# Patient Record
Sex: Male | Born: 1960
Health system: Southern US, Community
[De-identification: ages and names within clinical notes are randomized; demographics above are authoritative.]

## PROBLEM LIST (undated history)

## (undated) DIAGNOSIS — S4991XA Unspecified injury of right shoulder and upper arm, initial encounter: Secondary | ICD-10-CM

## (undated) DIAGNOSIS — J449 Chronic obstructive pulmonary disease, unspecified: Secondary | ICD-10-CM

## (undated) DIAGNOSIS — M1990 Unspecified osteoarthritis, unspecified site: Secondary | ICD-10-CM

## (undated) DIAGNOSIS — S82892A Other fracture of left lower leg, initial encounter for closed fracture: Secondary | ICD-10-CM

## (undated) DIAGNOSIS — J45909 Unspecified asthma, uncomplicated: Secondary | ICD-10-CM

## (undated) DIAGNOSIS — I499 Cardiac arrhythmia, unspecified: Secondary | ICD-10-CM

## (undated) DIAGNOSIS — C259 Malignant neoplasm of pancreas, unspecified: Secondary | ICD-10-CM

## (undated) DIAGNOSIS — M5430 Sciatica, unspecified side: Secondary | ICD-10-CM

## (undated) DIAGNOSIS — J189 Pneumonia, unspecified organism: Secondary | ICD-10-CM

## (undated) DIAGNOSIS — I4891 Unspecified atrial fibrillation: Secondary | ICD-10-CM

## (undated) HISTORY — DX: Pneumonia, unspecified organism: J18.9

## (undated) HISTORY — DX: Chronic obstructive pulmonary disease, unspecified: J44.9

## (undated) HISTORY — DX: Unspecified atrial fibrillation: I48.91

## (undated) HISTORY — DX: Unspecified asthma, uncomplicated: J45.909

## (undated) SURGERY — ERCP, WITH INTERVENTION IF INDICATED
Anesthesia: General | Laterality: Left

---

## 2000-03-29 HISTORY — PX: SHOULDER SURGERY: SHX246

## 2000-03-29 HISTORY — PX: KNEE SURGERY: SHX244

## 2005-07-04 ENCOUNTER — Inpatient Hospital Stay: Payer: Self-pay | Admitting: Otolaryngology

## 2009-03-29 DIAGNOSIS — J189 Pneumonia, unspecified organism: Secondary | ICD-10-CM

## 2009-03-29 HISTORY — DX: Pneumonia, unspecified organism: J18.9

## 2011-02-16 ENCOUNTER — Inpatient Hospital Stay: Payer: Self-pay | Admitting: Internal Medicine

## 2011-02-17 DIAGNOSIS — I059 Rheumatic mitral valve disease, unspecified: Secondary | ICD-10-CM

## 2011-02-17 DIAGNOSIS — I4891 Unspecified atrial fibrillation: Secondary | ICD-10-CM

## 2011-02-18 DIAGNOSIS — J984 Other disorders of lung: Secondary | ICD-10-CM

## 2011-03-17 ENCOUNTER — Ambulatory Visit: Payer: Self-pay | Admitting: "Endocrinology

## 2012-04-18 ENCOUNTER — Ambulatory Visit: Payer: Self-pay | Admitting: Adult Health

## 2012-11-23 DIAGNOSIS — J45909 Unspecified asthma, uncomplicated: Secondary | ICD-10-CM | POA: Insufficient documentation

## 2012-12-08 ENCOUNTER — Ambulatory Visit: Payer: Self-pay | Admitting: Adult Health

## 2013-11-28 ENCOUNTER — Ambulatory Visit: Payer: Self-pay | Admitting: Internal Medicine

## 2014-01-23 LAB — BASIC METABOLIC PANEL
BUN: 5 mg/dL (ref 4–21)
Creatinine: 0.6 mg/dL (ref 0.6–1.3)
Glucose: 93 mg/dL
Potassium: 5 mmol/L (ref 3.4–5.3)
Sodium: 139 mmol/L (ref 137–147)

## 2014-01-23 LAB — CBC AND DIFFERENTIAL
HEMATOCRIT: 52 % (ref 41–53)
HEMOGLOBIN: 18.4 g/dL — AB (ref 13.5–17.5)
NEUTROS ABS: 4 /uL
PLATELETS: 241 10*3/uL (ref 150–399)
WBC: 7.3 10^3/mL

## 2014-01-23 LAB — HEPATIC FUNCTION PANEL
ALK PHOS: 84 U/L (ref 25–125)
ALT: 19 U/L (ref 10–40)
AST: 25 U/L (ref 14–40)
Bilirubin, Total: 0.6 mg/dL

## 2014-01-23 LAB — LIPID PANEL
CHOLESTEROL: 206 mg/dL — AB (ref 0–200)
HDL: 98 mg/dL — AB (ref 35–70)
LDL CALC: 94 mg/dL
TRIGLYCERIDES: 70 mg/dL (ref 40–160)

## 2014-01-23 LAB — TSH: TSH: 0.47 u[IU]/mL (ref 0.41–5.90)

## 2014-07-24 LAB — PSA: PSA: 1.2

## 2014-07-31 ENCOUNTER — Ambulatory Visit: Payer: Self-pay | Admitting: Internal Medicine

## 2014-09-04 ENCOUNTER — Other Ambulatory Visit: Payer: Self-pay

## 2014-09-11 ENCOUNTER — Ambulatory Visit: Payer: Self-pay | Admitting: Internal Medicine

## 2015-01-28 ENCOUNTER — Ambulatory Visit: Payer: Self-pay

## 2015-01-28 DIAGNOSIS — J449 Chronic obstructive pulmonary disease, unspecified: Secondary | ICD-10-CM | POA: Insufficient documentation

## 2015-01-31 ENCOUNTER — Other Ambulatory Visit: Payer: Self-pay | Admitting: Internal Medicine

## 2015-01-31 DIAGNOSIS — R0602 Shortness of breath: Secondary | ICD-10-CM

## 2015-02-14 ENCOUNTER — Ambulatory Visit
Admission: RE | Admit: 2015-02-14 | Discharge: 2015-02-14 | Disposition: A | Discharge status: Home / Self Care | Payer: Self-pay | Source: Ambulatory Visit | Attending: Internal Medicine | Admitting: Internal Medicine

## 2015-02-14 ENCOUNTER — Ambulatory Visit
Admission: RE | Admit: 2015-02-14 | Discharge: 2015-02-14 | Disposition: A | Discharge status: Home / Self Care | Payer: PRIVATE HEALTH INSURANCE | Source: Ambulatory Visit | Attending: Internal Medicine | Admitting: Internal Medicine

## 2015-02-14 DIAGNOSIS — R0602 Shortness of breath: Secondary | ICD-10-CM

## 2015-02-14 DIAGNOSIS — F17200 Nicotine dependence, unspecified, uncomplicated: Secondary | ICD-10-CM | POA: Insufficient documentation

## 2015-02-14 DIAGNOSIS — J449 Chronic obstructive pulmonary disease, unspecified: Secondary | ICD-10-CM | POA: Insufficient documentation

## 2015-03-05 ENCOUNTER — Encounter (INDEPENDENT_AMBULATORY_CARE_PROVIDER_SITE_OTHER): Payer: Self-pay

## 2015-03-05 ENCOUNTER — Ambulatory Visit: Payer: PRIVATE HEALTH INSURANCE

## 2015-09-02 DIAGNOSIS — J45909 Unspecified asthma, uncomplicated: Secondary | ICD-10-CM

## 2015-09-02 DIAGNOSIS — J441 Chronic obstructive pulmonary disease with (acute) exacerbation: Secondary | ICD-10-CM

## 2016-03-30 ENCOUNTER — Encounter: Payer: Self-pay | Admitting: Emergency Medicine

## 2016-03-30 ENCOUNTER — Emergency Department: Payer: BLUE CROSS/BLUE SHIELD

## 2016-03-30 ENCOUNTER — Emergency Department
Admission: EM | Admit: 2016-03-30 | Discharge: 2016-03-30 | Disposition: A | Discharge status: Home / Self Care | Payer: BLUE CROSS/BLUE SHIELD | Attending: Emergency Medicine | Admitting: Emergency Medicine

## 2016-03-30 DIAGNOSIS — F17210 Nicotine dependence, cigarettes, uncomplicated: Secondary | ICD-10-CM | POA: Diagnosis not present

## 2016-03-30 DIAGNOSIS — W1839XA Other fall on same level, initial encounter: Secondary | ICD-10-CM | POA: Diagnosis not present

## 2016-03-30 DIAGNOSIS — J449 Chronic obstructive pulmonary disease, unspecified: Secondary | ICD-10-CM | POA: Diagnosis not present

## 2016-03-30 DIAGNOSIS — J45909 Unspecified asthma, uncomplicated: Secondary | ICD-10-CM | POA: Insufficient documentation

## 2016-03-30 DIAGNOSIS — S86011A Strain of right Achilles tendon, initial encounter: Secondary | ICD-10-CM | POA: Diagnosis not present

## 2016-03-30 DIAGNOSIS — Y939 Activity, unspecified: Secondary | ICD-10-CM | POA: Insufficient documentation

## 2016-03-30 DIAGNOSIS — S4991XA Unspecified injury of right shoulder and upper arm, initial encounter: Secondary | ICD-10-CM | POA: Diagnosis present

## 2016-03-30 DIAGNOSIS — Y999 Unspecified external cause status: Secondary | ICD-10-CM | POA: Insufficient documentation

## 2016-03-30 DIAGNOSIS — Y929 Unspecified place or not applicable: Secondary | ICD-10-CM | POA: Insufficient documentation

## 2016-03-30 DIAGNOSIS — M25512 Pain in left shoulder: Secondary | ICD-10-CM

## 2016-03-30 MED ORDER — NAPROXEN 500 MG PO TABS: 500.0000 mg | ORAL_TABLET | Freq: Two times a day (BID) | ORAL | Status: DC

## 2016-03-30 NOTE — ED Triage Notes (Signed)
Pt to ed with c/o back pain that radiates down right leg after falling on Saturday night.  Pt states he was reaching up and lost his balance and fell backwards.  Pt ambulates with limp at triage.

## 2016-03-30 NOTE — ED Provider Notes (Signed)
[facility] Emergency Department Provider Note   ____________________________________________   First MD Initiated Contact with Patient 03/30/16 1203     (approximate)  I have reviewed the triage vital signs and the nursing notes.   HISTORY  Chief Complaint Fall    HPI Benjamin Diaz. is a 56 y.o. male patient complain of left shoulder pain, back pain, and right leg pain secondary to a fall 3 days ago. Patient state he was reaching upward lost his balance and fell backwards. Patient states since the incident he has had pain with adduction and overhead reaching with her left upper extremity. Patient state pain in this leg did not start until yesterday. He describes the pain as a tenderness in his right calf. Patient stated pain increases with dorsal and plantar flexion of the foot. Patient rates overall pain as a 6/10. Patient had a pain as "achy". Patient denies bladder or bowel dysfunction. Patient denies radicular component to his pain.   Past Medical History:  Diagnosis Date  . Asthma   . Atrial fibrillation ([place])   . COPD (chronic obstructive pulmonary disease) ([place])   . Pneumonia     Patient Active Problem List   Diagnosis Date Noted  . COPD (chronic obstructive pulmonary disease) ([place]) 01/28/2015  . Asthma 11/23/2012    Past Surgical History:  Procedure Laterality Date  . KNEE SURGERY  2004  . SHOULDER SURGERY  2002    Prior to Admission medications   Medication Sig Start Date End Date Taking? Authorizing Provider  albuterol (PROVENTIL HFA) 108 (90 Base) MCG/ACT inhaler Inhale 2 puffs into the lungs every 4 (four) hours as needed for wheezing or shortness of breath.    Historical Provider, MD  budesonide-formoterol (SYMBICORT) 160-4.5 MCG/ACT inhaler Inhale 2 puffs into the lungs 2 (two) times daily.    Historical Provider, MD  naproxen (NAPROSYN) 500 MG tablet Take 1 tablet (500 mg total) by mouth 2 (two) times daily with a meal.  03/30/16   Sable Feil, PA-C    Allergies Patient has no known allergies.  Family History  Problem Relation Age of Onset  . Heart disease Father     Social History Social History  Substance Use Topics  . Smoking status: Current Every Day Smoker    Packs/day: 1.00    Years: 40.00    Types: Cigarettes  . Smokeless tobacco: Never Used  . Alcohol use 4.2 oz/week    7 Cans of beer per week    Review of Systems Constitutional: No fever/chills Eyes: No visual changes. ENT: No sore throat. Cardiovascular: Denies chest pain. Respiratory: Denies shortness of breath. Gastrointestinal: No abdominal pain.  No nausea, no vomiting.  No diarrhea.  No constipation. Genitourinary: Negative for dysuria. Musculoskeletal: Negative for back pain. Skin: Negative for rash. Neurological: Negative for headaches, focal weakness or numbness.    ____________________________________________   PHYSICAL EXAM:  VITAL SIGNS: ED Triage Vitals  Enc Vitals Group     BP 03/30/16 1043 (!) 158/95     Pulse Rate 03/30/16 1043 (!) 107     Resp 03/30/16 1043 20     Temp 03/30/16 1043 97.9 F (36.6 C)     Temp Source 03/30/16 1043 Oral     SpO2 03/30/16 1043 93 %     Weight 03/30/16 1044 125 lb (56.7 kg)     Height 03/30/16 1044 6\' 1"  (1.854 m)     Head Circumference --      Peak Flow --  Pain Score 03/30/16 1049 8     Pain Loc --      Pain Edu? --      Excl. in [place]? --     Constitutional: Alert and oriented. Well appearing and in no acute distress. Eyes: Conjunctivae are normal. PERRL. EOMI. Head: Atraumatic. Nose: No congestion/rhinnorhea. Mouth/Throat: Mucous membranes are moist.  Oropharynx non-erythematous. Neck: No stridor.  No cervical spine tenderness to palpation. Hematological/Lymphatic/Immunilogical: No cervical lymphadenopathy. Cardiovascular: Normal rate, regular rhythm. Grossly normal heart sounds.  Good peripheral circulation. Respiratory: Normal respiratory effort.  No  retractions. Lungs CTAB. Gastrointestinal: Soft and nontender. No distention. No abdominal bruits. No CVA tenderness. Musculoskeletal: No obvious deformity of the left shoulder. Decreased range of motion with abduction overhead reaching. Patient has some moderate guarding palpation of the mid scapular spine. No obvious deformity of the lumbar spine. Patient is nontender with palpation of the lumbar spine. Patient has full nuchal range of motion of the lumbar spine. Examination of the right leg shows no obvious deformity. Patient has some moderate guarding palpation of the calf muscle of the right leg. Patient has atypical gait secondary to complain of pain to the right leg.  Neurologic:  Normal speech and language. No gross focal neurologic deficits are appreciated. No gait instability. Skin:  Skin is warm, dry and intact. No rash noted. Psychiatric: Mood and affect are normal. Speech and behavior are normal.  ____________________________________________   LABS (all labs ordered are listed, but only abnormal results are displayed)  Labs Reviewed - No data to display ____________________________________________  EKG   ____________________________________________  RADIOLOGY  No acute findings x-ray of the left shoulder. ____________________________________________   PROCEDURES  Procedure(s) performed: None  Procedures  Critical Care performed: No  ____________________________________________   INITIAL IMPRESSION / ASSESSMENT AND PLAN / ED COURSE  Pertinent labs & imaging results that were available during my care of the patient were reviewed by me and considered in my medical decision making (see chart for details).  Left shoulder pain secondary to fall. Right Achilles strain secondary to stretching incident. Patient given discharge care instructions. Patient given a prescription for naproxen. Patient given a work note for 2 days. Patient advised follow-up family doctor  condition persists.  Clinical Course   Discussed x-ray finding with patient.   ____________________________________________   FINAL CLINICAL IMPRESSION(S) / ED DIAGNOSES  Final diagnoses:  Acute pain of left shoulder  Strain of right Achilles tendon, initial encounter      NEW MEDICATIONS STARTED DURING THIS VISIT:  New Prescriptions   NAPROXEN (NAPROSYN) 500 MG TABLET    Take 1 tablet (500 mg total) by mouth 2 (two) times daily with a meal.     Note:  This document was prepared using Dragon voice recognition software and may include unintentional dictation errors.    Sable Feil, PA-C 03/30/16 1247    Earleen Newport, MD 03/30/16 [phone]

## 2016-03-30 NOTE — ED Notes (Signed)
Pt reports that he fell Saturday on his back and now is having pain in right side of lower back that radiates into right leg - pt was standing straight and reached for something on a shelf and fell backwards

## 2016-03-30 NOTE — ED Notes (Signed)
Ace wrap applied to right lower leg 

## 2016-05-04 ENCOUNTER — Ambulatory Visit: Payer: Self-pay

## 2016-05-11 ENCOUNTER — Ambulatory Visit: Payer: Self-pay | Admitting: Adult Health Nurse Practitioner

## 2016-05-11 VITALS — BP 165/101 | HR 80 | Wt 117.6 lb

## 2016-05-11 DIAGNOSIS — Z0000 Encounter for general adult medical examination without abnormal findings: Secondary | ICD-10-CM

## 2016-05-11 DIAGNOSIS — M545 Low back pain, unspecified: Secondary | ICD-10-CM | POA: Insufficient documentation

## 2016-05-11 DIAGNOSIS — M5441 Lumbago with sciatica, right side: Secondary | ICD-10-CM

## 2016-05-11 DIAGNOSIS — J441 Chronic obstructive pulmonary disease with (acute) exacerbation: Secondary | ICD-10-CM

## 2016-05-11 DIAGNOSIS — M5442 Lumbago with sciatica, left side: Secondary | ICD-10-CM

## 2016-05-11 DIAGNOSIS — G8929 Other chronic pain: Secondary | ICD-10-CM

## 2016-05-11 MED ORDER — GABAPENTIN 300 MG PO CAPS: 300.0000 mg | ORAL_CAPSULE | Freq: Every day | ORAL | 3 refills | Status: DC

## 2016-05-11 MED ORDER — ALBUTEROL SULFATE (2.5 MG/3ML) 0.083% IN NEBU: 2.5000 mg | INHALATION_SOLUTION | Freq: Four times a day (QID) | RESPIRATORY_TRACT | 12 refills | Status: DC | PRN

## 2016-05-11 MED ORDER — ALBUTEROL SULFATE HFA 108 (90 BASE) MCG/ACT IN AERS: 2.0000 | INHALATION_SPRAY | RESPIRATORY_TRACT | 6 refills | Status: DC | PRN

## 2016-05-11 MED ORDER — AMLODIPINE BESYLATE 5 MG PO TABS: 5.0000 mg | ORAL_TABLET | Freq: Every day | ORAL | 3 refills | Status: DC

## 2016-05-11 NOTE — Progress Notes (Signed)
  Patient: Benjamin Diaz. Male    DOB: 01-Oct-1960   56 y.o.   MRN: [id number] Visit Date: 05/11/2016  Today's Provider: Staci Acosta, NP   Chief Complaint  Patient presents with  . Respiratory Distress   Subjective:    HPI  Pt states that he has pain in low back goes down both legs.   States he has some numbness in his L  leg.  Has not been on gabapentin or anything for leg pain.    Pt states that his respiratory symptoms are worse.  Using Symbicort BID, has run out of albuterol.  States he has a non-productive cough.   No Known Allergies Previous Medications   ALBUTEROL (PROVENTIL HFA) 108 (90 BASE) MCG/ACT INHALER    Inhale 2 puffs into the lungs every 4 (four) hours as needed for wheezing or shortness of breath.   BUDESONIDE-FORMOTEROL (SYMBICORT) 160-4.5 MCG/ACT INHALER    Inhale 2 puffs into the lungs 2 (two) times daily.   NAPROXEN (NAPROSYN) 500 MG TABLET    Take 1 tablet (500 mg total) by mouth 2 (two) times daily with a meal.    Review of Systems  All other systems reviewed and are negative.   Social History  Substance Use Topics  . Smoking status: Current Every Day Smoker    Packs/day: 1.00    Years: 40.00    Types: Cigarettes  . Smokeless tobacco: Never Used  . Alcohol use 4.2 oz/week    7 Cans of beer per week   Objective:   BP (!) 165/101   Pulse 80   Wt 117 lb 9.6 oz (53.3 kg)   BMI 15.52 kg/m   Physical Exam  Constitutional: He is oriented to person, place, and time. He appears well-developed and well-nourished.  HENT:  Head: Normocephalic and atraumatic.  Neck: Neck supple.  Cardiovascular: Normal rate, regular rhythm and normal heart sounds.   Pulmonary/Chest: Effort normal and breath sounds normal.  Abdominal: Soft. Bowel sounds are normal.  Neurological: He is alert and oriented to person, place, and time.  Skin: Skin is warm and dry.  Psychiatric: He has a normal mood and affect.        Assessment & Plan:        LBP  with sciatica:  Gabapentin 300mg  QHS.  Chiropractic clinic.    COPD:  Refill Albuterol.  Nebulizer treatments given.  Continue Symbicort.  Will try to obtain nebulizer machine.   HTN:  Newly diagnosed.  Start Norvasc 5mg .  Low sodium diet.    FU in 2 weeks for BP check with routine labs.           Staci Acosta, NP   [facility]

## 2016-05-25 ENCOUNTER — Other Ambulatory Visit: Payer: Self-pay

## 2016-06-01 ENCOUNTER — Other Ambulatory Visit: Payer: Self-pay

## 2016-06-01 NOTE — Progress Notes (Unsigned)
No complaints

## 2016-06-22 ENCOUNTER — Ambulatory Visit: Payer: Self-pay | Admitting: Adult Health Nurse Practitioner

## 2016-06-22 ENCOUNTER — Other Ambulatory Visit: Payer: Self-pay

## 2016-06-22 VITALS — BP 120/75 | HR 88 | Temp 98.2°F | Wt 117.0 lb

## 2016-06-22 DIAGNOSIS — I10 Essential (primary) hypertension: Secondary | ICD-10-CM

## 2016-06-22 DIAGNOSIS — J449 Chronic obstructive pulmonary disease, unspecified: Secondary | ICD-10-CM

## 2016-06-22 DIAGNOSIS — Z0000 Encounter for general adult medical examination without abnormal findings: Secondary | ICD-10-CM

## 2016-06-22 MED ORDER — GABAPENTIN 300 MG PO CAPS: 300.0000 mg | ORAL_CAPSULE | Freq: Every day | ORAL | 3 refills | Status: DC

## 2016-06-22 MED ORDER — NAPROXEN 500 MG PO TABS: 500.0000 mg | ORAL_TABLET | Freq: Two times a day (BID) | ORAL | 0 refills | Status: DC | PRN

## 2016-06-22 MED ORDER — ALBUTEROL SULFATE HFA 108 (90 BASE) MCG/ACT IN AERS: 2.0000 | INHALATION_SPRAY | RESPIRATORY_TRACT | 6 refills | Status: DC | PRN

## 2016-06-22 NOTE — Progress Notes (Signed)
  Patient: Benjamin Diaz. Male    DOB: 1960-12-31   56 y.o.   MRN: [id number] Visit Date: 06/22/2016  Today's Provider: Staci Acosta, NP   Chief Complaint  Patient presents with  . Medication Refill   Subjective:    HPI     Last visit diagnosed with HTN.  BP was 165/101. At home SBP between 120-125.  Pt states he did not start medications.   Cannot receive rx at [facility] due to having outside rx coverage.   No Known Allergies Previous Medications   ALBUTEROL (PROVENTIL HFA) 108 (90 BASE) MCG/ACT INHALER    Inhale 2 puffs into the lungs every 4 (four) hours as needed for wheezing or shortness of breath.   ALBUTEROL (PROVENTIL) (2.5 MG/3ML) 0.083% NEBULIZER SOLUTION    Take 3 mLs (2.5 mg total) by nebulization every 6 (six) hours as needed for wheezing or shortness of breath.   AMLODIPINE (NORVASC) 5 MG TABLET    Take 1 tablet (5 mg total) by mouth daily.   BUDESONIDE-FORMOTEROL (SYMBICORT) 160-4.5 MCG/ACT INHALER    Inhale 2 puffs into the lungs 2 (two) times daily.   GABAPENTIN (NEURONTIN) 300 MG CAPSULE    Take 1 capsule (300 mg total) by mouth at bedtime.   NAPROXEN (NAPROSYN) 500 MG TABLET    Take 1 tablet (500 mg total) by mouth 2 (two) times daily with a meal.    Review of Systems  All other systems reviewed and are negative.   Social History  Substance Use Topics  . Smoking status: Current Every Day Smoker    Packs/day: 1.00    Years: 40.00    Types: Cigarettes  . Smokeless tobacco: Never Used  . Alcohol use 4.2 oz/week    7 Cans of beer per week   Objective:   BP 120/75 (BP Location: Left Arm, Patient Position: Sitting, Cuff Size: Normal)   Pulse 88   Temp 98.2 F (36.8 C)   Wt 117 lb (53.1 kg)   BMI 15.44 kg/m   Physical Exam  Constitutional: He is oriented to person, place, and time. He appears well-developed and well-nourished.  HENT:  Head: Normocephalic and atraumatic.  Cardiovascular: Normal rate, regular rhythm and normal heart  sounds.   Pulmonary/Chest: Effort normal. He has wheezes.  Neurological: He is alert and oriented to person, place, and time.  Skin: Skin is warm and dry.  Psychiatric: He has a normal mood and affect.        Assessment & Plan:        Routine labs tonight.   HTN:  Will discontinue Norvasc.  Controlled.  Goal BP <140/80  Continue with no medications at this time.  Encourage low salt diet and exercise.    Will send rx for  gabapentin and naproxen to [facility].  Given Combivent inhaler samples x2. Instructed not to pick up albuterol until out.  Combivent 1 puff QID.   3 month FU for routine care.    Staci Acosta, NP   [facility]

## 2016-06-22 NOTE — Addendum Note (Signed)
Addended by: Lurena Nida D on: 06/22/2016 08:14 PM   Modules accepted: Orders

## 2016-06-27 DIAGNOSIS — C259 Malignant neoplasm of pancreas, unspecified: Secondary | ICD-10-CM

## 2016-06-27 HISTORY — DX: Malignant neoplasm of pancreas, unspecified: C25.9

## 2016-07-07 ENCOUNTER — Emergency Department: Payer: BLUE CROSS/BLUE SHIELD

## 2016-07-07 ENCOUNTER — Emergency Department
Admission: EM | Admit: 2016-07-07 | Discharge: 2016-07-07 | Disposition: A | Discharge status: Other Acute Inpatient Hospital | Payer: BLUE CROSS/BLUE SHIELD | Attending: Emergency Medicine | Admitting: Emergency Medicine

## 2016-07-07 ENCOUNTER — Inpatient Hospital Stay (HOSPITAL_COMMUNITY)
Admission: AD | Admit: 2016-07-07 | Discharge: 2016-07-11 | DRG: 444 | Disposition: A | Discharge status: Home / Self Care | Payer: BLUE CROSS/BLUE SHIELD | Source: Other Acute Inpatient Hospital | Attending: Internal Medicine | Admitting: Internal Medicine

## 2016-07-07 ENCOUNTER — Encounter (HOSPITAL_COMMUNITY): Payer: Self-pay | Admitting: Family Medicine

## 2016-07-07 DIAGNOSIS — I4891 Unspecified atrial fibrillation: Secondary | ICD-10-CM | POA: Diagnosis not present

## 2016-07-07 DIAGNOSIS — E861 Hypovolemia: Secondary | ICD-10-CM | POA: Diagnosis present

## 2016-07-07 DIAGNOSIS — R7989 Other specified abnormal findings of blood chemistry: Secondary | ICD-10-CM | POA: Diagnosis not present

## 2016-07-07 DIAGNOSIS — J45909 Unspecified asthma, uncomplicated: Secondary | ICD-10-CM | POA: Diagnosis present

## 2016-07-07 DIAGNOSIS — E43 Unspecified severe protein-calorie malnutrition: Secondary | ICD-10-CM | POA: Diagnosis present

## 2016-07-07 DIAGNOSIS — J438 Other emphysema: Secondary | ICD-10-CM | POA: Diagnosis not present

## 2016-07-07 DIAGNOSIS — F17210 Nicotine dependence, cigarettes, uncomplicated: Secondary | ICD-10-CM | POA: Diagnosis not present

## 2016-07-07 DIAGNOSIS — K838 Other specified diseases of biliary tract: Secondary | ICD-10-CM | POA: Insufficient documentation

## 2016-07-07 DIAGNOSIS — J449 Chronic obstructive pulmonary disease, unspecified: Secondary | ICD-10-CM | POA: Insufficient documentation

## 2016-07-07 DIAGNOSIS — E871 Hypo-osmolality and hyponatremia: Secondary | ICD-10-CM | POA: Insufficient documentation

## 2016-07-07 DIAGNOSIS — Z23 Encounter for immunization: Secondary | ICD-10-CM

## 2016-07-07 DIAGNOSIS — K869 Disease of pancreas, unspecified: Secondary | ICD-10-CM | POA: Diagnosis not present

## 2016-07-07 DIAGNOSIS — E222 Syndrome of inappropriate secretion of antidiuretic hormone: Secondary | ICD-10-CM | POA: Diagnosis not present

## 2016-07-07 DIAGNOSIS — E876 Hypokalemia: Secondary | ICD-10-CM | POA: Diagnosis present

## 2016-07-07 DIAGNOSIS — K8689 Other specified diseases of pancreas: Secondary | ICD-10-CM

## 2016-07-07 DIAGNOSIS — K8050 Calculus of bile duct without cholangitis or cholecystitis without obstruction: Secondary | ICD-10-CM

## 2016-07-07 DIAGNOSIS — R945 Abnormal results of liver function studies: Secondary | ICD-10-CM | POA: Diagnosis present

## 2016-07-07 DIAGNOSIS — R17 Unspecified jaundice: Secondary | ICD-10-CM | POA: Diagnosis present

## 2016-07-07 DIAGNOSIS — D72829 Elevated white blood cell count, unspecified: Secondary | ICD-10-CM | POA: Diagnosis present

## 2016-07-07 DIAGNOSIS — C259 Malignant neoplasm of pancreas, unspecified: Secondary | ICD-10-CM | POA: Diagnosis present

## 2016-07-07 DIAGNOSIS — I10 Essential (primary) hypertension: Secondary | ICD-10-CM | POA: Insufficient documentation

## 2016-07-07 DIAGNOSIS — Z8249 Family history of ischemic heart disease and other diseases of the circulatory system: Secondary | ICD-10-CM

## 2016-07-07 DIAGNOSIS — K831 Obstruction of bile duct: Principal | ICD-10-CM

## 2016-07-07 LAB — CBC
HCT: 40.1 % (ref 40.0–52.0)
HEMOGLOBIN: 13.8 g/dL (ref 13.0–18.0)
MCH: 32.8 pg (ref 26.0–34.0)
MCHC: 34.5 g/dL (ref 32.0–36.0)
MCV: 95 fL (ref 80.0–100.0)
Platelets: 408 10*3/uL (ref 150–440)
RBC: 4.21 MIL/uL — AB (ref 4.40–5.90)
RDW: 16.5 % — ABNORMAL HIGH (ref 11.5–14.5)
WBC: 13.4 10*3/uL — AB (ref 3.8–10.6)

## 2016-07-07 LAB — PROTIME-INR
INR: 0.95
INR: 0.95
Prothrombin Time: 12.7 seconds (ref 11.4–15.2)
Prothrombin Time: 12.7 seconds (ref 11.4–15.2)

## 2016-07-07 LAB — URINALYSIS, COMPLETE (UACMP) WITH MICROSCOPIC
Bacteria, UA: NONE SEEN
GLUCOSE, UA: NEGATIVE mg/dL
HGB URINE DIPSTICK: NEGATIVE
Ketones, ur: NEGATIVE mg/dL
Leukocytes, UA: NEGATIVE
NITRITE: NEGATIVE
PH: 6 (ref 5.0–8.0)
Protein, ur: NEGATIVE mg/dL
RBC / HPF: NONE SEEN RBC/hpf (ref 0–5)
SPECIFIC GRAVITY, URINE: 1.006 (ref 1.005–1.030)

## 2016-07-07 LAB — COMPREHENSIVE METABOLIC PANEL
ALBUMIN: 2.8 g/dL — AB (ref 3.5–5.0)
ALK PHOS: 779 U/L — AB (ref 38–126)
ALT: 274 U/L — AB (ref 17–63)
AST: 198 U/L — ABNORMAL HIGH (ref 15–41)
Anion gap: 10 (ref 5–15)
BUN: 8 mg/dL (ref 6–20)
CALCIUM: 9.1 mg/dL (ref 8.9–10.3)
CHLORIDE: 86 mmol/L — AB (ref 101–111)
CO2: 29 mmol/L (ref 22–32)
Glucose, Bld: 147 mg/dL — ABNORMAL HIGH (ref 65–99)
Potassium: 3 mmol/L — ABNORMAL LOW (ref 3.5–5.1)
SODIUM: 125 mmol/L — AB (ref 135–145)
TOTAL PROTEIN: 6.3 g/dL — AB (ref 6.5–8.1)
Total Bilirubin: 35.4 mg/dL (ref 0.3–1.2)

## 2016-07-07 LAB — BASIC METABOLIC PANEL
Anion gap: 8 (ref 5–15)
BUN: 8 mg/dL (ref 6–20)
CHLORIDE: 89 mmol/L — AB (ref 101–111)
CO2: 31 mmol/L (ref 22–32)
Calcium: 9 mg/dL (ref 8.9–10.3)
Creatinine, Ser: <0.3 mg/dL — ABNORMAL LOW (ref 0.61–1.24)
Glucose, Bld: 92 mg/dL (ref 65–99)
POTASSIUM: 3.4 mmol/L — AB (ref 3.5–5.1)
SODIUM: 128 mmol/L — AB (ref 135–145)

## 2016-07-07 LAB — BILIRUBIN, DIRECT: BILIRUBIN DIRECT: 22.4 mg/dL — AB (ref 0.1–0.5)

## 2016-07-07 LAB — ACETAMINOPHEN LEVEL

## 2016-07-07 MED ORDER — ALBUTEROL SULFATE HFA 108 (90 BASE) MCG/ACT IN AERS: 2.0000 | INHALATION_SPRAY | RESPIRATORY_TRACT | Status: DC | PRN

## 2016-07-07 MED ORDER — POTASSIUM CHLORIDE IN NACL 40-0.9 MEQ/L-% IV SOLN
INTRAVENOUS | Status: AC
  Administered 2016-07-07: 100 mL/h via INTRAVENOUS
  Filled 2016-07-07 (×2): qty 1000

## 2016-07-07 MED ORDER — NICOTINE 21 MG/24HR TD PT24
21.0000 mg | MEDICATED_PATCH | Freq: Every day | TRANSDERMAL | Status: DC
  Administered 2016-07-07 – 2016-07-11 (×5): 21 mg via TRANSDERMAL
  Filled 2016-07-07 (×5): qty 1

## 2016-07-07 MED ORDER — SODIUM CHLORIDE 0.9 % IV SOLN
Freq: Once | INTRAVENOUS | Status: AC
  Administered 2016-07-07: 13:00:00 via INTRAVENOUS

## 2016-07-07 MED ORDER — PNEUMOCOCCAL VAC POLYVALENT 25 MCG/0.5ML IJ INJ
0.5000 mL | INJECTION | INTRAMUSCULAR | Status: AC
  Administered 2016-07-10: 0.5 mL via INTRAMUSCULAR
  Filled 2016-07-07 (×2): qty 0.5

## 2016-07-07 MED ORDER — ONDANSETRON HCL 4 MG PO TABS: 4.0000 mg | ORAL_TABLET | Freq: Four times a day (QID) | ORAL | Status: DC | PRN

## 2016-07-07 MED ORDER — ALBUTEROL SULFATE (2.5 MG/3ML) 0.083% IN NEBU: 2.5000 mg | INHALATION_SOLUTION | RESPIRATORY_TRACT | Status: DC | PRN

## 2016-07-07 MED ORDER — PIPERACILLIN-TAZOBACTAM 3.375 G IVPB
3.3750 g | Freq: Once | INTRAVENOUS | Status: AC
  Administered 2016-07-07: 3.375 g via INTRAVENOUS
  Filled 2016-07-07: qty 50

## 2016-07-07 MED ORDER — MAGNESIUM SULFATE IN D5W 1-5 GM/100ML-% IV SOLN
1.0000 g | Freq: Once | INTRAVENOUS | Status: AC
  Administered 2016-07-07: 1 g via INTRAVENOUS
  Filled 2016-07-07: qty 100

## 2016-07-07 MED ORDER — PIPERACILLIN-TAZOBACTAM 3.375 G IVPB
3.3750 g | Freq: Three times a day (TID) | INTRAVENOUS | Status: DC
  Administered 2016-07-07 – 2016-07-10 (×8): 3.375 g via INTRAVENOUS
  Filled 2016-07-07 (×12): qty 50

## 2016-07-07 MED ORDER — MOMETASONE FURO-FORMOTEROL FUM 200-5 MCG/ACT IN AERO
2.0000 | INHALATION_SPRAY | Freq: Two times a day (BID) | RESPIRATORY_TRACT | Status: DC
  Administered 2016-07-07 – 2016-07-11 (×7): 2 via RESPIRATORY_TRACT
  Filled 2016-07-07: qty 8.8

## 2016-07-07 MED ORDER — HEPARIN SODIUM (PORCINE) 5000 UNIT/ML IJ SOLN
5000.0000 [IU] | Freq: Three times a day (TID) | INTRAMUSCULAR | Status: DC
  Administered 2016-07-07 – 2016-07-11 (×9): 5000 [IU] via SUBCUTANEOUS
  Filled 2016-07-07 (×9): qty 1

## 2016-07-07 MED ORDER — OXYCODONE HCL 5 MG PO TABS
5.0000 mg | ORAL_TABLET | ORAL | Status: DC | PRN
  Administered 2016-07-08: 5 mg via ORAL
  Administered 2016-07-09: 10 mg via ORAL
  Filled 2016-07-07: qty 2
  Filled 2016-07-07: qty 1

## 2016-07-07 MED ORDER — ONDANSETRON HCL 4 MG/2ML IJ SOLN
4.0000 mg | Freq: Four times a day (QID) | INTRAMUSCULAR | Status: DC | PRN
  Administered 2016-07-09: 4 mg via INTRAVENOUS

## 2016-07-07 NOTE — ED Triage Notes (Signed)
Pt presents to ED from home c/o " my skin getting yellow" since taking norvasc and gabapentin from April 1st. Also reports back pain near scapula.

## 2016-07-07 NOTE — ED Provider Notes (Signed)
[facility] Emergency Department Provider Note       Time seen: ----------------------------------------- 12:40 PM on 07/07/2016 -----------------------------------------     I have reviewed the triage vital signs and the nursing notes.   HISTORY   Chief Complaint Jaundice and Back Pain    HPI Benjamin Diaz. is a 56 y.o. male who presents to the ED for yellow discoloration to his skin. Patient states his skin has been turning yellow since the beginning of April. Patient states he had started taking Norvasc and gabapentin in late March. Patient states he began to get jaundice so he stopped taking them several days later. It has now been over a week since he stopped taking the medication and his skin discoloration is gotten worse. He does complain of some upper back pain but otherwise denies complaints. He has not had fever, chills, vomiting or abdominal pain. Patient reports to drinking beer daily.   Past Medical History:  Diagnosis Date  . Asthma   . Atrial fibrillation ([place])   . COPD (chronic obstructive pulmonary disease) ([place])   . Pneumonia     Patient Active Problem List   Diagnosis Date Noted  . Hypertension 06/22/2016  . Low back pain 05/11/2016  . COPD (chronic obstructive pulmonary disease) ([place]) 01/28/2015  . Asthma 11/23/2012    Past Surgical History:  Procedure Laterality Date  . KNEE SURGERY  2004  . SHOULDER SURGERY  2002    Allergies Patient has no known allergies.  Social History Social History  Substance Use Topics  . Smoking status: Current Every Day Smoker    Packs/day: 1.00    Years: 40.00    Types: Cigarettes  . Smokeless tobacco: Never Used  . Alcohol use 4.2 oz/week    7 Cans of beer per week    Review of Systems Constitutional: Negative for fever. Cardiovascular: Negative for chest pain. Respiratory: Negative for shortness of breath. Gastrointestinal: Negative for abdominal pain, vomiting and  diarrhea. Musculoskeletal: Positive for midline upper back pain Skin: Positive for yellow discoloration to the skin Neurological: Negative for headaches, focal weakness or numbness.  10-point ROS otherwise negative.  ____________________________________________   PHYSICAL EXAM:  VITAL SIGNS: ED Triage Vitals [07/07/16 1209]  Enc Vitals Group     BP 139/86     Pulse Rate 94     Resp 19     Temp 98.8 F (37.1 C)     Temp Source Oral     SpO2 96 %     Weight 117 lb (53.1 kg)     Height 6' (1.829 m)     Head Circumference      Peak Flow      Pain Score      Pain Loc      Pain Edu?      Excl. in [place]?     Constitutional: Alert and oriented. Thin, no distress Eyes: Conjunctivae Are yellow. PERRL. Normal extraocular movements. ENT   Head: Normocephalic and atraumatic.   Nose: No congestion/rhinnorhea.   Mouth/Throat: Mucous membranes are moist.   Neck: No stridor. Cardiovascular: Normal rate, regular rhythm. No murmurs, rubs, or gallops. Respiratory: Normal respiratory effort without tachypnea nor retractions. Breath sounds are clear and equal bilaterally. No wheezes/rales/rhonchi. Gastrointestinal: Soft and nontender. Normal bowel sounds Musculoskeletal: Nontender with normal range of motion in extremities. No lower extremity tenderness nor edema. Neurologic:  Normal speech and language. No gross focal neurologic deficits are appreciated.  Skin:  Severe jaundice is noted diffusely  Psychiatric: Mood and affect are normal. Speech and behavior are normal.  ____________________________________________  ED COURSE:  Pertinent labs & imaging results that were available during my care of the patient were reviewed by me and considered in my medical decision making (see chart for details). Patient presents for jaundice and back pain, we will assess with labs and imaging as indicated.   Procedures ____________________________________________   LABS (pertinent  positives/negatives)  Labs Reviewed  CBC - Abnormal; Notable for the following:       Result Value   WBC 13.4 (*)    RBC 4.21 (*)    RDW 16.5 (*)    All other components within normal limits  COMPREHENSIVE METABOLIC PANEL - Abnormal; Notable for the following:    Sodium 125 (*)    Potassium 3.0 (*)    Chloride 86 (*)    Glucose, Bld 147 (*)    Total Protein 6.3 (*)    Albumin 2.8 (*)    AST 198 (*)    ALT 274 (*)    Alkaline Phosphatase 779 (*)    Total Bilirubin 35.4 (*)    All other components within normal limits  URINALYSIS, COMPLETE (UACMP) WITH MICROSCOPIC  BILIRUBIN, DIRECT  PROTIME-INR    RADIOLOGY Images were viewed by me  Right upper quadrant ultrasound IMPRESSION: Distended gallbladder with slight gallbladder wall thickening. There may be early acalculus cholecystitis. There is generalized intrahepatic and extrahepatic biliary duct dilatation without mass or calculus evident. Etiology uncertain. This finding may warrant MRCP to further assess. No parenchymal liver lesions evident. ____________________________________________  FINAL ASSESSMENT AND PLAN  Hyperbilirubinemia, hyponatremia, back pain  Plan: Patient's labs and imaging were dictated above. Patient had presented for Jaundice and was found to have a markedly elevated bilirubin level with LFT elevation and abnormal ultrasound.  Unfortunately we have no gastric neurology coverage today, patient will require transfer to [place], MD   Note: This note was generated in part or whole with voice recognition software. Voice recognition is usually quite accurate but there are transcription errors that can and very often do occur. I apologize for any typographical errors that were not detected and corrected.     Earleen Newport, MD 07/07/16 [phone]

## 2016-07-07 NOTE — Progress Notes (Addendum)
Patient  presents with jaundice, found to have bili at 35, transaminases, [place]; distended gallbladder, with slight gallbladder wall thickening. There maybe early acalculous cholecystitis. There is generalized intrahepatic and extrahepatic biliary duct dilatation without mass or calculus evident. Etiology uncertain. MRCP recommended.  -Patient will received IV antibiotics.[facility] consulted, who accept to see patient in consultation. Please inform [facility] when patient arrives to [place] [facility].. -Per report patient stable for Med-surgery  Mardell Cragg.

## 2016-07-07 NOTE — ED Notes (Addendum)
Pt in route with [facility]. Report called to [place] room 1338 to [facility], RN.

## 2016-07-07 NOTE — H&P (Signed)
History and Physical    Benjamin Diaz. NFA:[id number] DOB: 1960/06/11 DOA: 07/07/2016  PCP: Boyce Medici, FNP   Patient coming from: Home  Chief Complaint: Jaundice, painless   HPI: Benjamin Diaz. is a 56 y.o. male with medical history significant for tobacco abuse, COPD, and chronic back pain who presents in transfer from [place] with painless jaundice. Patient reports that he had been in his usual state of health until approximately 10 days ago when he noted the development of jaundice. Since that time, he reports becoming increasingly yellow. He denies any fevers or chills and denies any abdominal pain, nausea, vomiting, or diarrhea. He had some mild malaise and mild headache a few days ago for which she took Tylenol, but never exceeding the recommended dose. He reports drinking 2-3 beers nightly for years, but never more than this. He currently smokes 1 pack per day, reportedly having cut back from more than 2 packs per day previously. He had been started on gabapentin in late March and thought that this may be the reason for his jaundice, so he stopped that several days ago. He had also been on Norvasc previously and stopped this as well. He denies any history of IV drug abuse, but is noted to have some tattoos. He denies melena or hematochezia. Denies indigestion or early satiety. No steatorrhea.  [facility] ED Course: Upon arrival to the ED, patient is found to be afebrile, saturating well on room air, and with vital signs otherwise stable. Chemistry panels notable for a sodium of 125, potassium 3.0, alkaline phosphatase 780, albumin 2.8, AST 198, ALT 274, and total bilirubin 35.4. CBC is notable for a leukocytosis to 13,400 and INR is within the normal limits. Urinalysis is unremarkable. Ultrasound of the right upper quadrant was obtained and notable for distended gallbladder with some wall thickening, possibly reflecting an early acalculous cholecystitis. Also noted on ultrasound is  generalized intra-and extrahepatic biliary ductal dilation without mass or calculus. With no gastroenterology services available at the outside hospital, [facility] was consulted and accepted the patient in consultation. Transferred to [facility] for medical admission and GI consultation was arranged.  Patient is examined and interviewed on the medical surgical unit where he remains hemodynamically stable and not in any acute distress. He is admitted for ongoing evaluation and management of painless jaundice.  Review of Systems:  All other systems reviewed and apart from HPI, are negative.  Past Medical History:  Diagnosis Date  . Asthma   . Atrial fibrillation ([place])   . COPD (chronic obstructive pulmonary disease) ([place])   . Pneumonia     Past Surgical History:  Procedure Laterality Date  . KNEE SURGERY  2004  . SHOULDER SURGERY  2002     reports that he has been smoking Cigarettes.  He has a 40.00 pack-year smoking history. He has never used smokeless tobacco. He reports that he drinks about 4.2 oz of alcohol per week . His drug history is not on file.  No Known Allergies  Family History  Problem Relation Age of Onset  . Heart disease Father      Prior to Admission medications   Medication Sig Start Date End Date Taking? Authorizing Provider  albuterol (PROVENTIL HFA) 108 (90 Base) MCG/ACT inhaler Inhale 2 puffs into the lungs every 4 (four) hours as needed for wheezing or shortness of breath. 06/22/16   Teah Doles-Winborne, NP  albuterol (PROVENTIL) (2.5 MG/3ML) 0.083% nebulizer solution Take 3 mLs (2.5 mg  total) by nebulization every 6 (six) hours as needed for wheezing or shortness of breath. Patient not taking: Reported on 07/07/2016 05/11/16   Teah Doles-Grimmer, NP  budesonide-formoterol (SYMBICORT) 160-4.5 MCG/ACT inhaler Inhale 2 puffs into the lungs 2 (two) times daily.    Historical Provider, MD  gabapentin (NEURONTIN) 300 MG capsule Take 1 capsule (300 mg total)  by mouth at bedtime. 06/22/16   Teah Doles-[place]a, NP  naproxen (NAPROSYN) 500 MG tablet Take 1 tablet (500 mg total) by mouth 2 (two) times daily as needed. 06/22/16   Teah Doles-Ogborn, NP    Physical Exam: Vitals:   07/07/16 1842  BP: 107/87  Pulse: 78  Resp: 16  Temp: 98.2 F (36.8 C)  TempSrc: Oral  SpO2: 98%  Weight: 53.1 kg (117 lb)  Height: 6' (1.829 m)      Constitutional: NAD, calm. Very thin, jaundiced.  Eyes: PERTLA, lids normal. Scleral icterus ENMT: Mucous membranes are moist. Posterior pharynx clear of any exudate or lesions.   Neck: normal, supple, no masses, no thyromegaly Respiratory: clear to auscultation bilaterally, no wheezing, no crackles. Normal respiratory effort.   Cardiovascular: S1 & S2 heard, regular rate and rhythm. No extremity edema. No significant JVD. Abdomen: No distension, very mild RUQ tenderness without rebound pain or guarding. Bowel sounds normal.  Musculoskeletal: no clubbing / cyanosis. No joint deformity upper and lower extremities.   Skin: no significant rashes, lesions, ulcers. Warm, dry, well-perfused. Jaundice throughout.  Neurologic: CN 2-12 grossly intact. Sensation intact, DTR normal. Strength 5/5 in all 4 limbs.  Psychiatric: Alert and oriented x 3. Normal mood and affect.     Labs on Admission: I have personally reviewed following labs and imaging studies  CBC:  Recent Labs Lab 07/07/16 1211  WBC 13.4*  HGB 13.8  HCT 40.1  MCV 95.0  PLT 630   Basic Metabolic Panel:  Recent Labs Lab 07/07/16 1211  NA 125*  K 3.0*  CL 86*  CO2 29  GLUCOSE 147*  BUN 8  CREATININE NOT CALCULATED  CALCIUM 9.1   GFR: CrCl cannot be calculated (This lab value cannot be used to calculate CrCl because it is not a number: NOT CALCULATED). Liver Function Tests:  Recent Labs Lab 07/07/16 1211  AST 198*  ALT 274*  ALKPHOS 779*  BILITOT 35.4*  PROT 6.3*  ALBUMIN 2.8*   No results for input(s): LIPASE, AMYLASE in the last  168 hours. No results for input(s): AMMONIA in the last 168 hours. Coagulation Profile:  Recent Labs Lab 07/07/16 1351  INR 0.95   Cardiac Enzymes: No results for input(s): CKTOTAL, CKMB, CKMBINDEX, TROPONINI in the last 168 hours. BNP (last 3 results) No results for input(s): PROBNP in the last 8760 hours. HbA1C: No results for input(s): HGBA1C in the last 72 hours. CBG: No results for input(s): GLUCAP in the last 168 hours. Lipid Profile: No results for input(s): CHOL, HDL, LDLCALC, TRIG, CHOLHDL, LDLDIRECT in the last 72 hours. Thyroid Function Tests: No results for input(s): TSH, T4TOTAL, FREET4, T3FREE, THYROIDAB in the last 72 hours. Anemia Panel: No results for input(s): VITAMINB12, FOLATE, FERRITIN, TIBC, IRON, RETICCTPCT in the last 72 hours. Urine analysis:    Component Value Date/Time   COLORURINE AMBER (A) 07/07/2016 1351   APPEARANCEUR CLEAR (A) 07/07/2016 1351   LABSPEC 1.006 07/07/2016 1351   PHURINE 6.0 07/07/2016 1351   GLUCOSEU NEGATIVE 07/07/2016 1351   HGBUR NEGATIVE 07/07/2016 1351   BILIRUBINUR MODERATE (A) 07/07/2016 1351   KETONESUR NEGATIVE 07/07/2016  [place] 07/07/2016 1351   NITRITE NEGATIVE 07/07/2016 1351   LEUKOCYTESUR NEGATIVE 07/07/2016 1351   Sepsis Labs: @LABRCNTIP (procalcitonin:4,lacticidven:4) )No results found for this or any previous visit (from the past 240 hour(s)).   Radiological Exams on Admission: US Abdomen Limited Ruq  Result Date: 07/07/2016 CLINICAL DATA:  Jaundice and pain EXAM: US ABDOMEN LIMITED - RIGHT UPPER QUADRANT COMPARISON:  None. FINDINGS: Gallbladder: The gallbladder is distended with borderline wall thickening. No gallstones or pericholecystic fluid evident. No sonographic Murphy sign noted by sonographer. Common bile duct: Diameter: 15 mm common distended. There is generalized intrahepatic biliary duct dilatation. There is common hepatic and common bile duct dilatation without evident mass or  calculus. No obvious pancreatic head lesion seen by ultrasound. Liver: No focal lesion identified. Within normal limits in parenchymal echogenicity. There is intrahepatic biliary duct dilatation. IMPRESSION: Distended gallbladder with slight gallbladder wall thickening. There may be early acalculus cholecystitis. There is generalized intrahepatic and extrahepatic biliary duct dilatation without mass or calculus evident. Etiology uncertain. This finding may warrant MRCP to further assess. No parenchymal liver lesions evident. Electronically Signed   By: Lowella Grip III M.D.   On: 07/07/2016 13:54    EKG: Not performed, will obtain as needed.  Assessment/Plan  1. Painless jaundice  - Pt presents with ~10 days progressive jaundice; denies fevers/chills, abd pain, N/V, or diarrhea - Labs with bili 35, alk phos 780, AST 200, ALT 275; INR wnl; renal fxn wnl; APAP level wnl; viral hep panel pending  - RUQ [place] with GB distension and wall-thickening, possibly reflecting early acalculous cholecystitis; generalized intra- and extra-hepatic ductal dilation also noted without apparent mass or calculus  - MRCP ordered for further evaluation; Zosyn started empirically with concern for possible early cholecystitis - GI is consulting and much appreciated, will follow-up recommendations   2. Hyponatremia  - Serum sodium is 125 on admission, likely secondary to liver disease and hypovolemia   - Continue NS infusion, repeat chem panel in am    3. Hypokalemia  - Serum potassium 3.0 on admission  - KCl added to IVF and 1 g IV magnesium given empirically - Repeat chem panel in am   4. Current smoker  - Counseled toward cessation  - RN asked to provide smoking-cessation information prior to discharge  - Nicotine patch provided    5. COPD  - Stable  - Continue Dulera BID in place of Symbicort  - Continue prn albuterol   DVT prophylaxis: sq heparin  Code Status: Full  Family Communication: Discussed  with patient Disposition Plan: Admit to med-surg Consults called: Gastroenterology  Admission status: Inpatient    Vianne Bulls, MD [facility] Pager [phone]  If 7PM-7AM, please contact night-coverage [email or web address] Password [facility]  07/07/2016, 7:37 PM

## 2016-07-08 ENCOUNTER — Inpatient Hospital Stay (HOSPITAL_COMMUNITY): Payer: BLUE CROSS/BLUE SHIELD

## 2016-07-08 DIAGNOSIS — E876 Hypokalemia: Secondary | ICD-10-CM

## 2016-07-08 DIAGNOSIS — R7989 Other specified abnormal findings of blood chemistry: Secondary | ICD-10-CM

## 2016-07-08 DIAGNOSIS — E871 Hypo-osmolality and hyponatremia: Secondary | ICD-10-CM

## 2016-07-08 DIAGNOSIS — J438 Other emphysema: Secondary | ICD-10-CM

## 2016-07-08 DIAGNOSIS — R17 Unspecified jaundice: Secondary | ICD-10-CM

## 2016-07-08 LAB — CBC
HCT: 32.8 % — ABNORMAL LOW (ref 39.0–52.0)
Hemoglobin: 11.8 g/dL — ABNORMAL LOW (ref 13.0–17.0)
MCH: 32.2 pg (ref 26.0–34.0)
MCHC: 36 g/dL (ref 30.0–36.0)
MCV: 89.6 fL (ref 78.0–100.0)
PLATELETS: 447 10*3/uL — AB (ref 150–400)
RBC: 3.66 MIL/uL — AB (ref 4.22–5.81)
RDW: 17.9 % — ABNORMAL HIGH (ref 11.5–15.5)
WBC: 10.2 10*3/uL (ref 4.0–10.5)

## 2016-07-08 LAB — COMPREHENSIVE METABOLIC PANEL
ALBUMIN: 2.6 g/dL — AB (ref 3.5–5.0)
ALT: 274 U/L — AB (ref 17–63)
AST: 231 U/L — AB (ref 15–41)
Alkaline Phosphatase: 711 U/L — ABNORMAL HIGH (ref 38–126)
Anion gap: 9 (ref 5–15)
BUN: 8 mg/dL (ref 6–20)
CO2: 30 mmol/L (ref 22–32)
Calcium: 8.8 mg/dL — ABNORMAL LOW (ref 8.9–10.3)
Chloride: 91 mmol/L — ABNORMAL LOW (ref 101–111)
Glucose, Bld: 100 mg/dL — ABNORMAL HIGH (ref 65–99)
POTASSIUM: 3 mmol/L — AB (ref 3.5–5.1)
SODIUM: 130 mmol/L — AB (ref 135–145)
Total Bilirubin: 31.2 mg/dL (ref 0.3–1.2)
Total Protein: 5.7 g/dL — ABNORMAL LOW (ref 6.5–8.1)

## 2016-07-08 LAB — GLUCOSE, CAPILLARY: GLUCOSE-CAPILLARY: 95 mg/dL (ref 65–99)

## 2016-07-08 LAB — HIV ANTIBODY (ROUTINE TESTING W REFLEX): HIV SCREEN 4TH GENERATION: NONREACTIVE

## 2016-07-08 LAB — SODIUM, URINE, RANDOM: SODIUM UR: 131 mmol/L

## 2016-07-08 LAB — MAGNESIUM: Magnesium: 1.9 mg/dL (ref 1.7–2.4)

## 2016-07-08 LAB — LIPASE, BLOOD: LIPASE: 43 U/L (ref 11–51)

## 2016-07-08 LAB — CREATININE, URINE, RANDOM: Creatinine, Urine: 38.46 mg/dL

## 2016-07-08 LAB — OSMOLALITY, URINE: Osmolality, Ur: 454 mOsm/kg (ref 300–900)

## 2016-07-08 MED ORDER — POTASSIUM CHLORIDE IN NACL 40-0.9 MEQ/L-% IV SOLN
INTRAVENOUS | Status: DC
  Administered 2016-07-08 – 2016-07-10 (×4): 75 mL/h via INTRAVENOUS
  Filled 2016-07-08 (×5): qty 1000

## 2016-07-08 MED ORDER — GADOBENATE DIMEGLUMINE 529 MG/ML IV SOLN
10.0000 mL | Freq: Once | INTRAVENOUS | Status: AC | PRN
  Administered 2016-07-08: 10 mL via INTRAVENOUS

## 2016-07-08 MED ORDER — SODIUM CHLORIDE 0.9 % IV SOLN: INTRAVENOUS | Status: DC

## 2016-07-08 MED ORDER — CIPROFLOXACIN IN D5W 400 MG/200ML IV SOLN
400.0000 mg | Freq: Once | INTRAVENOUS | Status: AC
  Administered 2016-07-09: 400 mg via INTRAVENOUS
  Filled 2016-07-08: qty 200

## 2016-07-08 NOTE — Progress Notes (Signed)
PROGRESS NOTE  Benjamin Diaz. SJG:[id number] DOB: [birth year] DOA: 07/07/2016 PCP: Boyce Medici, FNP  Brief History:  56 year old male with a history of COPD, tobacco abuse, and alcohol dependence who presented in transfer from [place] with painless jaundice. Patient reports that he had been in his usual state of health until approximately 10 days ago when he noted the development of jaundice. Since that time, he reports becoming increasingly yellow. He denies any fevers or chills and denies any abdominal pain, nausea, vomiting, or diarrhea.  The patient initially thought this may have been partly due to gabapentin and amlodipine that he recently started. He discontinued these medications without improvement. He denies any other illegal or over-the-counter drug use. In the emergency department, the patient was noted to have elevated transaminases and a bilirubin of 35.4. Ultrasound of the right upper quadrant showed a distended gallbladder with some wall thickening with dilated intra-and extrahepatic biliary ducts. The patient was transferred to [facility] for further GI work up  Assessment/Plan: Hyperbilirubinemia/jaundice -07/07/2016 RUQ US--distended gallbladder with slightly wall thickening possibly reflecting a calculus cholecystitis. Generalized intrahepatic and extrahepatic ductal dilatation. -appreciate GI consult -suspect pt has underlying chronic liver disease superimposed upon his acute medical situation -MRCP -consult general surgery -check lipase -continue empiric zosyn  Transaminasemia -hep B surface antigen -hep C antibody -HIV  Hyponatremia -due to volume depletion and likely underlying chronic hepatic disease -Continue IV fluids -serum osm -urine osm -urine Na -urine creatinine -some improvement with IVF  Hypokalemia -Replete -Check mag  COPD/tobacco abuse -tobacco cessation discussed -continue LABA  Disposition Plan:   Not stable for d/c Family  Communication:   No Family at bedside  Consultants:  [facility], general surgery  Code Status:  FULL   DVT Prophylaxis:  [place] Heparin    Procedures: As Listed in Progress Note Above  Antibiotics: Zosyn 07/07/16>>>    Subjective: Patient denies fevers, chills, headache, chest pain, dyspnea, nausea, vomiting, diarrhea, abdominal pain, dysuria, hematuria, hematochezia, and melena.   Objective: Vitals:   07/07/16 2045 07/08/16 0546 07/08/16 0553 07/08/16 0856  BP: 128/68 111/74    Pulse: 73 82    Resp: 16 14    Temp: 97.9 F (36.6 C) 98.4 F (36.9 C)    TempSrc: Oral Oral    SpO2: 99% 96%  97%  Weight:   53.1 kg (117 lb)   Height:        Intake/Output Summary (Last 24 hours) at 07/08/16 1120 Last data filed at 07/07/16 2015  Gross per 24 hour  Intake              240 ml  Output                0 ml  Net              240 ml   Weight change:  Exam:   General:  Pt is alert, follows commands appropriately, not in acute distress  HEENT: No icterus, No thrush, No neck mass, [place]/AT  Cardiovascular: RRR, S1/S2, no rubs, no gallops  Respiratory: CTA bilaterally, no wheezing, no crackles, no rhonchi  Abdomen: Soft/+BS, epigastric tender, non distended, no guarding  Extremities: No edema, No lymphangitis, No petechiae, No rashes, no synovitis   Data Reviewed: I have personally reviewed following labs and imaging studies Basic Metabolic Panel:  Recent Labs Lab 07/07/16 1211 07/07/16 1933 07/08/16 0425  NA 125* 128* 130*  K 3.0* 3.4*  3.0*  CL 86* 89* 91*  CO2 29 31 30   GLUCOSE 147* 92 100*  BUN 8 8 8   CREATININE NOT CALCULATED <0.30* <0.30*  CALCIUM 9.1 9.0 8.8*   Liver Function Tests:  Recent Labs Lab 07/07/16 1211 07/08/16 0425  AST 198* 231*  ALT 274* 274*  ALKPHOS 779* 711*  BILITOT 35.4* 31.2*  PROT 6.3* 5.7*  ALBUMIN 2.8* 2.6*   No results for input(s): LIPASE, AMYLASE in the last 168 hours. No results for input(s): AMMONIA in the last 168  hours. Coagulation Profile:  Recent Labs Lab 07/07/16 1351 07/07/16 1949  INR 0.95 0.95   CBC:  Recent Labs Lab 07/07/16 1211 07/08/16 0425  WBC 13.4* 10.2  HGB 13.8 11.8*  HCT 40.1 32.8*  MCV 95.0 89.6  PLT 408 447*   Cardiac Enzymes: No results for input(s): CKTOTAL, CKMB, CKMBINDEX, TROPONINI in the last 168 hours. BNP: Invalid input(s): POCBNP CBG:  Recent Labs Lab 07/08/16 0752  GLUCAP 95   HbA1C: No results for input(s): HGBA1C in the last 72 hours. Urine analysis:    Component Value Date/Time   COLORURINE AMBER (A) 07/07/2016 1351   APPEARANCEUR CLEAR (A) 07/07/2016 1351   LABSPEC 1.006 07/07/2016 1351   PHURINE 6.0 07/07/2016 1351   GLUCOSEU NEGATIVE 07/07/2016 1351   HGBUR NEGATIVE 07/07/2016 1351   BILIRUBINUR MODERATE (A) 07/07/2016 1351   KETONESUR NEGATIVE 07/07/2016 1351   PROTEINUR NEGATIVE 07/07/2016 1351   NITRITE NEGATIVE 07/07/2016 1351   LEUKOCYTESUR NEGATIVE 07/07/2016 1351   Sepsis Labs: @LABRCNTIP (procalcitonin:4,lacticidven:4) )No results found for this or any previous visit (from the past 240 hour(s)).   Scheduled Meds: . heparin  5,000 Units Subcutaneous Q8H  . mometasone-formoterol  2 puff Inhalation BID  . nicotine  21 mg Transdermal Daily  . piperacillin-tazobactam (ZOSYN)  IV  3.375 g Intravenous Q8H  . pneumococcal 23 valent vaccine  0.5 mL Intramuscular Tomorrow-1000   Continuous Infusions: . sodium chloride 0.9 % 1,000 mL with potassium chloride 40 mEq infusion      Procedures/Studies: US Abdomen Limited Ruq  Result Date: 07/07/2016 CLINICAL DATA:  Jaundice and pain EXAM: US ABDOMEN LIMITED - RIGHT UPPER QUADRANT COMPARISON:  None. FINDINGS: Gallbladder: The gallbladder is distended with borderline wall thickening. No gallstones or pericholecystic fluid evident. No sonographic Murphy sign noted by sonographer. Common bile duct: Diameter: 15 mm common distended. There is generalized intrahepatic biliary duct  dilatation. There is common hepatic and common bile duct dilatation without evident mass or calculus. No obvious pancreatic head lesion seen by ultrasound. Liver: No focal lesion identified. Within normal limits in parenchymal echogenicity. There is intrahepatic biliary duct dilatation. IMPRESSION: Distended gallbladder with slight gallbladder wall thickening. There may be early acalculus cholecystitis. There is generalized intrahepatic and extrahepatic biliary duct dilatation without mass or calculus evident. Etiology uncertain. This finding may warrant MRCP to further assess. No parenchymal liver lesions evident. Electronically Signed   By: Lowella Grip III M.D.   On: 07/07/2016 13:54    Michaelann Gunnoe, DO  [facility] Pager [phone]  If 7PM-7AM, please contact night-coverage [email or web address] Password [email or web address] 07/08/2016, 11:20 AM   LOS: 1 day

## 2016-07-08 NOTE — Consult Note (Signed)
[facility] Surgery Consult Note  Benjamin Diaz. 03/25/1961  [id number].    Requesting MD: Tat, MD Chief Complaint/Reason for Consult: gallbladder distention   HPI:  [name] is a 56 y.o. Male with multiple medical problems,  who presented to [facility] hospital as a transfer from [place] with a CC of painless jaundice. He reports noticing the jaundice roughly 2 weeks ago. He is a current smoker and reports drinking 2-3 beers per night.   ED workup: hyponatremia, hypokalemia, Alk phos 780, AST 198, ALT 274, total bilirubin 35.4, and leukocytosis (13,400). RUQ U/S significant for distended gallbladder with borderline wall thickening and obvious hepatic and common bile duct dilatation. General surgery has been asked to evaluate for possible cholecystectomy.   ROS: Review of Systems  Constitutional: Negative for chills and fever.  HENT: Negative.   Eyes: Negative.   Respiratory: Negative for cough and shortness of breath.   Cardiovascular: Negative for chest pain and palpitations.  Gastrointestinal: Negative for abdominal pain, blood in stool, diarrhea, melena, nausea and vomiting.  Genitourinary: Negative for dysuria and hematuria.  Musculoskeletal: Positive for back pain.  Skin: Positive for itching. Negative for rash.       Jaundice   Neurological: Negative.     Family History  Problem Relation Age of Onset  . Heart disease Father     Past Medical History:  Diagnosis Date  . Asthma   . Atrial fibrillation ([place])   . COPD (chronic obstructive pulmonary disease) ([place])   . Pneumonia     Past Surgical History:  Procedure Laterality Date  . KNEE SURGERY  2004  . SHOULDER SURGERY  2002    Social History:  reports that he has been smoking Cigarettes.  He has a 40.00 pack-year smoking history. He has never used smokeless tobacco. He reports that he drinks about 4.2 oz of alcohol per week . His drug history is not on file.  Allergies: No Known Allergies  Medications  Prior to Admission  Medication Sig Dispense Refill  . albuterol (PROVENTIL HFA) 108 (90 Base) MCG/ACT inhaler Inhale 2 puffs into the lungs every 4 (four) hours as needed for wheezing or shortness of breath. 18 g 6  . albuterol (PROVENTIL) (2.5 MG/3ML) 0.083% nebulizer solution Take 3 mLs (2.5 mg total) by nebulization every 6 (six) hours as needed for wheezing or shortness of breath. (Patient not taking: Reported on 07/07/2016) 75 mL 12  . budesonide-formoterol (SYMBICORT) 160-4.5 MCG/ACT inhaler Inhale 2 puffs into the lungs 2 (two) times daily.    Marland Kitchen gabapentin (NEURONTIN) 300 MG capsule Take 1 capsule (300 mg total) by mouth at bedtime. 30 capsule 3  . naproxen (NAPROSYN) 500 MG tablet Take 1 tablet (500 mg total) by mouth 2 (two) times daily as needed. 60 tablet 0    Blood pressure 108/90, pulse 77, temperature 98.2 F (36.8 C), temperature source Oral, resp. rate 16, height 6' (1.829 m), weight 53.1 kg (117 lb), SpO2 99 %. Physical Exam: Physical Exam  Results for orders placed or performed during the hospital encounter of 07/07/16 (from the past 48 hour(s))  Basic metabolic panel     Status: Abnormal   Collection Time: 07/07/16  7:33 PM  Result Value Ref Range   Sodium 128 (L) 135 - 145 mmol/L   Potassium 3.4 (L) 3.5 - 5.1 mmol/L   Chloride 89 (L) 101 - 111 mmol/L   CO2 31 22 - 32 mmol/L   Glucose, Bld 92 65 - 99 mg/dL  BUN 8 6 - 20 mg/dL   Creatinine, Ser <0.30 (L) 0.61 - 1.24 mg/dL   Calcium 9.0 8.9 - 10.3 mg/dL   GFR calc non Af Amer NOT CALCULATED >60 mL/min   GFR calc Af Amer NOT CALCULATED >60 mL/min    Comment: (NOTE) The eGFR has been calculated using the CKD EPI equation. This calculation has not been validated in all clinical situations. eGFR's persistently <60 mL/min signify possible Chronic Kidney Disease.    Anion gap 8 5 - 15  Acetaminophen level     Status: Abnormal   Collection Time: 07/07/16  7:33 PM  Result Value Ref Range   Acetaminophen (Tylenol), Serum  <10 (L) 10 - 30 ug/mL    Comment:        THERAPEUTIC CONCENTRATIONS VARY SIGNIFICANTLY. A RANGE OF 10-30 ug/mL MAY BE AN EFFECTIVE CONCENTRATION FOR MANY PATIENTS. HOWEVER, SOME ARE BEST TREATED AT CONCENTRATIONS OUTSIDE THIS RANGE. ACETAMINOPHEN CONCENTRATIONS >150 ug/mL AT 4 HOURS AFTER INGESTION AND >50 ug/mL AT 12 HOURS AFTER INGESTION ARE OFTEN ASSOCIATED WITH TOXIC REACTIONS.   Protime-INR     Status: None   Collection Time: 07/07/16  7:49 PM  Result Value Ref Range   Prothrombin Time 12.7 11.4 - 15.2 seconds   INR 0.95   Comprehensive metabolic panel     Status: Abnormal   Collection Time: 07/08/16  4:25 AM  Result Value Ref Range   Sodium 130 (L) 135 - 145 mmol/L   Potassium 3.0 (L) 3.5 - 5.1 mmol/L   Chloride 91 (L) 101 - 111 mmol/L   CO2 30 22 - 32 mmol/L   Glucose, Bld 100 (H) 65 - 99 mg/dL   BUN 8 6 - 20 mg/dL   Creatinine, Ser <0.30 (L) 0.61 - 1.24 mg/dL   Calcium 8.8 (L) 8.9 - 10.3 mg/dL   Total Protein 5.7 (L) 6.5 - 8.1 g/dL   Albumin 2.6 (L) 3.5 - 5.0 g/dL   AST 231 (H) 15 - 41 U/L   ALT 274 (H) 17 - 63 U/L   Alkaline Phosphatase 711 (H) 38 - 126 U/L   Total Bilirubin 31.2 (HH) 0.3 - 1.2 mg/dL    Comment: RESULTS CONFIRMED BY MANUAL DILUTION CRITICAL RESULT CALLED TO, READ BACK BY AND VERIFIED WITHBennie Dallas RN @ [phone] ON 07/08/16 BY C DAVIS    GFR calc non Af Amer NOT CALCULATED >60 mL/min   GFR calc Af Amer NOT CALCULATED >60 mL/min    Comment: (NOTE) The eGFR has been calculated using the CKD EPI equation. This calculation has not been validated in all clinical situations. eGFR's persistently <60 mL/min signify possible Chronic Kidney Disease.    Anion gap 9 5 - 15  CBC     Status: Abnormal   Collection Time: 07/08/16  4:25 AM  Result Value Ref Range   WBC 10.2 4.0 - 10.5 K/uL   RBC 3.66 (L) 4.22 - 5.81 MIL/uL   Hemoglobin 11.8 (L) 13.0 - 17.0 g/dL   HCT 32.8 (L) 39.0 - 52.0 %   MCV 89.6 78.0 - 100.0 fL   MCH 32.2 26.0 - 34.0 pg   MCHC  36.0 30.0 - 36.0 g/dL   RDW 17.9 (H) 11.5 - 15.5 %   Platelets 447 (H) 150 - 400 K/uL  Glucose, capillary     Status: None   Collection Time: 07/08/16  7:52 AM  Result Value Ref Range   Glucose-Capillary 95 65 - 99 mg/dL  Sodium, urine, random  Status: None   Collection Time: 07/08/16 12:02 PM  Result Value Ref Range   Sodium, Ur 131 mmol/L    Comment: Performed at [facility] [address] [place], [place] [address]  Creatinine, urine, random     Status: None   Collection Time: 07/08/16 12:02 PM  Result Value Ref Range   Creatinine, Urine 38.46 mg/dL    Comment: Performed at [facility] [address] [place],  [address]  Lipase, blood     Status: None   Collection Time: 07/08/16 12:20 PM  Result Value Ref Range   Lipase 43 11 - 51 U/L  Magnesium     Status: None   Collection Time: 07/08/16 12:20 PM  Result Value Ref Range   Magnesium 1.9 1.7 - 2.4 mg/dL   US Abdomen Limited Ruq  Result Date: 07/07/2016 CLINICAL DATA:  Jaundice and pain EXAM: US ABDOMEN LIMITED - RIGHT UPPER QUADRANT COMPARISON:  None. FINDINGS: Gallbladder: The gallbladder is distended with borderline wall thickening. No gallstones or pericholecystic fluid evident. No sonographic Murphy sign noted by sonographer. Common bile duct: Diameter: 15 mm common distended. There is generalized intrahepatic biliary duct dilatation. There is common hepatic and common bile duct dilatation without evident mass or calculus. No obvious pancreatic head lesion seen by ultrasound. Liver: No focal lesion identified. Within normal limits in parenchymal echogenicity. There is intrahepatic biliary duct dilatation. IMPRESSION: Distended gallbladder with slight gallbladder wall thickening. There may be early acalculus cholecystitis. There is generalized intrahepatic and extrahepatic biliary duct dilatation without mass or calculus evident. Etiology uncertain. This finding may warrant MRCP to further assess. No  parenchymal liver lesions evident. Electronically Signed   By: Lowella Grip III M.D.   On: 07/07/2016 13:54   Assessment/Plan Painless jaundice Hyperbilirubinemia  - RUQ U/S w/ GB distention and bilary dilatation, no cholelithiasis.  - Agree with MRCP to r/o malignancy followed by ERCP.  - There does not appear to be any signs of cholecystitis clinically.  GB distention most likely due to CBD obstruction.  general surgery will follow  transaminitis - hepatitis panel pending; possible underlying liver disease related to [facility] use and contributing to elevated LFT's Hyponatremia Hypokalemia COPD/tobacco abuse   Plan: MRCP/ERCP per GI   Rosario Adie, MD  [facility]

## 2016-07-08 NOTE — Progress Notes (Signed)
Patient refused pneumonia vaccine today. Will reschedule.

## 2016-07-08 NOTE — Progress Notes (Signed)
Care notes re: pneumonia vaccine rendered to patient, verbalized understanding.

## 2016-07-08 NOTE — Consult Note (Signed)
[facility] Consult Note  Referring Provider: No ref. provider found Primary Care Physician:  Boyce Medici, FNP Primary Gastroenterologist:  Dr.  Laurel Dimmer Complaint:  HPI: Benjamin Diaz. is an 56 y.o.  male   white male who presents with a 2 week history of worsening jaundice with minimal epigastric pain the last 2 days. He was found to have elevated liver function tests with a bilirubin greater than 30. Ultrasound showed dilated common bile duct intrahepatic ducts and gallbladder. She denies any weight loss or other symptoms.  Past Medical History:  Diagnosis Date  . Asthma   . Atrial fibrillation ([place])   . COPD (chronic obstructive pulmonary disease) ([place])   . Pneumonia     Past Surgical History:  Procedure Laterality Date  . KNEE SURGERY  2004  . SHOULDER SURGERY  2002    Medications Prior to Admission  Medication Sig Dispense Refill  . albuterol (PROVENTIL HFA) 108 (90 Base) MCG/ACT inhaler Inhale 2 puffs into the lungs every 4 (four) hours as needed for wheezing or shortness of breath. 18 g 6  . albuterol (PROVENTIL) (2.5 MG/3ML) 0.083% nebulizer solution Take 3 mLs (2.5 mg total) by nebulization every 6 (six) hours as needed for wheezing or shortness of breath. (Patient not taking: Reported on 07/07/2016) 75 mL 12  . budesonide-formoterol (SYMBICORT) 160-4.5 MCG/ACT inhaler Inhale 2 puffs into the lungs 2 (two) times daily.    Marland Kitchen gabapentin (NEURONTIN) 300 MG capsule Take 1 capsule (300 mg total) by mouth at bedtime. 30 capsule 3  . naproxen (NAPROSYN) 500 MG tablet Take 1 tablet (500 mg total) by mouth 2 (two) times daily as needed. 60 tablet 0    Allergies: No Known Allergies  Family History  Problem Relation Age of Onset  . Heart disease Father     Social History:  reports that he has been smoking Cigarettes.  He has a 40.00 pack-year smoking history. He has never used smokeless tobacco. He reports that he drinks about 4.2 oz of alcohol per week . His  drug history is not on file.  Review of Systems: negative except As above   Blood pressure 111/74, pulse 82, temperature 98.4 F (36.9 C), temperature source Oral, resp. rate 14, height 6' (1.829 m), weight 53.1 kg (117 lb), SpO2 97 %. Head: Normocephalic, without obvious abnormality, atraumatic Neck: no adenopathy, no carotid bruit, no JVD, supple, symmetrical, trachea midline and thyroid not enlarged, symmetric, no tenderness/mass/nodules Resp: clear to auscultation bilaterally Cardio: regular rate and rhythm, S1, S2 normal, no murmur, click, rub or gallop abdomen soft nondistended with normoactive bowel sounds. No hepatosplenomegaly mass or guarding Extremities: extremities normal, atraumatic, no cyanosis or edema  Results for orders placed or performed during the hospital encounter of 07/07/16 (from the past 48 hour(s))  Basic metabolic panel     Status: Abnormal   Collection Time: 07/07/16  7:33 PM  Result Value Ref Range   Sodium 128 (L) 135 - 145 mmol/L   Potassium 3.4 (L) 3.5 - 5.1 mmol/L   Chloride 89 (L) 101 - 111 mmol/L   CO2 31 22 - 32 mmol/L   Glucose, Bld 92 65 - 99 mg/dL   BUN 8 6 - 20 mg/dL   Creatinine, Ser <0.30 (L) 0.61 - 1.24 mg/dL   Calcium 9.0 8.9 - 10.3 mg/dL   GFR calc non Af Amer NOT CALCULATED >60 mL/min   GFR calc Af Amer NOT CALCULATED >60 mL/min    Comment: (NOTE) The eGFR  has been calculated using the CKD EPI equation. This calculation has not been validated in all clinical situations. eGFR's persistently <60 mL/min signify possible Chronic Kidney Disease.    Anion gap 8 5 - 15  Acetaminophen level     Status: Abnormal   Collection Time: 07/07/16  7:33 PM  Result Value Ref Range   Acetaminophen (Tylenol), Serum <10 (L) 10 - 30 ug/mL    Comment:        THERAPEUTIC CONCENTRATIONS VARY SIGNIFICANTLY. A RANGE OF 10-30 ug/mL MAY BE AN EFFECTIVE CONCENTRATION FOR MANY PATIENTS. HOWEVER, SOME ARE BEST TREATED AT CONCENTRATIONS OUTSIDE  THIS RANGE. ACETAMINOPHEN CONCENTRATIONS >150 ug/mL AT 4 HOURS AFTER INGESTION AND >50 ug/mL AT 12 HOURS AFTER INGESTION ARE OFTEN ASSOCIATED WITH TOXIC REACTIONS.   Protime-INR     Status: None   Collection Time: 07/07/16  7:49 PM  Result Value Ref Range   Prothrombin Time 12.7 11.4 - 15.2 seconds   INR 0.95   Comprehensive metabolic panel     Status: Abnormal   Collection Time: 07/08/16  4:25 AM  Result Value Ref Range   Sodium 130 (L) 135 - 145 mmol/L   Potassium 3.0 (L) 3.5 - 5.1 mmol/L   Chloride 91 (L) 101 - 111 mmol/L   CO2 30 22 - 32 mmol/L   Glucose, Bld 100 (H) 65 - 99 mg/dL   BUN 8 6 - 20 mg/dL   Creatinine, Ser <0.30 (L) 0.61 - 1.24 mg/dL   Calcium 8.8 (L) 8.9 - 10.3 mg/dL   Total Protein 5.7 (L) 6.5 - 8.1 g/dL   Albumin 2.6 (L) 3.5 - 5.0 g/dL   AST 231 (H) 15 - 41 U/L   ALT 274 (H) 17 - 63 U/L   Alkaline Phosphatase 711 (H) 38 - 126 U/L   Total Bilirubin 31.2 (HH) 0.3 - 1.2 mg/dL    Comment: RESULTS CONFIRMED BY MANUAL DILUTION CRITICAL RESULT CALLED TO, READ BACK BY AND VERIFIED WITHBennie Dallas RN @ [phone] ON 07/08/16 BY C DAVIS    GFR calc non Af Amer NOT CALCULATED >60 mL/min   GFR calc Af Amer NOT CALCULATED >60 mL/min    Comment: (NOTE) The eGFR has been calculated using the CKD EPI equation. This calculation has not been validated in all clinical situations. eGFR's persistently <60 mL/min signify possible Chronic Kidney Disease.    Anion gap 9 5 - 15  CBC     Status: Abnormal   Collection Time: 07/08/16  4:25 AM  Result Value Ref Range   WBC 10.2 4.0 - 10.5 K/uL   RBC 3.66 (L) 4.22 - 5.81 MIL/uL   Hemoglobin 11.8 (L) 13.0 - 17.0 g/dL   HCT 32.8 (L) 39.0 - 52.0 %   MCV 89.6 78.0 - 100.0 fL   MCH 32.2 26.0 - 34.0 pg   MCHC 36.0 30.0 - 36.0 g/dL   RDW 17.9 (H) 11.5 - 15.5 %   Platelets 447 (H) 150 - 400 K/uL  Glucose, capillary     Status: None   Collection Time: 07/08/16  7:52 AM  Result Value Ref Range   Glucose-Capillary 95 65 - 99 mg/dL    US Abdomen Limited Ruq  Result Date: 07/07/2016 CLINICAL DATA:  Jaundice and pain EXAM: US ABDOMEN LIMITED - RIGHT UPPER QUADRANT COMPARISON:  None. FINDINGS: Gallbladder: The gallbladder is distended with borderline wall thickening. No gallstones or pericholecystic fluid evident. No sonographic Murphy sign noted by sonographer. Common bile duct: Diameter: 15 mm common distended.  There is generalized intrahepatic biliary duct dilatation. There is common hepatic and common bile duct dilatation without evident mass or calculus. No obvious pancreatic head lesion seen by ultrasound. Liver: No focal lesion identified. Within normal limits in parenchymal echogenicity. There is intrahepatic biliary duct dilatation. IMPRESSION: Distended gallbladder with slight gallbladder wall thickening. There may be early acalculus cholecystitis. There is generalized intrahepatic and extrahepatic biliary duct dilatation without mass or calculus evident. Etiology uncertain. This finding may warrant MRCP to further assess. No parenchymal liver lesions evident. Electronically Signed   By: Lowella Grip III M.D.   On: 07/07/2016 13:54    Assessment: Extrahepatic obstructive jaundice, nature unclear by ultrasound  Plan: MRCP today, ERCP tomorrow. Risks rationale and alternatives were explained to the patient he wished to proceed.  Alizah Sills C 07/08/2016, 10:03 AM  Pager [phone] If no answer or after 5 PM call [phone]

## 2016-07-09 ENCOUNTER — Inpatient Hospital Stay (HOSPITAL_COMMUNITY): Payer: BLUE CROSS/BLUE SHIELD | Admitting: Anesthesiology

## 2016-07-09 ENCOUNTER — Encounter (HOSPITAL_COMMUNITY): Payer: Self-pay

## 2016-07-09 ENCOUNTER — Encounter (HOSPITAL_COMMUNITY)
Admission: AD | Disposition: A | Discharge status: Home / Self Care | Payer: Self-pay | Source: Other Acute Inpatient Hospital | Attending: Internal Medicine

## 2016-07-09 ENCOUNTER — Inpatient Hospital Stay (HOSPITAL_COMMUNITY): Payer: BLUE CROSS/BLUE SHIELD

## 2016-07-09 DIAGNOSIS — E43 Unspecified severe protein-calorie malnutrition: Secondary | ICD-10-CM | POA: Insufficient documentation

## 2016-07-09 DIAGNOSIS — K869 Disease of pancreas, unspecified: Secondary | ICD-10-CM

## 2016-07-09 DIAGNOSIS — K8689 Other specified diseases of pancreas: Secondary | ICD-10-CM

## 2016-07-09 DIAGNOSIS — I10 Essential (primary) hypertension: Secondary | ICD-10-CM

## 2016-07-09 HISTORY — PX: ERCP: SHX5425

## 2016-07-09 LAB — COMPREHENSIVE METABOLIC PANEL
ALK PHOS: 670 U/L — AB (ref 38–126)
ALT: 258 U/L — AB (ref 17–63)
AST: 197 U/L — AB (ref 15–41)
Albumin: 2.2 g/dL — ABNORMAL LOW (ref 3.5–5.0)
Anion gap: 7 (ref 5–15)
BUN: 6 mg/dL (ref 6–20)
CALCIUM: 8.8 mg/dL — AB (ref 8.9–10.3)
CHLORIDE: 94 mmol/L — AB (ref 101–111)
CO2: 28 mmol/L (ref 22–32)
Glucose, Bld: 97 mg/dL (ref 65–99)
Potassium: 3.6 mmol/L (ref 3.5–5.1)
Sodium: 129 mmol/L — ABNORMAL LOW (ref 135–145)
Total Bilirubin: 28.5 mg/dL (ref 0.3–1.2)
Total Protein: 5.4 g/dL — ABNORMAL LOW (ref 6.5–8.1)

## 2016-07-09 LAB — GLUCOSE, CAPILLARY: GLUCOSE-CAPILLARY: 108 mg/dL — AB (ref 65–99)

## 2016-07-09 LAB — CBC
HCT: 31.5 % — ABNORMAL LOW (ref 39.0–52.0)
Hemoglobin: 11.2 g/dL — ABNORMAL LOW (ref 13.0–17.0)
MCH: 31.5 pg (ref 26.0–34.0)
MCHC: 35.6 g/dL (ref 30.0–36.0)
MCV: 88.5 fL (ref 78.0–100.0)
PLATELETS: 422 10*3/uL — AB (ref 150–400)
RBC: 3.56 MIL/uL — AB (ref 4.22–5.81)
RDW: 17.6 % — AB (ref 11.5–15.5)
WBC: 12 10*3/uL — AB (ref 4.0–10.5)

## 2016-07-09 LAB — HEPATITIS B SURFACE ANTIGEN: HEP B S AG: NEGATIVE

## 2016-07-09 LAB — HEPATITIS C ANTIBODY: HCV Ab: <0.1 s/co ratio (ref 0.0–0.9)

## 2016-07-09 LAB — HEPATITIS PANEL, ACUTE
HCV AB: 0.1 {s_co_ratio} (ref 0.0–0.9)
HEP A IGM: NEGATIVE
HEP B C IGM: NEGATIVE
Hepatitis B Surface Ag: NEGATIVE

## 2016-07-09 LAB — HIV ANTIBODY (ROUTINE TESTING W REFLEX): HIV Screen 4th Generation wRfx: NONREACTIVE

## 2016-07-09 SURGERY — ERCP, WITH INTERVENTION IF INDICATED
Anesthesia: General

## 2016-07-09 MED ORDER — ENSURE ENLIVE PO LIQD
237.0000 mL | Freq: Three times a day (TID) | ORAL | Status: DC
  Administered 2016-07-09 – 2016-07-11 (×6): 237 mL via ORAL

## 2016-07-09 MED ORDER — INDOMETHACIN 50 MG RE SUPP
RECTAL | Status: AC
  Filled 2016-07-09: qty 2

## 2016-07-09 MED ORDER — SODIUM CHLORIDE 0.9 % IV SOLN
INTRAVENOUS | Status: DC
Start: 2016-07-09 — End: 2016-07-09

## 2016-07-09 MED ORDER — SUCCINYLCHOLINE CHLORIDE 200 MG/10ML IV SOSY
PREFILLED_SYRINGE | INTRAVENOUS | Status: AC
  Filled 2016-07-09: qty 10

## 2016-07-09 MED ORDER — LACTATED RINGERS IV SOLN
INTRAVENOUS | Status: DC | PRN
  Administered 2016-07-09: 11:00:00 via INTRAVENOUS

## 2016-07-09 MED ORDER — LIDOCAINE 2% (20 MG/ML) 5 ML SYRINGE
INTRAMUSCULAR | Status: AC
  Filled 2016-07-09: qty 5

## 2016-07-09 MED ORDER — FENTANYL CITRATE (PF) 100 MCG/2ML IJ SOLN
INTRAMUSCULAR | Status: AC
  Filled 2016-07-09: qty 2

## 2016-07-09 MED ORDER — MIDAZOLAM HCL 5 MG/5ML IJ SOLN
INTRAMUSCULAR | Status: DC | PRN
  Administered 2016-07-09: 2 mg via INTRAVENOUS

## 2016-07-09 MED ORDER — LIDOCAINE 2% (20 MG/ML) 5 ML SYRINGE
INTRAMUSCULAR | Status: DC | PRN
  Administered 2016-07-09: 100 mg via INTRAVENOUS

## 2016-07-09 MED ORDER — FENTANYL CITRATE (PF) 100 MCG/2ML IJ SOLN
INTRAMUSCULAR | Status: DC | PRN
  Administered 2016-07-09: 50 ug via INTRAVENOUS

## 2016-07-09 MED ORDER — IOPAMIDOL (ISOVUE-300) INJECTION 61%
75.0000 mL | Freq: Once | INTRAVENOUS | Status: AC | PRN
  Administered 2016-07-09: 75 mL via INTRAVENOUS

## 2016-07-09 MED ORDER — PROPOFOL 10 MG/ML IV BOLUS
INTRAVENOUS | Status: AC
Start: 2016-07-09 — End: 2016-07-09
  Filled 2016-07-09: qty 20

## 2016-07-09 MED ORDER — SODIUM CHLORIDE 0.9 % IV SOLN
INTRAVENOUS | Status: DC | PRN
  Administered 2016-07-09: 15 mL

## 2016-07-09 MED ORDER — PROPOFOL 10 MG/ML IV BOLUS
INTRAVENOUS | Status: DC | PRN
  Administered 2016-07-09: 170 mg via INTRAVENOUS

## 2016-07-09 MED ORDER — GLUCAGON HCL RDNA (DIAGNOSTIC) 1 MG IJ SOLR
INTRAMUSCULAR | Status: AC
  Filled 2016-07-09: qty 1

## 2016-07-09 MED ORDER — ADULT MULTIVITAMIN W/MINERALS CH
1.0000 | ORAL_TABLET | Freq: Every day | ORAL | Status: DC
  Administered 2016-07-09 – 2016-07-11 (×3): 1 via ORAL
  Filled 2016-07-09 (×3): qty 1

## 2016-07-09 MED ORDER — DEXAMETHASONE SODIUM PHOSPHATE 10 MG/ML IJ SOLN
INTRAMUSCULAR | Status: DC | PRN
  Administered 2016-07-09: 10 mg via INTRAVENOUS

## 2016-07-09 MED ORDER — SUCCINYLCHOLINE CHLORIDE 200 MG/10ML IV SOSY
PREFILLED_SYRINGE | INTRAVENOUS | Status: DC | PRN
  Administered 2016-07-09: 120 mg via INTRAVENOUS

## 2016-07-09 NOTE — Progress Notes (Signed)
[facility] Progress Note  Subjective: Basically asymptomatic  Objective: Vital signs in last 24 hours: Temp:  [97.2 F (36.2 C)-98.2 F (36.8 C)] 98.2 F (36.8 C) (04/13 0959) Pulse Rate:  [71-82] 71 (04/13 0959) Resp:  [16-19] 19 (04/13 0959) BP: (108-134)/(71-90) 134/82 (04/13 0959) SpO2:  [94 %-99 %] 95 % (04/13 0959) Weight:  [53.6 kg (118 lb 2.7 oz)] 53.6 kg (118 lb 2.7 oz) (04/13 0142) Weight change: 0.529 kg (1 lb 2.7 oz)   PE: Unchanged  Lab Results: Results for orders placed or performed during the hospital encounter of 07/07/16 (from the past 24 hour(s))  Lipase, blood     Status: None   Collection Time: 07/08/16 12:20 PM  Result Value Ref Range   Lipase 43 11 - 51 U/L  Hepatitis B surface antigen     Status: None   Collection Time: 07/08/16 12:20 PM  Result Value Ref Range   Hepatitis B Surface Ag Negative Negative  Hepatitis C antibody     Status: None   Collection Time: 07/08/16 12:20 PM  Result Value Ref Range   HCV Ab <0.1 0.0 - 0.9 s/co ratio  HIV antibody     Status: None   Collection Time: 07/08/16 12:20 PM  Result Value Ref Range   HIV Screen 4th Generation wRfx Non Reactive Non Reactive  Magnesium     Status: None   Collection Time: 07/08/16 12:20 PM  Result Value Ref Range   Magnesium 1.9 1.7 - 2.4 mg/dL  Comprehensive metabolic panel     Status: Abnormal   Collection Time: 07/09/16  4:14 AM  Result Value Ref Range   Sodium 129 (L) 135 - 145 mmol/L   Potassium 3.6 3.5 - 5.1 mmol/L   Chloride 94 (L) 101 - 111 mmol/L   CO2 28 22 - 32 mmol/L   Glucose, Bld 97 65 - 99 mg/dL   BUN 6 6 - 20 mg/dL   Creatinine, Ser <0.30 (L) 0.61 - 1.24 mg/dL   Calcium 8.8 (L) 8.9 - 10.3 mg/dL   Total Protein 5.4 (L) 6.5 - 8.1 g/dL   Albumin 2.2 (L) 3.5 - 5.0 g/dL   AST 197 (H) 15 - 41 U/L   ALT 258 (H) 17 - 63 U/L   Alkaline Phosphatase 670 (H) 38 - 126 U/L   Total Bilirubin 28.5 (HH) 0.3 - 1.2 mg/dL   GFR calc non Af Amer NOT CALCULATED >60  mL/min   GFR calc Af Amer NOT CALCULATED >60 mL/min   Anion gap 7 5 - 15  CBC     Status: Abnormal   Collection Time: 07/09/16  4:14 AM  Result Value Ref Range   WBC 12.0 (H) 4.0 - 10.5 K/uL   RBC 3.56 (L) 4.22 - 5.81 MIL/uL   Hemoglobin 11.2 (L) 13.0 - 17.0 g/dL   HCT 31.5 (L) 39.0 - 52.0 %   MCV 88.5 78.0 - 100.0 fL   MCH 31.5 26.0 - 34.0 pg   MCHC 35.6 30.0 - 36.0 g/dL   RDW 17.6 (H) 11.5 - 15.5 %   Platelets 422 (H) 150 - 400 K/uL  Glucose, capillary     Status: Abnormal   Collection Time: 07/09/16  8:07 AM  Result Value Ref Range   Glucose-Capillary 108 (H) 65 - 99 mg/dL    Studies/Results: Mr 3d Recon At Scanner  Result Date: 07/08/2016 CLINICAL DATA:  Inpatient admitted with jaundice. Biliary ductal dilatation on recent sonogram. Recent abdominal pain. History of tobacco  abuse and COPD. EXAM: MRI ABDOMEN WITHOUT CONTRAST  (INCLUDING MRCP) TECHNIQUE: Multiplanar multisequence MR imaging of the abdomen was performed. Heavily T2-weighted images of the biliary and pancreatic ducts were obtained, and three-dimensional MRCP images were rendered by post processing. COMPARISON:  07/07/2016 right upper quadrant abdominal sonogram. FINDINGS: Lower chest: Grossly clear lung bases. Hepatobiliary: Normal liver size and configuration. No hepatic steatosis. No liver mass. Marked diffuse intrahepatic biliary ductal dilatation. Dilated common bile duct measuring 15 mm diameter proximally. Abrupt common bile duct stricture at the level of the pancreatic head with shelf-like margins. No choledocholithiasis. Distended gallbladder. No definite gallbladder wall thickening. No pericholecystic fluid. No cholelithiasis. Pancreas: There is a hypoenhancing 1.9 x 1.6 cm posterior pancreatic head mass (series 1103/ image 44), which abuts posteriorly the site of the common bile duct stricture. Normal caliber main pancreatic duct, noting the presence of pancreas divisum with drainage of the main pancreatic duct via  an accessory duct of [place] with no definite communication of the main pancreatic duct with the common bile duct. No additional pancreatic masses. Spleen: Normal size. No mass. Adrenals/Urinary Tract: Normal adrenals. No hydronephrosis. Normal kidneys with no renal mass. Stomach/Bowel: Grossly normal stomach. Visualized small and large bowel is normal caliber, with no bowel wall thickening. Vascular/Lymphatic: Normal caliber abdominal aorta. Patent renal, splenic and hepatic veins. The pancreatic head mass may minimally abut the posterior wall of the proximal main portal vein, involving less than 30% of the main portal vein circumference (series 1103/ image 41), with no evidence of portal venous narrowing. Main, right and left portal veins are patent and normal caliber. Superior mesenteric vein, superior mesenteric artery and celiac axis are patent and uninvolved by the mass. No pathologically enlarged lymph nodes in the abdomen. Other: No abdominal ascites or focal fluid collection. Musculoskeletal: No aggressive appearing focal osseous lesions. IMPRESSION: 1. Hypoenhancing 1.9 cm posterior pancreatic head mass, suggestive of primary pancreatic adenocarcinoma. Minimal potential main portal vein involvement by the mass as detailed. GI consultation advised for consideration of ERCP and endoscopic ultrasound for tissue sampling. 2. Marked diffuse intrahepatic biliary ductal dilatation. Gallbladder distention. Common bile duct dilation (15 mm diameter proximally). Malignant appearing common bile duct stricture at the level of the pancreatic head. 3. No pancreatic duct dilation, noting the presence of pancreas divisum. 4. No evidence of metastatic disease in the abdomen. Electronically Signed   By: Ilona Sorrel M.D.   On: 07/08/2016 17:30   Mr Abdomen With Mrcp W Contrast  Result Date: 07/08/2016 CLINICAL DATA:  Inpatient admitted with jaundice. Biliary ductal dilatation on recent sonogram. Recent abdominal pain.  History of tobacco abuse and COPD. EXAM: MRI ABDOMEN WITHOUT CONTRAST  (INCLUDING MRCP) TECHNIQUE: Multiplanar multisequence MR imaging of the abdomen was performed. Heavily T2-weighted images of the biliary and pancreatic ducts were obtained, and three-dimensional MRCP images were rendered by post processing. COMPARISON:  07/07/2016 right upper quadrant abdominal sonogram. FINDINGS: Lower chest: Grossly clear lung bases. Hepatobiliary: Normal liver size and configuration. No hepatic steatosis. No liver mass. Marked diffuse intrahepatic biliary ductal dilatation. Dilated common bile duct measuring 15 mm diameter proximally. Abrupt common bile duct stricture at the level of the pancreatic head with shelf-like margins. No choledocholithiasis. Distended gallbladder. No definite gallbladder wall thickening. No pericholecystic fluid. No cholelithiasis. Pancreas: There is a hypoenhancing 1.9 x 1.6 cm posterior pancreatic head mass (series 1103/ image 44), which abuts posteriorly the site of the common bile duct stricture. Normal caliber main pancreatic duct, noting the presence of pancreas divisum  with drainage of the main pancreatic duct via an accessory duct of [place] with no definite communication of the main pancreatic duct with the common bile duct. No additional pancreatic masses. Spleen: Normal size. No mass. Adrenals/Urinary Tract: Normal adrenals. No hydronephrosis. Normal kidneys with no renal mass. Stomach/Bowel: Grossly normal stomach. Visualized small and large bowel is normal caliber, with no bowel wall thickening. Vascular/Lymphatic: Normal caliber abdominal aorta. Patent renal, splenic and hepatic veins. The pancreatic head mass may minimally abut the posterior wall of the proximal main portal vein, involving less than 30% of the main portal vein circumference (series 1103/ image 41), with no evidence of portal venous narrowing. Main, right and left portal veins are patent and normal caliber. Superior  mesenteric vein, superior mesenteric artery and celiac axis are patent and uninvolved by the mass. No pathologically enlarged lymph nodes in the abdomen. Other: No abdominal ascites or focal fluid collection. Musculoskeletal: No aggressive appearing focal osseous lesions. IMPRESSION: 1. Hypoenhancing 1.9 cm posterior pancreatic head mass, suggestive of primary pancreatic adenocarcinoma. Minimal potential main portal vein involvement by the mass as detailed. GI consultation advised for consideration of ERCP and endoscopic ultrasound for tissue sampling. 2. Marked diffuse intrahepatic biliary ductal dilatation. Gallbladder distention. Common bile duct dilation (15 mm diameter proximally). Malignant appearing common bile duct stricture at the level of the pancreatic head. 3. No pancreatic duct dilation, noting the presence of pancreas divisum. 4. No evidence of metastatic disease in the abdomen. Electronically Signed   By: Ilona Sorrel M.D.   On: 07/08/2016 17:30   US Abdomen Limited Ruq  Result Date: 07/07/2016 CLINICAL DATA:  Jaundice and pain EXAM: US ABDOMEN LIMITED - RIGHT UPPER QUADRANT COMPARISON:  None. FINDINGS: Gallbladder: The gallbladder is distended with borderline wall thickening. No gallstones or pericholecystic fluid evident. No sonographic Murphy sign noted by sonographer. Common bile duct: Diameter: 15 mm common distended. There is generalized intrahepatic biliary duct dilatation. There is common hepatic and common bile duct dilatation without evident mass or calculus. No obvious pancreatic head lesion seen by ultrasound. Liver: No focal lesion identified. Within normal limits in parenchymal echogenicity. There is intrahepatic biliary duct dilatation. IMPRESSION: Distended gallbladder with slight gallbladder wall thickening. There may be early acalculus cholecystitis. There is generalized intrahepatic and extrahepatic biliary duct dilatation without mass or calculus evident. Etiology uncertain.  This finding may warrant MRCP to further assess. No parenchymal liver lesions evident. Electronically Signed   By: Lowella Grip III M.D.   On: 07/07/2016 13:54      Assessment: 2 cm pancreatic head mass, status post ERCP with brushing and stenting with good drainage  Plan: 1. Await brushings 2. Follow LFTs Unless positive cytology will probably need EUS guided biopsy and imaging for candidacy for possible surgery.   Burkley Dech C 07/09/2016, 12:08 PM  Pager [phone] If no answer or after 5 PM call [phone]

## 2016-07-09 NOTE — Anesthesia Procedure Notes (Signed)
Procedure Name: Intubation Date/Time: 07/09/2016 11:34 AM Performed by: Lind Covert Pre-anesthesia Checklist: Patient identified, Emergency Drugs available, Suction available, Patient being monitored and Timeout performed Patient Re-evaluated:Patient Re-evaluated prior to inductionOxygen Delivery Method: Circle system utilized Preoxygenation: Pre-oxygenation with 100% oxygen Intubation Type: IV induction Laryngoscope Size: Mac and 4 Grade View: Grade I Tube type: Oral Tube size: 7.5 mm Number of attempts: 1 Airway Equipment and Method: Stylet Placement Confirmation: ETT inserted through vocal cords under direct vision,  positive ETCO2 and breath sounds checked- equal and bilateral Secured at: 22 cm Tube secured with: Tape Dental Injury: Teeth and Oropharynx as per pre-operative assessment

## 2016-07-09 NOTE — Progress Notes (Signed)
PROGRESS NOTE  Benjamin Diaz. LTJ:[id number] DOB: 12/02/1960 DOA: 07/07/2016 PCP: Boyce Medici, FNP Brief History:  56 year old male with a history of COPD, tobacco abuse, and alcohol dependence who presented in transfer from [place] with painless jaundice. Patient reports that he had been in his usual state of health until approximately 10 days ago when he noted the development of jaundice. Since that time, he reports becoming increasingly yellow. He denies any fevers or chills and denies any abdominal pain, nausea, vomiting, or diarrhea.  The patient initially thought this may have been partly due to gabapentin and amlodipine that he recently started. He discontinued these medications without improvement. He denies any other illegal or over-the-counter drug use. In the emergency department, the patient was noted to have elevated transaminases and a bilirubin of 35.4. Ultrasound of the right upper quadrant showed a distended gallbladder with some wall thickening with dilated intra-and extrahepatic biliary ducts. The patient was transferred to [facility] for further GI work up.  MRCP revealed a 1.9 cm pancreatic head mass. ERCP was performed and a plastic stent was placed in the common bile duct. Neurosurgery and medical oncology were consulted to assist.  Assessment/Plan: Hyperbilirubinemia/jaundice/Pancreatic Head mass  -07/07/2016 RUQ US--distended gallbladder with slightly wall thickening possibly reflecting a calculus cholecystitis. Generalized intrahepatic and extrahepatic ductal dilatation. -appreciate GI consult -suspect pt has underlying chronic liver disease superimposed upon his acute medical situation -MRCP--1.9 cm posterior pancreatic head mass suggestive of pancreatic adenocarcinoma; marked diffuse intrahepatic ductal dilatation with gallbladder distention -07/09/2016 ERCP--biliary tract obstruction secondary to mass in the lower third of the main duct--one plastic stent inserted  into CBD -consulted general surgery -check lipase--43 -check CA 19-9 -consult MedOnc--Dr. 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   Objective: Vitals:   07/09/16 1245 07/09/16 1250 07/09/16 1309 07/09/16 1413  BP:  140/86 123/81 115/75  Pulse: 72  68 75  Resp: 18  16 16   Temp:    97.4 F (36.3 C)  TempSrc:    Oral  SpO2: 94%  97% 97%  Weight:      Height:        Intake/Output Summary (Last 24 hours) at 07/09/16 1743 Last data filed at 07/09/16 1403  Gross per 24 hour  Intake             2500 ml  Output                0 ml  Net             2500 ml   Weight change: 0.529 kg (1 lb 2.7 oz) Exam:   General:  Pt is alert, follows commands appropriately, not in acute distress  HEENT: No icterus, No thrush, No neck mass, [place]/AT  Cardiovascular: RRR, S1/S2, no rubs, no gallops  Respiratory: CTA  bilaterally, no wheezing, no crackles, no rhonchi  Abdomen: Soft/+BS, non tender, non distended, no guarding  Extremities: No edema, No lymphangitis, No petechiae, No rashes, no synovitis   Data Reviewed: I have personally reviewed following labs and imaging studies Basic Metabolic Panel:  Recent Labs Lab 07/07/16 1211 07/07/16 1933 07/08/16 0425  07/08/16 1220 07/09/16 0414  NA 125* 128* 130*  --  129*  K 3.0* 3.4* 3.0*  --  3.6  CL 86* 89* 91*  --  94*  CO2 29 31 30   --  28  GLUCOSE 147* 92 100*  --  97  BUN 8 8 8   --  6  CREATININE NOT CALCULATED <0.30* <0.30*  --  <0.30*  CALCIUM 9.1 9.0 8.8*  --  8.8*  MG  --   --   --  1.9  --    Liver Function Tests:  Recent Labs Lab 07/07/16 1211 07/08/16 0425 07/09/16 0414  AST 198* 231* 197*  ALT 274* 274* 258*  ALKPHOS 779* 711* 670*  BILITOT 35.4* 31.2* 28.5*  PROT 6.3* 5.7* 5.4*  ALBUMIN 2.8* 2.6* 2.2*    Recent Labs Lab 07/08/16 1220  LIPASE 43   No results for input(s): AMMONIA in the last 168 hours. Coagulation Profile:  Recent Labs Lab 07/07/16 1351 07/07/16 1949  INR 0.95 0.95   CBC:  Recent Labs Lab 07/07/16 1211 07/08/16 0425 07/09/16 0414  WBC 13.4* 10.2 12.0*  HGB 13.8 11.8* 11.2*  HCT 40.1 32.8* 31.5*  MCV 95.0 89.6 88.5  PLT 408 447* 422*   Cardiac Enzymes: No results for input(s): CKTOTAL, CKMB, CKMBINDEX, TROPONINI in the last 168 hours. BNP: Invalid input(s): POCBNP CBG:  Recent Labs Lab 07/08/16 0752 07/09/16 0807  GLUCAP 95 108*   HbA1C: No results for input(s): HGBA1C in the last 72 hours. Urine analysis:    Component Value Date/Time   COLORURINE AMBER (A) 07/07/2016 1351   APPEARANCEUR CLEAR (A) 07/07/2016 1351   LABSPEC 1.006 07/07/2016 1351   PHURINE 6.0 07/07/2016 1351   GLUCOSEU NEGATIVE 07/07/2016 1351   HGBUR NEGATIVE 07/07/2016 1351   BILIRUBINUR MODERATE (A) 07/07/2016 1351   KETONESUR NEGATIVE 07/07/2016 1351   PROTEINUR NEGATIVE 07/07/2016 1351   NITRITE NEGATIVE 07/07/2016 1351   LEUKOCYTESUR NEGATIVE 07/07/2016 1351   Sepsis Labs: @LABRCNTIP (procalcitonin:4,lacticidven:4) )No results found for this or any previous visit (from the past 240 hour(s)).   Scheduled Meds: . feeding supplement (ENSURE ENLIVE)  237 mL Oral TID BM  . heparin  5,000 Units Subcutaneous Q8H  . mometasone-formoterol  2 puff  Inhalation BID  . multivitamin with minerals  1 tablet Oral Daily  . nicotine  21 mg Transdermal Daily  . piperacillin-tazobactam (ZOSYN)  IV  3.375 g Intravenous Q8H  . pneumococcal 23 valent vaccine  0.5 mL Intramuscular Tomorrow-1000   Continuous Infusions: . 0.9 % NaCl with KCl 40 mEq / L 75 mL/hr (07/09/16 0430)    Procedures/Studies: Ct Chest W Contrast  Result Date: 07/09/2016 CLINICAL DATA:  Jaundice.  Pancreatic mass. EXAM: CT CHEST WITH CONTRAST TECHNIQUE: Multidetector CT imaging of the chest was performed during intravenous contrast administration. CONTRAST:  93mL ISOVUE-300 IOPAMIDOL (ISOVUE-300) INJECTION 61% COMPARISON:  MRI abdomen 07/08/2016 FINDINGS: Cardiovascular: No significant vascular findings. Normal heart size. No pericardial effusion. Mediastinum/Nodes: No axillary or supraclavicular adenopathy. No mediastinal hilar adenopathy. There is fluid filled esophagus. No high-grade obstruction evident. Some thickening through the GE junction (image 135 series 2) Lungs/Pleura: Centrilobular emphysema the upper lobes. No suspicious pulmonary  nodules. Mild atelectasis in the medial LEFT lung base. Upper Abdomen: Relief of the biliary obstruction. Pancreas not imaged. Musculoskeletal: No aggressive osseous lesion. IMPRESSION: 1. No evidence of pulmonary metastasis. 2. No mediastinal lymphadenopathy. 3. Thickening through the GE junction with fluid-filled esophagus could represent esophagitis at GE junction. Recommend clinical correlation recent endoscopy. Electronically Signed   By: Suzy Bouchard M.D.   On: 07/09/2016 16:47   Mr 3d Recon At Scanner  Result Date: 07/08/2016 CLINICAL DATA:  Inpatient admitted with jaundice. Biliary ductal dilatation on recent sonogram. Recent abdominal pain. History of tobacco abuse and COPD. EXAM: MRI ABDOMEN WITHOUT CONTRAST  (INCLUDING MRCP) TECHNIQUE: Multiplanar multisequence MR imaging of the abdomen was performed. Heavily T2-weighted images of  the biliary and pancreatic ducts were obtained, and three-dimensional MRCP images were rendered by post processing. COMPARISON:  07/07/2016 right upper quadrant abdominal sonogram. FINDINGS: Lower chest: Grossly clear lung bases. Hepatobiliary: Normal liver size and configuration. No hepatic steatosis. No liver mass. Marked diffuse intrahepatic biliary ductal dilatation. Dilated common bile duct measuring 15 mm diameter proximally. Abrupt common bile duct stricture at the level of the pancreatic head with shelf-like margins. No choledocholithiasis. Distended gallbladder. No definite gallbladder wall thickening. No pericholecystic fluid. No cholelithiasis. Pancreas: There is a hypoenhancing 1.9 x 1.6 cm posterior pancreatic head mass (series 1103/ image 44), which abuts posteriorly the site of the common bile duct stricture. Normal caliber main pancreatic duct, noting the presence of pancreas divisum with drainage of the main pancreatic duct via an accessory duct of [place] with no definite communication of the main pancreatic duct with the common bile duct. No additional pancreatic masses. Spleen: Normal size. No mass. Adrenals/Urinary Tract: Normal adrenals. No hydronephrosis. Normal kidneys with no renal mass. Stomach/Bowel: Grossly normal stomach. Visualized small and large bowel is normal caliber, with no bowel wall thickening. Vascular/Lymphatic: Normal caliber abdominal aorta. Patent renal, splenic and hepatic veins. The pancreatic head mass may minimally abut the posterior wall of the proximal main portal vein, involving less than 30% of the main portal vein circumference (series 1103/ image 41), with no evidence of portal venous narrowing. Main, right and left portal veins are patent and normal caliber. Superior mesenteric vein, superior mesenteric artery and celiac axis are patent and uninvolved by the mass. No pathologically enlarged lymph nodes in the abdomen. Other: No abdominal ascites or focal fluid  collection. Musculoskeletal: No aggressive appearing focal osseous lesions. IMPRESSION: 1. Hypoenhancing 1.9 cm posterior pancreatic head mass, suggestive of primary pancreatic adenocarcinoma. Minimal potential main portal vein involvement by the mass as detailed. GI consultation advised for consideration of ERCP and endoscopic ultrasound for tissue sampling. 2. Marked diffuse intrahepatic biliary ductal dilatation. Gallbladder distention. Common bile duct dilation (15 mm diameter proximally). Malignant appearing common bile duct stricture at the level of the pancreatic head. 3. No pancreatic duct dilation, noting the presence of pancreas divisum. 4. No evidence of metastatic disease in the abdomen. Electronically Signed   By: Ilona Sorrel M.D.   On: 07/08/2016 17:30   Dg Ercp Biliary & Pancreatic Ducts  Result Date: 07/09/2016 CLINICAL DATA:  56 year old male with a history of ERCP and stent placement EXAM: INTRAOPERATIVE CHOLANGIOGRAM TECHNIQUE: Cholangiographic images from the C-arm fluoroscopic device were submitted for interpretation post-operatively. Please see the procedural report for the amount of contrast and the fluoroscopy time utilized. 1 minutes 47 seconds COMPARISON:  MR 07/08/2016 FINDINGS: Limited intraoperative fluoroscopic spot images during ERCP. Initial image demonstrates endoscope projecting over the upper abdomen with cannulation  of the ampulla. Images demonstrate partial opacification of the biliary ducts, with safety wire in place. IMPRESSION: Limited images during ERCP/stenting demonstrating partial opacification of the intrahepatic and extrahepatic biliary system Please refer to the dictated operative report for full details of intraoperative findings and procedure. Electronically Signed   By: Corrie Mckusick D.O.   On: 07/09/2016 12:38   Mr Abdomen With Mrcp W Contrast  Result Date: 07/08/2016 CLINICAL DATA:  Inpatient admitted with jaundice. Biliary ductal dilatation on recent  sonogram. Recent abdominal pain. History of tobacco abuse and COPD. EXAM: MRI ABDOMEN WITHOUT CONTRAST  (INCLUDING MRCP) TECHNIQUE: Multiplanar multisequence MR imaging of the abdomen was performed. Heavily T2-weighted images of the biliary and pancreatic ducts were obtained, and three-dimensional MRCP images were rendered by post processing. COMPARISON:  07/07/2016 right upper quadrant abdominal sonogram. FINDINGS: Lower chest: Grossly clear lung bases. Hepatobiliary: Normal liver size and configuration. No hepatic steatosis. No liver mass. Marked diffuse intrahepatic biliary ductal dilatation. Dilated common bile duct measuring 15 mm diameter proximally. Abrupt common bile duct stricture at the level of the pancreatic head with shelf-like margins. No choledocholithiasis. Distended gallbladder. No definite gallbladder wall thickening. No pericholecystic fluid. No cholelithiasis. Pancreas: There is a hypoenhancing 1.9 x 1.6 cm posterior pancreatic head mass (series 1103/ image 44), which abuts posteriorly the site of the common bile duct stricture. Normal caliber main pancreatic duct, noting the presence of pancreas divisum with drainage of the main pancreatic duct via an accessory duct of [place] with no definite communication of the main pancreatic duct with the common bile duct. No additional pancreatic masses. Spleen: Normal size. No mass. Adrenals/Urinary Tract: Normal adrenals. No hydronephrosis. Normal kidneys with no renal mass. Stomach/Bowel: Grossly normal stomach. Visualized small and large bowel is normal caliber, with no bowel wall thickening. Vascular/Lymphatic: Normal caliber abdominal aorta. Patent renal, splenic and hepatic veins. The pancreatic head mass may minimally abut the posterior wall of the proximal main portal vein, involving less than 30% of the main portal vein circumference (series 1103/ image 41), with no evidence of portal venous narrowing. Main, right and left portal veins are  patent and normal caliber. Superior mesenteric vein, superior mesenteric artery and celiac axis are patent and uninvolved by the mass. No pathologically enlarged lymph nodes in the abdomen. Other: No abdominal ascites or focal fluid collection. Musculoskeletal: No aggressive appearing focal osseous lesions. IMPRESSION: 1. Hypoenhancing 1.9 cm posterior pancreatic head mass, suggestive of primary pancreatic adenocarcinoma. Minimal potential main portal vein involvement by the mass as detailed. GI consultation advised for consideration of ERCP and endoscopic ultrasound for tissue sampling. 2. Marked diffuse intrahepatic biliary ductal dilatation. Gallbladder distention. Common bile duct dilation (15 mm diameter proximally). Malignant appearing common bile duct stricture at the level of the pancreatic head. 3. No pancreatic duct dilation, noting the presence of pancreas divisum. 4. No evidence of metastatic disease in the abdomen. Electronically Signed   By: Ilona Sorrel M.D.   On: 07/08/2016 17:30   US Abdomen Limited Ruq  Result Date: 07/07/2016 CLINICAL DATA:  Jaundice and pain EXAM: US ABDOMEN LIMITED - RIGHT UPPER QUADRANT COMPARISON:  None. FINDINGS: Gallbladder: The gallbladder is distended with borderline wall thickening. No gallstones or pericholecystic fluid evident. No sonographic Murphy sign noted by sonographer. Common bile duct: Diameter: 15 mm common distended. There is generalized intrahepatic biliary duct dilatation. There is common hepatic and common bile duct dilatation without evident mass or calculus. No obvious pancreatic head lesion seen by ultrasound. Liver: No focal lesion  identified. Within normal limits in parenchymal echogenicity. There is intrahepatic biliary duct dilatation. IMPRESSION: Distended gallbladder with slight gallbladder wall thickening. There may be early acalculus cholecystitis. There is generalized intrahepatic and extrahepatic biliary duct dilatation without mass or  calculus evident. Etiology uncertain. This finding may warrant MRCP to further assess. No parenchymal liver lesions evident. Electronically Signed   By: Lowella Grip III M.D.   On: 07/07/2016 13:54    Iban Utz, DO  [facility] Pager [phone]  If 7PM-7AM, please contact night-coverage [email or web address] Password [email or web address] 07/09/2016, 5:43 PM   LOS: 2 days

## 2016-07-09 NOTE — Transfer of Care (Signed)
Immediate Anesthesia Transfer of Care Note  Patient: Benjamin Diaz.  Procedure(s) Performed: Procedure(s): ENDOSCOPIC RETROGRADE CHOLANGIOPANCREATOGRAPHY (ERCP) (N/A)  Patient Location: PACU  Anesthesia Type:General  Level of Consciousness: sedated  Airway & Oxygen Therapy: Patient Spontanous Breathing and Patient connected to face mask oxygen  Post-op Assessment: Report given to RN and Post -op Vital signs reviewed and stable  Post vital signs: Reviewed and stable  Last Vitals:  Vitals:   07/09/16 0403 07/09/16 0959  BP: 108/71 134/82  Pulse: 76 71  Resp: 16 19  Temp: 36.7 C 36.8 C    Last Pain:  Vitals:   07/09/16 0959  TempSrc: Oral  PainSc:       Patients Stated Pain Goal: 0 (66/29/47 6546)  Complications: No apparent anesthesia complications

## 2016-07-09 NOTE — Interval H&P Note (Signed)
History and Physical Interval Note:  07/09/2016 11:10 AM  Benjamin Diaz.  has presented today for surgery, with the diagnosis of obstructive jaundice  The various methods of treatment have been discussed with the patient and family. After consideration of risks, benefits and other options for treatment, the patient has consented to  Procedure(s): ENDOSCOPIC RETROGRADE CHOLANGIOPANCREATOGRAPHY (ERCP) (N/A) as a surgical intervention .  The patient's history has been reviewed, patient examined, no change in status, stable for surgery.  I have reviewed the patient's chart and labs.  Questions were answered to the patient's satisfaction.     [name] C

## 2016-07-09 NOTE — Op Note (Signed)
[facility] Patient Name: Benjamin Diaz Procedure Date: 07/09/2016 MRN: [id number] Attending MD: Missy Sabins , MD Date of Birth: Dec 29, 1960 CSN: [id number] Age: 56 Admit Type: Inpatient Procedure:                ERCP Indications:              Jaundice Providers:                Elyse Jarvis. Amedeo Plenty, MD, Elmer Ramp. Tilden Dome, RN, Alfonso Patten,                            Technician, Friendship Alday CRNA, CRNA Referring MD:              Medicines:                General Anesthesia Complications:            No immediate complications. Estimated Blood Loss:     Estimated blood loss: none. Procedure:                Pre-Anesthesia Assessment:                           - Prior to the procedure, a History and Physical                            was performed, and patient medications and                            allergies were reviewed. The patient's tolerance of                            previous anesthesia was also reviewed. The risks                            and benefits of the procedure and the sedation                            options and risks were discussed with the patient.                            All questions were answered, and informed consent                            was obtained. Prior Anticoagulants: The patient has                            taken no previous anticoagulant or antiplatelet                            agents. ASA Grade Assessment: II - A patient with                            mild systemic disease. After reviewing the risks  and benefits, the patient was deemed in                            satisfactory condition to undergo the procedure.                           After obtaining informed consent, the scope was                            passed under direct vision. Throughout the                            procedure, the patient's blood pressure, pulse, and                            oxygen saturations were monitored  continuously. The                            [id number]) scope was introduced through                            the mouth, and used to inject contrast into and                            used to inject contrast into the bile duct. The                            ERCP was accomplished without difficulty. The                            patient tolerated the procedure well. Scope In: Scope Out: Findings:      The bile duct was cannulated and opacified. I personally interpreted the       bile duct images. Ductal flow of contrast was adequate. Image quality       was excellent. Contrast extended to the entire biliary tree. The lower       third of the main bile duct was partially obstructed by what appeared to       be a mass. Opacification of the main bile duct was successful. The       maximum diameter of the ducts was 15 mm. A 3 mm biliary sphincterotomy       was made with a traction (standard) sphincterotome. There was no       post-sphincterotomy bleeding. Cells for cytology were obtained by       brushing. One 10 Fr by 5 cm plastic stent with two internal flaps was       placed into the common bile duct. Bile flowed through the stent. The       stent was in good position. Impression:               - A biliary tract obstruction secondary to what                            appeared to be a mass was found in the lower third  of the main duct.                           - A biliary sphincterotomy was performed.                           - One plastic stent was placed into the common bile                            duct. Moderate Sedation:      no moderate sedation Recommendation:           - Await cytology results.                           - Check liver enzymes (AST, ALT, alkaline                            phosphatase, bilirubin) tomorrow. Procedure Code(s):        --- Professional ---                           [phone], Endoscopic retrograde                             cholangiopancreatography (ERCP); with placement of                            endoscopic stent into biliary or pancreatic duct,                            including pre- and post-dilation and guide wire                            passage, when performed, including sphincterotomy,                            when performed, each stent                           [id number], Endoscopic catheterization of the biliary                            ductal system, radiological supervision and                            interpretation Diagnosis Code(s):        --- Professional ---                           K83.1, Obstruction of bile duct                           R17, Unspecified jaundice CPT copyright 2016 [employer]. All rights reserved. The codes documented in this report are preliminary and upon coder review may  be revised to meet current compliance requirements. Missy Sabins, MD 07/09/2016 12:15:34 PM This report has been signed electronically.  Number of Addenda: 0 

## 2016-07-09 NOTE — Consult Note (Signed)
Referral MD  Reason for Referral: Painless jaundice-pancreatic mass   No chief complaint on file. : I turned yellow a couple days ago.  HPI: [name] is a very nice 56 year old white male. He is originally from [place]. His father was in the [employer].  He lives in [place].  He's noticed back in April 1 that he was starting to turn yellow. He was not having any nausea or vomiting. He may have had some upper abdominal pain. He has some pain in the back.  He had no cough. He had no shortness of breath. He had no bleeding.  He ultimately became more yellow. He went to the emergency room. His final be extremely jaundiced. This is at [facility]. Surprisingly enough, they had nobody to help him out. I suppose that they did not have any gastroenterologist there who could do a upper endoscopy in ERCP.  He is transferred over to [facility]. His labs showed a bilirubin of 28.5. His AST and ALT were elevated. His lipase was normal. He had a minimally elevated white cell count. He had good potassium. His glucose was 97.  He had a MRI done. This showed a 1.9 x 1.6 cm mass in the posterior ankle reticulocyte head. This abuts the bile duct. He had marked intrahepatic biliary dilatation. There were no hepatic lesions. There is no adenopathy. The exact head mass minimally abutted the proximal main portal vein. There was total patency of the peri-pancreatic arteries.  He was seen by gastroenterology. A stent was placed. This was done successfully by Dr. Amedeo Plenty. As always, Dr. Amedeo Plenty did a phenomenal job.  Brushings were done. The pathology is not back yet.  He was seen by surgery. Since there is no pathology back it, did not make any formal recommendations as far as surgical interventions.  He did have a CT scan of the chest. This was negative for any obvious metastatic disease.  He does smoke. He does drink alcohol. He drinks maybe 2 beers a night. He says that it helps him  rest.  He's had no weight loss. He's had no obvious change in bowel or bladder habits. He's had no leg swelling. He's had no rashes. He has had some pruritus.  Overall, he's been pretty active. He actually looks quite good. I would say that his performance status is ECOG 1.    Past Medical History:  Diagnosis Date  . Asthma   . Atrial fibrillation ([place])   . COPD (chronic obstructive pulmonary disease) ([place])   . Pneumonia   :  Past Surgical History:  Procedure Laterality Date  . ERCP N/A 07/09/2016   Procedure: ENDOSCOPIC RETROGRADE CHOLANGIOPANCREATOGRAPHY (ERCP);  Surgeon: [name], MD;  Location: [facility];  Service: Endoscopy;  Laterality: N/A;  . KNEE SURGERY  2004  . SHOULDER SURGERY  2002  :   Current Facility-Administered Medications:  .  0.9 % NaCl with KCl 40 mEq / L  infusion, , Intravenous, Continuous, Orson Eva, MD, Last Rate: 75 mL/hr at 07/09/16 0430, 75 mL/hr at 07/09/16 0430 .  albuterol (PROVENTIL) (2.5 MG/3ML) 0.083% nebulizer solution 2.5 mg, 2.5 mg, Nebulization, Q4H PRN, Ilene Qua Opyd, MD .  feeding supplement (ENSURE ENLIVE) (ENSURE ENLIVE) liquid 237 mL, 237 mL, Oral, TID BM, Orson Eva, MD, 237 mL at 07/09/16 1442 .  heparin injection 5,000 Units, 5,000 Units, Subcutaneous, Q8H, Vianne Bulls, MD, 5,000 Units at 07/09/16 1348 .  mometasone-formoterol (DULERA) 200-5 MCG/ACT inhaler 2 puff, 2 puff, Inhalation,  BID, Vianne Bulls, MD, 2 puff at 07/08/16 2038 .  multivitamin with minerals tablet 1 tablet, 1 tablet, Oral, Daily, Orson Eva, MD, 1 tablet at 07/09/16 1442 .  nicotine (NICODERM CQ - dosed in mg/24 hours) patch 21 mg, 21 mg, Transdermal, Daily, Vianne Bulls, MD, 21 mg at 07/09/16 0928 .  ondansetron (ZOFRAN) tablet 4 mg, 4 mg, Oral, Q6H PRN **OR** ondansetron (ZOFRAN) injection 4 mg, 4 mg, Intravenous, Q6H PRN, Ilene Qua Opyd, MD, 4 mg at 07/09/16 1140 .  oxyCODONE (Oxy IR/ROXICODONE) immediate release tablet 5-10 mg, 5-10 mg, Oral, Q4H PRN,  Vianne Bulls, MD, 5 mg at 07/08/16 0758 .  piperacillin-tazobactam (ZOSYN) IVPB 3.375 g, 3.375 g, Intravenous, Q8H, Ilene Qua Opyd, MD, 3.375 g at 07/09/16 1650 .  pneumococcal 23 valent vaccine (PNU-IMMUNE) injection 0.5 mL, 0.5 mL, Intramuscular, Tomorrow-1000, Timothy S Opyd, MD:  . feeding supplement (ENSURE ENLIVE)  237 mL Oral TID BM  . heparin  5,000 Units Subcutaneous Q8H  . mometasone-formoterol  2 puff Inhalation BID  . multivitamin with minerals  1 tablet Oral Daily  . nicotine  21 mg Transdermal Daily  . piperacillin-tazobactam (ZOSYN)  IV  3.375 g Intravenous Q8H  . pneumococcal 23 valent vaccine  0.5 mL Intramuscular Tomorrow-1000  :  No Known Allergies:  Family History  Problem Relation Age of Onset  . Heart disease Father   :  Social History   Social History  . Marital status: Single    Spouse name: N/A  . Number of children: N/A  . Years of education: N/A   Occupational History  . Not on file.   Social History Main Topics  . Smoking status: Current Every Day Smoker    Packs/day: 1.00    Years: 40.00    Types: Cigarettes  . Smokeless tobacco: Never Used  . Alcohol use 4.2 oz/week    7 Cans of beer per week  . Drug use: Unknown  . Sexual activity: Not on file   Other Topics Concern  . Not on file   Social History Narrative  . No narrative on file  :  Pertinent items are noted in HPI.  Exam: Patient Vitals for the past 24 hrs:  BP Temp Temp src Pulse Resp SpO2 Weight  07/09/16 1413 115/75 97.4 F (36.3 C) Oral 75 16 97 % -  07/09/16 1309 123/81 - - 68 16 97 % -  07/09/16 1250 140/86 - - - - - -  07/09/16 1245 - - - 72 18 94 % -  07/09/16 1240 (!) 140/93 - - 72 (!) 30 95 % -  07/09/16 1235 - - - 74 15 100 % -  07/09/16 1230 128/84 - - 76 (!) 28 100 % -  07/09/16 1225 (!) 154/85 97.6 F (36.4 C) Oral 80 (!) 31 98 % -  07/09/16 1220 (!) 154/85 - - 82 (!) 26 99 % -  07/09/16 1218 124/74 - - 78 (!) 25 100 % -  07/09/16 0959 134/82 98.2 F  (36.8 C) Oral 71 19 95 % -  07/09/16 0403 108/71 98 F (36.7 C) Oral 76 16 98 % -  07/09/16 0142 - - - - - - 118 lb 2.7 oz (53.6 kg)  07/08/16 2039 116/79 97.2 F (36.2 C) Oral 82 18 99 % -  07/08/16 2038 - - - - - 94 % -   As above    Recent Labs  07/08/16 0425 07/09/16 0414  WBC 10.2  12.0*  HGB 11.8* 11.2*  HCT 32.8* 31.5*  PLT 447* 422*    Recent Labs  07/08/16 0425 07/09/16 0414  NA 130* 129*  K 3.0* 3.6  CL 91* 94*  CO2 30 28  GLUCOSE 100* 97  BUN 8 6  CREATININE <0.30* <0.30*  CALCIUM 8.8* 8.8*    Blood smear review:  None  Pathology: Pending     Assessment and Plan:  Mr. Shin is a 56 year old white male. He has obstructive jaundice. At believe that this is going to be pancreatic cancer.  From what I have seen, this looks like a stage I tumor area and there is no obvious adenopathy. I don't see any vascular involvement that would preclude surgery.  I suppose that if he does have surgery, the surgeons will want to wait for his obstructive jaundice to resolve.  I do not see any role for chemotherapy. If, for some reason, the surgeons feel that this tumor is not surgically resectable or borderline resectable, then we can always use chemotherapy with or without radiation therapy.  Mr. Donaldson definitely is in good enough shape to handle aggressive surgery.  I don't see that any other tests need to be done. We will have to see how his jaundice resolves. It looks like the biliary stent is in good position.  Obviously, nutrition will be critical.  He is a very nice guy.  I answered all his questions.  We will follow along. However, I just don't see that we will have to do anything with respect to therapy.   Lattie Haw, MD  [place] [phone]

## 2016-07-09 NOTE — Progress Notes (Addendum)
Pt getting ERCP today.  MR shows pancreatic head mass with no signs of metastatic disease.  Will order Ca 19-9 and Chest CT for metastatic w/u.  Will need biopsy of mass either by EUS or CT guided in IR if ERCP brushings are neg.  Will see PRN over the weekend.  Rosario Adie, MD  Colorectal and [facility]

## 2016-07-09 NOTE — Progress Notes (Signed)
Patient back to unit from Endo, s/p ERCP. Alert and oriented x 3. No c/o pain. Vital signs obtained,unremarkable.Will continue to monitor.

## 2016-07-09 NOTE — Progress Notes (Addendum)
Initial Nutrition Assessment  DOCUMENTATION CODES:   Severe malnutrition in context of chronic illness  INTERVENTION:   Ensure Enlive po TID, each supplement provides 350 kcal and 20 grams of protein  Magic cup TID with meals, each supplement provides 290 kcal and 9 grams of protein  MVI  NUTRITION DIAGNOSIS:   Malnutrition (severe) related to pancreatic mass as evidenced by severe depletion of muscle mass, severe depletion of body fat.  GOAL:   Patient will meet greater than or equal to 90% of their needs  MONITOR:   PO intake, Supplement acceptance, Labs, Weight trends  REASON FOR ASSESSMENT:   Other (Comment) (low BMI)    ASSESSMENT:   56 year old male with a history of COPD, tobacco abuse, and alcohol dependence who presented in transfer from [facility] with painless jaundice. 07/07/2016 RUQ US--distended gallbladder with slightly wall thickening possibly reflecting a calculus cholecystitis. Generalized intrahepatic and extrahepatic ductal dilatation. MR shows pancreatic head mass with no signs of metastatic disease. Pt now s/p ERCP with brushing and stenting.    Met with pt in room today. Pt reports good appetite and oral intake pta and currently eating 100% meals. Per chart, pt has lost 7lbs(6%) in 3 months; this is not significant. Pt likes Ensure; RD will order. Plan for pt to possibly have biopsy of mass pending cytology results. Pt with hyponatremia; monitor and supplement as needed per MD discretion.   Medications reviewed and include: heparin, nicotine, zosyn, NaCl w/ KCl, zofran, oxycodone  Labs reviewed: Na 129(L), Cl 94(L), creat <0.3(L), Ca 8.8(L) adj. 10.24 wnl, alb 2.2(L), AlkPhos 670(H), AST 197(H), ALT 258(H), tbili 28.5(H) 12(H)  Nutrition-Focused physical exam completed. Findings are severe fat depletion in chest and upper arms, severe muscle depletion over entire body, and no edema.   Diet Order:  Diet regular Room service appropriate? Yes; Fluid  consistency: Thin  Skin:  Reviewed, no issues  Last BM:  4/12  Height:   Ht Readings from Last 1 Encounters:  07/07/16 6' (1.829 m)    Weight:   Wt Readings from Last 1 Encounters:  07/09/16 118 lb 2.7 oz (53.6 kg)    Ideal Body Weight:  80.9 kg  BMI:  Body mass index is 16.03 kg/m.  Estimated Nutritional Needs:   Kcal:  1800-2000kcal/day   Protein:  86-86g/day   Fluid:  >1.8L/day   EDUCATION NEEDS:   No education needs identified at this time  Koleen Distance, RD, LDN Pager #[phone]

## 2016-07-09 NOTE — Anesthesia Preprocedure Evaluation (Signed)
Anesthesia Evaluation  Patient identified by MRN, date of birth, ID band Patient awake    Reviewed: Allergy & Precautions, NPO status , Patient's Chart, lab work & pertinent test results  Airway Mallampati: I  TM Distance: >3 FB Neck ROM: Full    Dental no notable dental hx.    Pulmonary asthma , COPD, Current Smoker,    breath sounds clear to auscultation       Cardiovascular hypertension,  Rhythm:Regular Rate:Normal     Neuro/Psych    GI/Hepatic (+)     substance abuse  alcohol use, Obst jaundice   Endo/Other    Renal/GU      Musculoskeletal   Abdominal   Peds  Hematology   Anesthesia Other Findings   Reproductive/Obstetrics                             Anesthesia Physical Anesthesia Plan  ASA: III  Anesthesia Plan: General   Post-op Pain Management:    Induction: Intravenous  Airway Management Planned: Oral ETT  Additional Equipment:   Intra-op Plan:   Post-operative Plan: Extubation in OR  Informed Consent: I have reviewed the patients History and Physical, chart, labs and discussed the procedure including the risks, benefits and alternatives for the proposed anesthesia with the patient or authorized representative who has indicated his/her understanding and acceptance.   Dental advisory given  Plan Discussed with: CRNA  Anesthesia Plan Comments:         Anesthesia Quick Evaluation

## 2016-07-09 NOTE — H&P (View-Only) (Signed)
[facility] Surgery Consult Note  Amerigo Mcglory. 03/25/1961  [id number].    Requesting MD: Tat, MD Chief Complaint/Reason for Consult: gallbladder distention   HPI:  [name] is a 56 y.o. Male with multiple medical problems,  who presented to [facility] hospital as a transfer from [place] with a CC of painless jaundice. He reports noticing the jaundice roughly 2 weeks ago. He is a current smoker and reports drinking 2-3 beers per night.   ED workup: hyponatremia, hypokalemia, Alk phos 780, AST 198, ALT 274, total bilirubin 35.4, and leukocytosis (13,400). RUQ U/S significant for distended gallbladder with borderline wall thickening and obvious hepatic and common bile duct dilatation. General surgery has been asked to evaluate for possible cholecystectomy.   ROS: Review of Systems  Constitutional: Negative for chills and fever.  HENT: Negative.   Eyes: Negative.   Respiratory: Negative for cough and shortness of breath.   Cardiovascular: Negative for chest pain and palpitations.  Gastrointestinal: Negative for abdominal pain, blood in stool, diarrhea, melena, nausea and vomiting.  Genitourinary: Negative for dysuria and hematuria.  Musculoskeletal: Positive for back pain.  Skin: Positive for itching. Negative for rash.       Jaundice   Neurological: Negative.     Family History  Problem Relation Age of Onset  . Heart disease Father     Past Medical History:  Diagnosis Date  . Asthma   . Atrial fibrillation ([place])   . COPD (chronic obstructive pulmonary disease) ([place])   . Pneumonia     Past Surgical History:  Procedure Laterality Date  . KNEE SURGERY  2004  . SHOULDER SURGERY  2002    Social History:  reports that he has been smoking Cigarettes.  He has a 40.00 pack-year smoking history. He has never used smokeless tobacco. He reports that he drinks about 4.2 oz of alcohol per week . His drug history is not on file.  Allergies: No Known Allergies  Medications  Prior to Admission  Medication Sig Dispense Refill  . albuterol (PROVENTIL HFA) 108 (90 Base) MCG/ACT inhaler Inhale 2 puffs into the lungs every 4 (four) hours as needed for wheezing or shortness of breath. 18 g 6  . albuterol (PROVENTIL) (2.5 MG/3ML) 0.083% nebulizer solution Take 3 mLs (2.5 mg total) by nebulization every 6 (six) hours as needed for wheezing or shortness of breath. (Patient not taking: Reported on 07/07/2016) 75 mL 12  . budesonide-formoterol (SYMBICORT) 160-4.5 MCG/ACT inhaler Inhale 2 puffs into the lungs 2 (two) times daily.    Marland Kitchen gabapentin (NEURONTIN) 300 MG capsule Take 1 capsule (300 mg total) by mouth at bedtime. 30 capsule 3  . naproxen (NAPROSYN) 500 MG tablet Take 1 tablet (500 mg total) by mouth 2 (two) times daily as needed. 60 tablet 0    Blood pressure 108/90, pulse 77, temperature 98.2 F (36.8 C), temperature source Oral, resp. rate 16, height 6' (1.829 m), weight 53.1 kg (117 lb), SpO2 99 %. Physical Exam: Physical Exam  Results for orders placed or performed during the hospital encounter of 07/07/16 (from the past 48 hour(s))  Basic metabolic panel     Status: Abnormal   Collection Time: 07/07/16  7:33 PM  Result Value Ref Range   Sodium 128 (L) 135 - 145 mmol/L   Potassium 3.4 (L) 3.5 - 5.1 mmol/L   Chloride 89 (L) 101 - 111 mmol/L   CO2 31 22 - 32 mmol/L   Glucose, Bld 92 65 - 99 mg/dL  BUN 8 6 - 20 mg/dL   Creatinine, Ser <0.30 (L) 0.61 - 1.24 mg/dL   Calcium 9.0 8.9 - 10.3 mg/dL   GFR calc non Af Amer NOT CALCULATED >60 mL/min   GFR calc Af Amer NOT CALCULATED >60 mL/min    Comment: (NOTE) The eGFR has been calculated using the CKD EPI equation. This calculation has not been validated in all clinical situations. eGFR's persistently <60 mL/min signify possible Chronic Kidney Disease.    Anion gap 8 5 - 15  Acetaminophen level     Status: Abnormal   Collection Time: 07/07/16  7:33 PM  Result Value Ref Range   Acetaminophen (Tylenol), Serum  <10 (L) 10 - 30 ug/mL    Comment:        THERAPEUTIC CONCENTRATIONS VARY SIGNIFICANTLY. A RANGE OF 10-30 ug/mL MAY BE AN EFFECTIVE CONCENTRATION FOR MANY PATIENTS. HOWEVER, SOME ARE BEST TREATED AT CONCENTRATIONS OUTSIDE THIS RANGE. ACETAMINOPHEN CONCENTRATIONS >150 ug/mL AT 4 HOURS AFTER INGESTION AND >50 ug/mL AT 12 HOURS AFTER INGESTION ARE OFTEN ASSOCIATED WITH TOXIC REACTIONS.   Protime-INR     Status: None   Collection Time: 07/07/16  7:49 PM  Result Value Ref Range   Prothrombin Time 12.7 11.4 - 15.2 seconds   INR 0.95   Comprehensive metabolic panel     Status: Abnormal   Collection Time: 07/08/16  4:25 AM  Result Value Ref Range   Sodium 130 (L) 135 - 145 mmol/L   Potassium 3.0 (L) 3.5 - 5.1 mmol/L   Chloride 91 (L) 101 - 111 mmol/L   CO2 30 22 - 32 mmol/L   Glucose, Bld 100 (H) 65 - 99 mg/dL   BUN 8 6 - 20 mg/dL   Creatinine, Ser <0.30 (L) 0.61 - 1.24 mg/dL   Calcium 8.8 (L) 8.9 - 10.3 mg/dL   Total Protein 5.7 (L) 6.5 - 8.1 g/dL   Albumin 2.6 (L) 3.5 - 5.0 g/dL   AST 231 (H) 15 - 41 U/L   ALT 274 (H) 17 - 63 U/L   Alkaline Phosphatase 711 (H) 38 - 126 U/L   Total Bilirubin 31.2 (HH) 0.3 - 1.2 mg/dL    Comment: RESULTS CONFIRMED BY MANUAL DILUTION CRITICAL RESULT CALLED TO, READ BACK BY AND VERIFIED WITHBennie Dallas RN @ [phone] ON 07/08/16 BY C DAVIS    GFR calc non Af Amer NOT CALCULATED >60 mL/min   GFR calc Af Amer NOT CALCULATED >60 mL/min    Comment: (NOTE) The eGFR has been calculated using the CKD EPI equation. This calculation has not been validated in all clinical situations. eGFR's persistently <60 mL/min signify possible Chronic Kidney Disease.    Anion gap 9 5 - 15  CBC     Status: Abnormal   Collection Time: 07/08/16  4:25 AM  Result Value Ref Range   WBC 10.2 4.0 - 10.5 K/uL   RBC 3.66 (L) 4.22 - 5.81 MIL/uL   Hemoglobin 11.8 (L) 13.0 - 17.0 g/dL   HCT 32.8 (L) 39.0 - 52.0 %   MCV 89.6 78.0 - 100.0 fL   MCH 32.2 26.0 - 34.0 pg   MCHC  36.0 30.0 - 36.0 g/dL   RDW 17.9 (H) 11.5 - 15.5 %   Platelets 447 (H) 150 - 400 K/uL  Glucose, capillary     Status: None   Collection Time: 07/08/16  7:52 AM  Result Value Ref Range   Glucose-Capillary 95 65 - 99 mg/dL  Sodium, urine, random  Status: None   Collection Time: 07/08/16 12:02 PM  Result Value Ref Range   Sodium, Ur 131 mmol/L    Comment: Performed at [facility] [address] [place], [place] [address]  Creatinine, urine, random     Status: None   Collection Time: 07/08/16 12:02 PM  Result Value Ref Range   Creatinine, Urine 38.46 mg/dL    Comment: Performed at [facility] [address] [place], [place] [address]  Lipase, blood     Status: None   Collection Time: 07/08/16 12:20 PM  Result Value Ref Range   Lipase 43 11 - 51 U/L  Magnesium     Status: None   Collection Time: 07/08/16 12:20 PM  Result Value Ref Range   Magnesium 1.9 1.7 - 2.4 mg/dL   US Abdomen Limited Ruq  Result Date: 07/07/2016 CLINICAL DATA:  Jaundice and pain EXAM: US ABDOMEN LIMITED - RIGHT UPPER QUADRANT COMPARISON:  None. FINDINGS: Gallbladder: The gallbladder is distended with borderline wall thickening. No gallstones or pericholecystic fluid evident. No sonographic Murphy sign noted by sonographer. Common bile duct: Diameter: 15 mm common distended. There is generalized intrahepatic biliary duct dilatation. There is common hepatic and common bile duct dilatation without evident mass or calculus. No obvious pancreatic head lesion seen by ultrasound. Liver: No focal lesion identified. Within normal limits in parenchymal echogenicity. There is intrahepatic biliary duct dilatation. IMPRESSION: Distended gallbladder with slight gallbladder wall thickening. There may be early acalculus cholecystitis. There is generalized intrahepatic and extrahepatic biliary duct dilatation without mass or calculus evident. Etiology uncertain. This finding may warrant MRCP to further assess. No  parenchymal liver lesions evident. Electronically Signed   By: Lowella Grip III M.D.   On: 07/07/2016 13:54   Assessment/Plan Painless jaundice Hyperbilirubinemia  - RUQ U/S w/ GB distention and bilary dilatation, no cholelithiasis.  - Agree with MRCP to r/o malignancy followed by ERCP.  - There does not appear to be any signs of cholecystitis clinically.  GB distention most likely due to CBD obstruction.  general surgery will follow  transaminitis - hepatitis panel pending; possible underlying liver disease related to [facility] use and contributing to elevated LFT's Hyponatremia Hypokalemia COPD/tobacco abuse   Plan: MRCP/ERCP per GI   Rosario Adie, MD  Colorectal and [facility]

## 2016-07-10 DIAGNOSIS — K831 Obstruction of bile duct: Secondary | ICD-10-CM

## 2016-07-10 DIAGNOSIS — E43 Unspecified severe protein-calorie malnutrition: Secondary | ICD-10-CM

## 2016-07-10 LAB — COMPREHENSIVE METABOLIC PANEL
ALBUMIN: 2.4 g/dL — AB (ref 3.5–5.0)
ALK PHOS: 597 U/L — AB (ref 38–126)
ALT: 244 U/L — ABNORMAL HIGH (ref 17–63)
AST: 131 U/L — AB (ref 15–41)
Anion gap: 10 (ref 5–15)
BILIRUBIN TOTAL: 15.1 mg/dL — AB (ref 0.3–1.2)
BUN: 17 mg/dL (ref 6–20)
CALCIUM: 9.1 mg/dL (ref 8.9–10.3)
CO2: 29 mmol/L (ref 22–32)
Chloride: 91 mmol/L — ABNORMAL LOW (ref 101–111)
Creatinine, Ser: 0.54 mg/dL — ABNORMAL LOW (ref 0.61–1.24)
GFR calc Af Amer: >60 mL/min (ref >60)
GFR calc non Af Amer: >60 mL/min (ref >60)
GLUCOSE: 182 mg/dL — AB (ref 65–99)
POTASSIUM: 4.6 mmol/L (ref 3.5–5.1)
Sodium: 130 mmol/L — ABNORMAL LOW (ref 135–145)
TOTAL PROTEIN: 5.9 g/dL — AB (ref 6.5–8.1)

## 2016-07-10 LAB — GLUCOSE, CAPILLARY: Glucose-Capillary: 164 mg/dL — ABNORMAL HIGH (ref 65–99)

## 2016-07-10 MED ORDER — AMOXICILLIN-POT CLAVULANATE 875-125 MG PO TABS
1.0000 | ORAL_TABLET | Freq: Two times a day (BID) | ORAL | Status: DC
  Administered 2016-07-10 – 2016-07-11 (×3): 1 via ORAL
  Filled 2016-07-10 (×3): qty 1

## 2016-07-10 NOTE — Progress Notes (Signed)
1 Day Post-Op  Subjective: No complaints  Objective: Vital signs in last 24 hours: Temp:  [97.4 F (36.3 C)-98.2 F (36.8 C)] 98 F (36.7 C) (04/14 0433) Pulse Rate:  [68-85] 76 (04/14 0433) Resp:  [15-31] 18 (04/14 0433) BP: (110-154)/(74-93) 110/89 (04/14 0433) SpO2:  [94 %-100 %] 99 % (04/14 0433) Weight:  [56 kg (123 lb 7.3 oz)] 56 kg (123 lb 7.3 oz) (04/14 0144) Last BM Date: 07/08/16  Intake/Output from previous day: 04/13 0701 - 04/14 0700 In: 1803.8 [P.O.:600; I.V.:1203.8] Out: -  Intake/Output this shift: Total I/O In: 600 [P.O.:600] Out: -   General appearance: alert, cooperative and no distress Resp: clear to auscultation bilaterally Cardio: regular rate and rhythm GI: soft, nontender. good bs Extremities: extremities normal, atraumatic, no cyanosis or edema  Lab Results:   Recent Labs  07/08/16 0425 07/09/16 0414  WBC 10.2 12.0*  HGB 11.8* 11.2*  HCT 32.8* 31.5*  PLT 447* 422*   BMET  Recent Labs  07/08/16 0425 07/09/16 0414  NA 130* 129*  K 3.0* 3.6  CL 91* 94*  CO2 30 28  GLUCOSE 100* 97  BUN 8 6  CREATININE <0.30* <0.30*  CALCIUM 8.8* 8.8*   PT/INR  Recent Labs  07/07/16 1351 07/07/16 1949  LABPROT 12.7 12.7  INR 0.95 0.95   ABG No results for input(s): PHART, HCO3 in the last 72 hours.  Invalid input(s): PCO2, PO2  Studies/Results: Ct Chest W Contrast  Result Date: 07/09/2016 CLINICAL DATA:  Jaundice.  Pancreatic mass. EXAM: CT CHEST WITH CONTRAST TECHNIQUE: Multidetector CT imaging of the chest was performed during intravenous contrast administration. CONTRAST:  40mL ISOVUE-300 IOPAMIDOL (ISOVUE-300) INJECTION 61% COMPARISON:  MRI abdomen 07/08/2016 FINDINGS: Cardiovascular: No significant vascular findings. Normal heart size. No pericardial effusion. Mediastinum/Nodes: No axillary or supraclavicular adenopathy. No mediastinal hilar adenopathy. There is fluid filled esophagus. No high-grade obstruction evident. Some  thickening through the GE junction (image 135 series 2) Lungs/Pleura: Centrilobular emphysema the upper lobes. No suspicious pulmonary nodules. Mild atelectasis in the medial LEFT lung base. Upper Abdomen: Relief of the biliary obstruction. Pancreas not imaged. Musculoskeletal: No aggressive osseous lesion. IMPRESSION: 1. No evidence of pulmonary metastasis. 2. No mediastinal lymphadenopathy. 3. Thickening through the GE junction with fluid-filled esophagus could represent esophagitis at GE junction. Recommend clinical correlation recent endoscopy. Electronically Signed   By: Suzy Bouchard M.D.   On: 07/09/2016 16:47   Mr 3d Recon At Scanner  Result Date: 07/08/2016 CLINICAL DATA:  Inpatient admitted with jaundice. Biliary ductal dilatation on recent sonogram. Recent abdominal pain. History of tobacco abuse and COPD. EXAM: MRI ABDOMEN WITHOUT CONTRAST  (INCLUDING MRCP) TECHNIQUE: Multiplanar multisequence MR imaging of the abdomen was performed. Heavily T2-weighted images of the biliary and pancreatic ducts were obtained, and three-dimensional MRCP images were rendered by post processing. COMPARISON:  07/07/2016 right upper quadrant abdominal sonogram. FINDINGS: Lower chest: Grossly clear lung bases. Hepatobiliary: Normal liver size and configuration. No hepatic steatosis. No liver mass. Marked diffuse intrahepatic biliary ductal dilatation. Dilated common bile duct measuring 15 mm diameter proximally. Abrupt common bile duct stricture at the level of the pancreatic head with shelf-like margins. No choledocholithiasis. Distended gallbladder. No definite gallbladder wall thickening. No pericholecystic fluid. No cholelithiasis. Pancreas: There is a hypoenhancing 1.9 x 1.6 cm posterior pancreatic head mass (series 1103/ image 44), which abuts posteriorly the site of the common bile duct stricture. Normal caliber main pancreatic duct, noting the presence of pancreas divisum with drainage of the main pancreatic  duct via an accessory duct of [place] with no definite communication of the main pancreatic duct with the common bile duct. No additional pancreatic masses. Spleen: Normal size. No mass. Adrenals/Urinary Tract: Normal adrenals. No hydronephrosis. Normal kidneys with no renal mass. Stomach/Bowel: Grossly normal stomach. Visualized small and large bowel is normal caliber, with no bowel wall thickening. Vascular/Lymphatic: Normal caliber abdominal aorta. Patent renal, splenic and hepatic veins. The pancreatic head mass may minimally abut the posterior wall of the proximal main portal vein, involving less than 30% of the main portal vein circumference (series 1103/ image 41), with no evidence of portal venous narrowing. Main, right and left portal veins are patent and normal caliber. Superior mesenteric vein, superior mesenteric artery and celiac axis are patent and uninvolved by the mass. No pathologically enlarged lymph nodes in the abdomen. Other: No abdominal ascites or focal fluid collection. Musculoskeletal: No aggressive appearing focal osseous lesions. IMPRESSION: 1. Hypoenhancing 1.9 cm posterior pancreatic head mass, suggestive of primary pancreatic adenocarcinoma. Minimal potential main portal vein involvement by the mass as detailed. GI consultation advised for consideration of ERCP and endoscopic ultrasound for tissue sampling. 2. Marked diffuse intrahepatic biliary ductal dilatation. Gallbladder distention. Common bile duct dilation (15 mm diameter proximally). Malignant appearing common bile duct stricture at the level of the pancreatic head. 3. No pancreatic duct dilation, noting the presence of pancreas divisum. 4. No evidence of metastatic disease in the abdomen. Electronically Signed   By: Ilona Sorrel M.D.   On: 07/08/2016 17:30   Dg Ercp Biliary & Pancreatic Ducts  Result Date: 07/09/2016 CLINICAL DATA:  56 year old male with a history of ERCP and stent placement EXAM: INTRAOPERATIVE  CHOLANGIOGRAM TECHNIQUE: Cholangiographic images from the C-arm fluoroscopic device were submitted for interpretation post-operatively. Please see the procedural report for the amount of contrast and the fluoroscopy time utilized. 1 minutes 47 seconds COMPARISON:  MR 07/08/2016 FINDINGS: Limited intraoperative fluoroscopic spot images during ERCP. Initial image demonstrates endoscope projecting over the upper abdomen with cannulation of the ampulla. Images demonstrate partial opacification of the biliary ducts, with safety wire in place. IMPRESSION: Limited images during ERCP/stenting demonstrating partial opacification of the intrahepatic and extrahepatic biliary system Please refer to the dictated operative report for full details of intraoperative findings and procedure. Electronically Signed   By: Corrie Mckusick D.O.   On: 07/09/2016 12:38   Mr Abdomen With Mrcp W Contrast  Result Date: 07/08/2016 CLINICAL DATA:  Inpatient admitted with jaundice. Biliary ductal dilatation on recent sonogram. Recent abdominal pain. History of tobacco abuse and COPD. EXAM: MRI ABDOMEN WITHOUT CONTRAST  (INCLUDING MRCP) TECHNIQUE: Multiplanar multisequence MR imaging of the abdomen was performed. Heavily T2-weighted images of the biliary and pancreatic ducts were obtained, and three-dimensional MRCP images were rendered by post processing. COMPARISON:  07/07/2016 right upper quadrant abdominal sonogram. FINDINGS: Lower chest: Grossly clear lung bases. Hepatobiliary: Normal liver size and configuration. No hepatic steatosis. No liver mass. Marked diffuse intrahepatic biliary ductal dilatation. Dilated common bile duct measuring 15 mm diameter proximally. Abrupt common bile duct stricture at the level of the pancreatic head with shelf-like margins. No choledocholithiasis. Distended gallbladder. No definite gallbladder wall thickening. No pericholecystic fluid. No cholelithiasis. Pancreas: There is a hypoenhancing 1.9 x 1.6 cm  posterior pancreatic head mass (series 1103/ image 44), which abuts posteriorly the site of the common bile duct stricture. Normal caliber main pancreatic duct, noting the presence of pancreas divisum with drainage of the main pancreatic duct via an accessory duct of [place] with  no definite communication of the main pancreatic duct with the common bile duct. No additional pancreatic masses. Spleen: Normal size. No mass. Adrenals/Urinary Tract: Normal adrenals. No hydronephrosis. Normal kidneys with no renal mass. Stomach/Bowel: Grossly normal stomach. Visualized small and large bowel is normal caliber, with no bowel wall thickening. Vascular/Lymphatic: Normal caliber abdominal aorta. Patent renal, splenic and hepatic veins. The pancreatic head mass may minimally abut the posterior wall of the proximal main portal vein, involving less than 30% of the main portal vein circumference (series 1103/ image 41), with no evidence of portal venous narrowing. Main, right and left portal veins are patent and normal caliber. Superior mesenteric vein, superior mesenteric artery and celiac axis are patent and uninvolved by the mass. No pathologically enlarged lymph nodes in the abdomen. Other: No abdominal ascites or focal fluid collection. Musculoskeletal: No aggressive appearing focal osseous lesions. IMPRESSION: 1. Hypoenhancing 1.9 cm posterior pancreatic head mass, suggestive of primary pancreatic adenocarcinoma. Minimal potential main portal vein involvement by the mass as detailed. GI consultation advised for consideration of ERCP and endoscopic ultrasound for tissue sampling. 2. Marked diffuse intrahepatic biliary ductal dilatation. Gallbladder distention. Common bile duct dilation (15 mm diameter proximally). Malignant appearing common bile duct stricture at the level of the pancreatic head. 3. No pancreatic duct dilation, noting the presence of pancreas divisum. 4. No evidence of metastatic disease in the abdomen.  Electronically Signed   By: Ilona Sorrel M.D.   On: 07/08/2016 17:30    Anti-infectives: Anti-infectives    Start     Dose/Rate Route Frequency Ordered Stop   07/09/16 0900  ciprofloxacin (CIPRO) IVPB 400 mg     400 mg 200 mL/hr over 60 Minutes Intravenous  Once 07/08/16 1149 07/09/16 1135   07/07/16 2200  piperacillin-tazobactam (ZOSYN) IVPB 3.375 g     3.375 g 12.5 mL/hr over 240 Minutes Intravenous Every 8 hours 07/07/16 1944        Assessment/Plan: s/p Procedure(s): ENDOSCOPIC RETROGRADE CHOLANGIOPANCREATOGRAPHY (ERCP) (N/A) pancreatic head mass. s/p ercp with brushings. Await path. Will discuss with Dr. Barry Dienes next week  LOS: 3 days    TOTH III,PAUL S 07/10/2016

## 2016-07-10 NOTE — Progress Notes (Signed)
Subjective: Doing well after ERCP. No complaints.  Objective: Vital signs in last 24 hours: Temp:  [97.4 F (36.3 C)-98 F (36.7 C)] 98 F (36.7 C) (04/14 0433) Pulse Rate:  [68-85] 76 (04/14 0433) Resp:  [16-18] 18 (04/14 0433) BP: (110-140)/(75-89) 110/89 (04/14 0433) SpO2:  [93 %-99 %] 93 % (04/14 0825) Weight:  [56 kg (123 lb 7.3 oz)] 56 kg (123 lb 7.3 oz) (04/14 0144) Weight change: 2.4 kg (5 lb 4.7 oz) Last BM Date: 07/10/16  PE: GEN:  Thin and cachectic-appearing SKIN:  Jaundiced, several tattoos ABD:  Soft, non-tender HEENT:  Scleral icterus  Lab Results: CBC    Component Value Date/Time   WBC 12.0 (H) 07/09/2016 0414   RBC 3.56 (L) 07/09/2016 0414   HGB 11.2 (L) 07/09/2016 0414   HCT 31.5 (L) 07/09/2016 0414   PLT 422 (H) 07/09/2016 0414   MCV 88.5 07/09/2016 0414   MCH 31.5 07/09/2016 0414   MCHC 35.6 07/09/2016 0414   RDW 17.6 (H) 07/09/2016 0414   CMP     Component Value Date/Time   NA 130 (L) 07/10/2016 0434   NA 139 01/23/2014   K 4.6 07/10/2016 0434   CL 91 (L) 07/10/2016 0434   CO2 29 07/10/2016 0434   GLUCOSE 182 (H) 07/10/2016 0434   BUN 17 07/10/2016 0434   BUN 5 01/23/2014   CREATININE 0.54 (L) 07/10/2016 0434   CALCIUM 9.1 07/10/2016 0434   PROT 5.9 (L) 07/10/2016 0434   ALBUMIN 2.4 (L) 07/10/2016 0434   AST 131 (H) 07/10/2016 0434   ALT 244 (H) 07/10/2016 0434   ALKPHOS 597 (H) 07/10/2016 0434   BILITOT 15.1 (H) 07/10/2016 0434   GFRNONAA >60 07/10/2016 0434   GFRAA >60 07/10/2016 0434   Assessment:  1.  Obstructive jaundice, improving after ERCP with biliary stent placement. 2.  Pancreatic mass, worrisome for adenocarcinoma. 3.  Weight loss. 4.  Distal bile duct stricture, brushed for cytology.  Plan:  1.  LFTs much improved today; will continue to follow. 2.  Change antibiotics to oral formulation. 3.  If LFTs continue to improve and no major issues arise, I think discharge home tomorrow (to complete rest of work-up as  outpatient) is reasonable, but patient seems a bit nervous about this. 4.  Regular diet. 5.  [facility] will revisit tomorrow.   WAH, SABIC 07/10/2016, 12:45 PM   Pager [phone] If no answer or after 5 PM call [phone]

## 2016-07-10 NOTE — Progress Notes (Signed)
PROGRESS NOTE  Benjamin Diaz. YOV:[id number] DOB: [birth year] DOA: 07/07/2016 PCP: Boyce Medici, FNP  Brief History: 56 year old male with a history of COPD, tobacco abuse, and alcohol dependence who presented in transfer from [place] with painless jaundice. Patient reports that he had been in his usual state of health until approximately 10 days ago when he noted the development of jaundice. Since that time, he reports becoming increasingly yellow. He denies any fevers or chills and denies any abdominal pain, nausea, vomiting, or diarrhea. The patient initially thought this may have been partly due to gabapentin and amlodipine that he recently started. He discontinued these medications without improvement. He denies any other illegal or over-the-counter drug use. In the emergency department, the patient was noted to have elevated transaminases and a bilirubin of 35.4. Ultrasound of the right upper quadrant showed a distended gallbladder with some wall thickening with dilated intra-and extrahepatic biliary ducts. The patient was transferred to [facility] for further GI work up.  MRCP revealed a 1.9 cm pancreatic head mass. ERCP was performed and a plastic stent was placed in the common bile duct. General surgery and medical oncology were consulted to assist.  Assessment/Plan: Hyperbilirubinemia/jaundice/Pancreatic Head mass/Biliary stricture  -07/07/2016 RUQ US--distended gallbladder with slightly wall thickening possibly reflecting a calculus cholecystitis. Generalized intrahepatic and extrahepatic ductal dilatation. -appreciate GI consult -MRCP--1.9 cm posterior pancreatic head mass suggestive of pancreatic adenocarcinoma; marked diffuse intrahepatic ductal dilatation with gallbladder distention -07/09/2016 ERCP--biliary tract obstruction secondary to mass in the lower third of the main duct--one plastic stent inserted into CBD -consulted general surgery-will need resection if malignancy  confirmed -4/13 CT chest--no pulm mets; GE junction thickening -check lipase--43 -check CA 19-9 -MedOnc consult appreciated--no role for chemo at this point; no mets -await cytology from ERCP brushing -Discontinue empiric zosyn--->amox/clav  Transaminasemia -hep B surface antigen--neg -hep C antibody--neg -HIV--neg -trending down -am CMP  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   Objective: Vitals:   07/10/16 0433 07/10/16 0823 07/10/16 0825 07/10/16 1411  BP: 110/89   108/65  Pulse: 76   97  Resp: 18   18  Temp: 98 F (36.7 C)   98.8 F (37.1 C)  TempSrc: Oral   Oral  SpO2: 99% 93% 93% 99%  Weight:      Height:        Intake/Output Summary (Last 24 hours) at 07/10/16 1641 Last data filed at 07/10/16 1300  Gross per 24 hour  Intake          2741.25 ml  Output                0 ml  Net          2741.25 ml   Weight change: 2.4 kg (5 lb 4.7 oz) Exam:   General:  Pt is alert, follows commands appropriately, not in acute distress  HEENT: No icterus, No thrush, No neck mass, [place]/AT  Cardiovascular: RRR, S1/S2, no rubs,  no gallops  Respiratory: CTA bilaterally, no wheezing, no crackles, no rhonchi  Abdomen: Soft/+BS, non tender, non distended, no guarding  Extremities: No edema, No lymphangitis, No petechiae, No rashes, no synovitis   Data  Reviewed: I have personally reviewed following labs and imaging studies Basic Metabolic Panel:  Recent Labs Lab 07/07/16 1211 07/07/16 1933 07/08/16 0425 07/08/16 1220 07/09/16 0414 07/10/16 0434  NA 125* 128* 130*  --  129* 130*  K 3.0* 3.4* 3.0*  --  3.6 4.6  CL 86* 89* 91*  --  94* 91*  CO2 29 31 30   --  28 29  GLUCOSE 147* 92 100*  --  97 182*  BUN 8 8 8   --  6 17  CREATININE NOT CALCULATED <0.30* <0.30*  --  <0.30* 0.54*  CALCIUM 9.1 9.0 8.8*  --  8.8* 9.1  MG  --   --   --  1.9  --   --    Liver Function Tests:  Recent Labs Lab 07/07/16 1211 07/08/16 0425 07/09/16 0414 07/10/16 0434  AST 198* 231* 197* 131*  ALT 274* 274* 258* 244*  ALKPHOS 779* 711* 670* 597*  BILITOT 35.4* 31.2* 28.5* 15.1*  PROT 6.3* 5.7* 5.4* 5.9*  ALBUMIN 2.8* 2.6* 2.2* 2.4*    Recent Labs Lab 07/08/16 1220  LIPASE 43   No results for input(s): AMMONIA in the last 168 hours. Coagulation Profile:  Recent Labs Lab 07/07/16 1351 07/07/16 1949  INR 0.95 0.95   CBC:  Recent Labs Lab 07/07/16 1211 07/08/16 0425 07/09/16 0414  WBC 13.4* 10.2 12.0*  HGB 13.8 11.8* 11.2*  HCT 40.1 32.8* 31.5*  MCV 95.0 89.6 88.5  PLT 408 447* 422*   Cardiac Enzymes: No results for input(s): CKTOTAL, CKMB, CKMBINDEX, TROPONINI in the last 168 hours. BNP: Invalid input(s): POCBNP CBG:  Recent Labs Lab 07/08/16 0752 07/09/16 0807 07/10/16 0725  GLUCAP 95 108* 164*   HbA1C: No results for input(s): HGBA1C in the last 72 hours. Urine analysis:    Component Value Date/Time   COLORURINE AMBER (A) 07/07/2016 1351   APPEARANCEUR CLEAR (A) 07/07/2016 1351   LABSPEC 1.006 07/07/2016 1351   PHURINE 6.0 07/07/2016 1351   GLUCOSEU NEGATIVE 07/07/2016 1351   HGBUR NEGATIVE 07/07/2016 1351   BILIRUBINUR MODERATE (A) 07/07/2016 1351   KETONESUR NEGATIVE 07/07/2016 1351   PROTEINUR NEGATIVE 07/07/2016 1351   NITRITE NEGATIVE 07/07/2016 1351   LEUKOCYTESUR NEGATIVE 07/07/2016 1351   Sepsis  Labs: @LABRCNTIP (procalcitonin:4,lacticidven:4) )No results found for this or any previous visit (from the past 240 hour(s)).   Scheduled Meds: . amoxicillin-clavulanate  1 tablet Oral Q12H  . feeding supplement (ENSURE ENLIVE)  237 mL Oral TID BM  . heparin  5,000 Units Subcutaneous Q8H  . mometasone-formoterol  2 puff Inhalation BID  . multivitamin with minerals  1 tablet Oral Daily  . nicotine  21 mg Transdermal Daily   Continuous Infusions: . 0.9 % NaCl with KCl 40 mEq / L 75 mL/hr (07/10/16 1302)    Procedures/Studies: Ct Chest W Contrast  Result Date: 07/09/2016 CLINICAL DATA:  Jaundice.  Pancreatic mass. EXAM: CT CHEST WITH CONTRAST TECHNIQUE: Multidetector CT imaging of the chest was performed during intravenous contrast administration. CONTRAST:  52mL ISOVUE-300 IOPAMIDOL (ISOVUE-300) INJECTION 61% COMPARISON:  MRI abdomen 07/08/2016 FINDINGS: Cardiovascular: No significant vascular findings. Normal heart size. No pericardial effusion. Mediastinum/Nodes: No axillary or supraclavicular adenopathy. No mediastinal hilar adenopathy. There is fluid filled esophagus. No high-grade obstruction evident. Some thickening through the  GE junction (image 135 series 2) Lungs/Pleura: Centrilobular emphysema the upper lobes. No suspicious pulmonary nodules. Mild atelectasis in the medial LEFT lung base. Upper Abdomen: Relief of the biliary obstruction. Pancreas not imaged. Musculoskeletal: No aggressive osseous lesion. IMPRESSION: 1. No evidence of pulmonary metastasis. 2. No mediastinal lymphadenopathy. 3. Thickening through the GE junction with fluid-filled esophagus could represent esophagitis at GE junction. Recommend clinical correlation recent endoscopy. Electronically Signed   By: Suzy Bouchard M.D.   On: 07/09/2016 16:47   Mr 3d Recon At Scanner  Result Date: 07/08/2016 CLINICAL DATA:  Inpatient admitted with jaundice. Biliary ductal dilatation on recent sonogram. Recent abdominal pain.  History of tobacco abuse and COPD. EXAM: MRI ABDOMEN WITHOUT CONTRAST  (INCLUDING MRCP) TECHNIQUE: Multiplanar multisequence MR imaging of the abdomen was performed. Heavily T2-weighted images of the biliary and pancreatic ducts were obtained, and three-dimensional MRCP images were rendered by post processing. COMPARISON:  07/07/2016 right upper quadrant abdominal sonogram. FINDINGS: Lower chest: Grossly clear lung bases. Hepatobiliary: Normal liver size and configuration. No hepatic steatosis. No liver mass. Marked diffuse intrahepatic biliary ductal dilatation. Dilated common bile duct measuring 15 mm diameter proximally. Abrupt common bile duct stricture at the level of the pancreatic head with shelf-like margins. No choledocholithiasis. Distended gallbladder. No definite gallbladder wall thickening. No pericholecystic fluid. No cholelithiasis. Pancreas: There is a hypoenhancing 1.9 x 1.6 cm posterior pancreatic head mass (series 1103/ image 44), which abuts posteriorly the site of the common bile duct stricture. Normal caliber main pancreatic duct, noting the presence of pancreas divisum with drainage of the main pancreatic duct via an accessory duct of [place] with no definite communication of the main pancreatic duct with the common bile duct. No additional pancreatic masses. Spleen: Normal size. No mass. Adrenals/Urinary Tract: Normal adrenals. No hydronephrosis. Normal kidneys with no renal mass. Stomach/Bowel: Grossly normal stomach. Visualized small and large bowel is normal caliber, with no bowel wall thickening. Vascular/Lymphatic: Normal caliber abdominal aorta. Patent renal, splenic and hepatic veins. The pancreatic head mass may minimally abut the posterior wall of the proximal main portal vein, involving less than 30% of the main portal vein circumference (series 1103/ image 41), with no evidence of portal venous narrowing. Main, right and left portal veins are patent and normal caliber. Superior  mesenteric vein, superior mesenteric artery and celiac axis are patent and uninvolved by the mass. No pathologically enlarged lymph nodes in the abdomen. Other: No abdominal ascites or focal fluid collection. Musculoskeletal: No aggressive appearing focal osseous lesions. IMPRESSION: 1. Hypoenhancing 1.9 cm posterior pancreatic head mass, suggestive of primary pancreatic adenocarcinoma. Minimal potential main portal vein involvement by the mass as detailed. GI consultation advised for consideration of ERCP and endoscopic ultrasound for tissue sampling. 2. Marked diffuse intrahepatic biliary ductal dilatation. Gallbladder distention. Common bile duct dilation (15 mm diameter proximally). Malignant appearing common bile duct stricture at the level of the pancreatic head. 3. No pancreatic duct dilation, noting the presence of pancreas divisum. 4. No evidence of metastatic disease in the abdomen. Electronically Signed   By: Ilona Sorrel M.D.   On: 07/08/2016 17:30   Dg Ercp Biliary & Pancreatic Ducts  Result Date: 07/09/2016 CLINICAL DATA:  56 year old male with a history of ERCP and stent placement EXAM: INTRAOPERATIVE CHOLANGIOGRAM TECHNIQUE: Cholangiographic images from the C-arm fluoroscopic device were submitted for interpretation post-operatively. Please see the procedural report for the amount of contrast and the fluoroscopy time utilized. 1 minutes 47 seconds COMPARISON:  MR 07/08/2016 FINDINGS: Limited intraoperative fluoroscopic  spot images during ERCP. Initial image demonstrates endoscope projecting over the upper abdomen with cannulation of the ampulla. Images demonstrate partial opacification of the biliary ducts, with safety wire in place. IMPRESSION: Limited images during ERCP/stenting demonstrating partial opacification of the intrahepatic and extrahepatic biliary system Please refer to the dictated operative report for full details of intraoperative findings and procedure. Electronically Signed    By: Corrie Mckusick D.O.   On: 07/09/2016 12:38   Mr Abdomen With Mrcp W Contrast  Result Date: 07/08/2016 CLINICAL DATA:  Inpatient admitted with jaundice. Biliary ductal dilatation on recent sonogram. Recent abdominal pain. History of tobacco abuse and COPD. EXAM: MRI ABDOMEN WITHOUT CONTRAST  (INCLUDING MRCP) TECHNIQUE: Multiplanar multisequence MR imaging of the abdomen was performed. Heavily T2-weighted images of the biliary and pancreatic ducts were obtained, and three-dimensional MRCP images were rendered by post processing. COMPARISON:  07/07/2016 right upper quadrant abdominal sonogram. FINDINGS: Lower chest: Grossly clear lung bases. Hepatobiliary: Normal liver size and configuration. No hepatic steatosis. No liver mass. Marked diffuse intrahepatic biliary ductal dilatation. Dilated common bile duct measuring 15 mm diameter proximally. Abrupt common bile duct stricture at the level of the pancreatic head with shelf-like margins. No choledocholithiasis. Distended gallbladder. No definite gallbladder wall thickening. No pericholecystic fluid. No cholelithiasis. Pancreas: There is a hypoenhancing 1.9 x 1.6 cm posterior pancreatic head mass (series 1103/ image 44), which abuts posteriorly the site of the common bile duct stricture. Normal caliber main pancreatic duct, noting the presence of pancreas divisum with drainage of the main pancreatic duct via an accessory duct of [place] with no definite communication of the main pancreatic duct with the common bile duct. No additional pancreatic masses. Spleen: Normal size. No mass. Adrenals/Urinary Tract: Normal adrenals. No hydronephrosis. Normal kidneys with no renal mass. Stomach/Bowel: Grossly normal stomach. Visualized small and large bowel is normal caliber, with no bowel wall thickening. Vascular/Lymphatic: Normal caliber abdominal aorta. Patent renal, splenic and hepatic veins. The pancreatic head mass may minimally abut the posterior wall of the  proximal main portal vein, involving less than 30% of the main portal vein circumference (series 1103/ image 41), with no evidence of portal venous narrowing. Main, right and left portal veins are patent and normal caliber. Superior mesenteric vein, superior mesenteric artery and celiac axis are patent and uninvolved by the mass. No pathologically enlarged lymph nodes in the abdomen. Other: No abdominal ascites or focal fluid collection. Musculoskeletal: No aggressive appearing focal osseous lesions. IMPRESSION: 1. Hypoenhancing 1.9 cm posterior pancreatic head mass, suggestive of primary pancreatic adenocarcinoma. Minimal potential main portal vein involvement by the mass as detailed. GI consultation advised for consideration of ERCP and endoscopic ultrasound for tissue sampling. 2. Marked diffuse intrahepatic biliary ductal dilatation. Gallbladder distention. Common bile duct dilation (15 mm diameter proximally). Malignant appearing common bile duct stricture at the level of the pancreatic head. 3. No pancreatic duct dilation, noting the presence of pancreas divisum. 4. No evidence of metastatic disease in the abdomen. Electronically Signed   By: Ilona Sorrel M.D.   On: 07/08/2016 17:30   US Abdomen Limited Ruq  Result Date: 07/07/2016 CLINICAL DATA:  Jaundice and pain EXAM: US ABDOMEN LIMITED - RIGHT UPPER QUADRANT COMPARISON:  None. FINDINGS: Gallbladder: The gallbladder is distended with borderline wall thickening. No gallstones or pericholecystic fluid evident. No sonographic Murphy sign noted by sonographer. Common bile duct: Diameter: 15 mm common distended. There is generalized intrahepatic biliary duct dilatation. There is common hepatic and common bile duct dilatation without evident  mass or calculus. No obvious pancreatic head lesion seen by ultrasound. Liver: No focal lesion identified. Within normal limits in parenchymal echogenicity. There is intrahepatic biliary duct dilatation. IMPRESSION:  Distended gallbladder with slight gallbladder wall thickening. There may be early acalculus cholecystitis. There is generalized intrahepatic and extrahepatic biliary duct dilatation without mass or calculus evident. Etiology uncertain. This finding may warrant MRCP to further assess. No parenchymal liver lesions evident. Electronically Signed   By: Lowella Grip III M.D.   On: 07/07/2016 13:54    Citlaly Camplin, DO  [facility] Pager [phone]  If 7PM-7AM, please contact night-coverage [email or web address] Password [email or web address] 07/10/2016, 4:41 PM   LOS: 3 days

## 2016-07-11 LAB — COMPREHENSIVE METABOLIC PANEL
ALBUMIN: 2.5 g/dL — AB (ref 3.5–5.0)
ALK PHOS: 450 U/L — AB (ref 38–126)
ALT: 173 U/L — AB (ref 17–63)
AST: 73 U/L — ABNORMAL HIGH (ref 15–41)
Anion gap: 7 (ref 5–15)
BUN: 17 mg/dL (ref 6–20)
CALCIUM: 9 mg/dL (ref 8.9–10.3)
CO2: 31 mmol/L (ref 22–32)
CREATININE: 0.5 mg/dL — AB (ref 0.61–1.24)
Chloride: 92 mmol/L — ABNORMAL LOW (ref 101–111)
GFR calc non Af Amer: >60 mL/min (ref >60)
Glucose, Bld: 120 mg/dL — ABNORMAL HIGH (ref 65–99)
Potassium: 4.2 mmol/L (ref 3.5–5.1)
SODIUM: 130 mmol/L — AB (ref 135–145)
Total Bilirubin: 9.9 mg/dL — ABNORMAL HIGH (ref 0.3–1.2)
Total Protein: 5.9 g/dL — ABNORMAL LOW (ref 6.5–8.1)

## 2016-07-11 LAB — GLUCOSE, CAPILLARY: Glucose-Capillary: 99 mg/dL (ref 65–99)

## 2016-07-11 LAB — CANCER ANTIGEN 19-9: CA 19 9: 258 U/mL — AB (ref 0–35)

## 2016-07-11 MED ORDER — ENSURE ENLIVE PO LIQD: 237.0000 mL | Freq: Three times a day (TID) | ORAL | 0 refills | Status: DC

## 2016-07-11 MED ORDER — AMOXICILLIN-POT CLAVULANATE 875-125 MG PO TABS: 1.0000 | ORAL_TABLET | Freq: Two times a day (BID) | ORAL | 0 refills | Status: DC

## 2016-07-11 NOTE — Discharge Summary (Addendum)
Physician Discharge Summary  Benjamin Diaz. EXN:[id number] DOB: 05-Apr-1960 DOA: 07/07/2016  PCP: Boyce Medici, FNP  Admit date: 07/07/2016 Discharge date: 07/11/2016  Admitted From: Home Disposition:  Home   Recommendations for Outpatient Follow-up:  1. Follow up with PCP in 1-2 weeks 2. Please obtain BMP/CBC/LFTs in one week  Discharge Condition: Stable CODE STATUS: FULL Diet recommendation:  Regular   Brief/Interim Summary: 56 year old male with a history of COPD, tobacco abuse, and alcohol dependence who presented in transfer from [place] with painless jaundice. Patient reports that he had been in his usual state of health until approximately 10 days ago when he noted the development of jaundice. Since that time, he reports becoming increasingly yellow. He denies any fevers or chills and denies any abdominal pain, nausea, vomiting, or diarrhea. The patient initially thought this may have been partly due to gabapentin and amlodipine that he recently started. He discontinued these medications without improvement. He denies any other illegal or over-the-counter drug use. In the emergency department, the patient was noted to have elevated transaminases and a bilirubin of 35.4. Ultrasound of the right upper quadrant showed a distended gallbladder with some wall thickening with dilated intra-and extrahepatic biliary ducts. The patient was transferred to [facility] for further GI work up. MRCP revealed a 1.9 cm pancreatic head mass. ERCP was performed and a plastic stent was placed in the common bile duct. General surgery and medical oncology were consulted to assist.  Discharge Diagnoses:  Hyperbilirubinemia/Pancreatic Head mass/Biliary Duct stricture/obstruction -07/07/2016 RUQ US--distended gallbladder with slightly wall thickening possibly reflecting a calculus cholecystitis. Generalized intrahepatic and extrahepatic ductal dilatation. -appreciate GI consult -MRCP--1.9 cm posterior  pancreatic head mass suggestive of pancreatic adenocarcinoma; marked diffuse intrahepatic ductal dilatation with gallbladder distention -07/09/2016 ERCP--biliary tract obstruction secondary to mass in the lower third of the main duct--one plastic stent inserted into CBD -consultedgeneral surgery-will need resection if malignancy confirmed -4/13 CT chest--no pulm mets; GE junction thickening -check lipase--43 -check CA 19-9 -MedOnc consult appreciated--no role for chemo at this point; no mets -await cytology from ERCP brushing--if neg will need EUS with bx -Discontinueempiric zosyn--->amox/clav x 3 more days after d/c -follow up [facility] 1-2 weeks after discharge  Transaminasemia -hep B surface antigen--neg -hep C antibody--neg -HIV--neg -trending down after stent -am CMP  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 -Continue IV fluids -some improvement with IVF -Na 130 on day of d/c  Hypokalemia -Repleted -Check mag--1.9  COPD/tobacco abuse -tobacco cessation discussed -continue LABA  Severe Malnutrition -continue supplements   Discharge Instructions  Discharge Instructions    Diet - low sodium heart healthy    Complete by:  As directed    Increase activity slowly    Complete by:  As directed      Allergies as of 07/11/2016   No Known Allergies     Medication List    TAKE these medications   albuterol 108 (90 Base) MCG/ACT inhaler Commonly known as:  PROVENTIL HFA Inhale 2 puffs into the lungs every 4 (four) hours as needed for wheezing or shortness of breath. What changed:  Another medication with the same name was removed. Continue taking this medication, and follow the directions you see here.   amoxicillin-clavulanate 875-125 MG tablet Commonly known as:  AUGMENTIN Take 1 tablet by mouth every 12 (twelve) hours.   budesonide-formoterol 160-4.5 MCG/ACT inhaler Commonly known as:  SYMBICORT Inhale 2 puffs into the lungs 2 (two) times daily.     feeding supplement (ENSURE ENLIVE)  Liqd Take 237 mLs by mouth 3 (three) times daily between meals.   gabapentin 300 MG capsule Commonly known as:  NEURONTIN Take 1 capsule (300 mg total) by mouth at bedtime.   naproxen 500 MG tablet Commonly known as:  NAPROSYN Take 1 tablet (500 mg total) by mouth 2 (two) times daily as needed.      Follow-up Information    HAYES,JOHN C, MD Follow up in 1 week(s).   Specialty:  Gastroenterology Contact information: [address] [place] [address] [phone]        [name], MD Follow up in 2 week(s).   Specialty:  General Surgery Contact information: [place] [address] [phone]          No Known Allergies  Consultations:  [facility]  General Surgery  Med Onc--[facility]never   Procedures/Studies: Ct Chest W Contrast  Result Date: 07/09/2016 CLINICAL DATA:  Jaundice.  Pancreatic mass. EXAM: CT CHEST WITH CONTRAST TECHNIQUE: Multidetector CT imaging of the chest was performed during intravenous contrast administration. CONTRAST:  2mL ISOVUE-300 IOPAMIDOL (ISOVUE-300) INJECTION 61% COMPARISON:  MRI abdomen 07/08/2016 FINDINGS: Cardiovascular: No significant vascular findings. Normal heart size. No pericardial effusion. Mediastinum/Nodes: No axillary or supraclavicular adenopathy. No mediastinal hilar adenopathy. There is fluid filled esophagus. No high-grade obstruction evident. Some thickening through the GE junction (image 135 series 2) Lungs/Pleura: Centrilobular emphysema the upper lobes. No suspicious pulmonary nodules. Mild atelectasis in the medial LEFT lung base. Upper Abdomen: Relief of the biliary obstruction. Pancreas not imaged. Musculoskeletal: No aggressive osseous lesion. IMPRESSION: 1. No evidence of pulmonary metastasis. 2. No mediastinal lymphadenopathy. 3. Thickening through the GE junction with fluid-filled esophagus could represent esophagitis at GE junction.  Recommend clinical correlation recent endoscopy. Electronically Signed   By: Suzy Bouchard M.D.   On: 07/09/2016 16:47   Mr 3d Recon At Scanner  Result Date: 07/08/2016 CLINICAL DATA:  Inpatient admitted with jaundice. Biliary ductal dilatation on recent sonogram. Recent abdominal pain. History of tobacco abuse and COPD. EXAM: MRI ABDOMEN WITHOUT CONTRAST  (INCLUDING MRCP) TECHNIQUE: Multiplanar multisequence MR imaging of the abdomen was performed. Heavily T2-weighted images of the biliary and pancreatic ducts were obtained, and three-dimensional MRCP images were rendered by post processing. COMPARISON:  07/07/2016 right upper quadrant abdominal sonogram. FINDINGS: Lower chest: Grossly clear lung bases. Hepatobiliary: Normal liver size and configuration. No hepatic steatosis. No liver mass. Marked diffuse intrahepatic biliary ductal dilatation. Dilated common bile duct measuring 15 mm diameter proximally. Abrupt common bile duct stricture at the level of the pancreatic head with shelf-like margins. No choledocholithiasis. Distended gallbladder. No definite gallbladder wall thickening. No pericholecystic fluid. No cholelithiasis. Pancreas: There is a hypoenhancing 1.9 x 1.6 cm posterior pancreatic head mass (series 1103/ image 44), which abuts posteriorly the site of the common bile duct stricture. Normal caliber main pancreatic duct, noting the presence of pancreas divisum with drainage of the main pancreatic duct via an accessory duct of [place] with no definite communication of the main pancreatic duct with the common bile duct. No additional pancreatic masses. Spleen: Normal size. No mass. Adrenals/Urinary Tract: Normal adrenals. No hydronephrosis. Normal kidneys with no renal mass. Stomach/Bowel: Grossly normal stomach. Visualized small and large bowel is normal caliber, with no bowel wall thickening. Vascular/Lymphatic: Normal caliber abdominal aorta. Patent renal, splenic and hepatic veins. The  pancreatic head mass may minimally abut the posterior wall of the proximal main portal vein, involving less than 30% of the main portal vein  circumference (series 1103/ image 41), with no evidence of portal venous narrowing. Main, right and left portal veins are patent and normal caliber. Superior mesenteric vein, superior mesenteric artery and celiac axis are patent and uninvolved by the mass. No pathologically enlarged lymph nodes in the abdomen. Other: No abdominal ascites or focal fluid collection. Musculoskeletal: No aggressive appearing focal osseous lesions. IMPRESSION: 1. Hypoenhancing 1.9 cm posterior pancreatic head mass, suggestive of primary pancreatic adenocarcinoma. Minimal potential main portal vein involvement by the mass as detailed. GI consultation advised for consideration of ERCP and endoscopic ultrasound for tissue sampling. 2. Marked diffuse intrahepatic biliary ductal dilatation. Gallbladder distention. Common bile duct dilation (15 mm diameter proximally). Malignant appearing common bile duct stricture at the level of the pancreatic head. 3. No pancreatic duct dilation, noting the presence of pancreas divisum. 4. No evidence of metastatic disease in the abdomen. Electronically Signed   By: Ilona Sorrel M.D.   On: 07/08/2016 17:30   Dg Ercp Biliary & Pancreatic Ducts  Result Date: 07/09/2016 CLINICAL DATA:  56 year old male with a history of ERCP and stent placement EXAM: INTRAOPERATIVE CHOLANGIOGRAM TECHNIQUE: Cholangiographic images from the C-arm fluoroscopic device were submitted for interpretation post-operatively. Please see the procedural report for the amount of contrast and the fluoroscopy time utilized. 1 minutes 47 seconds COMPARISON:  MR 07/08/2016 FINDINGS: Limited intraoperative fluoroscopic spot images during ERCP. Initial image demonstrates endoscope projecting over the upper abdomen with cannulation of the ampulla. Images demonstrate partial opacification of the biliary  ducts, with safety wire in place. IMPRESSION: Limited images during ERCP/stenting demonstrating partial opacification of the intrahepatic and extrahepatic biliary system Please refer to the dictated operative report for full details of intraoperative findings and procedure. Electronically Signed   By: Corrie Mckusick D.O.   On: 07/09/2016 12:38   Mr Abdomen With Mrcp W Contrast  Result Date: 07/08/2016 CLINICAL DATA:  Inpatient admitted with jaundice. Biliary ductal dilatation on recent sonogram. Recent abdominal pain. History of tobacco abuse and COPD. EXAM: MRI ABDOMEN WITHOUT CONTRAST  (INCLUDING MRCP) TECHNIQUE: Multiplanar multisequence MR imaging of the abdomen was performed. Heavily T2-weighted images of the biliary and pancreatic ducts were obtained, and three-dimensional MRCP images were rendered by post processing. COMPARISON:  07/07/2016 right upper quadrant abdominal sonogram. FINDINGS: Lower chest: Grossly clear lung bases. Hepatobiliary: Normal liver size and configuration. No hepatic steatosis. No liver mass. Marked diffuse intrahepatic biliary ductal dilatation. Dilated common bile duct measuring 15 mm diameter proximally. Abrupt common bile duct stricture at the level of the pancreatic head with shelf-like margins. No choledocholithiasis. Distended gallbladder. No definite gallbladder wall thickening. No pericholecystic fluid. No cholelithiasis. Pancreas: There is a hypoenhancing 1.9 x 1.6 cm posterior pancreatic head mass (series 1103/ image 44), which abuts posteriorly the site of the common bile duct stricture. Normal caliber main pancreatic duct, noting the presence of pancreas divisum with drainage of the main pancreatic duct via an accessory duct of [place] with no definite communication of the main pancreatic duct with the common bile duct. No additional pancreatic masses. Spleen: Normal size. No mass. Adrenals/Urinary Tract: Normal adrenals. No hydronephrosis. Normal kidneys with no  renal mass. Stomach/Bowel: Grossly normal stomach. Visualized small and large bowel is normal caliber, with no bowel wall thickening. Vascular/Lymphatic: Normal caliber abdominal aorta. Patent renal, splenic and hepatic veins. The pancreatic head mass may minimally abut the posterior wall of the proximal main portal vein, involving less than 30% of the main portal vein circumference (series 1103/ image 41), with no evidence  of portal venous narrowing. Main, right and left portal veins are patent and normal caliber. Superior mesenteric vein, superior mesenteric artery and celiac axis are patent and uninvolved by the mass. No pathologically enlarged lymph nodes in the abdomen. Other: No abdominal ascites or focal fluid collection. Musculoskeletal: No aggressive appearing focal osseous lesions. IMPRESSION: 1. Hypoenhancing 1.9 cm posterior pancreatic head mass, suggestive of primary pancreatic adenocarcinoma. Minimal potential main portal vein involvement by the mass as detailed. GI consultation advised for consideration of ERCP and endoscopic ultrasound for tissue sampling. 2. Marked diffuse intrahepatic biliary ductal dilatation. Gallbladder distention. Common bile duct dilation (15 mm diameter proximally). Malignant appearing common bile duct stricture at the level of the pancreatic head. 3. No pancreatic duct dilation, noting the presence of pancreas divisum. 4. No evidence of metastatic disease in the abdomen. Electronically Signed   By: Ilona Sorrel M.D.   On: 07/08/2016 17:30   US Abdomen Limited Ruq  Result Date: 07/07/2016 CLINICAL DATA:  Jaundice and pain EXAM: US ABDOMEN LIMITED - RIGHT UPPER QUADRANT COMPARISON:  None. FINDINGS: Gallbladder: The gallbladder is distended with borderline wall thickening. No gallstones or pericholecystic fluid evident. No sonographic Murphy sign noted by sonographer. Common bile duct: Diameter: 15 mm common distended. There is generalized intrahepatic biliary duct  dilatation. There is common hepatic and common bile duct dilatation without evident mass or calculus. No obvious pancreatic head lesion seen by ultrasound. Liver: No focal lesion identified. Within normal limits in parenchymal echogenicity. There is intrahepatic biliary duct dilatation. IMPRESSION: Distended gallbladder with slight gallbladder wall thickening. There may be early acalculus cholecystitis. There is generalized intrahepatic and extrahepatic biliary duct dilatation without mass or calculus evident. Etiology uncertain. This finding may warrant MRCP to further assess. No parenchymal liver lesions evident. Electronically Signed   By: Lowella Grip III M.D.   On: 07/07/2016 13:54        Discharge Exam: Vitals:   07/10/16 1949 07/11/16 0556  BP: 114/75 113/89  Pulse: 88 91  Resp: 16 18  Temp: 98.1 F (36.7 C) 98 F (36.7 C)   Vitals:   07/10/16 1949 07/11/16 0556 07/11/16 0807 07/11/16 0810  BP: 114/75 113/89    Pulse: 88 91    Resp: 16 18    Temp: 98.1 F (36.7 C) 98 F (36.7 C)    TempSrc: Oral Oral    SpO2: 98% 97% 93% 93%  Weight:  57.2 kg (126 lb 1.6 oz)    Height:        General: Pt is alert, awake, not in acute distress Cardiovascular: RRR, S1/S2 +, no rubs, no gallops Respiratory: CTA bilaterally, no wheezing, no rhonchi Abdominal: Soft, NT, ND, bowel sounds + Extremities: no edema, no cyanosis   The results of significant diagnostics from this hospitalization (including imaging, microbiology, ancillary and laboratory) are listed below for reference.    Significant Diagnostic Studies: Ct Chest W Contrast  Result Date: 07/09/2016 CLINICAL DATA:  Jaundice.  Pancreatic mass. EXAM: CT CHEST WITH CONTRAST TECHNIQUE: Multidetector CT imaging of the chest was performed during intravenous contrast administration. CONTRAST:  30mL ISOVUE-300 IOPAMIDOL (ISOVUE-300) INJECTION 61% COMPARISON:  MRI abdomen 07/08/2016 FINDINGS: Cardiovascular: No significant vascular  findings. Normal heart size. No pericardial effusion. Mediastinum/Nodes: No axillary or supraclavicular adenopathy. No mediastinal hilar adenopathy. There is fluid filled esophagus. No high-grade obstruction evident. Some thickening through the GE junction (image 135 series 2) Lungs/Pleura: Centrilobular emphysema the upper lobes. No suspicious pulmonary nodules. Mild atelectasis in the medial LEFT lung  base. Upper Abdomen: Relief of the biliary obstruction. Pancreas not imaged. Musculoskeletal: No aggressive osseous lesion. IMPRESSION: 1. No evidence of pulmonary metastasis. 2. No mediastinal lymphadenopathy. 3. Thickening through the GE junction with fluid-filled esophagus could represent esophagitis at GE junction. Recommend clinical correlation recent endoscopy. Electronically Signed   By: Suzy Bouchard M.D.   On: 07/09/2016 16:47   Mr 3d Recon At Scanner  Result Date: 07/08/2016 CLINICAL DATA:  Inpatient admitted with jaundice. Biliary ductal dilatation on recent sonogram. Recent abdominal pain. History of tobacco abuse and COPD. EXAM: MRI ABDOMEN WITHOUT CONTRAST  (INCLUDING MRCP) TECHNIQUE: Multiplanar multisequence MR imaging of the abdomen was performed. Heavily T2-weighted images of the biliary and pancreatic ducts were obtained, and three-dimensional MRCP images were rendered by post processing. COMPARISON:  07/07/2016 right upper quadrant abdominal sonogram. FINDINGS: Lower chest: Grossly clear lung bases. Hepatobiliary: Normal liver size and configuration. No hepatic steatosis. No liver mass. Marked diffuse intrahepatic biliary ductal dilatation. Dilated common bile duct measuring 15 mm diameter proximally. Abrupt common bile duct stricture at the level of the pancreatic head with shelf-like margins. No choledocholithiasis. Distended gallbladder. No definite gallbladder wall thickening. No pericholecystic fluid. No cholelithiasis. Pancreas: There is a hypoenhancing 1.9 x 1.6 cm posterior  pancreatic head mass (series 1103/ image 44), which abuts posteriorly the site of the common bile duct stricture. Normal caliber main pancreatic duct, noting the presence of pancreas divisum with drainage of the main pancreatic duct via an accessory duct of [place] with no definite communication of the main pancreatic duct with the common bile duct. No additional pancreatic masses. Spleen: Normal size. No mass. Adrenals/Urinary Tract: Normal adrenals. No hydronephrosis. Normal kidneys with no renal mass. Stomach/Bowel: Grossly normal stomach. Visualized small and large bowel is normal caliber, with no bowel wall thickening. Vascular/Lymphatic: Normal caliber abdominal aorta. Patent renal, splenic and hepatic veins. The pancreatic head mass may minimally abut the posterior wall of the proximal main portal vein, involving less than 30% of the main portal vein circumference (series 1103/ image 41), with no evidence of portal venous narrowing. Main, right and left portal veins are patent and normal caliber. Superior mesenteric vein, superior mesenteric artery and celiac axis are patent and uninvolved by the mass. No pathologically enlarged lymph nodes in the abdomen. Other: No abdominal ascites or focal fluid collection. Musculoskeletal: No aggressive appearing focal osseous lesions. IMPRESSION: 1. Hypoenhancing 1.9 cm posterior pancreatic head mass, suggestive of primary pancreatic adenocarcinoma. Minimal potential main portal vein involvement by the mass as detailed. GI consultation advised for consideration of ERCP and endoscopic ultrasound for tissue sampling. 2. Marked diffuse intrahepatic biliary ductal dilatation. Gallbladder distention. Common bile duct dilation (15 mm diameter proximally). Malignant appearing common bile duct stricture at the level of the pancreatic head. 3. No pancreatic duct dilation, noting the presence of pancreas divisum. 4. No evidence of metastatic disease in the abdomen.  Electronically Signed   By: Ilona Sorrel M.D.   On: 07/08/2016 17:30   Dg Ercp Biliary & Pancreatic Ducts  Result Date: 07/09/2016 CLINICAL DATA:  56 year old male with a history of ERCP and stent placement EXAM: INTRAOPERATIVE CHOLANGIOGRAM TECHNIQUE: Cholangiographic images from the C-arm fluoroscopic device were submitted for interpretation post-operatively. Please see the procedural report for the amount of contrast and the fluoroscopy time utilized. 1 minutes 47 seconds COMPARISON:  MR 07/08/2016 FINDINGS: Limited intraoperative fluoroscopic spot images during ERCP. Initial image demonstrates endoscope projecting over the upper abdomen with cannulation of the ampulla. Images demonstrate partial opacification of  the biliary ducts, with safety wire in place. IMPRESSION: Limited images during ERCP/stenting demonstrating partial opacification of the intrahepatic and extrahepatic biliary system Please refer to the dictated operative report for full details of intraoperative findings and procedure. Electronically Signed   By: Corrie Mckusick D.O.   On: 07/09/2016 12:38   Mr Abdomen With Mrcp W Contrast  Result Date: 07/08/2016 CLINICAL DATA:  Inpatient admitted with jaundice. Biliary ductal dilatation on recent sonogram. Recent abdominal pain. History of tobacco abuse and COPD. EXAM: MRI ABDOMEN WITHOUT CONTRAST  (INCLUDING MRCP) TECHNIQUE: Multiplanar multisequence MR imaging of the abdomen was performed. Heavily T2-weighted images of the biliary and pancreatic ducts were obtained, and three-dimensional MRCP images were rendered by post processing. COMPARISON:  07/07/2016 right upper quadrant abdominal sonogram. FINDINGS: Lower chest: Grossly clear lung bases. Hepatobiliary: Normal liver size and configuration. No hepatic steatosis. No liver mass. Marked diffuse intrahepatic biliary ductal dilatation. Dilated common bile duct measuring 15 mm diameter proximally. Abrupt common bile duct stricture at the level  of the pancreatic head with shelf-like margins. No choledocholithiasis. Distended gallbladder. No definite gallbladder wall thickening. No pericholecystic fluid. No cholelithiasis. Pancreas: There is a hypoenhancing 1.9 x 1.6 cm posterior pancreatic head mass (series 1103/ image 44), which abuts posteriorly the site of the common bile duct stricture. Normal caliber main pancreatic duct, noting the presence of pancreas divisum with drainage of the main pancreatic duct via an accessory duct of [place] with no definite communication of the main pancreatic duct with the common bile duct. No additional pancreatic masses. Spleen: Normal size. No mass. Adrenals/Urinary Tract: Normal adrenals. No hydronephrosis. Normal kidneys with no renal mass. Stomach/Bowel: Grossly normal stomach. Visualized small and large bowel is normal caliber, with no bowel wall thickening. Vascular/Lymphatic: Normal caliber abdominal aorta. Patent renal, splenic and hepatic veins. The pancreatic head mass may minimally abut the posterior wall of the proximal main portal vein, involving less than 30% of the main portal vein circumference (series 1103/ image 41), with no evidence of portal venous narrowing. Main, right and left portal veins are patent and normal caliber. Superior mesenteric vein, superior mesenteric artery and celiac axis are patent and uninvolved by the mass. No pathologically enlarged lymph nodes in the abdomen. Other: No abdominal ascites or focal fluid collection. Musculoskeletal: No aggressive appearing focal osseous lesions. IMPRESSION: 1. Hypoenhancing 1.9 cm posterior pancreatic head mass, suggestive of primary pancreatic adenocarcinoma. Minimal potential main portal vein involvement by the mass as detailed. GI consultation advised for consideration of ERCP and endoscopic ultrasound for tissue sampling. 2. Marked diffuse intrahepatic biliary ductal dilatation. Gallbladder distention. Common bile duct dilation (15 mm  diameter proximally). Malignant appearing common bile duct stricture at the level of the pancreatic head. 3. No pancreatic duct dilation, noting the presence of pancreas divisum. 4. No evidence of metastatic disease in the abdomen. Electronically Signed   By: Ilona Sorrel M.D.   On: 07/08/2016 17:30   US Abdomen Limited Ruq  Result Date: 07/07/2016 CLINICAL DATA:  Jaundice and pain EXAM: US ABDOMEN LIMITED - RIGHT UPPER QUADRANT COMPARISON:  None. FINDINGS: Gallbladder: The gallbladder is distended with borderline wall thickening. No gallstones or pericholecystic fluid evident. No sonographic Murphy sign noted by sonographer. Common bile duct: Diameter: 15 mm common distended. There is generalized intrahepatic biliary duct dilatation. There is common hepatic and common bile duct dilatation without evident mass or calculus. No obvious pancreatic head lesion seen by ultrasound. Liver: No focal lesion identified. Within normal limits in parenchymal echogenicity. There  is intrahepatic biliary duct dilatation. IMPRESSION: Distended gallbladder with slight gallbladder wall thickening. There may be early acalculus cholecystitis. There is generalized intrahepatic and extrahepatic biliary duct dilatation without mass or calculus evident. Etiology uncertain. This finding may warrant MRCP to further assess. No parenchymal liver lesions evident. Electronically Signed   By: Lowella Grip III M.D.   On: 07/07/2016 13:54     Microbiology: No results found for this or any previous visit (from the past 240 hour(s)).   Labs: Basic Metabolic Panel:  Recent Labs Lab 07/07/16 1933 07/08/16 0425 07/08/16 1220 07/09/16 0414 07/10/16 0434 07/11/16 0955  NA 128* 130*  --  129* 130* 130*  K 3.4* 3.0*  --  3.6 4.6 4.2  CL 89* 91*  --  94* 91* 92*  CO2 31 30  --  28 29 31   GLUCOSE 92 100*  --  97 182* 120*  BUN 8 8  --  6 17 17   CREATININE <0.30* <0.30*  --  <0.30* 0.54* 0.50*  CALCIUM 9.0 8.8*  --  8.8* 9.1  9.0  MG  --   --  1.9  --   --   --    Liver Function Tests:  Recent Labs Lab 07/07/16 1211 07/08/16 0425 07/09/16 0414 07/10/16 0434 07/11/16 0955  AST 198* 231* 197* 131* 73*  ALT 274* 274* 258* 244* 173*  ALKPHOS 779* 711* 670* 597* 450*  BILITOT 35.4* 31.2* 28.5* 15.1* 9.9*  PROT 6.3* 5.7* 5.4* 5.9* 5.9*  ALBUMIN 2.8* 2.6* 2.2* 2.4* 2.5*    Recent Labs Lab 07/08/16 1220  LIPASE 43   No results for input(s): AMMONIA in the last 168 hours. CBC:  Recent Labs Lab 07/07/16 1211 07/08/16 0425 07/09/16 0414  WBC 13.4* 10.2 12.0*  HGB 13.8 11.8* 11.2*  HCT 40.1 32.8* 31.5*  MCV 95.0 89.6 88.5  PLT 408 447* 422*   Cardiac Enzymes: No results for input(s): CKTOTAL, CKMB, CKMBINDEX, TROPONINI in the last 168 hours. BNP: Invalid input(s): POCBNP CBG:  Recent Labs Lab 07/08/16 0752 07/09/16 0807 07/10/16 0725 07/11/16 0750  GLUCAP 95 108* 164* 99    Time coordinating discharge:  Greater than 30 minutes  Signed:  Emerick Weatherly, DO [facility] Pager: [phone] 07/11/2016, 1:53 PM

## 2016-07-11 NOTE — Progress Notes (Addendum)
CSW attempted to call pt's family friend Tressie Ellis to provide pt with a ride, but pt's contacts "cannot do that".  Alphonse Guild. Hollyanne Schloesser, Latanya Presser, LCAS Clinical Social Worker Ph: [phone]

## 2016-07-11 NOTE — Progress Notes (Signed)
Nursing Discharge Summary  Patient ID: Geovonni Meyerhoff. MRN: [id number] DOB/AGE: [birth year] 56 y.o.  Admit date: 07/07/2016 Discharge date: 07/11/2016  Discharged Condition: good  Disposition: 01-Home or Self Care  Follow-up Information    HAYES,JOHN C, MD Follow up in 1 week(s).   Specialty:  Gastroenterology Contact information: [address] [address] [phone]        [name], MD Follow up in 2 week(s).   Specialty:  General Surgery Contact information: [address] [place] [address] [phone]           Prescriptions Given: Augmentin called in and Ensure prescription given.  Patient follow up appointments and medications discussed. Patient verbalized understanding without further questions.   Means of Discharge: Patient to be discharged home via private vehicle  Signed: Buel Ream 07/11/2016, 3:11 PM

## 2016-07-11 NOTE — Progress Notes (Signed)
Subjective: No complaints.  Objective: Vital signs in last 24 hours: Temp:  [98 F (36.7 C)-98.1 F (36.7 C)] 98 F (36.7 C) (04/15 0556) Pulse Rate:  [88-91] 91 (04/15 0556) Resp:  [16-18] 18 (04/15 0556) BP: (113-114)/(75-89) 113/89 (04/15 0556) SpO2:  [93 %-98 %] 93 % (04/15 0810) Weight:  [57.2 kg (126 lb 1.6 oz)] 57.2 kg (126 lb 1.6 oz) (04/15 0556) Weight change: 1.199 kg (2 lb 10.3 oz) Last BM Date: 07/10/16  PE: GEN:  Thin, cachectic-appearing SKIN:  Jaundiced HEENT:  Scleral icterus  Lab Results: CBC    Component Value Date/Time   WBC 12.0 (H) 07/09/2016 0414   RBC 3.56 (L) 07/09/2016 0414   HGB 11.2 (L) 07/09/2016 0414   HCT 31.5 (L) 07/09/2016 0414   PLT 422 (H) 07/09/2016 0414   MCV 88.5 07/09/2016 0414   MCH 31.5 07/09/2016 0414   MCHC 35.6 07/09/2016 0414   RDW 17.6 (H) 07/09/2016 0414   CMP     Component Value Date/Time   NA 130 (L) 07/11/2016 0955   NA 139 01/23/2014   K 4.2 07/11/2016 0955   CL 92 (L) 07/11/2016 0955   CO2 31 07/11/2016 0955   GLUCOSE 120 (H) 07/11/2016 0955   BUN 17 07/11/2016 0955   BUN 5 01/23/2014   CREATININE 0.50 (L) 07/11/2016 0955   CALCIUM 9.0 07/11/2016 0955   PROT 5.9 (L) 07/11/2016 0955   ALBUMIN 2.5 (L) 07/11/2016 0955   AST 73 (H) 07/11/2016 0955   ALT 173 (H) 07/11/2016 0955   ALKPHOS 450 (H) 07/11/2016 0955   BILITOT 9.9 (H) 07/11/2016 0955   GFRNONAA >60 07/11/2016 0955   GFRAA >60 07/11/2016 0955   Assessment:  1.  Obstructive jaundice, improving after ERCP with biliary stent placement. 2.  Pancreatic mass, worrisome for adenocarcinoma. 3.  Weight loss. 4.  Distal bile duct stricture, brushed for cytology. 5.  Elevated LFTs, improving after biliary stent placement.  Plan:  1.  Awaiting stricture brushing cytology results. 2.  OK to discharge home today, from GI perspective. 3.  [facility] will follow-up with patient as outpatient, and coordinate any more studies that might be needed. 4.  Will  sign-off; please call with questions; thank you for the consultation.   CLAYDEN, WITHEM 07/11/2016, 3:01 PM   Pager [phone] If no answer or after 5 PM call [phone]

## 2016-07-12 NOTE — Anesthesia Postprocedure Evaluation (Signed)
Anesthesia Post Note  Patient: Jordie Schreur.  Procedure(s) Performed: Procedure(s) (LRB): ENDOSCOPIC RETROGRADE CHOLANGIOPANCREATOGRAPHY (ERCP) (N/A)  Patient location during evaluation: Endoscopy Anesthesia Type: General Level of consciousness: awake and alert Pain management: pain level controlled Vital Signs Assessment: post-procedure vital signs reviewed and stable Respiratory status: spontaneous breathing, nonlabored ventilation, respiratory function stable and patient connected to nasal cannula oxygen Cardiovascular status: blood pressure returned to baseline and stable Postop Assessment: no signs of nausea or vomiting Anesthetic complications: no       Last Vitals:  Vitals:   07/10/16 1949 07/11/16 0556  BP: 114/75 113/89  Pulse: 88 91  Resp: 16 18  Temp: 36.7 C 36.7 C    Last Pain:  Vitals:   07/11/16 0945  TempSrc:   PainSc: 0-No pain                 Waylan Busta,JAMES TERRILL

## 2016-07-19 ENCOUNTER — Other Ambulatory Visit: Payer: Self-pay | Admitting: General Surgery

## 2016-07-28 ENCOUNTER — Telehealth: Payer: Self-pay | Admitting: Hematology & Oncology

## 2016-07-28 NOTE — Telephone Encounter (Signed)
Patient returned call and is scheduled to see [facility] 5/8 @ 9 am prior to his pre admission testing at [facility]. Message to [facility] re date/time.

## 2016-07-28 NOTE — Telephone Encounter (Signed)
PE patient. Reached out to patient due to staff message from nutritionist re patient having nutrition appointment. Not able to reach patient and left message asking that he call me re setting up a nutrition appointment at the request of PE.

## 2016-07-30 NOTE — Progress Notes (Signed)
Called blood bank to let them know pt. Is coming for surgery on 08/11/16 and will be cross matched for 4 units prbcs

## 2016-07-30 NOTE — Patient Instructions (Signed)
Benjamin Diaz.  07/30/2016   Your procedure is scheduled on: 08/11/16  Report to [facility] Main  Entrance Take [place]  elevators to 3rd floor to  [place] at    0700 AM.    Call this number if you have problems the morning of surgery [phone]    Remember: ONLY 1 PERSON MAY GO WITH YOU TO SHORT STAY TO GET  READY MORNING OF YOUR SURGERY.  Do not eat food or drink liquids :After Midnight.     Take these medicines the morning of surgery with A SIP OF WATER: inhaler and bring                                You may not have any metal on your body including hair pins and              piercings  Do not wear jewelry lotions, powders or perfumes, deodorant                          Men may shave face and neck.   Do not bring valuables to the hospital. [place].  Contacts, dentures or bridgework may not be worn into surgery.  Leave suitcase in the car. After surgery it may be brought to your room.                 Please read over the following fact sheets you were given: _____________________________________________________________________            [facility] - Preparing for Surgery Before surgery, you can play an important role.  Because skin is not sterile, your skin needs to be as free of germs as possible.  You can reduce the number of germs on your skin by washing with CHG (chlorahexidine gluconate) soap before surgery.  CHG is an antiseptic cleaner which kills germs and bonds with the skin to continue killing germs even after washing. Please DO NOT use if you have an allergy to CHG or antibacterial soaps.  If your skin becomes reddened/irritated stop using the CHG and inform your nurse when you arrive at Short Stay. Do not shave (including legs and underarms) for at least 48 hours prior to the first CHG shower.  You may shave your face/neck. Please follow these instructions carefully:  1.   Shower with CHG Soap the night before surgery and the  morning of Surgery.  2.  If you choose to wash your hair, wash your hair first as usual with your  normal  shampoo.  3.  After you shampoo, rinse your hair and body thoroughly to remove the  shampoo.                           4.  Use CHG as you would any other liquid soap.  You can apply chg directly  to the skin and wash                       Gently with a scrungie or clean washcloth.  5.  Apply the CHG Soap to your body ONLY FROM THE NECK DOWN.  Do not use on face/ open                           Wound or open sores. Avoid contact with eyes, ears mouth and genitals (private parts).                       Wash face,  Genitals (private parts) with your normal soap.             6.  Wash thoroughly, paying special attention to the area where your surgery  will be performed.  7.  Thoroughly rinse your body with warm water from the neck down.  8.  DO NOT shower/wash with your normal soap after using and rinsing off  the CHG Soap.                9.  Pat yourself dry with a clean towel.            10.  Wear clean pajamas.            11.  Place clean sheets on your bed the night of your first shower and do not  sleep with pets. Day of Surgery : Do not apply any lotions/deodorants the morning of surgery.  Please wear clean clothes to the hospital/surgery center.  FAILURE TO FOLLOW THESE INSTRUCTIONS MAY RESULT IN THE CANCELLATION OF YOUR SURGERY PATIENT SIGNATURE_________________________________  NURSE SIGNATURE__________________________________  ________________________________________________________________________  WHAT IS A BLOOD TRANSFUSION? Blood Transfusion Information  A transfusion is the replacement of blood or some of its parts. Blood is made up of multiple cells which provide different functions.  Red blood cells carry oxygen and are used for blood loss replacement.  White blood cells fight against infection.  Platelets control  bleeding.  Plasma helps clot blood.  Other blood products are available for specialized needs, such as hemophilia or other clotting disorders. BEFORE THE TRANSFUSION  Who gives blood for transfusions?   Healthy volunteers who are fully evaluated to make sure their blood is safe. This is blood bank blood. Transfusion therapy is the safest it has ever been in the practice of medicine. Before blood is taken from a donor, a complete history is taken to make sure that person has no history of diseases nor engages in risky social behavior (examples are intravenous drug use or sexual activity with multiple partners). The donor's travel history is screened to minimize risk of transmitting infections, such as malaria. The donated blood is tested for signs of infectious diseases, such as HIV and hepatitis. The blood is then tested to be sure it is compatible with you in order to minimize the chance of a transfusion reaction. If you or a relative donates blood, this is often done in anticipation of surgery and is not appropriate for emergency situations. It takes many days to process the donated blood. RISKS AND COMPLICATIONS Although transfusion therapy is very safe and saves many lives, the main dangers of transfusion include:   Getting an infectious disease.  Developing a transfusion reaction. This is an allergic reaction to something in the blood you were given. Every precaution is taken to prevent this. The decision to have a blood transfusion has been considered carefully by your caregiver before blood is given. Blood is not given unless the benefits outweigh the risks. AFTER THE TRANSFUSION  Right after receiving a blood transfusion, you will usually feel much better and more energetic. This is especially  true if your red blood cells have gotten low (anemic). The transfusion raises the level of the red blood cells which carry oxygen, and this usually causes an energy increase.  The nurse  administering the transfusion will monitor you carefully for complications. HOME CARE INSTRUCTIONS  No special instructions are needed after a transfusion. You may find your energy is better. Speak with your caregiver about any limitations on activity for underlying diseases you may have. SEEK MEDICAL CARE IF:   Your condition is not improving after your transfusion.  You develop redness or irritation at the intravenous (IV) site. SEEK IMMEDIATE MEDICAL CARE IF:  Any of the following symptoms occur over the next 12 hours:  Shaking chills.  You have a temperature by mouth above 102 F (38.9 C), not controlled by medicine.  Chest, back, or muscle pain.  People around you feel you are not acting correctly or are confused.  Shortness of breath or difficulty breathing.  Dizziness and fainting.  You get a rash or develop hives.  You have a decrease in urine output.  Your urine turns a dark color or changes to pink, red, or brown. Any of the following symptoms occur over the next 10 days:  You have a temperature by mouth above 102 F (38.9 C), not controlled by medicine.  Shortness of breath.  Weakness after normal activity.  The white part of the eye turns yellow (jaundice).  You have a decrease in the amount of urine or are urinating less often.  Your urine turns a dark color or changes to pink, red, or brown. Document Released: 03/12/2000 Document Revised: 06/07/2011 Document Reviewed: 10/30/2007 [facility] [place] Patient Information 2014 [place], [place].  _______________________________________________________________________

## 2016-08-03 ENCOUNTER — Encounter (HOSPITAL_COMMUNITY): Payer: Self-pay

## 2016-08-03 ENCOUNTER — Encounter (HOSPITAL_COMMUNITY)
Admission: RE | Admit: 2016-08-03 | Discharge: 2016-08-03 | Disposition: A | Discharge status: Home / Self Care | Payer: BLUE CROSS/BLUE SHIELD | Source: Ambulatory Visit | Attending: General Surgery | Admitting: General Surgery

## 2016-08-03 ENCOUNTER — Ambulatory Visit: Payer: BLUE CROSS/BLUE SHIELD

## 2016-08-03 DIAGNOSIS — Z0183 Encounter for blood typing: Secondary | ICD-10-CM | POA: Insufficient documentation

## 2016-08-03 DIAGNOSIS — R9431 Abnormal electrocardiogram [ECG] [EKG]: Secondary | ICD-10-CM | POA: Diagnosis not present

## 2016-08-03 DIAGNOSIS — Z01812 Encounter for preprocedural laboratory examination: Secondary | ICD-10-CM | POA: Insufficient documentation

## 2016-08-03 DIAGNOSIS — K869 Disease of pancreas, unspecified: Secondary | ICD-10-CM | POA: Diagnosis not present

## 2016-08-03 DIAGNOSIS — Z01818 Encounter for other preprocedural examination: Secondary | ICD-10-CM | POA: Insufficient documentation

## 2016-08-03 HISTORY — DX: Cardiac arrhythmia, unspecified: I49.9

## 2016-08-03 LAB — COMPREHENSIVE METABOLIC PANEL
ALK PHOS: 1012 U/L — AB (ref 38–126)
ALT: 218 U/L — ABNORMAL HIGH (ref 17–63)
AST: 149 U/L — AB (ref 15–41)
Albumin: 2.4 g/dL — ABNORMAL LOW (ref 3.5–5.0)
Anion gap: 12 (ref 5–15)
BUN: 6 mg/dL (ref 6–20)
CALCIUM: 9.1 mg/dL (ref 8.9–10.3)
CO2: 29 mmol/L (ref 22–32)
Chloride: 85 mmol/L — ABNORMAL LOW (ref 101–111)
Glucose, Bld: 113 mg/dL — ABNORMAL HIGH (ref 65–99)
Potassium: 3.5 mmol/L (ref 3.5–5.1)
Sodium: 126 mmol/L — ABNORMAL LOW (ref 135–145)
Total Bilirubin: 18.8 mg/dL (ref 0.3–1.2)
Total Protein: 6.8 g/dL (ref 6.5–8.1)

## 2016-08-03 LAB — URINALYSIS, ROUTINE W REFLEX MICROSCOPIC
GLUCOSE, UA: NEGATIVE mg/dL
Hgb urine dipstick: NEGATIVE
KETONES UR: NEGATIVE mg/dL
LEUKOCYTES UA: NEGATIVE
NITRITE: NEGATIVE
PH: 7 (ref 5.0–8.0)
PROTEIN: NEGATIVE mg/dL
Specific Gravity, Urine: 1.009 (ref 1.005–1.030)

## 2016-08-03 LAB — BLOOD GAS, ARTERIAL
ACID-BASE EXCESS: 5.7 mmol/L — AB (ref 0.0–2.0)
Bicarbonate: 29.6 mmol/L — ABNORMAL HIGH (ref 20.0–28.0)
DRAWN BY: 103701
FIO2: 21
O2 Saturation: 91.8 %
PCO2 ART: 41.3 mmHg (ref 32.0–48.0)
PH ART: 7.468 — AB (ref 7.350–7.450)
Patient temperature: 98.6
pO2, Arterial: 59.9 mmHg — ABNORMAL LOW (ref 83.0–108.0)

## 2016-08-03 LAB — CBC WITH DIFFERENTIAL/PLATELET
BASOS PCT: 1 %
Basophils Absolute: 0.2 10*3/uL — ABNORMAL HIGH (ref 0.0–0.1)
EOS PCT: 2 %
Eosinophils Absolute: 0.3 10*3/uL (ref 0.0–0.7)
HEMATOCRIT: 28.3 % — AB (ref 39.0–52.0)
HEMOGLOBIN: 10 g/dL — AB (ref 13.0–17.0)
LYMPHS PCT: 16 %
Lymphs Abs: 2.7 10*3/uL (ref 0.7–4.0)
MCH: 33.6 pg (ref 26.0–34.0)
MCHC: 35.3 g/dL (ref 30.0–36.0)
MCV: 95 fL (ref 78.0–100.0)
MONOS PCT: 1 %
Monocytes Absolute: 0.2 10*3/uL (ref 0.1–1.0)
NEUTROS PCT: 80 %
Neutro Abs: 13.2 10*3/uL — ABNORMAL HIGH (ref 1.7–7.7)
Platelets: 525 10*3/uL — ABNORMAL HIGH (ref 150–400)
RBC: 2.98 MIL/uL — ABNORMAL LOW (ref 4.22–5.81)
RDW: 13.7 % (ref 11.5–15.5)
WBC: 16.6 10*3/uL — ABNORMAL HIGH (ref 4.0–10.5)

## 2016-08-03 LAB — ABO/RH: ABO/RH(D): B POS

## 2016-08-03 LAB — PROTIME-INR
INR: 1.14
PROTHROMBIN TIME: 14.6 s (ref 11.4–15.2)

## 2016-08-03 NOTE — Progress Notes (Signed)
Spoke with Jackson Latino, PA with [facility] with critical total bilirubin result of 18.8 will routed also to Dr. Tomasita Crumble via epic CMP and CBC and UA

## 2016-08-03 NOTE — Progress Notes (Signed)
CRITICAL VALUE ALERT  Critical value received:  Total bilirubin   Date of notification:  08/03/16  Time of notification:  12:20  Critical value read back:yes  Nurse who received alert:  Leo Grosser  MD notified (1st page):  Wilma Flavin  Time of first page:  12:24pm  MD notified (2nd page):  Time of second page:  Responding MD:  Jackson Latino, PA  Time MD responded:  12:30

## 2016-08-03 NOTE — Progress Notes (Signed)
Nutrition Assessment   Reason for Assessment:   Referral from Dr. Barry Dienes to improve nutrition prior to surgery.  ASSESSMENT:  56 year old male with pancreatic head mass.  Noted hospital admission with obstructive jaundice, pancreatic head mass, biliary stricture with stent placement.  Past medical history of COPD, tobacco use, Etoh use, afib.  Noted planning whipple procedure on 5/16.  Noted oncology met with patient in hospital and no plans for chemotherapy at this time.   Patient reports normal appetite except for the last week has not been able to tolerate much meat.  When eats meat vomits shortly after eating it. Has been eating more broth based noodle soups recently.  Reports typically eats honeybun for breakfast and TV dinner for evening meal.  Does not eat much during the day. Reports works 2 jobs although will only be working 1 job as of this Friday.  Reports patient is on his feet walking during the day and very active.  Does not do heavy lifting.    Patient reports he has been drinking 2 ensure enlive per day.  Likes boost as well.  Patient reports usually has 2-3 bowel movements per day, normal consistency, no diarrhea.  Reports this is normal for him.    Nutrition Focused Physical Exam: Nutrition-Focused physical exam completed. Findings are severe fat depletion in ribs and upper arm, under eyes, severe muscle depletion in thigh, knee,clavicle, temple other areas not observed due to patient wearing clothes, and no edema.   Medications: reviewed  Labs: Na 130 (4/15), glucose 120, creatinine 0.50, ALT 173, AST 73  Anthropometrics:   Height: 72 inches Weight: 126 lb 1.6 oz on 4/15 UBW: 126 lb years ago per patient. Reports he has never weighed above 126 lb.  Reports he has been 117 lb in the last few years.  BMI: 17  Noted weight of 125 lb on 03/2016.  6% weight loss in the last 4 months, not significant  Estimated Energy Needs  Kcals: 1700-2000 calories/d Protein: 68-86  g/d Fluid: 2  NUTRITION DIAGNOSIS: Underweight related to likely pancreatic mass, ?? etoh use as evidenced by BMI 17, severe loss of fat and muscle mass.     MALNUTRITION DIAGNOSIS: continue to assess    INTERVENTION:   Discussed importance of good nutrition leading up to surgery on 5/16.  Discussed ways to increase calories and protein in diet. Fact sheet provided. Discussed good sources of protein and provided examples Encouraged small frequent meals Recommended patient increase ensure enlive to at least 3 times per day. Boost plus can be used in addition to ensure enlive.  Coupons given. Will contact inpatient RD regarding upcoming hospital admission to follow/educate patient following surgery.      MONITORING, EVALUATION, GOAL: Patient will increase calories and protein to maximize nutrition prior to surgery   NEXT VISIT: as needed  Althea Backs B. Zenia Resides, [place], [place] Registered Dietitian [phone] (pager)

## 2016-08-03 NOTE — Progress Notes (Signed)
CBC,CMP,UA,ABG done 08/03/16 at PAT appt. At [facility] routed to Dr. Laroy Apple and Jackson Latino, [place] via epic.

## 2016-08-03 NOTE — Progress Notes (Signed)
CT chest 07/09/16 epic

## 2016-08-03 NOTE — Progress Notes (Signed)
Spoke with Benjamin Diaz at [place] [phone]  To report a critical lab at 12:24 pm  No Dr. Available to talk she will page a PA to call me at [phone]

## 2016-08-04 LAB — HEMOGLOBIN A1C

## 2016-08-05 ENCOUNTER — Telehealth: Payer: Self-pay | Admitting: General Surgery

## 2016-08-05 LAB — PREPARE RBC (CROSSMATCH)

## 2016-08-05 NOTE — Progress Notes (Signed)
Per Rip Harbour in blood bank she stated to do type and screen and crossmatch on 08/11/16 on day of surgery for patient.   After unable to enter order for crossmatch called Blood Bank again and spoke with Nunzio Cory who states there is a type and screen availble from 08/03/16 Nunzio Cory stated to either or 08/10/16 or 08/11/16 go in and add crossmatch for 4 units which was previously ordered by Dr Barry Dienes on the original crossmatch order.

## 2016-08-05 NOTE — Telephone Encounter (Signed)
Left message on machine.  Need to cancel surgery next week due to high bilirubin, low sodium and chloride, and low albumin. Office will try to contact again to see if he can come to [place] office this afternoon.

## 2016-08-11 ENCOUNTER — Other Ambulatory Visit: Payer: Self-pay | Admitting: Gastroenterology

## 2016-08-15 LAB — TYPE AND SCREEN
ABO/RH(D): B POS
Antibody Screen: NEGATIVE
UNIT DIVISION: 0
UNIT DIVISION: 0
Unit division: 0
Unit division: 0

## 2016-08-15 LAB — BPAM RBC
BLOOD PRODUCT EXPIRATION DATE: 201806132359
BLOOD PRODUCT EXPIRATION DATE: 201806182359
Blood Product Expiration Date: 201806142359
Blood Product Expiration Date: 201806182359
UNIT TYPE AND RH: 7300
UNIT TYPE AND RH: 7300
Unit Type and Rh: 7300
Unit Type and Rh: 7300

## 2016-08-27 ENCOUNTER — Ambulatory Visit (HOSPITAL_COMMUNITY)
Admission: RE | Admit: 2016-08-27 | Discharge: 2016-08-27 | Disposition: A | Discharge status: Home / Self Care | Payer: BLUE CROSS/BLUE SHIELD | Source: Ambulatory Visit | Attending: Gastroenterology | Admitting: Gastroenterology

## 2016-08-27 NOTE — Addendum Note (Signed)
Addendum  created 08/27/16 1346 by Rica Koyanagi, MD   Sign clinical note

## 2016-08-27 NOTE — Anesthesia Postprocedure Evaluation (Signed)
Anesthesia Post Note  Patient: Benjamin Diaz.  Procedure(s) Performed: Procedure(s) (LRB): ENDOSCOPIC RETROGRADE CHOLANGIOPANCREATOGRAPHY (ERCP) (N/A)     Anesthesia Post Evaluation  Last Vitals:  Vitals:   07/10/16 1949 07/11/16 0556  BP: 114/75 113/89  Pulse: 88 91  Resp: 16 18  Temp: 36.7 C 36.7 C    Last Pain:  Vitals:   07/11/16 0945  TempSrc:   PainSc: 0-No pain                 Alitzel Cookson,JAMES TERRILL

## 2016-08-31 ENCOUNTER — Encounter (HOSPITAL_COMMUNITY): Payer: Self-pay | Admitting: *Deleted

## 2016-08-31 ENCOUNTER — Other Ambulatory Visit: Payer: Self-pay | Admitting: Gastroenterology

## 2016-09-01 ENCOUNTER — Ambulatory Visit (HOSPITAL_COMMUNITY): Payer: BLUE CROSS/BLUE SHIELD | Admitting: Registered Nurse

## 2016-09-01 ENCOUNTER — Other Ambulatory Visit: Payer: Self-pay | Admitting: Gastroenterology

## 2016-09-01 ENCOUNTER — Ambulatory Visit (HOSPITAL_COMMUNITY): Admit: 2016-09-01 | Payer: BLUE CROSS/BLUE SHIELD | Admitting: Gastroenterology

## 2016-09-01 ENCOUNTER — Ambulatory Visit (HOSPITAL_COMMUNITY): Payer: BLUE CROSS/BLUE SHIELD

## 2016-09-01 ENCOUNTER — Encounter (HOSPITAL_COMMUNITY): Payer: Self-pay

## 2016-09-01 ENCOUNTER — Ambulatory Visit (HOSPITAL_COMMUNITY): Payer: BLUE CROSS/BLUE SHIELD | Admitting: Anesthesiology

## 2016-09-01 ENCOUNTER — Encounter (HOSPITAL_COMMUNITY): Payer: Self-pay | Admitting: *Deleted

## 2016-09-01 ENCOUNTER — Ambulatory Visit (HOSPITAL_COMMUNITY)
Admission: RE | Admit: 2016-09-01 | Discharge: 2016-09-01 | Disposition: A | Discharge status: Home / Self Care | Payer: BLUE CROSS/BLUE SHIELD | Source: Ambulatory Visit | Attending: Gastroenterology | Admitting: Gastroenterology

## 2016-09-01 ENCOUNTER — Encounter (HOSPITAL_COMMUNITY)
Admission: RE | Disposition: A | Discharge status: Home / Self Care | Payer: Self-pay | Source: Ambulatory Visit | Attending: Gastroenterology

## 2016-09-01 DIAGNOSIS — R748 Abnormal levels of other serum enzymes: Secondary | ICD-10-CM | POA: Diagnosis not present

## 2016-09-01 DIAGNOSIS — K838 Other specified diseases of biliary tract: Secondary | ICD-10-CM | POA: Diagnosis not present

## 2016-09-01 DIAGNOSIS — F17210 Nicotine dependence, cigarettes, uncomplicated: Secondary | ICD-10-CM | POA: Insufficient documentation

## 2016-09-01 DIAGNOSIS — I10 Essential (primary) hypertension: Secondary | ICD-10-CM | POA: Diagnosis not present

## 2016-09-01 DIAGNOSIS — K8689 Other specified diseases of pancreas: Secondary | ICD-10-CM | POA: Diagnosis not present

## 2016-09-01 DIAGNOSIS — K831 Obstruction of bile duct: Secondary | ICD-10-CM | POA: Insufficient documentation

## 2016-09-01 DIAGNOSIS — J449 Chronic obstructive pulmonary disease, unspecified: Secondary | ICD-10-CM | POA: Diagnosis not present

## 2016-09-01 DIAGNOSIS — X58XXXA Exposure to other specified factors, initial encounter: Secondary | ICD-10-CM | POA: Diagnosis not present

## 2016-09-01 DIAGNOSIS — C240 Malignant neoplasm of extrahepatic bile duct: Secondary | ICD-10-CM | POA: Insufficient documentation

## 2016-09-01 DIAGNOSIS — T189XXA Foreign body of alimentary tract, part unspecified, initial encounter: Secondary | ICD-10-CM

## 2016-09-01 DIAGNOSIS — Z4689 Encounter for fitting and adjustment of other specified devices: Secondary | ICD-10-CM

## 2016-09-01 DIAGNOSIS — T182XXA Foreign body in stomach, initial encounter: Secondary | ICD-10-CM | POA: Insufficient documentation

## 2016-09-01 DIAGNOSIS — K3189 Other diseases of stomach and duodenum: Secondary | ICD-10-CM | POA: Insufficient documentation

## 2016-09-01 HISTORY — PX: EUS: SHX5427

## 2016-09-01 HISTORY — DX: Other fracture of left lower leg, initial encounter for closed fracture: S82.892A

## 2016-09-01 HISTORY — PX: BILIARY STENT PLACEMENT: SHX5538

## 2016-09-01 HISTORY — PX: ESOPHAGOGASTRODUODENOSCOPY (EGD) WITH PROPOFOL: SHX5813

## 2016-09-01 HISTORY — PX: FOREIGN BODY REMOVAL: SHX962

## 2016-09-01 HISTORY — PX: FINE NEEDLE ASPIRATION: SHX5430

## 2016-09-01 HISTORY — PX: ERCP: SHX5425

## 2016-09-01 HISTORY — DX: Sciatica, unspecified side: M54.30

## 2016-09-01 SURGERY — ESOPHAGEAL ENDOSCOPIC ULTRASOUND (EUS) RADIAL
Anesthesia: General

## 2016-09-01 SURGERY — ESOPHAGOGASTRODUODENOSCOPY (EGD) WITH PROPOFOL
Anesthesia: Monitor Anesthesia Care

## 2016-09-01 MED ORDER — ROCURONIUM BROMIDE 100 MG/10ML IV SOLN
INTRAVENOUS | Status: DC | PRN
  Administered 2016-09-01: 50 mg via INTRAVENOUS

## 2016-09-01 MED ORDER — CIPROFLOXACIN IN D5W 400 MG/200ML IV SOLN
INTRAVENOUS | Status: AC
  Filled 2016-09-01: qty 200

## 2016-09-01 MED ORDER — LIDOCAINE 2% (20 MG/ML) 5 ML SYRINGE
INTRAMUSCULAR | Status: AC
  Filled 2016-09-01: qty 5

## 2016-09-01 MED ORDER — PROPOFOL 10 MG/ML IV BOLUS
INTRAVENOUS | Status: DC | PRN
  Administered 2016-09-01: 150 mg via INTRAVENOUS
  Administered 2016-09-01: 20 mg via INTRAVENOUS

## 2016-09-01 MED ORDER — SODIUM CHLORIDE 0.9 % IV SOLN
INTRAVENOUS | Status: DC | PRN
  Administered 2016-09-01: 40 mL

## 2016-09-01 MED ORDER — ONDANSETRON HCL 4 MG/2ML IJ SOLN
INTRAMUSCULAR | Status: AC
  Filled 2016-09-01: qty 2

## 2016-09-01 MED ORDER — DEXAMETHASONE SODIUM PHOSPHATE 10 MG/ML IJ SOLN
INTRAMUSCULAR | Status: DC | PRN
  Administered 2016-09-01: 8 mg via INTRAVENOUS

## 2016-09-01 MED ORDER — FENTANYL CITRATE (PF) 100 MCG/2ML IJ SOLN
INTRAMUSCULAR | Status: AC
  Filled 2016-09-01: qty 2

## 2016-09-01 MED ORDER — PROPOFOL 10 MG/ML IV BOLUS
INTRAVENOUS | Status: DC | PRN
  Administered 2016-09-01 (×2): 20 mg via INTRAVENOUS

## 2016-09-01 MED ORDER — SUGAMMADEX SODIUM 200 MG/2ML IV SOLN
INTRAVENOUS | Status: DC | PRN
  Administered 2016-09-01: 150 mg via INTRAVENOUS

## 2016-09-01 MED ORDER — INDOMETHACIN 50 MG RE SUPP
RECTAL | Status: AC
  Filled 2016-09-01: qty 2

## 2016-09-01 MED ORDER — LIDOCAINE HCL (CARDIAC) 20 MG/ML IV SOLN
INTRAVENOUS | Status: DC | PRN
  Administered 2016-09-01: 75 mg via INTRAVENOUS
  Administered 2016-09-01: 25 mg via INTRATRACHEAL

## 2016-09-01 MED ORDER — ROCURONIUM BROMIDE 50 MG/5ML IV SOSY
PREFILLED_SYRINGE | INTRAVENOUS | Status: AC
Start: 2016-09-01 — End: 2016-09-01
  Filled 2016-09-01: qty 5

## 2016-09-01 MED ORDER — PROPOFOL 10 MG/ML IV BOLUS
INTRAVENOUS | Status: AC
  Filled 2016-09-01: qty 40

## 2016-09-01 MED ORDER — LIDOCAINE 2% (20 MG/ML) 5 ML SYRINGE
INTRAMUSCULAR | Status: DC | PRN
  Administered 2016-09-01: 100 mg via INTRAVENOUS

## 2016-09-01 MED ORDER — CIPROFLOXACIN IN D5W 400 MG/200ML IV SOLN
400.0000 mg | Freq: Once | INTRAVENOUS | Status: AC
  Administered 2016-09-01: 400 mg via INTRAVENOUS

## 2016-09-01 MED ORDER — PROPOFOL 10 MG/ML IV BOLUS
INTRAVENOUS | Status: AC
Start: 2016-09-01 — End: 2016-09-01
  Filled 2016-09-01: qty 20

## 2016-09-01 MED ORDER — LACTATED RINGERS IV SOLN
INTRAVENOUS | Status: DC
  Administered 2016-09-01: 1000 mL via INTRAVENOUS

## 2016-09-01 MED ORDER — FENTANYL CITRATE (PF) 100 MCG/2ML IJ SOLN
INTRAMUSCULAR | Status: DC | PRN
  Administered 2016-09-01 (×2): 25 ug via INTRAVENOUS

## 2016-09-01 MED ORDER — ONDANSETRON HCL 4 MG/2ML IJ SOLN
INTRAMUSCULAR | Status: DC | PRN
  Administered 2016-09-01: 4 mg via INTRAVENOUS

## 2016-09-01 MED ORDER — INDOMETHACIN 50 MG RE SUPP
RECTAL | Status: DC | PRN
Start: 2016-09-01 — End: 2016-09-01
  Administered 2016-09-01: 100 mg via RECTAL

## 2016-09-01 MED ORDER — PROPOFOL 500 MG/50ML IV EMUL
INTRAVENOUS | Status: DC | PRN
  Administered 2016-09-01: 140 ug/kg/min via INTRAVENOUS

## 2016-09-01 MED ORDER — GLYCOPYRROLATE 0.2 MG/ML IV SOSY
PREFILLED_SYRINGE | INTRAVENOUS | Status: AC
  Filled 2016-09-01: qty 5

## 2016-09-01 MED ORDER — GLUCAGON HCL RDNA (DIAGNOSTIC) 1 MG IJ SOLR
INTRAMUSCULAR | Status: AC
  Filled 2016-09-01: qty 1

## 2016-09-01 MED ORDER — SODIUM CHLORIDE 0.9 % IV SOLN: INTRAVENOUS | Status: DC

## 2016-09-01 NOTE — Progress Notes (Signed)
During recovery period, patient stated he had permanent bridge that was no longer in his mouth since procedure. Pt. Described piece as being silver colored metal the length of 3 teeth, located in his back left mouth.   Pt. Was asked about any loose items in mouth prior to procedure and said nothing about a bridge.  While in recovery, patient stated that he has swallowed the piece once before and that it came out in his stool 3 days later.  Pt. Was not forthcoming with any of this information.  CRNA Marcie Bal notified, Ree Shay notified, Charge nurse [facility] made aware, Dr. Marcie Bal notified, Dr. Paulita Fujita made aware.  X-Ray ordered and done.  Results made available to Dr's Outlaw and Hodierne.    Pt. Monitored closely while in recovery, VSS, no complaints of N/V, Chest pain, trouble breathing.  Laverta Baltimore, RN

## 2016-09-01 NOTE — Anesthesia Preprocedure Evaluation (Signed)
Anesthesia Evaluation  Patient identified by MRN, date of birth, ID band Patient awake    Reviewed: Allergy & Precautions, H&P , NPO status , Patient's Chart, lab work & pertinent test results  Airway Mallampati: II   Neck ROM: full    Dental   Pulmonary COPD, Current Smoker,    breath sounds clear to auscultation       Cardiovascular hypertension, + dysrhythmias Atrial Fibrillation  Rhythm:regular Rate:Normal     Neuro/Psych  Neuromuscular disease    GI/Hepatic Pancreatic mass   Endo/Other    Renal/GU      Musculoskeletal   Abdominal   Peds  Hematology   Anesthesia Other Findings   Reproductive/Obstetrics                             Anesthesia Physical Anesthesia Plan  ASA: III  Anesthesia Plan: General   Post-op Pain Management:    Induction: Intravenous  PONV Risk Score and Plan: 1 and Ondansetron, Treatment may vary due to age and Propofol  Airway Management Planned: Oral ETT  Additional Equipment:   Intra-op Plan:   Post-operative Plan: Extubation in OR  Informed Consent: I have reviewed the patients History and Physical, chart, labs and discussed the procedure including the risks, benefits and alternatives for the proposed anesthesia with the patient or authorized representative who has indicated his/her understanding and acceptance.     Plan Discussed with: CRNA, Anesthesiologist and Surgeon  Anesthesia Plan Comments:         Anesthesia Quick Evaluation

## 2016-09-01 NOTE — H&P (Signed)
Post procedure(ERCP and EUS),in the recovery room, patient noted that his metal bridge was missing.  Abdominal X ray performed subsequently showed the metallic foreign body in stomach.  Patient is awake, not in distress and has no abdominal pain. As the metal bridge was noted in the stomach, will perform an EGD to remove it.  Patient cannot sign consent as he received sedation for the prior procedure. Discussed with Dr.Hodierne (anesthesiologist), will perform procedure under monitored anesthesia care.  Two physicians, I and Dr.Hodierne will document in the consent form, the indication for the procedure. Patient is awake and oriented X 3, understands the risks and benefits of the procedure.  [name], MD

## 2016-09-01 NOTE — Transfer of Care (Signed)
Immediate Anesthesia Transfer of Care Note  Patient: Benjamin Diaz.  Procedure(s) Performed: Procedure(s): ESOPHAGEAL ENDOSCOPIC ULTRASOUND (EUS) RADIAL (N/A) BILIARY STENT PLACEMENT (N/A) ENDOSCOPIC RETROGRADE CHOLANGIOPANCREATOGRAPHY (ERCP) (N/A) FINE NEEDLE ASPIRATION (FNA) RADIAL (N/A)  Patient Location: PACU  Anesthesia Type:General  Level of Consciousness: awake, alert , oriented and patient cooperative  Airway & Oxygen Therapy: Patient Spontanous Breathing and Patient connected to face mask oxygen  Post-op Assessment: Report given to RN, Post -op Vital signs reviewed and stable and Patient moving all extremities X 4  Post vital signs: stable  Last Vitals:  Vitals:   09/01/16 0859 09/01/16 1340  BP: 117/77   Pulse: 75 80  Resp: 13 20  Temp: 36.4 C 36.4 C    Last Pain:  Vitals:   09/01/16 1340  TempSrc: Oral         Complications: No apparent anesthesia complications

## 2016-09-01 NOTE — Op Note (Addendum)
[facility] Patient Name: Benjamin Diaz Procedure Date: 09/01/2016 MRN: [id number] Attending MD: [name] , MD Date of Birth: 1960/10/12 CSN: [id number] Age: 56 Admit Type: Outpatient Procedure:                Upper GI endoscopy Indications:              Foreign body in the stomach Providers:                [name], MD, Cleda Daub, RN, Laverta Baltimore                            RN, RN, Cherylynn Ridges, Technician, Marcene Duos,                            Technician, Enrigue Catena, CRNA, Danley Danker, CRNA Referring MD:              Medicines:                Monitored Anesthesia Care Complications:            No immediate complications. Estimated Blood Loss:     Estimated blood loss: none. Procedure:                Pre-Anesthesia Assessment:                           - Prior to the procedure, a History and Physical                            was performed, and patient medications and                            allergies were reviewed. The patient's tolerance of                            previous anesthesia was also reviewed. The risks                            and benefits of the procedure and the sedation                            options and risks were discussed with the patient.                            All questions were answered, and informed consent                            was obtained. Prior Anticoagulants: The patient has                            taken no previous anticoagulant or antiplatelet                            agents. ASA Grade Assessment: III - A patient with  severe systemic disease. After reviewing the risks                            and benefits, the patient was deemed in                            satisfactory condition to undergo the procedure.                           - Prior to the procedure, a History and Physical                            was performed, and patient medications and              allergies were reviewed. The patient's tolerance of                            previous anesthesia was also reviewed. The risks                            and benefits of the procedure and the sedation                            options and risks were discussed with the patient.                            All questions were answered, and informed consent                            was obtained. Prior Anticoagulants: The patient has                            taken no previous anticoagulant or antiplatelet                            agents. ASA Grade Assessment: III - A patient with                            severe systemic disease. After reviewing the risks                            and benefits, the patient was deemed in                            satisfactory condition to undergo the procedure.                           - Prior to the procedure, a History and Physical                            was performed, and patient medications and                            allergies  were reviewed. The patient's tolerance of                            previous anesthesia was also reviewed. The risks                            and benefits of the procedure and the sedation                            options and risks were discussed with the patient.                            All questions were answered, and informed consent                            was obtained. Prior Anticoagulants: The patient has                            taken no previous anticoagulant or antiplatelet                            agents. ASA Grade Assessment: II - A patient with                            mild systemic disease. After reviewing the risks                            and benefits, the patient was deemed in                            satisfactory condition to undergo the procedure.                           After obtaining informed consent, the endoscope was                            passed under direct  vision. Throughout the                            procedure, the patient's blood pressure, pulse, and                            oxygen saturations were monitored continuously. The                            was introduced through the mouth, and advanced to                            the duodenal bulb. The upper GI endoscopy was                            accomplished without difficulty. The patient  tolerated the procedure well. Scope In: Scope Out: Findings:      The examined esophagus was normal.      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 Removal was       accomplished with a basket.      A few dispersed, small non-bleeding erosions were found in the gastric       body. There were no stigmata of recent bleeding.      The cardia and gastric fundus were normal on retroflexion.      The duodenal bulb was normal. Impression:               - Normal esophagus.                           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                            Removal was successful.                           - Non-bleeding erosive gastropathy.                           - Normal duodenal bulb. Moderate Sedation:      Patient did not receive moderate sedation for this procedure, but       instead received monitored anesthesia care. Recommendation:           - Discharge patient to home.                           - Resume regular diet.                           - Continue present medications.                           - Patient has a contact number available for                            emergencies. The signs and symptoms of potential                            delayed complications were discussed with the                            patient. Return to normal activities tomorrow.                            Written discharge instructions were provided to the                            patient. Procedure Code(s):        --- Professional ---                            [id number], Esophagogastroduodenoscopy, flexible,  transoral; with removal of foreign body(s) Diagnosis Code(s):        --- Professional ---                           N46.2VOJ, Foreign body in stomach, initial encounter                           K31.89, Other diseases of stomach and duodenum CPT copyright 2016 [employer]. All rights reserved. The codes documented in this report are preliminary and upon coder review may  be revised to meet current compliance requirements. [name], MD 09/01/2016 3:49:51 PM This report has been signed electronically. Number of Addenda: 0

## 2016-09-01 NOTE — H&P (Signed)
Patient interval history reviewed.  Patient examined again.  There has been no change from documented H/P dated 08/11/16 (scanned into chart from our office) except as documented above.  Assessment:  1.  Obstructive jaundice with pancreatic mass.  Plastic bile duct stent in place, but jaundice persists.  No tissue diagnosis.  Plan:  1.  Endoscopic ultrasound with anticipated fine needle aspiration. 2.  Risks (bleeding, infection, bowel perforation that could require surgery, sedation-related changes in cardiopulmonary systems), benefits (identification and possible treatment of source of symptoms, exclusion of certain causes of symptoms), and alternatives (watchful waiting, radiographic imaging studies, empiric medical treatment) of upper endoscopy with ultrasound and possible fine needle aspiration (EUS +/- FNA) were explained to patient/family in detail and patient wishes to proceed. 3.  Endoscopic retrograde cholangiopancreatography with bile duct stent removal (to send for pathology) and replacement. 4.  Risks (up to and including bleeding, infection, perforation, pancreatitis that can be complicated by infected necrosis and death), benefits (removal of stones, alleviating blockage, decreasing risk of cholangitis or choledocholithiasis-related pancreatitis), and alternatives (watchful waiting, percutaneous transhepatic cholangiography) of ERCP were explained to patient/family in detail and patient elects to proceed.

## 2016-09-01 NOTE — Anesthesia Procedure Notes (Signed)
Procedure Name: Intubation Date/Time: 09/01/2016 11:24 AM Performed by: Lissa Morales Pre-anesthesia Checklist: Patient identified, Emergency Drugs available, Suction available and Patient being monitored Patient Re-evaluated:Patient Re-evaluated prior to inductionOxygen Delivery Method: Circle system utilized Preoxygenation: Pre-oxygenation with 100% oxygen Intubation Type: IV induction Ventilation: Mask ventilation without difficulty Laryngoscope Size: Mac and 4 Grade View: Grade II Tube type: Oral Tube size: 7.5 mm Number of attempts: 1 Airway Equipment and Method: Stylet and Oral airway Placement Confirmation: ETT inserted through vocal cords under direct vision,  positive ETCO2 and breath sounds checked- equal and bilateral Secured at: 22 cm Tube secured with: Tape Dental Injury: Teeth and Oropharynx as per pre-operative assessment

## 2016-09-01 NOTE — Brief Op Note (Signed)
09/01/2016  3:35 PM  PATIENT:  Benjamin Diaz.  56 y.o. male  PRE-OPERATIVE DIAGNOSIS:  foreign body removal  POST-OPERATIVE DIAGNOSIS:  Gastric erosions, foriegn body removal.  PROCEDURE:  Procedure(s): ESOPHAGOGASTRODUODENOSCOPY (EGD) WITH PROPOFOL (N/A) FOREIGN BODY REMOVAL (N/A)  SURGEON:  Surgeon(s) and Role:    [name], MD - Primary  PHYSICIAN ASSISTANT:   ASSISTANTS: none   ANESTHESIA:   MAC  EBL:  Total I/O In: 950 [I.V.:950] Out: 0   BLOOD ADMINISTERED:none  DRAINS: none   LOCAL MEDICATIONS USED:  NONE  SPECIMEN:  No Specimen  DISPOSITION OF SPECIMEN:  N/A  COUNTS:  YES  TOURNIQUET:  * No tourniquets in log *  DICTATION: .Dragon Dictation  PLAN OF CARE: Discharge to home after PACU  PATIENT DISPOSITION:  PACU - hemodynamically stable.   Delay start of Pharmacological VTE agent (>24hrs) due to surgical blood loss or risk of bleeding: no

## 2016-09-01 NOTE — Discharge Instructions (Signed)
Endoscopic Retrograde Cholangiopancreatography (ERCP) °ERCP stands for endoscopic retrograde cholangiopancreatography. In this procedure, a thin, lighted tube (endoscope) is used. It is passed through the mouth and down the back of the throat into the upper part of the intestine, called the duodenum. A small, plastic tube (cannula) is then passed through the endoscope. It is directed into the bile duct or pancreatic duct. Dye is then injected through the tube. X-rays are taken to study the biliary and pancreatic passageways. This procedure is used to diagnosis many diseases of the pancreas, bile ducts, liver, and gallbladder. °LET YOUR CAREGIVER KNOW ABOUT:  °· Allergies to food or medicine.  °· Medicines taken, including vitamins, herbs, eyedrops, over-the-counter medicines, and creams.  °· Use of steroids (by mouth or creams).  °· Previous problems with anesthetics or numbing medicines.  °· History of bleeding problems or blood clots.  °· Previous surgery.  °· Other health problems, including diabetes and kidney problems.  °· Possibility of pregnancy, if this applies.  °· Any barium X-rays during the past week.  °BEFORE THE PROCEDURE  °· Do not eat or drink anything, including water, for at least 6 hours before the procedure.  °· Ask your caregiver whether you should stop taking certain medicines prior to your procedure.  °· Arrange for someone to drive you home. You will not be allowed to drive for several hours after the procedure.  °· Arrive at least 60 minutes before the procedure or as directed. This will give you time to check in and fill out any necessary paperwork.  °PROCEDURE  °· You will be given medicine through a vein (intravenously) to make you relaxed and sleepy.  °· You might have a breathing tube placed to give you medicine that makes you sleep (general anesthetic).  °· Your throat may be sprayed with medicine that numbs the area (local anesthetic).  °· You will lie on your left side. The endoscope  will be inserted through your mouth and into the duodenum. The tube will not interfere with your breathing. Gagging is prevented by the anesthesia.  °· While X-rays are being taken, you may be positioned on your stomach.  °During the ERCP, the person performing the procedure may identify a blockage or narrowed opening to the bile duct. A muscular portion of the main bile duct may be partially cut (sphincterotomy). A thin, plastic tube (stent) will be positioned inside your bile duct. This is to allow fluid secreted by the liver (bile) to flow more easily through the narrowed opening. °AFTER THE PROCEDURE  °· You will rest in bed until you are fully conscious.  °· When you first wake up, your throat may feel slightly sore.  °· You will not be allowed to eat or drink until numbness subsides.  °· Once you are able to drink, urinate, and sit on the edge of the bed without feeling sick to your stomach (nauseous) or dizzy, you may be allowed to go home.  °· Have a friend or family member with you for the first 24 hours after your procedure.  °SEEK MEDICAL CARE IF:  °· You have an oral temperature above 102° F (38.9° C).  °· You develop other signs of infection, including chills or feeling unwell.  °· You have abdominal pain.  °· You have questions or concerns.  °MAKE SURE YOU:  °· Understand these instructions.  °· Will watch your condition.  °· Will get help right away if you are not doing well or get worse.  °  Document Released: 12/08/2000 Document Revised: 11/25/2010 Document Reviewed: 07/01/2009 °[employer]® Patient Information ©2012 [employer]. ° °

## 2016-09-01 NOTE — Op Note (Signed)
EGD was performed. The metallic bridge was noted in the gastric body. It was retrieved with a retrieval net 30 mm in size. Gastric erosions were noted. There was no evidence of active ongoing bleeding.  [name], M.D.

## 2016-09-01 NOTE — Op Note (Signed)
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 Estimated Blood Loss:     Estimated blood loss was minimal. Procedure:                Pre-Anesthesia Assessment:                           - Prior to the procedure, a History and Physical                            was performed, and patient medications and                            allergies were reviewed. The patient's tolerance of                            previous anesthesia was also reviewed. The risks                            and benefits of the procedure and the sedation                            options and risks were discussed with the patient.                            All questions were answered, and informed consent                            was obtained. Prior Anticoagulants: The patient has                            taken no previous anticoagulant or antiplatelet  agents. ASA Grade Assessment: III - A patient with                            severe systemic disease. After reviewing the risks                            and benefits, the patient was deemed in                            satisfactory condition to undergo the procedure.                           After obtaining informed consent, the endoscope was                             passed under direct vision. Throughout the                            procedure, the patient's blood pressure, pulse, and                            oxygen saturations were monitored continuously. The                            [id number] ([id number]) scope was introduced through                            the mouth, and advanced to the second part of                            duodenum. The upper EUS was accomplished without                            difficulty. The patient tolerated the procedure                            well. Scope In: Scope Out: Findings:      Endosonographic Finding :      Prior to start of EUS, bile duct stent was removed with snare (this will       be detailed in the ERCP portion of the report).      Moderate hyperechoic material consistent with sludge was visualized       endosonographically in the gallbladder.      There was dilation in the common bile duct which measured up to 15 mm.       There was sludge in the bile duct.      There was no sign of significant endosonographic abnormality in the left       lobe of the liver.      No lymphadenopathy seen.      An irregular and very vague region was identified in the pancreatic       head. The mass was hypoechoic. The mass measured 15 mm by 20 mm in       maximal cross-sectional diameter. Lots of overlying vasculature. The  endosonographic borders were poorly-defined. Abuts but does not seem to       invade main portal vein. Fine needle aspiration for cytology was       performed. Color Doppler imaging was utilized prior to needle puncture       to confirm a lack of significant vascular structures within the needle       path. Fine needle aspiration was performed with the 25 gauge needle       using a transduodenal approach. Final cytology results are pending. Impression:               - Hyperechoic material consistent with sludge was                            visualized endosonographically in the  gallbladder.                           - There was dilation in the common bile duct which                            measured up to 15 mm.                           - There was no evidence of significant pathology in                            the left lobe of the liver.                           - An irregular and very vague region was identified                            in the apical portion of pancreatic head abutting                            the main portal vein. Fine needle aspiration                            performed. Moderate Sedation:      None Recommendation:           - Await cytology results. (from FNA and removed                            bile duct stent).                           - Perform an ERCP today. Procedure Code(s):        --- Professional ---                           [phone], Esophagogastroduodenoscopy, flexible,                            transoral; with transendoscopic ultrasound-guided  intramural or transmural fine needle                            aspiration/biopsy(s) (includes endoscopic                            ultrasound examination of the esophagus, stomach,                            and either the duodenum or a surgically altered                            stomach where the jejunum is examined distal to the                            anastomosis) Diagnosis Code(s):        --- Professional ---                           K83.8, Other specified diseases of biliary tract                           K86.89, Other specified diseases of pancreas                           R74.8, Abnormal levels of other serum enzymes CPT copyright 2016 [employer]. All rights reserved. The codes documented in this report are preliminary and upon coder review may  be revised to meet current compliance requirements. [name], MD 09/01/2016 12:42:38 PM This report has been signed electronically. Number of Addenda: 0

## 2016-09-01 NOTE — Op Note (Signed)
[facility] Patient Name: Benjamin Diaz Procedure Date: 09/01/2016 MRN: [id number] Attending MD: [name] , MD Date of Birth: Nov 03, 1960 CSN: [id number] Age: 56 Admit Type: Outpatient Procedure:                ERCP Indications:              Biliary dilation on Ultrasound, Jaundice, Elevated                            liver enzymes, Malignant tumor of the head of                            pancreas Providers:                [name], MD, Otis Brace, MD, Cleda Daub, RN, Cherylynn Ridges, Technician, Enrigue Catena,                            CRNA Referring MD:              Medicines:                General Anesthesia, Indomethacin 100 mg PR, Cipro                            448 mg IV Complications:            No immediate complications. Estimated Blood Loss:     Estimated blood loss was minimal. Procedure:                Pre-Anesthesia Assessment:                           - Prior to the procedure, a History and Physical                            was performed, and patient medications and                            allergies were reviewed. The patient's tolerance of                            previous anesthesia was also reviewed. The risks                            and benefits of the procedure and the sedation                            options and risks were discussed with the patient.                            All questions were answered, and informed consent                            was obtained. Prior Anticoagulants: The  patient has                            taken no previous anticoagulant or antiplatelet                            agents. ASA Grade Assessment: III - A patient with                            severe systemic disease. After reviewing the risks                            and benefits, the patient was deemed in                            satisfactory condition to undergo the procedure.          - Prior to the procedure, a History and Physical                            was performed, and patient medications and                            allergies were reviewed. The patient's tolerance of                            previous anesthesia was also reviewed. The risks                            and benefits of the procedure and the sedation                            options and risks were discussed with the patient.                            All questions were answered, and informed consent                            was obtained. Prior Anticoagulants: The patient has                            taken no previous anticoagulant or antiplatelet                            agents. ASA Grade Assessment: III - A patient with                            severe systemic disease. After reviewing the risks                            and benefits, the patient was deemed in                            satisfactory condition to undergo the procedure.  After obtaining informed consent, the scope was                            passed under direct vision. Throughout the                            procedure, the patient's blood pressure, pulse, and                            oxygen saturations were monitored continuously. The                            was introduced through the mouth, and used to                            inject contrast into and used to inject contrast                            into the bile duct. The ERCP was accomplished                            without difficulty. The patient tolerated the                            procedure well. Scope In: Scope Out: Findings:      During EUS procedure, one plastic stent was removed from the biliary       tree using a snare and sent for cytology. ERCP procedure was performed       jointly by myself and Dr. Alessandra Bevels; I was present and actively       participating during all portions of the procedure. The  scout film was       then normal. The major papilla was normal except for evidence of       previous mild biliary sphincterotomy. The bile duct was deeply       cannulated. Contrast was injected. I personally interpreted the bile       duct images. Ductal flow of contrast was adequate. Image quality was       adequate. Contrast extended to the main bile duct. Contrast extended to       the hepatic ducts. The upper third of the main bile duct, common hepatic       duct and hepatic duct bifurcation were moderately dilated, secondary to       a 3cm long mid-cbd stricture. The stricture was sampled with cytology       brush. The largest bile duct diameter was 15 mm. One 10 mm by 6 cm       covered metal stent was placed into the common bile duct across the       stricture. Sludge flowed through the stent. The stent was in good       position endoscopically and fluoroscopically. Pancreatogram was not       obtained, intentionally. Impression:               - The major papilla appeared normal except for  evidence of prior small biliary sphincterotomy.                           - The upper third of the main bile duct and common                            hepatic duct and intrahepatic ducts were moderately                            dilated due to 3cm long mid-CBD stricture.                           - One stent was removed from the biliary tree prior                            to beginning of EUS/ERCP procedures.                           - One covered metal stent was placed into the                            common bile duct with good bile flow post-procedure. Moderate Sedation:      None Recommendation:           - Watch for pancreatitis, bleeding, perforation,                            and cholangitis.                           - Await cytology results (FNA, stent, stricture                            brushings).                           - Avoid aspirin and  nonsteroidal anti-inflammatory                            medicines for 3 days.                           - Return to GI clinic PRN.                           - Potential Whipple surgery planned within the next                            2-3 weeks. Procedure Code(s):        --- Professional ---                           [phone], Endoscopic retrograde                            cholangiopancreatography (ERCP); with removal and  exchange of stent(s), biliary or pancreatic duct,                            including pre- and post-dilation and guide wire                            passage, when performed, including sphincterotomy,                            when performed, each stent exchanged Diagnosis Code(s):        --- Professional ---                           K83.1, Obstruction of bile duct                           Z46.59, Encounter for fitting and adjustment of                            other gastrointestinal appliance and device                           R17, Unspecified jaundice                           R74.8, Abnormal levels of other serum enzymes                           C25.0, Malignant neoplasm of head of pancreas                           K83.8, Other specified diseases of biliary tract CPT copyright 2016 [employer]. All rights reserved. The codes documented in this report are preliminary and upon coder review may  be revised to meet current compliance requirements. [name], MD 09/01/2016 1:32:30 PM This report has been signed electronically. Number of Addenda: 0

## 2016-09-01 NOTE — Anesthesia Preprocedure Evaluation (Signed)
Anesthesia Evaluation  Patient identified by MRN, date of birth, ID band Patient awake    Reviewed: Allergy & Precautions, H&P , NPO status , Patient's Chart, lab work & pertinent test results  Airway Mallampati: II   Neck ROM: full    Dental   Pulmonary COPD, Current Smoker,    breath sounds clear to auscultation       Cardiovascular hypertension, + dysrhythmias Atrial Fibrillation  Rhythm:regular Rate:Normal     Neuro/Psych  Neuromuscular disease    GI/Hepatic Pancreatic mass.     Endo/Other    Renal/GU      Musculoskeletal   Abdominal   Peds  Hematology   Anesthesia Other Findings   Reproductive/Obstetrics                             Anesthesia Physical  Anesthesia Plan  ASA: III  Anesthesia Plan: MAC   Post-op Pain Management:    Induction: Intravenous  PONV Risk Score and Plan: 0 and Propofol  Airway Management Planned: Nasal Cannula  Additional Equipment:   Intra-op Plan:   Post-operative Plan:   Informed Consent: I have reviewed the patients History and Physical, chart, labs and discussed the procedure including the risks, benefits and alternatives for the proposed anesthesia with the patient or authorized representative who has indicated his/her understanding and acceptance.     Plan Discussed with: CRNA, Anesthesiologist and Surgeon  Anesthesia Plan Comments:         Anesthesia Quick Evaluation

## 2016-09-01 NOTE — Transfer of Care (Signed)
Immediate Anesthesia Transfer of Care Note  Patient: Benjamin Diaz.  Procedure(s) Performed: Procedure(s): ESOPHAGOGASTRODUODENOSCOPY (EGD) WITH PROPOFOL (N/A) FOREIGN BODY REMOVAL (N/A)  Patient Location: Endoscopy Unit  Anesthesia Type:MAC  Level of Consciousness: awake  Airway & Oxygen Therapy: Patient Spontanous Breathing and Patient connected to nasal cannula oxygen  Post-op Assessment: Report given to RN and Post -op Vital signs reviewed and stable  Post vital signs: Reviewed and stable  Last Vitals:  Vitals:   09/01/16 1340 09/01/16 1430  BP:  119/72  Pulse: 80   Resp: 20   Temp: 36.4 C     Last Pain:  Vitals:   09/01/16 1340  TempSrc: Oral         Complications: No apparent anesthesia complications

## 2016-09-03 ENCOUNTER — Encounter (HOSPITAL_COMMUNITY): Payer: Self-pay | Admitting: Gastroenterology

## 2016-09-06 NOTE — Anesthesia Postprocedure Evaluation (Signed)
Anesthesia Post Note  Patient: Benjamin Diaz.  Procedure(s) Performed: Procedure(s) (LRB): ESOPHAGEAL ENDOSCOPIC ULTRASOUND (EUS) RADIAL (N/A) BILIARY STENT PLACEMENT (N/A) ENDOSCOPIC RETROGRADE CHOLANGIOPANCREATOGRAPHY (ERCP) (N/A) FINE NEEDLE ASPIRATION (FNA) RADIAL (N/A)     Patient location during evaluation: PACU Anesthesia Type: General Level of consciousness: awake and alert and patient cooperative Pain management: pain level controlled Vital Signs Assessment: post-procedure vital signs reviewed and stable Respiratory status: spontaneous breathing and respiratory function stable Cardiovascular status: stable Anesthetic complications: no    Last Vitals:  Vitals:   09/01/16 1550 09/01/16 1600  BP: 116/63 123/79  Pulse: 80   Resp: (!) 22   Temp:      Last Pain:  Vitals:   09/01/16 1542  TempSrc: Oral                 Sorin Frimpong S

## 2016-09-06 NOTE — Anesthesia Postprocedure Evaluation (Signed)
Anesthesia Post Note  Patient: Benjamin Diaz.  Procedure(s) Performed: Procedure(s) (LRB): ESOPHAGOGASTRODUODENOSCOPY (EGD) WITH PROPOFOL (N/A) FOREIGN BODY REMOVAL (N/A)     Patient location during evaluation: PACU Anesthesia Type: MAC Level of consciousness: awake and alert Pain management: pain level controlled Vital Signs Assessment: post-procedure vital signs reviewed and stable Respiratory status: spontaneous breathing, nonlabored ventilation, respiratory function stable and patient connected to nasal cannula oxygen Cardiovascular status: stable and blood pressure returned to baseline Anesthetic complications: no    Last Vitals:  Vitals:   09/01/16 1550 09/01/16 1600  BP: 116/63 123/79  Pulse: 80   Resp: (!) 22   Temp:      Last Pain:  Vitals:   09/01/16 1542  TempSrc: Oral                 Ahilyn Nell S

## 2016-09-14 ENCOUNTER — Inpatient Hospital Stay (HOSPITAL_COMMUNITY)
Admission: RE | Admit: 2016-09-14 | Discharge: 2016-09-14 | Disposition: A | Discharge status: Home / Self Care | Payer: BLUE CROSS/BLUE SHIELD | Source: Ambulatory Visit

## 2016-09-14 NOTE — Pre-Procedure Instructions (Signed)
Benjamin Silvers Jr.  09/14/2016      [facility] [place], [address] [place] [address] Phone: [phone] Fax: [phone]  [facility] [address]), [place] [address] [place] [address] Phone: [phone] Fax: [phone]    Your procedure is scheduled on June 21  Report to [facility] [place].  Call this number if you have problems the morning of surgery:  [phone]   Remember:  Do not eat food or drink liquids after midnight.   Take these medicines the morning of surgery with A SIP OF WATER albuterol (PROVENTIL HFA) 108 (90 Base), Ipratropium-Albuterol (COMBIVENT RESPIMAT)   bring inhalers with you the morning of surgery,   7 days prior to surgery STOP taking any Aspirin, Aleve, Naproxen, Ibuprofen, Motrin, Advil, Goody's, BC's, all herbal medications, fish oil, and all vitamins    Do not wear jewelry  Do not wear lotions, powders, or cologne, or deoderant.  Men may shave face and neck.  Do not bring valuables to the hospital.  [facility] is not responsible for any belongings or valuables.  Contacts, dentures or bridgework may not be worn into surgery.  Leave your suitcase in the car.  After surgery it may be brought to your room.  For patients admitted to the hospital, discharge time will be determined by your treatment team.  Patients discharged the day of surgery will not be allowed to drive home.    Special instructions:   [facility]- Preparing For Surgery  Before surgery, you can play an important role. Because skin is not sterile, your skin needs to be as free of germs as possible. You can reduce the number of germs on your skin by washing with CHG (chlorahexidine gluconate) Soap before surgery.  CHG is an antiseptic cleaner which kills germs and bonds with the skin to continue killing germs even  after washing.  Please do not use if you have an allergy to CHG or antibacterial soaps. If your skin becomes reddened/irritated stop using the CHG.  Do not shave (including legs and underarms) for at least 48 hours prior to first CHG shower. It is OK to shave your face.  Please follow these instructions carefully.   1. Shower the NIGHT BEFORE SURGERY and the MORNING OF SURGERY with CHG.   2. If you chose to wash your hair, wash your hair first as usual with your normal shampoo.  3. After you shampoo, rinse your hair and body thoroughly to remove the shampoo.  4. Use CHG as you would any other liquid soap. You can apply CHG directly to the skin and wash gently with a scrungie or a clean washcloth.   5. Apply the CHG Soap to your body ONLY FROM THE NECK DOWN.  Do not use on open wounds or open sores. Avoid contact with your eyes, ears, mouth and genitals (private parts). Wash genitals (private parts) with your normal soap.  6. Wash thoroughly, paying special attention to the area where your surgery will be performed.  7. Thoroughly rinse your body with warm water from the neck down.  8. DO NOT shower/wash with your normal soap after using and rinsing off the CHG Soap.  9. Pat yourself dry with a CLEAN TOWEL.   10. Wear CLEAN PAJAMAS   11. Place CLEAN SHEETS on your bed the night of your  first shower and DO NOT SLEEP WITH PETS.    Day of Surgery: Do not apply any deodorants/lotions. Please wear clean clothes to the hospital/surgery center.      Please read over the following fact sheets that you were given.

## 2016-09-14 NOTE — Progress Notes (Signed)
Dr. Marlowe Aschoff nurse called to explain why patient was not at pre-op appointment.  Nurse stated that patient was extremely confused about where to be and that he had went to several locations before going to her office.  They drew lab work in the office.  Nurse is trying to get patient over to the hospital to be seen for his preop.  Will check for the lab work that the office drew as I comes available

## 2016-09-14 NOTE — Progress Notes (Signed)
I called back to the surgeons office they instructed the patient to come to the hospital for his appointment but the patient has not shown up yet

## 2016-09-15 ENCOUNTER — Encounter (HOSPITAL_COMMUNITY): Payer: Self-pay | Admitting: *Deleted

## 2016-09-15 NOTE — Progress Notes (Signed)
  Comprehensive Metabolic Panel   Collected: 09/14/2016 2:32 PM Sodium 134 mmol/L 135-146 mmol/L Collected: 09/14/2016 2:32 PM Potassium 4.5 mmol/L 3.5-5.3 mmol/L Collected: 09/14/2016 2:32 PM Chloride 93 mmol/L 98-110 mmol/L Collected: 09/14/2016 2:32 PM CO2 25 mmol/L 20-31 mmol/L Collected: 09/14/2016 2:32 PM Glucose 72 mg/dL 65-99 mg/dL Collected: 09/14/2016 2:32 PM BUN 12 mg/dL 7-25 mg/dL Collected: 09/14/2016 2:32 PM Creatinine 0.36 mg/dL 0.70-1.33 mg/dL Collected: 09/14/2016 2:32 PM Bilirubin, Total 3.1 mg/dL 0.2-1.2 mg/dL Collected: 09/14/2016 2:32 PM Alkaline Phosphatase 1139 U/L 40-115 U/L Collected: 09/14/2016 2:32 PM AST/SGOT 87 U/L 10-35 U/L Collected: 09/14/2016 2:32 PM ALT/SGPT 129 U/L 9-46 U/L Collected: 09/14/2016 2:32 PM Total Protein 6.8 g/dL 6.1-8.1 g/dL Collected: 09/14/2016 2:32 PM Albumin 3.5 g/dL 3.6-5.1 g/dL Collected: 09/14/2016 2:32 PM Calcium 9.2 mg/dL 8.6-10.3 mg/dL Collected: 09/14/2016 2:32 PM CBC with Diff   Collected: 09/14/2016 2:32 PM WBC 15.9 K/uL 3.8-10.8 K/uL Collected: 09/14/2016 2:32 PM RBC 3.92 MIL/uL 4.20-5.80 MIL/uL Collected: 09/14/2016 2:32 PM Hemoglobin 11.9 g/dL 13.2-17.1 g/dL Collected: 09/14/2016 2:32 PM Hematocrit 35.6 % 38.5-50.0 % Collected: 09/14/2016 2:32 PM MCV 90.8 fL 80.0-100.0 fL Collected: 09/14/2016 2:32 PM MCH 30.4 pg 27.0-33.0 pg Collected: 09/14/2016 2:32 PM MCHC 33.4 g/dL 32.0-36.0 g/dL Collected: 09/14/2016 2:32 PM RDW 16.4 % 11.0-15.0 % Collected: 09/14/2016 2:32 PM Platelet Count 643 K/uL 140-400 K/uL Collected: 09/14/2016 2:32 PM MPV 9.2 fL 7.5-12.5 fL Collected: 09/14/2016 2:32 PM Absolute Neut 11130 cells/uL 1500-7800 cells/uL Collected: 09/14/2016 2:32 PM Absolute Lymph 2544 cells/uL 845 340 9494 cells/uL Collected: 09/14/2016 2:32  PM Absolute Mono 1113 cells/uL 200-950 cells/uL Collected: 09/14/2016 2:32 PM Absolute Eos 954 cells/uL 15-500 cells/uL Collected: 09/14/2016 2:32 PM Absolute Baso 159 cells/uL 0-200 cells/uL Collected: 09/14/2016 2:32 PM Neutrophils % 70 %  Collected: 09/14/2016 2:32 PM Lymph % 16 %  Collected: 09/14/2016 2:32 PM Mono % 7 %  Collected: 09/14/2016 2:32 PM Eos % 6 %  Collected: 09/14/2016 2:32 PM Baso % 1 %  Collected: 09/14/2016 2:32 PM Smear Review Criteria for review not met  Collected: 09/14/2016 2:32 PM

## 2016-09-15 NOTE — Progress Notes (Signed)
Spoke with pt for pre-op call. Pt denies cardiac history, chest pain or sob. Pt states he is not diabetic. 

## 2016-09-16 ENCOUNTER — Telehealth: Payer: Self-pay

## 2016-09-16 ENCOUNTER — Encounter (HOSPITAL_COMMUNITY): Payer: Self-pay | Admitting: *Deleted

## 2016-09-16 ENCOUNTER — Inpatient Hospital Stay (HOSPITAL_COMMUNITY): Payer: BLUE CROSS/BLUE SHIELD | Admitting: Anesthesiology

## 2016-09-16 ENCOUNTER — Ambulatory Visit (HOSPITAL_COMMUNITY)
Admission: RE | Admit: 2016-09-16 | Discharge: 2016-09-16 | Disposition: A | Discharge status: Home / Self Care | Payer: BLUE CROSS/BLUE SHIELD | Source: Ambulatory Visit | Attending: General Surgery | Admitting: General Surgery

## 2016-09-16 ENCOUNTER — Encounter (HOSPITAL_COMMUNITY)
Admission: RE | Disposition: A | Discharge status: Home / Self Care | Payer: Self-pay | Source: Ambulatory Visit | Attending: General Surgery

## 2016-09-16 DIAGNOSIS — E46 Unspecified protein-calorie malnutrition: Secondary | ICD-10-CM | POA: Insufficient documentation

## 2016-09-16 DIAGNOSIS — C250 Malignant neoplasm of head of pancreas: Secondary | ICD-10-CM | POA: Diagnosis present

## 2016-09-16 DIAGNOSIS — F17210 Nicotine dependence, cigarettes, uncomplicated: Secondary | ICD-10-CM | POA: Insufficient documentation

## 2016-09-16 DIAGNOSIS — J449 Chronic obstructive pulmonary disease, unspecified: Secondary | ICD-10-CM | POA: Diagnosis not present

## 2016-09-16 DIAGNOSIS — C227 Other specified carcinomas of liver: Secondary | ICD-10-CM | POA: Diagnosis not present

## 2016-09-16 HISTORY — PX: DIAGNOSTIC LAPAROSCOPIC LIVER BIOPSY: SHX5797

## 2016-09-16 HISTORY — DX: Unspecified osteoarthritis, unspecified site: M19.90

## 2016-09-16 LAB — BASIC METABOLIC PANEL
Anion gap: 7 (ref 5–15)
CALCIUM: 9.2 mg/dL (ref 8.9–10.3)
CHLORIDE: 93 mmol/L — AB (ref 101–111)
CO2: 30 mmol/L (ref 22–32)
CREATININE: 0.51 mg/dL — AB (ref 0.61–1.24)
GFR calc Af Amer: >60 mL/min (ref >60)
GFR calc non Af Amer: >60 mL/min (ref >60)
Glucose, Bld: 106 mg/dL — ABNORMAL HIGH (ref 65–99)
Potassium: 3.5 mmol/L (ref 3.5–5.1)
Sodium: 130 mmol/L — ABNORMAL LOW (ref 135–145)

## 2016-09-16 LAB — CBC
HCT: 37.8 % — ABNORMAL LOW (ref 39.0–52.0)
Hemoglobin: 12.6 g/dL — ABNORMAL LOW (ref 13.0–17.0)
MCH: 30.2 pg (ref 26.0–34.0)
MCHC: 33.3 g/dL (ref 30.0–36.0)
MCV: 90.6 fL (ref 78.0–100.0)
PLATELETS: 594 10*3/uL — AB (ref 150–400)
RBC: 4.17 MIL/uL — ABNORMAL LOW (ref 4.22–5.81)
RDW: 16.1 % — AB (ref 11.5–15.5)
WBC: 15.2 10*3/uL — AB (ref 4.0–10.5)

## 2016-09-16 LAB — TYPE AND SCREEN
ABO/RH(D): B POS
ANTIBODY SCREEN: NEGATIVE

## 2016-09-16 LAB — ABO/RH: ABO/RH(D): B POS

## 2016-09-16 SURGERY — BIOPSY, LIVER, LAPAROSCOPIC
Anesthesia: Epidural | Site: Abdomen

## 2016-09-16 MED ORDER — LACTATED RINGERS IV SOLN
INTRAVENOUS | Status: DC | PRN
  Administered 2016-09-16: 07:00:00 via INTRAVENOUS

## 2016-09-16 MED ORDER — SODIUM CHLORIDE 0.9% FLUSH: 3.0000 mL | INTRAVENOUS | Status: DC | PRN

## 2016-09-16 MED ORDER — SODIUM CHLORIDE 0.9% FLUSH: 3.0000 mL | Freq: Two times a day (BID) | INTRAVENOUS | Status: DC

## 2016-09-16 MED ORDER — ACETAMINOPHEN 325 MG PO TABS: 650.0000 mg | ORAL_TABLET | ORAL | Status: DC | PRN

## 2016-09-16 MED ORDER — MIDAZOLAM HCL 2 MG/2ML IJ SOLN
INTRAMUSCULAR | Status: AC
Start: 2016-09-16 — End: 2016-09-16
  Filled 2016-09-16: qty 2

## 2016-09-16 MED ORDER — DEXAMETHASONE SODIUM PHOSPHATE 10 MG/ML IJ SOLN
INTRAMUSCULAR | Status: DC | PRN
  Administered 2016-09-16: 10 mg via INTRAVENOUS

## 2016-09-16 MED ORDER — ROPIVACAINE HCL 2 MG/ML IJ SOLN
INTRAMUSCULAR | Status: DC | PRN
  Administered 2016-09-16: 8 mL/h via EPIDURAL

## 2016-09-16 MED ORDER — PHENYLEPHRINE HCL 10 MG/ML IJ SOLN
INTRAVENOUS | Status: DC | PRN
  Administered 2016-09-16: 25 ug/min via INTRAVENOUS

## 2016-09-16 MED ORDER — LACTATED RINGERS IV SOLN
INTRAVENOUS | Status: DC | PRN
  Administered 2016-09-16 (×2): via INTRAVENOUS

## 2016-09-16 MED ORDER — CEFAZOLIN SODIUM-DEXTROSE 2-4 GM/100ML-% IV SOLN
2.0000 g | INTRAVENOUS | Status: AC
  Administered 2016-09-16: 2 g via INTRAVENOUS
  Filled 2016-09-16: qty 100

## 2016-09-16 MED ORDER — SUCCINYLCHOLINE CHLORIDE 200 MG/10ML IV SOSY
PREFILLED_SYRINGE | INTRAVENOUS | Status: AC
Start: 2016-09-16 — End: 2016-09-16
  Filled 2016-09-16: qty 10

## 2016-09-16 MED ORDER — HYDROMORPHONE HCL 1 MG/ML IJ SOLN: 0.5000 mg | INTRAMUSCULAR | Status: DC | PRN

## 2016-09-16 MED ORDER — ACETAMINOPHEN 650 MG RE SUPP: 650.0000 mg | RECTAL | Status: DC | PRN

## 2016-09-16 MED ORDER — ONDANSETRON HCL 4 MG/2ML IJ SOLN
INTRAMUSCULAR | Status: AC
  Filled 2016-09-16: qty 2

## 2016-09-16 MED ORDER — BUPIVACAINE-EPINEPHRINE (PF) 0.25% -1:200000 IJ SOLN
INTRAMUSCULAR | Status: DC | PRN
  Administered 2016-09-16: 10 mL

## 2016-09-16 MED ORDER — LIDOCAINE 2% (20 MG/ML) 5 ML SYRINGE
INTRAMUSCULAR | Status: AC
Start: 2016-09-16 — End: 2016-09-16
  Filled 2016-09-16: qty 5

## 2016-09-16 MED ORDER — ROPIVACAINE HCL 2 MG/ML IJ SOLN
8.0000 mL/h | INTRAMUSCULAR | Status: AC
  Administered 2016-09-16: 8 mL/h via EPIDURAL

## 2016-09-16 MED ORDER — PROPOFOL 10 MG/ML IV BOLUS
INTRAVENOUS | Status: AC
Start: 2016-09-16 — End: 2016-09-16
  Filled 2016-09-16: qty 20

## 2016-09-16 MED ORDER — FENTANYL CITRATE (PF) 250 MCG/5ML IJ SOLN
INTRAMUSCULAR | Status: AC
  Filled 2016-09-16: qty 5

## 2016-09-16 MED ORDER — GLYCOPYRROLATE 0.2 MG/ML IJ SOLN
INTRAMUSCULAR | Status: DC | PRN
  Administered 2016-09-16: .1 mg via INTRAVENOUS

## 2016-09-16 MED ORDER — BUPIVACAINE-EPINEPHRINE (PF) 0.25% -1:200000 IJ SOLN
INTRAMUSCULAR | Status: AC
  Filled 2016-09-16: qty 30

## 2016-09-16 MED ORDER — OXYCODONE HCL 5 MG PO TABS: 5.0000 mg | ORAL_TABLET | Freq: Four times a day (QID) | ORAL | 0 refills | Status: DC | PRN

## 2016-09-16 MED ORDER — CHLORHEXIDINE GLUCONATE CLOTH 2 % EX PADS: 6.0000 | MEDICATED_PAD | Freq: Once | CUTANEOUS | Status: DC

## 2016-09-16 MED ORDER — OXYCODONE HCL 5 MG PO TABS: 5.0000 mg | ORAL_TABLET | ORAL | Status: DC | PRN

## 2016-09-16 MED ORDER — ROCURONIUM BROMIDE 10 MG/ML (PF) SYRINGE
PREFILLED_SYRINGE | INTRAVENOUS | Status: AC
  Filled 2016-09-16: qty 5

## 2016-09-16 MED ORDER — 0.9 % SODIUM CHLORIDE (POUR BTL) OPTIME
TOPICAL | Status: DC | PRN
  Administered 2016-09-16: 1000 mL

## 2016-09-16 MED ORDER — LIDOCAINE-EPINEPHRINE (PF) 1.5 %-1:200000 IJ SOLN
INTRAMUSCULAR | Status: DC | PRN
  Administered 2016-09-16: 2 mL via EPIDURAL
  Administered 2016-09-16: 3 mL via EPIDURAL
  Administered 2016-09-16: 1 mL via EPIDURAL

## 2016-09-16 MED ORDER — SODIUM CHLORIDE 0.9 % IV SOLN: 250.0000 mL | INTRAVENOUS | Status: DC | PRN

## 2016-09-16 MED ORDER — ROCURONIUM BROMIDE 100 MG/10ML IV SOLN
INTRAVENOUS | Status: DC | PRN
  Administered 2016-09-16: 50 mg via INTRAVENOUS

## 2016-09-16 MED ORDER — ONDANSETRON HCL 4 MG/2ML IJ SOLN
INTRAMUSCULAR | Status: DC | PRN
  Administered 2016-09-16: 4 mg via INTRAVENOUS

## 2016-09-16 MED ORDER — LIDOCAINE HCL (PF) 1 % IJ SOLN
INTRAMUSCULAR | Status: AC
  Filled 2016-09-16: qty 30

## 2016-09-16 MED ORDER — SUGAMMADEX SODIUM 200 MG/2ML IV SOLN
INTRAVENOUS | Status: DC | PRN
  Administered 2016-09-16: 150 mg via INTRAVENOUS

## 2016-09-16 MED ORDER — PROPOFOL 10 MG/ML IV BOLUS
INTRAVENOUS | Status: DC | PRN
  Administered 2016-09-16: 70 mg via INTRAVENOUS

## 2016-09-16 MED ORDER — FENTANYL CITRATE (PF) 100 MCG/2ML IJ SOLN
INTRAMUSCULAR | Status: DC | PRN
  Administered 2016-09-16: 50 ug via INTRAVENOUS
  Administered 2016-09-16: 100 ug via INTRAVENOUS
  Administered 2016-09-16 (×2): 50 ug via INTRAVENOUS

## 2016-09-16 MED ORDER — PHENYLEPHRINE HCL 10 MG/ML IJ SOLN
INTRAMUSCULAR | Status: DC | PRN
  Administered 2016-09-16 (×2): 80 ug via INTRAVENOUS

## 2016-09-16 MED ORDER — MIDAZOLAM HCL 5 MG/5ML IJ SOLN
INTRAMUSCULAR | Status: DC | PRN
  Administered 2016-09-16 (×2): 1 mg via INTRAVENOUS

## 2016-09-16 MED ORDER — HYDROMORPHONE HCL 1 MG/ML IJ SOLN
INTRAMUSCULAR | Status: AC
  Administered 2016-09-16: 0.5 mg
  Filled 2016-09-16: qty 0.5

## 2016-09-16 SURGICAL SUPPLY — 113 items
BAG BILE T-TUBES STRL (MISCELLANEOUS) IMPLANT
BLADE CLIPPER SURG (BLADE) IMPLANT
BOOT SUTURE AID YELLOW STND (SUTURE) IMPLANT
CANISTER SUCT 3000ML PPV (MISCELLANEOUS) ×4 IMPLANT
CATH KIT ON Q 7.5IN SLV (PAIN MANAGEMENT) IMPLANT
CATH ROBINSON RED A/P 16FR (CATHETERS) IMPLANT
CHLORAPREP W/TINT 26ML (MISCELLANEOUS) ×4 IMPLANT
CLIP LIGATING HEM O LOK PURPLE (MISCELLANEOUS) ×4 IMPLANT
CLIP LIGATING HEMO O LOK GREEN (MISCELLANEOUS) ×4 IMPLANT
CLIP LIGATING HEMOLOK MED (MISCELLANEOUS) ×4 IMPLANT
CLIP TI LARGE 6 (CLIP) ×4 IMPLANT
CLIP TI MEDIUM 24 (CLIP) ×4 IMPLANT
CONT SPEC 4OZ CLIKSEAL STRL BL (MISCELLANEOUS) ×4 IMPLANT
COVER MAYO STAND STRL (DRAPES) ×4 IMPLANT
COVER SURGICAL LIGHT HANDLE (MISCELLANEOUS) ×4 IMPLANT
DECANTER SPIKE VIAL GLASS SM (MISCELLANEOUS) ×8 IMPLANT
DERMABOND ADVANCED (GAUZE/BANDAGES/DRESSINGS) ×2
DERMABOND ADVANCED .7 DNX12 (GAUZE/BANDAGES/DRESSINGS) ×2 IMPLANT
DRAIN CHANNEL 19F RND (DRAIN) IMPLANT
DRAIN PENROSE 1/2X36 STERILE (WOUND CARE) IMPLANT
DRAPE LAPAROSCOPIC ABDOMINAL (DRAPES) ×4 IMPLANT
DRAPE WARM FLUID 44X44 (DRAPE) ×4 IMPLANT
DRESSING TELFA 8X10 (GAUZE/BANDAGES/DRESSINGS) ×4 IMPLANT
DRSG COVADERM 4X10 (GAUZE/BANDAGES/DRESSINGS) IMPLANT
DRSG COVADERM 4X14 (GAUZE/BANDAGES/DRESSINGS) IMPLANT
DRSG COVADERM 4X6 (GAUZE/BANDAGES/DRESSINGS) IMPLANT
DRSG COVADERM 4X8 (GAUZE/BANDAGES/DRESSINGS) IMPLANT
DRSG TELFA 3X8 NADH (GAUZE/BANDAGES/DRESSINGS) IMPLANT
ELECT BLADE 4.0 EZ CLEAN MEGAD (MISCELLANEOUS) ×4
ELECT BLADE 6.5 EXT (BLADE) ×4 IMPLANT
ELECT CAUTERY BLADE 6.4 (BLADE) ×4 IMPLANT
ELECT REM PT RETURN 9FT ADLT (ELECTROSURGICAL) ×4
ELECTRODE BLDE 4.0 EZ CLN MEGD (MISCELLANEOUS) ×2 IMPLANT
ELECTRODE REM PT RTRN 9FT ADLT (ELECTROSURGICAL) ×2 IMPLANT
GAUZE SPONGE 4X4 12PLY STRL (GAUZE/BANDAGES/DRESSINGS) IMPLANT
GAUZE SPONGE 4X4 16PLY XRAY LF (GAUZE/BANDAGES/DRESSINGS) IMPLANT
GEL ULTRASOUND 20GR AQUASONIC (MISCELLANEOUS) ×4 IMPLANT
GLOVE BIO SURGEON STRL SZ 6 (GLOVE) ×8 IMPLANT
GLOVE BIO SURGEON STRL SZ7.5 (GLOVE) IMPLANT
GLOVE BIOGEL PI IND STRL 6.5 (GLOVE) ×4 IMPLANT
GLOVE BIOGEL PI IND STRL 8 (GLOVE) IMPLANT
GLOVE BIOGEL PI INDICATOR 6.5 (GLOVE) ×4
GLOVE BIOGEL PI INDICATOR 8 (GLOVE)
GLOVE INDICATOR 6.5 STRL GRN (GLOVE) IMPLANT
GOWN STRL REUS W/ TWL LRG LVL3 (GOWN DISPOSABLE) ×2 IMPLANT
GOWN STRL REUS W/ TWL XL LVL3 (GOWN DISPOSABLE) IMPLANT
GOWN STRL REUS W/TWL 2XL LVL3 (GOWN DISPOSABLE) ×8 IMPLANT
GOWN STRL REUS W/TWL LRG LVL3 (GOWN DISPOSABLE) ×2
GOWN STRL REUS W/TWL XL LVL3 (GOWN DISPOSABLE)
HEMOSTAT SURGICEL 2X14 (HEMOSTASIS) IMPLANT
KIT BASIN OR (CUSTOM PROCEDURE TRAY) ×4 IMPLANT
KIT MARKER MARGIN INK (KITS) IMPLANT
KIT ROOM TURNOVER OR (KITS) ×4 IMPLANT
KIT TOURNIQUET VASCULAR (KITS) IMPLANT
L-HOOK LAP DISP 36CM (ELECTROSURGICAL) ×4
LHOOK LAP DISP 36CM (ELECTROSURGICAL) ×2 IMPLANT
LOOP VESSEL MAXI BLUE (MISCELLANEOUS) ×4 IMPLANT
NEEDLE BIOPSY 14X6 SOFT TISS (NEEDLE) IMPLANT
NS IRRIG 1000ML POUR BTL (IV SOLUTION) ×8 IMPLANT
PACK GENERAL/GYN (CUSTOM PROCEDURE TRAY) ×4 IMPLANT
PAD ARMBOARD 7.5X6 YLW CONV (MISCELLANEOUS) ×4 IMPLANT
PAD SHARPS MAGNETIC DISPOSAL (MISCELLANEOUS) IMPLANT
PENCIL BUTTON HOLSTER BLD 10FT (ELECTRODE) IMPLANT
PLUG CATH AND CAP STER (CATHETERS) IMPLANT
RELOAD PROXIMATE 75MM BLUE (ENDOMECHANICALS) IMPLANT
RELOAD PROXIMATE 75MM GREEN (ENDOMECHANICALS) IMPLANT
SCISSORS LAP 5X35 DISP (ENDOMECHANICALS) IMPLANT
SEPRAFILM PROCEDURAL PACK 3X5 (MISCELLANEOUS) IMPLANT
SET IRRIG TUBING LAPAROSCOPIC (IRRIGATION / IRRIGATOR) IMPLANT
SHEARS FOC LG CVD HARMONIC 17C (MISCELLANEOUS) IMPLANT
SLEEVE ENDOPATH XCEL 5M (ENDOMECHANICALS) ×4 IMPLANT
SPONGE INTESTINAL PEANUT (DISPOSABLE) IMPLANT
SPONGE LAP 18X18 X RAY DECT (DISPOSABLE) IMPLANT
SPONGE SURGIFOAM ABS GEL 100 (HEMOSTASIS) IMPLANT
STAPLER PROXIMATE 75MM BLUE (STAPLE) IMPLANT
STAPLER VISISTAT 35W (STAPLE) IMPLANT
SUCTION POOLE TIP (SUCTIONS) IMPLANT
SUT 5.0 PDS RB-1 (SUTURE)
SUT ETHILON 2 0 FS 18 (SUTURE) IMPLANT
SUT ETHILON 2 LR (SUTURE) IMPLANT
SUT MNCRL AB 4-0 PS2 18 (SUTURE) ×4 IMPLANT
SUT PDS AB 1 TP1 96 (SUTURE) IMPLANT
SUT PDS AB 3-0 SH 27 (SUTURE) ×4 IMPLANT
SUT PDS AB 4-0 RB1 27 (SUTURE) ×4 IMPLANT
SUT PDS PLUS AB 5-0 RB-1 (SUTURE) IMPLANT
SUT PROLENE 3 0 SH 48 (SUTURE) IMPLANT
SUT PROLENE 4 0 RB 1 (SUTURE)
SUT PROLENE 4-0 RB1 .5 CRCL 36 (SUTURE) IMPLANT
SUT PROLENE 5 0 RB 1 DA (SUTURE) IMPLANT
SUT SILK 2 0 SH CR/8 (SUTURE) ×4 IMPLANT
SUT SILK 2 0 TIES 10X30 (SUTURE) ×4 IMPLANT
SUT SILK 3 0 SH CR/8 (SUTURE) ×4 IMPLANT
SUT SILK 3 0 TIES 10X30 (SUTURE) ×4 IMPLANT
SUT VIC AB 2-0 CT1 27 (SUTURE)
SUT VIC AB 2-0 CT1 TAPERPNT 27 (SUTURE) IMPLANT
SUT VIC AB 2-0 SH 18 (SUTURE) IMPLANT
SUT VIC AB 3-0 SH 18 (SUTURE) IMPLANT
SUT VIC AB 3-0 SH 27 (SUTURE)
SUT VIC AB 3-0 SH 27X BRD (SUTURE) IMPLANT
SUT VICRYL AB 2 0 TIES (SUTURE) IMPLANT
TAPE UMBILICAL 1/8 X36 TWILL (MISCELLANEOUS) IMPLANT
TOWEL OR 17X24 6PK STRL BLUE (TOWEL DISPOSABLE) IMPLANT
TOWEL OR 17X26 10 PK STRL BLUE (TOWEL DISPOSABLE) ×4 IMPLANT
TRAY FOLEY W/METER SILVER 14FR (SET/KITS/TRAYS/PACK) ×4 IMPLANT
TRAY LAPAROSCOPIC MC (CUSTOM PROCEDURE TRAY) IMPLANT
TROCAR XCEL BLUNT TIP 100MML (ENDOMECHANICALS) IMPLANT
TROCAR XCEL NON-BLD 11X100MML (ENDOMECHANICALS) IMPLANT
TROCAR XCEL NON-BLD 5MMX100MML (ENDOMECHANICALS) ×4 IMPLANT
TUBE FEEDING 8FR 16IN STR KANG (MISCELLANEOUS) IMPLANT
TUBE FEEDING ENTERAL 5FR 16IN (TUBING) IMPLANT
TUBING INSUFFLATION (TUBING) ×4 IMPLANT
TUNNELER SHEATH ON-Q 16GX12 DP (PAIN MANAGEMENT) IMPLANT
WATER STERILE IRR 1000ML POUR (IV SOLUTION) IMPLANT

## 2016-09-16 NOTE — Transfer of Care (Signed)
Immediate Anesthesia Transfer of Care Note  Patient: Benjamin Diaz.  Procedure(s) Performed: Procedure(s): DIAGNOSTIC LAPAROSCOPIC LIVER BIOPSY  Patient Location: PACU  Anesthesia Type:General  Level of Consciousness: awake, oriented and patient cooperative  Airway & Oxygen Therapy: Patient Spontanous Breathing and Patient connected to nasal cannula oxygen  Post-op Assessment: Report given to RN and Post -op Vital signs reviewed and stable  Post vital signs: Reviewed  Last Vitals:  Vitals:   09/16/16 0637 09/16/16 1011  BP: 135/79   Resp: 18   Temp: 36.4 C (P) 36.4 C    Last Pain: There were no vitals filed for this visit.    Patients Stated Pain Goal: 2 (26/41/58 3094)  Complications: No apparent anesthesia complications

## 2016-09-16 NOTE — Telephone Encounter (Signed)
  Oncology Nurse Navigator Documentation Received call from Dr. Barry Dienes for referral regarding metastatic pancreatic cancer. Appointment has been arranged for 09/21/16 at 2:15pm at the [facility] in [place]. Voicemail left for Mr. Schnitker to return call for appointment notification. Navigator Location: [facility] (09/16/16 1500) Referral date to RadOnc/MedOnc: 09/16/16 (09/16/16 1500) )Navigator Encounter Type: Telephone (09/16/16 1500) Telephone: Lahoma Crocker Call;Appt Confirmation/Clarification (09/16/16 1500)                                                  Time Spent with Patient: 15 (09/16/16 1500)

## 2016-09-16 NOTE — Interval H&P Note (Signed)
History and Physical Interval Note:  09/16/2016 7:30 AM  Benjamin Diaz.  has presented today for surgery, with the diagnosis of pancreatic head mass  The various methods of treatment have been discussed with the patient and family. After consideration of risks, benefits and other options for treatment, the patient has consented to  Procedure(s): LAPAROSCOPY DIAGNOSTIC (N/A) WHIPPLE PROCEDURE (N/A) as a surgical intervention .  Labs improved dramatically, so plan surgery. Will also consider J tube.   The patient's history has been reviewed, patient examined, no change in status, stable for surgery.  I have reviewed the patient's chart and labs.  Questions were answered to the patient's satisfaction.     Avier Jech

## 2016-09-16 NOTE — Anesthesia Procedure Notes (Signed)
Procedure Name: Intubation Date/Time: 09/16/2016 8:22 AM Performed by: Jenne Campus Pre-anesthesia Checklist: Patient identified, Emergency Drugs available, Suction available and Patient being monitored Patient Re-evaluated:Patient Re-evaluated prior to inductionOxygen Delivery Method: Circle System Utilized Preoxygenation: Pre-oxygenation with 100% oxygen Intubation Type: IV induction Ventilation: Mask ventilation without difficulty Laryngoscope Size: Mac and 4 Grade View: Grade I Tube type: Oral Tube size: 7.5 mm Number of attempts: 1 Airway Equipment and Method: Stylet and Oral airway Placement Confirmation: ETT inserted through vocal cords under direct vision,  positive ETCO2 and breath sounds checked- equal and bilateral Secured at: 23 cm Tube secured with: Tape Dental Injury: Teeth and Oropharynx as per pre-operative assessment

## 2016-09-16 NOTE — Anesthesia Preprocedure Evaluation (Addendum)
Anesthesia Evaluation  Patient identified by MRN, date of birth, ID band Patient awake    Reviewed: Allergy & Precautions, NPO status , Patient's Chart, lab work & pertinent test results  History of Anesthesia Complications Negative for: history of anesthetic complications  Airway Mallampati: II  TM Distance: >3 FB Neck ROM: Full    Dental  (+) Poor Dentition, Dental Advisory Given   Pulmonary asthma , COPD,  COPD inhaler, Current Smoker,    + rhonchi        Cardiovascular hypertension, Pt. on medications (-) angina(-) Past MI and (-) CHF + dysrhythmias Atrial Fibrillation  Rhythm:Regular Rate:Normal     Neuro/Psych  Neuromuscular disease    GI/Hepatic Neg liver ROS, Pancreatic ca   Endo/Other  negative endocrine ROS  Renal/GU      Musculoskeletal  (+) Arthritis ,   Abdominal   Peds  Hematology  (+) anemia ,   Anesthesia Other Findings Left leg sciatica.  Reproductive/Obstetrics                           Anesthesia Physical Anesthesia Plan  ASA: III  Anesthesia Plan: General and Epidural   Post-op Pain Management:    Induction: Intravenous  PONV Risk Score and Plan: 1 and Ondansetron and Dexamethasone  Airway Management Planned: Oral ETT  Additional Equipment: Arterial line  Intra-op Plan:   Post-operative Plan: Extubation in OR and Possible Post-op intubation/ventilation  Informed Consent: I have reviewed the patients History and Physical, chart, labs and discussed the procedure including the risks, benefits and alternatives for the proposed anesthesia with the patient or authorized representative who has indicated his/her understanding and acceptance.   Dental advisory given  Plan Discussed with: CRNA and Surgeon  Anesthesia Plan Comments:         Anesthesia Quick Evaluation

## 2016-09-16 NOTE — Op Note (Signed)
PRE-OPERATIVE DIAGNOSIS: adenocarcinoma of the pancreatic head, cT1N0M0  POST-OPERATIVE DIAGNOSIS:  Adenocarcinoma of the pancreatic head, [id number]  PROCEDURE:  Procedure(s): Diagnostic laparoscopy, liver biopsy  SURGEON:  Surgeon(s): [name], MD  ASSIST:   Leighton Ruff, MD  ANESTHESIA:   local and general  DRAINS: none   LOCAL MEDICATIONS USED:  BUPIVICAINE  and LIDOCAINE   SPECIMEN:  Source of Specimen:  liver biopsy  DISPOSITION OF SPECIMEN:  PATHOLOGY  COUNTS:  YES  DICTATION: .Dragon Dictation  PLAN OF CARE: Discharge to home after PACU  PATIENT DISPOSITION:  PACU - hemodynamically stable.  FINDINGS:  Many small nodules on the surface of the right liver.  EBL: min  PROCEDURE:  Pt was identified in the holding area and taken to the OR where he was placed supine on the OR table.  General anesthesia was induced.  The abdomen was prepped and draped in sterile fashion. Timeout was performed according to the surgical safety checklist.  When all was correct, we continued.    The patient was placed into reverse trendelenburg position and rotated to the right.  The left costal margin was anesthetized and a 6 mm incision was made.  The 5 mm Optiview trocar was placed under direct visualization.  Pneumoperitoneum was achieved to a pressure of 15 mm Hg.  The abdomen was examined visually.  The right lower liver had some suspicious lesions.  A second 5 mm port was placed in the midline.  The liver biopsy was performed with biopsy forceps through the abdominal wall on the right abdomen.  This was sent for frozen.  Hemostasis was obtained with the cautery.  The liver was irrigated.    The frozen returned positive for adenocarcinoma.  The abdomen was reinspected for hemostasis.  Pneumoperitoneum was released.  The incisions were closed with 4-0 monocryl.  They were cleaned, dried, and dressed with dermabond.  The patient was allowed to emerge from anesthesia and taken to PACU in  stable condition.  Needle, sponge, and instrument counts were correct.

## 2016-09-16 NOTE — Discharge Instructions (Addendum)
[place] Office Phone Number [phone]   POST OP INSTRUCTIONS  Always review your discharge instruction sheet given to you by the facility where your surgery was performed.  IF YOU HAVE DISABILITY OR FAMILY LEAVE FORMS, YOU MUST BRING THEM TO THE OFFICE FOR PROCESSING.  DO NOT GIVE THEM TO YOUR DOCTOR.  1. A prescription for pain medication may be given to you upon discharge.  Take your pain medication as prescribed, if needed.  If narcotic pain medicine is not needed, then you may take acetaminophen (Tylenol) or ibuprofen (Advil) as needed. 2. Take your usually prescribed medications unless otherwise directed 3. If you need a refill on your pain medication, please contact your pharmacy.  They will contact our office to request authorization.  Prescriptions will not be filled after 5pm or on week-ends. 4. You should eat very light the first 24 hours after surgery, such as soup, crackers, pudding, etc.  Resume your normal diet the day after surgery 5. It is common to experience some constipation if taking pain medication after surgery.  Increasing fluid intake and taking a stool softener will usually help or prevent this problem from occurring.  A mild laxative (Milk of Magnesia or Miralax) should be taken according to package directions if there are no bowel movements after 48 hours. 6. You may shower in 48 hours.  The surgical glue will flake off in 2-3 weeks.   7. ACTIVITIES:  No strenuous activity or heavy lifting for 1 week.   a. You may drive when you no longer are taking prescription pain medication, you can comfortably wear a seatbelt, and you can safely maneuver your car and apply brakes. b. RETURN TO WORK:  __________as tolerated, but approximately 1 week._______________ You should see your doctor in the office for a follow-up appointment approximately three-four weeks after your surgery.    WHEN TO CALL YOUR DOCTOR: 1. Fever over 101.0 2. Nausea and/or  vomiting. 3. Extreme swelling or bruising. 4. Continued bleeding from incision. 5. Increased pain, redness, or drainage from the incision.  The clinic staff is available to answer your questions during regular business hours.  Please dont hesitate to call and ask to speak to one of the nurses for clinical concerns.  If you have a medical emergency, go to the nearest emergency room or call 911.  A surgeon from [facility] is always on call at the hospital.  For further questions, please visit [email or web address]

## 2016-09-16 NOTE — H&P (Signed)
Benjamin Diaz  Location: [place] Office Patient #: [id number] DOB: [birth year] Single / Language: Benjamin Diaz / Race: White Male   History of Present Illness The patient is a 56 year old male who presents for a follow-up for Pancreatic cancer. The patient is a 56 year old male referred for consultation by Dr. Carles Collet for a new diagnosis of malignant pancreatic mass in the uncinate process April 2018. Patient presented to the hospital with itching and jaundice. His bilirubin was 34. He had been aliments that was transferred to [facility] for ERCP and other workup. [name], HE DID GET IMAGING DEMONSTRATING A 1.9 CM MASS IN THE HEAD OF THE PANCREAS, SPECIFICALLY THE UNCINATE PROCESS. A PLASTIC STENT WAS PLACED IN THE COMMON BILE DUCT BY DR. Amedeo Plenty ON ERCP. CYTOLOGY SHOWED ATYPICAL CELLS. CHEST CT WAS PERFORMED WHICH WAS NEGATIVE FOR METASTATIC DISEASE. He also had hyponatremia and hypokalemia. He had discussions regarding smoking cessation. He does have asthma and COPD. He also is reported to have malnutrition.  We had him scheduled for surgery next week. However, his bilirubin was 18.8 on pre op labs, sodium was 126, Cl 85, and albumin 2.4. I contacted Dr. Paulita Fujita after his last visit because bilirubin was 14. Dr. Paulita Fujita was planning repeat ERCP, but the office was unable to contact him.   I canceled his surgery for next week due to his labs. He is here to discuss. He continues to have nausea and throws up anything solid that he tried to eat. He has been eating eggs and milk as well as 2-3 ensure/boost per day. His weight today is 117.2 and was 118 at last visit in [place].     Problem List/Past Medical ADENOCARCINOMA OF HEAD OF PANCREAS (C25.0)  MALNUTRITION (E46)   Past Surgical History Knee Surgery  Right. Shoulder Surgery  Right.  Diagnostic Studies History Colonoscopy  never  Allergies No Known Drug Allergies 07/19/2016  Medication History Ensure Enlive  (Oral) Active. Proventil HFA (108 (90 Base)MCG/ACT Aerosol Soln, Inhalation) Active. Symbicort (160-4.5MCG/ACT Aerosol, Inhalation) Active. Medications Reconciled  Social History Alcohol use  Moderate alcohol use. Caffeine use  Coffee. No drug use  Tobacco use  Current every day smoker.  Family History Breast Cancer  Father, Mother. Heart Disease  Father.  Other Problems Asthma  Back Pain  Chronic Obstructive Lung Disease  TOBACCO ABUSE COUNSELING (Z71.6)     Review of Systems All other systems negative  Vitals Weight: 117.13 lb Height: 72in Body Surface Area: 1.7 m Body Mass Index: 15.88 kg/m  Temp.: 99.58F(Oral)  Pulse: 96 (Regular)  BP: 106/68 (Sitting, Left Arm, Standard)       Physical Exam General Mental Status-Alert. General Appearance-Consistent with stated age. Hydration-Well hydrated. Voice-Normal.  Integumentary Note: icteric   Eye Note: + scleral icterus   Chest and Lung Exam Chest and lung exam reveals -quiet, even and easy respiratory effort with no use of accessory muscles. Inspection Chest Wall - Normal. Back - normal.  Abdomen Note: non distended. scaphoid.   Neurologic Neurologic evaluation reveals -alert and oriented x 3 with no impairment of recent or remote memory. Mental Status-Normal.  Musculoskeletal Global Assessment -Note: no gross deformities.  Normal Exam - Left-Upper Extremity Strength Normal and Lower Extremity Strength Normal. Normal Exam - Right-Upper Extremity Strength Normal and Lower Extremity Strength Normal.    Assessment & Plan  ADENOCARCINOMA OF HEAD OF PANCREAS (C25.0) Impression: Hopefully pt can get ERCP/EUS and get cellular dx.   I advised him to let us know when his procedure  is so we can get labs 3-7 days later. Current Plans Pt Education - [employer] Free Text Education/Instructions: discussed with patient and provided information. MALNUTRITION  (E46) Impression: I contacted nutrition and will get him set up to see them at the cancer center in [place]. 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 HYPONATREMIA (E87.1) Impression: Discussed increasing fluid intake to avoid dehydration.    Signed by [name], MD

## 2016-09-17 ENCOUNTER — Encounter (HOSPITAL_COMMUNITY): Payer: Self-pay | Admitting: General Surgery

## 2016-09-20 DIAGNOSIS — C259 Malignant neoplasm of pancreas, unspecified: Secondary | ICD-10-CM | POA: Insufficient documentation

## 2016-09-20 NOTE — Progress Notes (Signed)
Benjamin Diaz  Telephone:([phone] [phone]  ID: Benjamin Diaz. OB: 05/04/1960  MR#: [id number]  GHW#:[id number]  Patient Care Team: Patient, [name] as PCP - General (General Practice) [name], MD as Consulting Physician (Oncology) [name], MD as Consulting Physician (Gastroenterology)  CHIEF COMPLAINT: Stage IV pancreatic cancer  INTERVAL HISTORY: Patient is a 56 year old male who initially presented with painless jaundice and found to have a pancreatic mass. He underwent surgery, but was then found to have widespread intra-abdominal metastatic disease. He has a significant amount of weight loss. He has weakness and fatigue. He has no neurologic complaints. He denies any fevers. He has a poor appetite. He has no chest pain or shortness of breath. He denies any abdominal pain he has no nausea, vomiting, constipation, or diarrhea. He has no urinary complaints. Patient offers no further specific complaints today.  REVIEW OF SYSTEMS:   Review of Systems  Constitutional: Negative.  Negative for fever, malaise/fatigue and weight loss.  Respiratory: Negative.  Negative for cough and shortness of breath.   Cardiovascular: Negative.  Negative for chest pain and leg swelling.  Gastrointestinal: Negative.  Negative for abdominal pain.  Genitourinary: Negative.   Musculoskeletal: Negative.   Skin: Negative.  Negative for rash.  Neurological: Negative.  Negative for sensory change and weakness.  Psychiatric/Behavioral: Negative.  The patient is not nervous/anxious.     As per HPI. Otherwise, a complete review of systems is negative.  PAST MEDICAL HISTORY: Past Medical History:  Diagnosis Date  . Arthritis   . Asthma   . Atrial fibrillation ([place])   . Closed left ankle fracture age 11  . COPD (chronic obstructive pulmonary disease) ([place])   . Dysrhythmia   . Pneumonia 2011  . Sciatica    left leg pinched nerve numb at times about once a  year    PAST SURGICAL HISTORY: Past Surgical History:  Procedure Laterality Date  . BILIARY STENT PLACEMENT N/A 09/01/2016   Procedure: BILIARY STENT PLACEMENT;  Surgeon: [name], MD;  Location: [facility];  Service: Endoscopy;  Laterality: N/A;  . DIAGNOSTIC LAPAROSCOPIC LIVER BIOPSY  09/16/2016   Procedure: DIAGNOSTIC LAPAROSCOPIC LIVER BIOPSY;  Surgeon: [name], MD;  Location: [place];  Service: General;;  . ERCP N/A 07/09/2016   Procedure: ENDOSCOPIC RETROGRADE CHOLANGIOPANCREATOGRAPHY (ERCP);  Surgeon: [name], MD;  Location: [facility];  Service: Endoscopy;  Laterality: N/A;  . ERCP N/A 09/01/2016   Procedure: ENDOSCOPIC RETROGRADE CHOLANGIOPANCREATOGRAPHY (ERCP);  Surgeon: [name], MD;  Location: [facility];  Service: Endoscopy;  Laterality: N/A;  . ESOPHAGOGASTRODUODENOSCOPY (EGD) WITH PROPOFOL N/A 09/01/2016   Procedure: ESOPHAGOGASTRODUODENOSCOPY (EGD) WITH PROPOFOL;  Surgeon: [name], MD;  Location: [facility];  Service: Gastroenterology;  Laterality: N/A;  . EUS N/A 09/01/2016   Procedure: ESOPHAGEAL ENDOSCOPIC ULTRASOUND (EUS) RADIAL;  Surgeon: [name], MD;  Location: [facility];  Service: Endoscopy;  Laterality: N/A;  . FINE NEEDLE ASPIRATION N/A 09/01/2016   Procedure: FINE NEEDLE ASPIRATION (FNA) RADIAL;  Surgeon: [name], MD;  Location: [facility];  Service: Endoscopy;  Laterality: N/A;  . FOREIGN BODY REMOVAL N/A 09/01/2016   Procedure: FOREIGN BODY REMOVAL;  Surgeon: [name], MD;  Location: [facility];  Service: Gastroenterology;  Laterality: N/A;  . KNEE SURGERY Right 2002   arthroscopy  . SHOULDER SURGERY Right 2002    FAMILY HISTORY: Family History  Problem Relation Age of Onset  . Heart disease Father     ADVANCED DIRECTIVES (Y/N):  N  HEALTH MAINTENANCE: Social History  Substance Use Topics  . Smoking status: Current Every Day Smoker    Packs/day: 0.50    Years: 40.00    Types: Cigarettes  . Smokeless  tobacco: Never Used  . Alcohol use 4.2 oz/week    7 Cans of beer per week     Comment: last drink 5 weeeks ago  april 2018     Colonoscopy:  PAP:  Bone density:  Lipid panel:  Allergies  Allergen Reactions  . No Known Allergies     Current Outpatient Prescriptions  Medication Sig Dispense Refill  . feeding supplement, ENSURE ENLIVE, (ENSURE ENLIVE) LIQD Take 237 mLs by mouth 3 (three) times daily between meals. 90 Bottle 0  . Ipratropium-Albuterol (COMBIVENT RESPIMAT) 20-100 MCG/ACT AERS respimat Inhale 1 puff into the lungs 4 (four) times daily.    Marland Kitchen gabapentin (NEURONTIN) 300 MG capsule Take 1 capsule (300 mg total) by mouth at bedtime. (Patient not taking: Reported on 07/29/2016) 30 capsule 3  . Oxycodone HCl 10 MG TABS Take 1 tablet (10 mg total) by mouth every 6 (six) hours as needed. 60 tablet 0   No current facility-administered medications for this visit.     OBJECTIVE: Vitals:   09/21/16 1506  BP: 120/80  Pulse: 86  Resp: 20  Temp: 97.2 F (36.2 C)     Body mass index is 17.16 kg/m.    ECOG FS:2 - Symptomatic, <50% confined to bed  General: Thin, no acute distress. Eyes: Pink conjunctiva, anicteric sclera. HEENT: Normocephalic, moist mucous membranes, clear oropharnyx. Lungs: Clear to auscultation bilaterally. Heart: Regular rate and rhythm. No rubs, murmurs, or gallops. Abdomen: Soft, nontender, nondistended. No organomegaly noted, normoactive bowel sounds. Musculoskeletal: No edema, cyanosis, or clubbing. Neuro: Alert, answering all questions appropriately. Cranial nerves grossly intact. Skin: No rashes or petechiae noted. Psych: Normal affect. Lymphatics: No cervical, calvicular, axillary or inguinal LAD.   LAB RESULTS:  Lab Results  Component Value Date   NA 130 (L) 09/21/2016   K 4.3 09/21/2016   CL 92 (L) 09/21/2016   CO2 28 09/21/2016   GLUCOSE 114 (H) 09/21/2016   BUN 13 09/21/2016   CREATININE 0.41 (L) 09/21/2016   CALCIUM 9.6 09/21/2016    PROT 8.2 (H) 09/21/2016   ALBUMIN 3.5 09/21/2016   AST 122 (H) 09/21/2016   ALT 142 (H) 09/21/2016   ALKPHOS 1,136 (H) 09/21/2016   BILITOT 2.2 (H) 09/21/2016   GFRNONAA >60 09/21/2016   GFRAA >60 09/21/2016    Lab Results  Component Value Date   WBC 13.4 (H) 09/21/2016   NEUTROABS 9.3 (H) 09/21/2016   HGB 14.0 09/21/2016   HCT 40.0 09/21/2016   MCV 89.2 09/21/2016   PLT 579 (H) 09/21/2016   Lab Results  Component Value Date   CA199 201 (H) 09/21/2016     STUDIES: Dg Ercp  Result Date: 09/01/2016 CLINICAL DATA:  CBD obstruction and mass of pancreatic head. EXAM: ERCP TECHNIQUE: Multiple spot images obtained with the fluoroscopic device and submitted for interpretation post-procedure. COMPARISON:  07/09/2016 FINDINGS: Plastic endoscopic biliary stent was removed. Imaging demonstrates severe stricture of the mid to distal common bile duct. The covered metallic biliary stent was placed spanning across the stricture. IMPRESSION: Removal of plastic endoscopic stent and placement of covered metallic biliary stent across persistent severe stricture of the common bile duct. These images were submitted for radiologic interpretation only. Please see the procedural report for the amount of contrast and the fluoroscopy time utilized. Electronically Signed  By: Aletta Edouard M.D.   On: 09/01/2016 14:09   Dg Abd Portable 1v  Result Date: 09/01/2016 CLINICAL DATA:  Possible foreign body EXAM: PORTABLE ABDOMEN - 1 VIEW COMPARISON:  None. FINDINGS: Scattered large small bowel gas is noted. A biliary tree stent is noted. A the radiopaque foreign body is noted within the midportion of the stomach consistent with the given clinical history of swallowed partial dental implant. No other focal abnormality is noted. IMPRESSION: Foreign body in the stomach consistent with the given clinical history. Electronically Signed   By: Inez Catalina M.D.   On: 09/01/2016 14:52    ASSESSMENT: Stage IV pancreatic  cancer  PLAN:   1. Stage IV pancreatic cancer: Imaging, pathology, and up note reviewed independently confirming stage IV disease with multiple peritoneal implants. Patient's CA-19-9 is also mildly elevated at 201. Patient wishes to proceed with palliative chemotherapy using gemcitabine and Abraxane. Abraxane will have to be used with caution given his elevated bilirubin and liver enzymes. Prior to initiating treatment, patient will have a PET scan for initial staging as well as port placement. Patient will return to clinic on September 28, 2016 to initiate cycle 1, day 1 of gemcitabine and Abraxane. 2. Pain: Patient was given a prescription for oxycodone today. 3. Elevated liver enzymes: Secondary to malignancy, monitor.  Approximately 60 minutes was spent in discussion of which greater than 50% was consultation.  Patient expressed understanding and was in agreement with this plan. He also understands that He can call clinic at any time with any questions, concerns, or complaints.   Cancer Staging Pancreatic cancer [facility]) Staging form: Exocrine Pancreas, AJCC 8th Edition - Clinical stage from 09/25/2016: Stage IV (cTX, cNX, pM1) - Signed by Lloyd Huger, MD on 09/25/2016   Lloyd Huger, MD   09/25/2016 8:14 AM

## 2016-09-21 ENCOUNTER — Inpatient Hospital Stay: Payer: BLUE CROSS/BLUE SHIELD

## 2016-09-21 ENCOUNTER — Encounter (HOSPITAL_COMMUNITY): Payer: Self-pay | Admitting: General Surgery

## 2016-09-21 ENCOUNTER — Telehealth (INDEPENDENT_AMBULATORY_CARE_PROVIDER_SITE_OTHER): Payer: Self-pay

## 2016-09-21 ENCOUNTER — Inpatient Hospital Stay: Payer: BLUE CROSS/BLUE SHIELD | Attending: Oncology | Admitting: Oncology

## 2016-09-21 VITALS — BP 120/80 | HR 86 | Temp 97.2°F | Resp 20 | Wt 126.5 lb

## 2016-09-21 DIAGNOSIS — F17210 Nicotine dependence, cigarettes, uncomplicated: Secondary | ICD-10-CM

## 2016-09-21 DIAGNOSIS — R531 Weakness: Secondary | ICD-10-CM

## 2016-09-21 DIAGNOSIS — C250 Malignant neoplasm of head of pancreas: Secondary | ICD-10-CM | POA: Insufficient documentation

## 2016-09-21 DIAGNOSIS — I4891 Unspecified atrial fibrillation: Secondary | ICD-10-CM | POA: Insufficient documentation

## 2016-09-21 DIAGNOSIS — R5383 Other fatigue: Secondary | ICD-10-CM

## 2016-09-21 DIAGNOSIS — R634 Abnormal weight loss: Secondary | ICD-10-CM | POA: Diagnosis not present

## 2016-09-21 DIAGNOSIS — J449 Chronic obstructive pulmonary disease, unspecified: Secondary | ICD-10-CM | POA: Diagnosis not present

## 2016-09-21 DIAGNOSIS — R748 Abnormal levels of other serum enzymes: Secondary | ICD-10-CM | POA: Insufficient documentation

## 2016-09-21 DIAGNOSIS — Z7189 Other specified counseling: Secondary | ICD-10-CM

## 2016-09-21 DIAGNOSIS — C259 Malignant neoplasm of pancreas, unspecified: Secondary | ICD-10-CM

## 2016-09-21 DIAGNOSIS — Z79899 Other long term (current) drug therapy: Secondary | ICD-10-CM | POA: Diagnosis not present

## 2016-09-21 LAB — COMPREHENSIVE METABOLIC PANEL
ALBUMIN: 3.5 g/dL (ref 3.5–5.0)
ALT: 142 U/L — ABNORMAL HIGH (ref 17–63)
ANION GAP: 10 (ref 5–15)
AST: 122 U/L — AB (ref 15–41)
Alkaline Phosphatase: 1136 U/L — ABNORMAL HIGH (ref 38–126)
BUN: 13 mg/dL (ref 6–20)
CHLORIDE: 92 mmol/L — AB (ref 101–111)
CO2: 28 mmol/L (ref 22–32)
Calcium: 9.6 mg/dL (ref 8.9–10.3)
Creatinine, Ser: 0.41 mg/dL — ABNORMAL LOW (ref 0.61–1.24)
GFR calc Af Amer: >60 mL/min (ref >60)
GFR calc non Af Amer: >60 mL/min (ref >60)
GLUCOSE: 114 mg/dL — AB (ref 65–99)
POTASSIUM: 4.3 mmol/L (ref 3.5–5.1)
SODIUM: 130 mmol/L — AB (ref 135–145)
TOTAL PROTEIN: 8.2 g/dL — AB (ref 6.5–8.1)
Total Bilirubin: 2.2 mg/dL — ABNORMAL HIGH (ref 0.3–1.2)

## 2016-09-21 LAB — CBC WITH DIFFERENTIAL/PLATELET
BASOS ABS: 0.2 10*3/uL — AB (ref 0–0.1)
Basophils Relative: 1 %
EOS ABS: 0.6 10*3/uL (ref 0–0.7)
Eosinophils Relative: 5 %
HCT: 40 % (ref 40.0–52.0)
Hemoglobin: 14 g/dL (ref 13.0–18.0)
Lymphocytes Relative: 18 %
Lymphs Abs: 2.3 10*3/uL (ref 1.0–3.6)
MCH: 31.2 pg (ref 26.0–34.0)
MCHC: 34.9 g/dL (ref 32.0–36.0)
MCV: 89.2 fL (ref 80.0–100.0)
MONO ABS: 0.9 10*3/uL (ref 0.2–1.0)
MONOS PCT: 7 %
NEUTROS ABS: 9.3 10*3/uL — AB (ref 1.4–6.5)
Neutrophils Relative %: 69 %
PLATELETS: 579 10*3/uL — AB (ref 150–440)
RBC: 4.49 MIL/uL (ref 4.40–5.90)
RDW: 16.3 % — AB (ref 11.5–14.5)
WBC: 13.4 10*3/uL — ABNORMAL HIGH (ref 3.8–10.6)

## 2016-09-21 MED ORDER — OXYCODONE HCL 10 MG PO TABS: 10.0000 mg | ORAL_TABLET | Freq: Four times a day (QID) | ORAL | 0 refills | Status: DC | PRN

## 2016-09-21 NOTE — Progress Notes (Signed)
Patient here today for new evaluation regarding pancreatic cancer.

## 2016-09-21 NOTE — Anesthesia Postprocedure Evaluation (Signed)
Anesthesia Post Note  Patient: Benjamin Diaz.  Procedure(s) Performed: Procedure(s): DIAGNOSTIC LAPAROSCOPIC LIVER BIOPSY     Patient location during evaluation: PACU Anesthesia Type: Epidural and General Level of consciousness: awake and alert Pain management: pain level controlled Vital Signs Assessment: post-procedure vital signs reviewed and stable Respiratory status: spontaneous breathing, nonlabored ventilation, respiratory function stable and patient connected to nasal cannula oxygen Cardiovascular status: blood pressure returned to baseline and stable Postop Assessment: no signs of nausea or vomiting Anesthetic complications: no    Last Vitals:  Vitals:   09/16/16 1211 09/16/16 1215  BP: (!) 146/82   Pulse: 75 70  Resp: (!) 29 (!) 26  Temp: 36.4 C     Last Pain:  Vitals:   09/16/16 1211  PainSc: 0-No pain                 [place]

## 2016-09-21 NOTE — Progress Notes (Signed)
  Oncology Nurse Navigator Documentation Met with Benjamin Diaz during and after consult with Dr. Grayland Ormond. Introduced Therapist, nutritional and provided contact information for future needs. All upcoming appointments went over and written copy provided. Chemo class starting tomorrow 6/27. Navigator Location: [facility] (09/21/16 1600)   )Navigator Encounter Type: Initial MedOnc (09/21/16 1600)                                          Acuity: Level 2 (09/21/16 1600)         Time Spent with Patient: 45 (09/21/16 1600)

## 2016-09-21 NOTE — Telephone Encounter (Signed)
Attempted to contact the patient to schedule his port placement and left a message for the patient to return my call.

## 2016-09-21 NOTE — Patient Instructions (Signed)
Nanoparticle Albumin-Bound Paclitaxel injection What is this medicine? NANOPARTICLE ALBUMIN-BOUND PACLITAXEL (Na no PAHR ti kuhl al BYOO muhn-bound PAK li TAX el) is a chemotherapy drug. It targets fast dividing cells, like cancer cells, and causes these cells to die. This medicine is used to treat advanced breast cancer and advanced lung cancer. This medicine may be used for other purposes; ask your health care provider or pharmacist if you have questions. COMMON BRAND NAME(S): Abraxane What should I tell my health care provider before I take this medicine? They need to know if you have any of these conditions: -kidney disease -liver disease -low blood counts, like low platelets, red blood cells, or white blood cells -recent or ongoing radiation therapy -an unusual or allergic reaction to paclitaxel, albumin, other chemotherapy, other medicines, foods, dyes, or preservatives -pregnant or trying to get pregnant -breast-feeding How should I use this medicine? This drug is given as an infusion into a vein. It is administered in a hospital or clinic by a specially trained health care professional. Talk to your pediatrician regarding the use of this medicine in children. Special care may be needed. Overdosage: If you think you have taken too much of this medicine contact a poison control center or emergency room at once. NOTE: This medicine is only for you. Do not share this medicine with others. What if I miss a dose? It is important not to miss your dose. Call your doctor or health care professional if you are unable to keep an appointment. What may interact with this medicine? -cyclosporine -diazepam -ketoconazole -medicines to increase blood counts like filgrastim, pegfilgrastim, sargramostim -other chemotherapy drugs like cisplatin, doxorubicin, epirubicin, etoposide, teniposide, vincristine -quinidine -testosterone -vaccines -verapamil Talk to your doctor or health care professional  before taking any of these medicines: -acetaminophen -aspirin -ibuprofen -ketoprofen -naproxen This list may not describe all possible interactions. Give your health care provider a list of all the medicines, herbs, non-prescription drugs, or dietary supplements you use. Also tell them if you smoke, drink alcohol, or use illegal drugs. Some items may interact with your medicine. What should I watch for while using this medicine? Your condition will be monitored carefully while you are receiving this medicine. You will need important blood work done while you are taking this medicine. This medicine can cause serious allergic reactions. If you experience allergic reactions like skin rash, itching or hives, swelling of the face, lips, or tongue, tell your doctor or health care professional right away. In some cases, you may be given additional medicines to help with side effects. Follow all directions for their use. This drug may make you feel generally unwell. This is not uncommon, as chemotherapy can affect healthy cells as well as cancer cells. Report any side effects. Continue your course of treatment even though you feel ill unless your doctor tells you to stop. Call your doctor or health care professional for advice if you get a fever, chills or sore throat, or other symptoms of a cold or flu. Do not treat yourself. This drug decreases your body's ability to fight infections. Try to avoid being around people who are sick. This medicine may increase your risk to bruise or bleed. Call your doctor or health care professional if you notice any unusual bleeding. Be careful brushing and flossing your teeth or using a toothpick because you may get an infection or bleed more easily. If you have any dental work done, tell your dentist you are receiving this medicine. Avoid taking products that  notice any unusual bleeding.  Be careful brushing and flossing your teeth or using a toothpick because you may get an infection or bleed more easily. If you have any dental work done, tell your dentist you are receiving this medicine.  Avoid taking products that contain aspirin, acetaminophen, ibuprofen, naproxen, or ketoprofen unless instructed by your doctor. These  medicines may hide a fever.  Do not become pregnant while taking this medicine. Women should inform their doctor if they wish to become pregnant or think they might be pregnant. There is a potential for serious side effects to an unborn child. Talk to your health care professional or pharmacist for more information. Do not breast-feed an infant while taking this medicine.  Men are advised not to father a child while receiving this medicine.  What side effects may I notice from receiving this medicine?  Side effects that you should report to your doctor or health care professional as soon as possible:  -allergic reactions like skin rash, itching or hives, swelling of the face, lips, or tongue  -low blood counts - This drug may decrease the number of white blood cells, red blood cells and platelets. You may be at increased risk for infections and bleeding.  -signs of infection - fever or chills, cough, sore throat, pain or difficulty passing urine  -signs of decreased platelets or bleeding - bruising, pinpoint red spots on the skin, black, tarry stools, nosebleeds  -signs of decreased red blood cells - unusually weak or tired, fainting spells, lightheadedness  -breathing problems  -changes in vision  -chest pain  -high or low blood pressure  -mouth sores  -nausea and vomiting  -pain, swelling, redness or irritation at the injection site  -pain, tingling, numbness in the hands or feet  -slow or irregular heartbeat  -swelling of the ankle, feet, hands  Side effects that usually do not require medical attention (report to your doctor or health care professional if they continue or are bothersome):  -aches, pains  -changes in the color of fingernails  -diarrhea  -hair loss  -loss of appetite  This list may not describe all possible side effects. Call your doctor for medical advice about side effects. You may report side effects to FDA at 1-800-FDA-1088.  Where should I keep my medicine?  This drug is given in a hospital  or clinic and will not be stored at home.  NOTE: This sheet is a summary. It may not cover all possible information. If you have questions about this medicine, talk to your doctor, pharmacist, or health care provider.   2018 [employer]/Gold Standard (2015-01-15 10:05:20)  Gemcitabine injection  What is this medicine?  GEMCITABINE (jem SIT a been) is a chemotherapy drug. This medicine is used to treat many types of cancer like breast cancer, lung cancer, pancreatic cancer, and ovarian cancer.  This medicine may be used for other purposes; ask your health care provider or pharmacist if you have questions.  COMMON BRAND NAME(S): Gemzar  What should I tell my health care provider before I take this medicine?  They need to know if you have any of these conditions:  -blood disorders  -infection  -kidney disease  -liver disease  -recent or ongoing radiation therapy  -an unusual or allergic reaction to gemcitabine, other chemotherapy, other medicines, foods, dyes, or preservatives  -pregnant or trying to get pregnant  -breast-feeding  How should I use this medicine?  This drug is given as an infusion into a vein. It is administered   in a hospital or clinic by a specially trained health care professional.  Talk to your pediatrician regarding the use of this medicine in children. Special care may be needed.  Overdosage: If you think you have taken too much of this medicine contact a poison control center or emergency room at once.  NOTE: This medicine is only for you. Do not share this medicine with others.  What if I miss a dose?  It is important not to miss your dose. Call your doctor or health care professional if you are unable to keep an appointment.  What may interact with this medicine?  -medicines to increase blood counts like filgrastim, pegfilgrastim, sargramostim  -some other chemotherapy drugs like cisplatin  -vaccines  Talk to your doctor or health care professional before taking any of these  medicines:  -acetaminophen  -aspirin  -ibuprofen  -ketoprofen  -naproxen  This list may not describe all possible interactions. Give your health care provider a list of all the medicines, herbs, non-prescription drugs, or dietary supplements you use. Also tell them if you smoke, drink alcohol, or use illegal drugs. Some items may interact with your medicine.  What should I watch for while using this medicine?  Visit your doctor for checks on your progress. This drug may make you feel generally unwell. This is not uncommon, as chemotherapy can affect healthy cells as well as cancer cells. Report any side effects. Continue your course of treatment even though you feel ill unless your doctor tells you to stop.  In some cases, you may be given additional medicines to help with side effects. Follow all directions for their use.  Call your doctor or health care professional for advice if you get a fever, chills or sore throat, or other symptoms of a cold or flu. Do not treat yourself. This drug decreases your body's ability to fight infections. Try to avoid being around people who are sick.  This medicine may increase your risk to bruise or bleed. Call your doctor or health care professional if you notice any unusual bleeding.  Be careful brushing and flossing your teeth or using a toothpick because you may get an infection or bleed more easily. If you have any dental work done, tell your dentist you are receiving this medicine.  Avoid taking products that contain aspirin, acetaminophen, ibuprofen, naproxen, or ketoprofen unless instructed by your doctor. These medicines may hide a fever.  Women should inform their doctor if they wish to become pregnant or think they might be pregnant. There is a potential for serious side effects to an unborn child. Talk to your health care professional or pharmacist for more information. Do not breast-feed an infant while taking this medicine.  What side effects may I notice from  receiving this medicine?  Side effects that you should report to your doctor or health care professional as soon as possible:  -allergic reactions like skin rash, itching or hives, swelling of the face, lips, or tongue  -low blood counts - this medicine may decrease the number of white blood cells, red blood cells and platelets. You may be at increased risk for infections and bleeding.  -signs of infection - fever or chills, cough, sore throat, pain or difficulty passing urine  -signs of decreased platelets or bleeding - bruising, pinpoint red spots on the skin, black, tarry stools, blood in the urine  -signs of decreased red blood cells - unusually weak or tired, fainting spells, lightheadedness  -breathing problems  -chest pain  -  mouth sores  -nausea and vomiting  -pain, swelling, redness at site where injected  -pain, tingling, numbness in the hands or feet  -stomach pain  -swelling of ankles, feet, hands  -unusual bleeding  Side effects that usually do not require medical attention (report to your doctor or health care professional if they continue or are bothersome):  -constipation  -diarrhea  -hair loss  -loss of appetite  -stomach upset  This list may not describe all possible side effects. Call your doctor for medical advice about side effects. You may report side effects to FDA at 1-800-FDA-1088.  Where should I keep my medicine?  This drug is given in a hospital or clinic and will not be stored at home.  NOTE: This sheet is a summary. It may not cover all possible information. If you have questions about this medicine, talk to your doctor, pharmacist, or health care provider.   2018 [employer]vier/Gold Standard (2007-07-25 18:45:54)

## 2016-09-21 NOTE — Anesthesia Procedure Notes (Signed)
Epidural Patient location during procedure: holding area Start time: 09/16/2016 7:44 AM End time: 09/16/2016 7:58 AM  Staffing Anesthesiologist: Oleta Mouse Performed: anesthesiologist   Preanesthetic Checklist Completed: patient identified, surgical consent, pre-op evaluation, timeout performed, IV checked, risks and benefits discussed and monitors and equipment checked  Epidural Patient position: sitting Prep: DuraPrep Patient monitoring: heart rate, cardiac monitor, continuous pulse ox and blood pressure Approach: midline Location: thoracic (1-12) Injection technique: LOR saline  Needle:  Needle type: Tuohy  Needle gauge: 17 G Needle length: 9 cm Needle insertion depth: 6 cm Catheter type: closed end flexible Catheter size: 19 Gauge Catheter at skin depth: 13 cm Test dose: negative and 1.5% lidocaine with Epi 1:200 K  Assessment Events: blood not aspirated, injection not painful, no injection resistance, negative IV test and no paresthesia  Additional Notes Reason for block:procedure for pain

## 2016-09-22 ENCOUNTER — Inpatient Hospital Stay: Payer: BLUE CROSS/BLUE SHIELD

## 2016-09-22 ENCOUNTER — Encounter: Payer: Self-pay | Admitting: *Deleted

## 2016-09-22 LAB — CANCER ANTIGEN 19-9: CA 19 9: 201 U/mL — AB (ref 0–35)

## 2016-09-22 NOTE — Patient Instructions (Signed)
Patient will require updates be sent to employer intermittently regarding status of treatments and need for time off, contact number is [phone]) [phone].

## 2016-09-23 ENCOUNTER — Inpatient Hospital Stay: Payer: BLUE CROSS/BLUE SHIELD

## 2016-09-25 DIAGNOSIS — Z7189 Other specified counseling: Secondary | ICD-10-CM | POA: Insufficient documentation

## 2016-09-25 MED ORDER — PROCHLORPERAZINE MALEATE 10 MG PO TABS: 10.0000 mg | ORAL_TABLET | Freq: Four times a day (QID) | ORAL | 2 refills | Status: DC | PRN

## 2016-09-25 MED ORDER — ONDANSETRON HCL 8 MG PO TABS: 8.0000 mg | ORAL_TABLET | Freq: Two times a day (BID) | ORAL | 2 refills | Status: DC | PRN

## 2016-09-25 MED ORDER — LIDOCAINE-PRILOCAINE 2.5-2.5 % EX CREA: TOPICAL_CREAM | CUTANEOUS | 3 refills | Status: DC

## 2016-09-25 NOTE — Progress Notes (Signed)
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 Yes AJCC T Category: [place] AJCC N Category: NX AJCC M Category: M1 AJCC 8 Stage Grouping: IV Line of therapy: First Line Would you be surprised if this patient died  in the next year? I would NOT be surprised if this patient died in the next year Intent of Therapy: Non-Curative / Palliative Intent, Discussed with Patient

## 2016-09-27 ENCOUNTER — Ambulatory Visit
Admission: RE | Admit: 2016-09-27 | Discharge: 2016-09-27 | Disposition: A | Discharge status: Home / Self Care | Payer: BLUE CROSS/BLUE SHIELD | Source: Ambulatory Visit | Attending: Oncology | Admitting: Oncology

## 2016-09-27 DIAGNOSIS — C787 Secondary malignant neoplasm of liver and intrahepatic bile duct: Secondary | ICD-10-CM | POA: Insufficient documentation

## 2016-09-27 DIAGNOSIS — C259 Malignant neoplasm of pancreas, unspecified: Secondary | ICD-10-CM | POA: Diagnosis not present

## 2016-09-27 DIAGNOSIS — S4991XA Unspecified injury of right shoulder and upper arm, initial encounter: Secondary | ICD-10-CM

## 2016-09-27 DIAGNOSIS — C7889 Secondary malignant neoplasm of other digestive organs: Secondary | ICD-10-CM | POA: Insufficient documentation

## 2016-09-27 HISTORY — DX: Unspecified injury of right shoulder and upper arm, initial encounter: S49.91XA

## 2016-09-27 LAB — GLUCOSE, CAPILLARY: Glucose-Capillary: 102 mg/dL — ABNORMAL HIGH (ref 65–99)

## 2016-09-27 MED ORDER — FLUDEOXYGLUCOSE F - 18 (FDG) INJECTION
12.0000 | Freq: Once | INTRAVENOUS | Status: AC | PRN
  Administered 2016-09-27: 12.132 via INTRAVENOUS

## 2016-09-27 NOTE — Progress Notes (Signed)
[place]  Telephone:([phone] [phone]  ID: Benjamin Diaz. OB: 12-10-1960  MR#: [id number]  BJS#:[id number]  Patient Care Team: Patient, [name] as PCP - General (General Practice) [name], MD as Consulting Physician (Oncology) [name], MD as Consulting Physician (Gastroenterology)  CHIEF COMPLAINT: Stage IV pancreatic cancer  INTERVAL HISTORY: Patient returns to clinic today for further evaluation and initiation of cycle 1, day 1 of gemcitabine and Abraxane. She continues to have increased weakness and fatigue. He has occasional abdominal pain. He has no neurologic complaints. He denies any fevers. He has a poor appetite. He has no chest pain or shortness of breath. He denies any abdominal pain he has no nausea, vomiting, constipation, or diarrhea. He has no urinary complaints. Patient offers no further specific complaints today.  REVIEW OF SYSTEMS:   Review of Systems  Constitutional: Positive for malaise/fatigue and weight loss. Negative for fever.  Respiratory: Negative.  Negative for cough and shortness of breath.   Cardiovascular: Negative.  Negative for chest pain and leg swelling.  Gastrointestinal: Positive for abdominal pain. Negative for nausea and vomiting.  Genitourinary: Negative.   Musculoskeletal: Negative.   Skin: Negative.  Negative for rash.  Neurological: Positive for weakness. Negative for sensory change.  Psychiatric/Behavioral: Negative.  The patient is not nervous/anxious.     As per HPI. Otherwise, a complete review of systems is negative.  PAST MEDICAL HISTORY: Past Medical History:  Diagnosis Date  . Arthritis   . Asthma   . Atrial fibrillation ([place])   . Closed left ankle fracture age 89  . COPD (chronic obstructive pulmonary disease) ([place])   . Dysrhythmia   . Pneumonia 2011  . Right shoulder injury 09/27/2016  . Sciatica    left leg pinched nerve numb at times about once a year    PAST  SURGICAL HISTORY: Past Surgical History:  Procedure Laterality Date  . BILIARY STENT PLACEMENT N/A 09/01/2016   Procedure: BILIARY STENT PLACEMENT;  Surgeon: [name], MD;  Location: [facility];  Service: Endoscopy;  Laterality: N/A;  . DIAGNOSTIC LAPAROSCOPIC LIVER BIOPSY  09/16/2016   Procedure: DIAGNOSTIC LAPAROSCOPIC LIVER BIOPSY;  Surgeon: [name], MD;  Location: [facility];  Service: General;;  . ERCP N/A 07/09/2016   Procedure: ENDOSCOPIC RETROGRADE CHOLANGIOPANCREATOGRAPHY (ERCP);  Surgeon: [name], MD;  Location: [facility];  Service: Endoscopy;  Laterality: N/A;  . ERCP N/A 09/01/2016   Procedure: ENDOSCOPIC RETROGRADE CHOLANGIOPANCREATOGRAPHY (ERCP);  Surgeon: [name], MD;  Location: [facility];  Service: Endoscopy;  Laterality: N/A;  . ESOPHAGOGASTRODUODENOSCOPY (EGD) WITH PROPOFOL N/A 09/01/2016   Procedure: ESOPHAGOGASTRODUODENOSCOPY (EGD) WITH PROPOFOL;  Surgeon: [name], MD;  [facility];  Service: Gastroenterology;  Laterality: N/A;  . EUS N/A 09/01/2016   Procedure: ESOPHAGEAL ENDOSCOPIC ULTRASOUND (EUS) RADIAL;  Surgeon: [name], MD;  [facility];  Service: Endoscopy;  Laterality: N/A;  . FINE NEEDLE ASPIRATION N/A 09/01/2016   Procedure: FINE NEEDLE ASPIRATION (FNA) RADIAL;  Surgeon: [name], MD;  [facility];  Service: Endoscopy;  Laterality: N/A;  . FOREIGN BODY REMOVAL N/A 09/01/2016   Procedure: FOREIGN BODY REMOVAL;  Surgeon: [name], MD;  [facility];  Service: Gastroenterology;  Laterality: N/A;  . KNEE SURGERY Right 2002   arthroscopy  . SHOULDER SURGERY Right 2002    FAMILY HISTORY: Family History  Problem Relation Age of Onset  . Heart disease Father     ADVANCED DIRECTIVES (Y/N):  N  HEALTH MAINTENANCE: Social  History  Substance Use Topics  . Smoking status: Current Every Day Smoker    Packs/day: 0.50    Years: 40.00    Types: Cigarettes  . Smokeless tobacco: Never Used   . Alcohol use 4.2 oz/week    7 Cans of beer per week     Comment: last drink 5 weeeks ago  april 2018     Colonoscopy:  PAP:  Bone density:  Lipid panel:  Allergies  Allergen Reactions  . No Known Allergies     Current Outpatient Prescriptions  Medication Sig Dispense Refill  . feeding supplement, ENSURE ENLIVE, (ENSURE ENLIVE) LIQD Take 237 mLs by mouth 3 (three) times daily between meals. 90 Bottle 0  . Ipratropium-Albuterol (COMBIVENT RESPIMAT) 20-100 MCG/ACT AERS respimat Inhale 1 puff into the lungs 4 (four) times daily.    Marland Kitchen lidocaine-prilocaine (EMLA) cream Apply to affected area once 30 g 3  . ondansetron (ZOFRAN) 8 MG tablet Take 1 tablet (8 mg total) by mouth 2 (two) times daily as needed (Nausea or vomiting). 60 tablet 2  . Oxycodone HCl 10 MG TABS Take 1 tablet (10 mg total) by mouth every 6 (six) hours as needed. 60 tablet 0  . prochlorperazine (COMPAZINE) 10 MG tablet Take 1 tablet (10 mg total) by mouth every 6 (six) hours as needed (Nausea or vomiting). 60 tablet 2  . gabapentin (NEURONTIN) 300 MG capsule Take 1 capsule (300 mg total) by mouth at bedtime. (Patient not taking: Reported on 07/29/2016) 30 capsule 3   No current facility-administered medications for this visit.    Facility-Administered Medications Ordered in Other Visits  Medication Dose Route Frequency Provider Last Rate Last Dose  . gemcitabine (GEMZAR) 1,300 mg in sodium chloride 0.9 % 100 mL chemo infusion  1,300 mg Intravenous Once Lloyd Huger, MD      . PACLitaxel-protein bound (ABRAXANE) chemo infusion 200 mg  200 mg Intravenous Once Lloyd Huger, MD        OBJECTIVE: Vitals:   09/28/16 1116  BP: (!) 133/94  Pulse: 73  Resp: 18  Temp: (!) 87.3 F (30.7 C)     Body mass index is 14.51 kg/m.    ECOG FS:2 - Symptomatic, <50% confined to bed  General: Thin, no acute distress. Eyes: Pink conjunctiva, anicteric sclera. Lungs: Clear to auscultation bilaterally. Heart: Regular  rate and rhythm. No rubs, murmurs, or gallops. Abdomen: Soft, nontender, nondistended. No organomegaly noted, normoactive bowel sounds. Musculoskeletal: No edema, cyanosis, or clubbing. Neuro: Alert, answering all questions appropriately. Cranial nerves grossly intact. Skin: No rashes or petechiae noted. Psych: Normal affect.   LAB RESULTS:  Lab Results  Component Value Date   NA 130 (L) 09/28/2016   K 4.1 09/28/2016   CL 92 (L) 09/28/2016   CO2 29 09/28/2016   GLUCOSE 98 09/28/2016   BUN 6 09/28/2016   CREATININE 0.61 09/28/2016   CALCIUM 9.8 09/28/2016   PROT 8.2 (H) 09/28/2016   ALBUMIN 3.8 09/28/2016   AST 83 (H) 09/28/2016   ALT 117 (H) 09/28/2016   ALKPHOS 785 (H) 09/28/2016   BILITOT 1.8 (H) 09/28/2016   GFRNONAA >60 09/28/2016   GFRAA >60 09/28/2016    Lab Results  Component Value Date   WBC 10.8 (H) 09/28/2016   NEUTROABS 7.0 (H) 09/28/2016   HGB 15.3 09/28/2016   HCT 43.3 09/28/2016   MCV 87.1 09/28/2016   PLT 506 (H) 09/28/2016   Lab Results  Component Value Date   CA199 201 (H) 09/21/2016  STUDIES: Nm Pet Image Initial (pi) Skull Base To Thigh  Result Date: 09/27/2016 CLINICAL DATA:  Subsequent treatment strategy for pancreatic carcinoma. EXAM: NUCLEAR MEDICINE PET SKULL BASE TO THIGH TECHNIQUE: 12.1 mCi F-18 FDG was injected intravenously. Full-ring PET imaging was performed from the skull base to thigh after the radiotracer. CT data was obtained and used for attenuation correction and anatomic localization. Set patient had extreme abdominal back pain in the supine position. Patient could not complete the entire PET portion. PET data was acquired from mid thigh to several cm below the carina. FASTING BLOOD GLUCOSE:  Value: 102 mg/dl COMPARISON:  MRI abdomen 07/08/2016 FINDINGS: PET data not require required for the neck and mid and upper chest. NECK No enlarged lymph nodes in the neck CHEST No suspicious pulmonary nodules.  No mediastinal  lymphadenopathy. ABDOMEN/PELVIS Intense metabolic activity within the pancreatic head surrounding the common bile duct stent with SUV max equal 9.6. A discrete mass difficult to define on noncontrast exam. Several foci of intense radiotracer activity within the RIGHT hepatic lobe most consistent with hepatic metastasis. Subtle accompanying low-attenuation on CT portion exam. Example lesion in the inferior RIGHT hepatic lobe (image 179 of fused data set) with SUV max equal 5.1. Cluster of three lesionsin the more superior RIGHT hepatic lobe with SUV max equals 7.3 (image 153 fused data set). No additional evidence of metastatic disease in the abdomen pelvis. No intraperitoneal nodal metastasis. New Nodular lesion adjacent to the gallbladder with intense metabolic activity measuring 15 mm (image 191, series 4) with SUV max equal 16.6. SKELETON No focal hypermetabolic activity to suggest skeletal metastasis. IMPRESSION: 1. Intense metabolic activity associated with the pancreatic head mass. 2. New hypermetabolic hepatic metastasis in RIGHT hepatic lobe. 3. New hypermetabolic nodular metastasis along the body of the gallbladder. 4. No peritoneal metastasis. 5. Patient could not complete the entire PET portion of exam due to severe discomfort. The mid and upper chest was not imaged by PET imaging. No suspicious nodules on CT imaging. No mediastinal lymph nodes identified. Electronically Signed   By: Suzy Bouchard M.D.   On: 09/27/2016 12:03   Dg Ercp  Result Date: 09/01/2016 CLINICAL DATA:  CBD obstruction and mass of pancreatic head. EXAM: ERCP TECHNIQUE: Multiple spot images obtained with the fluoroscopic device and submitted for interpretation post-procedure. COMPARISON:  07/09/2016 FINDINGS: Plastic endoscopic biliary stent was removed. Imaging demonstrates severe stricture of the mid to distal common bile duct. The covered metallic biliary stent was placed spanning across the stricture. IMPRESSION: Removal of  plastic endoscopic stent and placement of covered metallic biliary stent across persistent severe stricture of the common bile duct. These images were submitted for radiologic interpretation only. Please see the procedural report for the amount of contrast and the fluoroscopy time utilized. Electronically Signed   By: Aletta Edouard M.D.   On: 09/01/2016 14:09   Dg Abd Portable 1v  Result Date: 09/01/2016 CLINICAL DATA:  Possible foreign body EXAM: PORTABLE ABDOMEN - 1 VIEW COMPARISON:  None. FINDINGS: Scattered large small bowel gas is noted. A biliary tree stent is noted. A the radiopaque foreign body is noted within the midportion of the stomach consistent with the given clinical history of swallowed partial dental implant. No other focal abnormality is noted. IMPRESSION: Foreign body in the stomach consistent with the given clinical history. Electronically Signed   By: Inez Catalina M.D.   On: 09/01/2016 14:52    ASSESSMENT: Stage IV pancreatic cancer  PLAN:   1. Stage IV  pancreatic cancer: Imaging, pathology, and up note reviewed independently confirming stage IV disease with multiple peritoneal implants. PET scan results reviewed independently and reported as above. Patient's CA-19-9 is also mildly elevated at 201. Patient wishes to proceed with palliative chemotherapy using gemcitabine and Abraxane. Abraxane will have to be used with caution given his elevated bilirubin and liver enzymes. Have ordered port placement to happen in the next 1-2 weeks. Proceed with cycle 1, day 1 of gemcitabine and Abraxane today. Return to clinic in 1 week for consideration of cycle 1, day 8. 2. Pain: Continue oxycodone as needed. 3. Elevated liver enzymes: Improving. Secondary to malignancy, monitor. 4. Hyponatremia: Decreased, but stable. Monitor.  Patient expressed understanding and was in agreement with this plan. He also understands that He can call clinic at any time with any questions, concerns, or  complaints.   Cancer Staging Pancreatic cancer [facility]) Staging form: Exocrine Pancreas, AJCC 8th Edition - Clinical stage from 09/25/2016: Stage IV (cTX, cNX, pM1) - Signed by Lloyd Huger, MD on 09/25/2016   Lloyd Huger, MD   09/28/2016 12:42 PM

## 2016-09-28 ENCOUNTER — Inpatient Hospital Stay: Payer: BLUE CROSS/BLUE SHIELD | Attending: Oncology

## 2016-09-28 ENCOUNTER — Ambulatory Visit: Payer: BLUE CROSS/BLUE SHIELD

## 2016-09-28 ENCOUNTER — Inpatient Hospital Stay: Payer: BLUE CROSS/BLUE SHIELD

## 2016-09-28 ENCOUNTER — Ambulatory Visit: Payer: BLUE CROSS/BLUE SHIELD | Admitting: Oncology

## 2016-09-28 ENCOUNTER — Other Ambulatory Visit: Payer: BLUE CROSS/BLUE SHIELD

## 2016-09-28 ENCOUNTER — Inpatient Hospital Stay (HOSPITAL_BASED_OUTPATIENT_CLINIC_OR_DEPARTMENT_OTHER): Payer: BLUE CROSS/BLUE SHIELD | Admitting: Oncology

## 2016-09-28 VITALS — BP 133/94 | HR 73 | Temp 87.3°F | Resp 18 | Wt 107.0 lb

## 2016-09-28 DIAGNOSIS — R531 Weakness: Secondary | ICD-10-CM | POA: Insufficient documentation

## 2016-09-28 DIAGNOSIS — R21 Rash and other nonspecific skin eruption: Secondary | ICD-10-CM | POA: Insufficient documentation

## 2016-09-28 DIAGNOSIS — J449 Chronic obstructive pulmonary disease, unspecified: Secondary | ICD-10-CM | POA: Insufficient documentation

## 2016-09-28 DIAGNOSIS — C250 Malignant neoplasm of head of pancreas: Secondary | ICD-10-CM | POA: Insufficient documentation

## 2016-09-28 DIAGNOSIS — R5381 Other malaise: Secondary | ICD-10-CM | POA: Diagnosis not present

## 2016-09-28 DIAGNOSIS — R748 Abnormal levels of other serum enzymes: Secondary | ICD-10-CM

## 2016-09-28 DIAGNOSIS — E871 Hypo-osmolality and hyponatremia: Secondary | ICD-10-CM | POA: Insufficient documentation

## 2016-09-28 DIAGNOSIS — Z5111 Encounter for antineoplastic chemotherapy: Secondary | ICD-10-CM | POA: Insufficient documentation

## 2016-09-28 DIAGNOSIS — Z79899 Other long term (current) drug therapy: Secondary | ICD-10-CM

## 2016-09-28 DIAGNOSIS — F17210 Nicotine dependence, cigarettes, uncomplicated: Secondary | ICD-10-CM | POA: Insufficient documentation

## 2016-09-28 DIAGNOSIS — R634 Abnormal weight loss: Secondary | ICD-10-CM | POA: Diagnosis not present

## 2016-09-28 DIAGNOSIS — C259 Malignant neoplasm of pancreas, unspecified: Secondary | ICD-10-CM

## 2016-09-28 DIAGNOSIS — M1990 Unspecified osteoarthritis, unspecified site: Secondary | ICD-10-CM | POA: Insufficient documentation

## 2016-09-28 DIAGNOSIS — R630 Anorexia: Secondary | ICD-10-CM | POA: Insufficient documentation

## 2016-09-28 DIAGNOSIS — I4891 Unspecified atrial fibrillation: Secondary | ICD-10-CM

## 2016-09-28 DIAGNOSIS — C787 Secondary malignant neoplasm of liver and intrahepatic bile duct: Secondary | ICD-10-CM | POA: Diagnosis not present

## 2016-09-28 DIAGNOSIS — R5383 Other fatigue: Secondary | ICD-10-CM | POA: Insufficient documentation

## 2016-09-28 DIAGNOSIS — R109 Unspecified abdominal pain: Secondary | ICD-10-CM | POA: Insufficient documentation

## 2016-09-28 LAB — CBC WITH DIFFERENTIAL/PLATELET
BASOS PCT: 1 %
Basophils Absolute: 0.1 10*3/uL (ref 0–0.1)
Eosinophils Absolute: 0.8 10*3/uL — ABNORMAL HIGH (ref 0–0.7)
Eosinophils Relative: 7 %
HEMATOCRIT: 43.3 % (ref 40.0–52.0)
HEMOGLOBIN: 15.3 g/dL (ref 13.0–18.0)
Lymphocytes Relative: 19 %
Lymphs Abs: 2.1 10*3/uL (ref 1.0–3.6)
MCH: 30.7 pg (ref 26.0–34.0)
MCHC: 35.2 g/dL (ref 32.0–36.0)
MCV: 87.1 fL (ref 80.0–100.0)
MONOS PCT: 8 %
Monocytes Absolute: 0.9 10*3/uL (ref 0.2–1.0)
NEUTROS ABS: 7 10*3/uL — AB (ref 1.4–6.5)
Neutrophils Relative %: 65 %
Platelets: 506 10*3/uL — ABNORMAL HIGH (ref 150–440)
RBC: 4.97 MIL/uL (ref 4.40–5.90)
RDW: 16.3 % — ABNORMAL HIGH (ref 11.5–14.5)
WBC: 10.8 10*3/uL — AB (ref 3.8–10.6)

## 2016-09-28 LAB — COMPREHENSIVE METABOLIC PANEL
ALBUMIN: 3.8 g/dL (ref 3.5–5.0)
ALK PHOS: 785 U/L — AB (ref 38–126)
ALT: 117 U/L — ABNORMAL HIGH (ref 17–63)
AST: 83 U/L — AB (ref 15–41)
Anion gap: 9 (ref 5–15)
BILIRUBIN TOTAL: 1.8 mg/dL — AB (ref 0.3–1.2)
BUN: 6 mg/dL (ref 6–20)
CALCIUM: 9.8 mg/dL (ref 8.9–10.3)
CO2: 29 mmol/L (ref 22–32)
CREATININE: 0.61 mg/dL (ref 0.61–1.24)
Chloride: 92 mmol/L — ABNORMAL LOW (ref 101–111)
GFR calc Af Amer: >60 mL/min (ref >60)
GFR calc non Af Amer: >60 mL/min (ref >60)
GLUCOSE: 98 mg/dL (ref 65–99)
Potassium: 4.1 mmol/L (ref 3.5–5.1)
SODIUM: 130 mmol/L — AB (ref 135–145)
TOTAL PROTEIN: 8.2 g/dL — AB (ref 6.5–8.1)

## 2016-09-28 MED ORDER — PROCHLORPERAZINE MALEATE 10 MG PO TABS
10.0000 mg | ORAL_TABLET | Freq: Once | ORAL | Status: AC
  Administered 2016-09-28: 10 mg via ORAL

## 2016-09-28 MED ORDER — PACLITAXEL PROTEIN-BOUND CHEMO INJECTION 100 MG
200.0000 mg | Freq: Once | INTRAVENOUS | Status: AC
  Administered 2016-09-28: 200 mg via INTRAVENOUS
  Filled 2016-09-28: qty 40

## 2016-09-28 MED ORDER — SODIUM CHLORIDE 0.9 % IV SOLN
Freq: Once | INTRAVENOUS | Status: AC
  Administered 2016-09-28: 12:00:00 via INTRAVENOUS
  Filled 2016-09-28: qty 1000

## 2016-09-28 MED ORDER — SODIUM CHLORIDE 0.9 % IV SOLN
1300.0000 mg | Freq: Once | INTRAVENOUS | Status: AC
  Administered 2016-09-28: 1300 mg via INTRAVENOUS
  Filled 2016-09-28: qty 23.67

## 2016-09-28 NOTE — Progress Notes (Signed)
LFT's elevated greater than 80. Per Dr Grayland Ormond proceed with treatment

## 2016-09-28 NOTE — Telephone Encounter (Signed)
I attempted to contact the patient again to no avail, I left a message for him to return my call regarding getting him scheduled for his port placement.

## 2016-09-29 LAB — CANCER ANTIGEN 19-9: CAN 19-9: 185 U/mL — AB (ref 0–35)

## 2016-09-30 ENCOUNTER — Other Ambulatory Visit: Payer: BLUE CROSS/BLUE SHIELD

## 2016-09-30 ENCOUNTER — Ambulatory Visit: Payer: Self-pay

## 2016-10-01 ENCOUNTER — Encounter: Admission: RE | Payer: Self-pay | Source: Ambulatory Visit

## 2016-10-01 ENCOUNTER — Ambulatory Visit
Admission: RE | Admit: 2016-10-01 | Payer: BLUE CROSS/BLUE SHIELD | Source: Ambulatory Visit | Admitting: Vascular Surgery

## 2016-10-01 ENCOUNTER — Other Ambulatory Visit (INDEPENDENT_AMBULATORY_CARE_PROVIDER_SITE_OTHER): Payer: Self-pay | Admitting: Vascular Surgery

## 2016-10-01 SURGERY — PORTA CATH INSERTION
Anesthesia: Moderate Sedation

## 2016-10-01 NOTE — Progress Notes (Signed)
[place]  Telephone:([phone] [phone]  ID: Benjamin Diaz. OB: 1960-06-16  MR#: [id number]  QQV#:[id number]  Patient Care Team: Patient, [name] as PCP - General (General Practice) [name], MD as Consulting Physician (Oncology) [name], MD as Consulting Physician (Gastroenterology)  CHIEF COMPLAINT: Stage IV pancreatic cancer  INTERVAL HISTORY: Patient returns to clinic today for further evaluation and initiation of cycle 1, day 8 of gemcitabine and Abraxane. He developed a pruritic rash approximately 2 days after his treatment, but otherwise tolerated it well. He continues to have increased weakness and fatigue. He has occasional abdominal pain. He has no neurologic complaints. He denies any fevers. He has a poor appetite. He has no chest pain or shortness of breath. He denies any abdominal pain he has no nausea, vomiting, constipation, or diarrhea. He has no urinary complaints. Patient offers no further specific complaints today.  REVIEW OF SYSTEMS:   Review of Systems  Constitutional: Positive for malaise/fatigue and weight loss. Negative for fever.  Respiratory: Negative.  Negative for cough and shortness of breath.   Cardiovascular: Negative.  Negative for chest pain and leg swelling.  Gastrointestinal: Positive for abdominal pain. Negative for nausea and vomiting.  Genitourinary: Negative.   Musculoskeletal: Negative.   Skin: Positive for itching and rash.  Neurological: Positive for weakness. Negative for sensory change.  Psychiatric/Behavioral: Negative.  The patient is not nervous/anxious.     As per HPI. Otherwise, a complete review of systems is negative.  PAST MEDICAL HISTORY: Past Medical History:  Diagnosis Date  . Arthritis   . Asthma   . Atrial fibrillation ([place])   . Closed left ankle fracture age 32  . COPD (chronic obstructive pulmonary disease) ([place])   . Dysrhythmia   . Pneumonia 2011  . Right shoulder  injury 09/27/2016  . Sciatica    left leg pinched nerve numb at times about once a year    PAST SURGICAL HISTORY: Past Surgical History:  Procedure Laterality Date  . BILIARY STENT PLACEMENT N/A 09/01/2016   Procedure: BILIARY STENT PLACEMENT;  Surgeon: [name], MD;  [facility];  Service: Endoscopy;  Laterality: N/A;  . DIAGNOSTIC LAPAROSCOPIC LIVER BIOPSY  09/16/2016   Procedure: DIAGNOSTIC LAPAROSCOPIC LIVER BIOPSY;  Surgeon: [name], MD;  Location: [facility];  Service: General;;  . ERCP N/A 07/09/2016   Procedure: ENDOSCOPIC RETROGRADE CHOLANGIOPANCREATOGRAPHY (ERCP);  Surgeon: [name], MD;  Location: [facility];  Service: Endoscopy;  Laterality: N/A;  . ERCP N/A 09/01/2016   Procedure: ENDOSCOPIC RETROGRADE CHOLANGIOPANCREATOGRAPHY (ERCP);  Surgeon: [name], MD;  Location: [facility];  Service: Endoscopy;  Laterality: N/A;  . ESOPHAGOGASTRODUODENOSCOPY (EGD) WITH PROPOFOL N/A 09/01/2016   Procedure: ESOPHAGOGASTRODUODENOSCOPY (EGD) WITH PROPOFOL;  Surgeon: [name], MD;  [facility];  Service: Gastroenterology;  Laterality: N/A;  . EUS N/A 09/01/2016   Procedure: ESOPHAGEAL ENDOSCOPIC ULTRASOUND (EUS) RADIAL;  Surgeon: [name], MD;  [facility];  Service: Endoscopy;  Laterality: N/A;  . FINE NEEDLE ASPIRATION N/A 09/01/2016   Procedure: FINE NEEDLE ASPIRATION (FNA) RADIAL;  Surgeon: [name], MD;  Location[facility];  Service: Endoscopy;  Laterality: N/A;  . FOREIGN BODY REMOVAL N/A 09/01/2016   Procedure: FOREIGN BODY REMOVAL;  Surgeon: [name], MD;  [facility];  Service: Gastroenterology;  Laterality: N/A;  . KNEE SURGERY Right 2002   arthroscopy  . SHOULDER SURGERY Right 2002    FAMILY HISTORY: Family History  Problem Relation Age of Onset  .  Heart disease Father     ADVANCED DIRECTIVES (Y/N):  N  HEALTH MAINTENANCE: Social History  Substance Use Topics  . Smoking status: Current Every  Day Smoker    Packs/day: 0.50    Years: 40.00    Types: Cigarettes  . Smokeless tobacco: Never Used  . Alcohol use 4.2 oz/week    7 Cans of beer per week     Comment: last drink 5 weeeks ago  april 2018     Colonoscopy:  PAP:  Bone density:  Lipid panel:  Allergies  Allergen Reactions  . No Known Allergies     Current Outpatient Prescriptions  Medication Sig Dispense Refill  . feeding supplement, ENSURE ENLIVE, (ENSURE ENLIVE) LIQD Take 237 mLs by mouth 3 (three) times daily between meals. 90 Bottle 0  . Ipratropium-Albuterol (COMBIVENT RESPIMAT) 20-100 MCG/ACT AERS respimat Inhale 1 puff into the lungs 4 (four) times daily.    Marland Kitchen lidocaine-prilocaine (EMLA) cream Apply to affected area once 30 g 3  . ondansetron (ZOFRAN) 8 MG tablet Take 1 tablet (8 mg total) by mouth 2 (two) times daily as needed (Nausea or vomiting). 60 tablet 2  . prochlorperazine (COMPAZINE) 10 MG tablet Take 1 tablet (10 mg total) by mouth every 6 (six) hours as needed (Nausea or vomiting). 60 tablet 2  . gabapentin (NEURONTIN) 300 MG capsule Take 1 capsule (300 mg total) by mouth at bedtime. (Patient not taking: Reported on 07/29/2016) 30 capsule 3  . Oxycodone HCl 10 MG TABS Take 1 tablet (10 mg total) by mouth every 6 (six) hours as needed. (Patient not taking: Reported on 10/05/2016) 60 tablet 0   No current facility-administered medications for this visit.     OBJECTIVE: Vitals:   10/05/16 0927  BP: 121/79  Pulse: 91  Resp: 20  Temp: (!) 97.1 F (36.2 C)     Body mass index is 15.04 kg/m.    ECOG FS:2 - Symptomatic, <50% confined to bed  General: Thin, no acute distress. Eyes: Pink conjunctiva, anicteric sclera. Lungs: Clear to auscultation bilaterally. Heart: Regular rate and rhythm. No rubs, murmurs, or gallops. Abdomen: Soft, nontender, nondistended. No organomegaly noted, normoactive bowel sounds. Musculoskeletal: No edema, cyanosis, or clubbing. Neuro: Alert, answering all questions  appropriately. Cranial nerves grossly intact. Skin: Maculopapular rash noted on torso. Psych: Normal affect.   LAB RESULTS:  Lab Results  Component Value Date   NA 133 (L) 10/05/2016   K 3.4 (L) 10/05/2016   CL 92 (L) 10/05/2016   CO2 31 10/05/2016   GLUCOSE 114 (H) 10/05/2016   BUN <5 (L) 10/05/2016   CREATININE 0.49 (L) 10/05/2016   CALCIUM 9.0 10/05/2016   PROT 6.6 10/05/2016   ALBUMIN 3.3 (L) 10/05/2016   AST 72 (H) 10/05/2016   ALT 96 (H) 10/05/2016   ALKPHOS 668 (H) 10/05/2016   BILITOT 1.3 (H) 10/05/2016   GFRNONAA >60 10/05/2016   GFRAA >60 10/05/2016    Lab Results  Component Value Date   WBC 5.6 10/05/2016   NEUTROABS 2.2 10/05/2016   HGB 13.7 10/05/2016   HCT 38.7 (L) 10/05/2016   MCV 87.1 10/05/2016   PLT 286 10/05/2016   Lab Results  Component Value Date   CA199 201 (H) 09/21/2016     STUDIES: Nm Pet Image Initial (pi) Skull Base To Thigh  Result Date: 09/27/2016 CLINICAL DATA:  Subsequent treatment strategy for pancreatic carcinoma. EXAM: NUCLEAR MEDICINE PET SKULL BASE TO THIGH TECHNIQUE: 12.1 mCi F-18 FDG was injected intravenously. Full-ring PET  imaging was performed from the skull base to thigh after the radiotracer. CT data was obtained and used for attenuation correction and anatomic localization. Set patient had extreme abdominal back pain in the supine position. Patient could not complete the entire PET portion. PET data was acquired from mid thigh to several cm below the carina. FASTING BLOOD GLUCOSE:  Value: 102 mg/dl COMPARISON:  MRI abdomen 07/08/2016 FINDINGS: PET data not require required for the neck and mid and upper chest. NECK No enlarged lymph nodes in the neck CHEST No suspicious pulmonary nodules.  No mediastinal lymphadenopathy. ABDOMEN/PELVIS Intense metabolic activity within the pancreatic head surrounding the common bile duct stent with SUV max equal 9.6. A discrete mass difficult to define on noncontrast exam. Several foci of intense  radiotracer activity within the RIGHT hepatic lobe most consistent with hepatic metastasis. Subtle accompanying low-attenuation on CT portion exam. Example lesion in the inferior RIGHT hepatic lobe (image 179 of fused data set) with SUV max equal 5.1. Cluster of three lesionsin the more superior RIGHT hepatic lobe with SUV max equals 7.3 (image 153 fused data set). No additional evidence of metastatic disease in the abdomen pelvis. No intraperitoneal nodal metastasis. New Nodular lesion adjacent to the gallbladder with intense metabolic activity measuring 15 mm (image 191, series 4) with SUV max equal 16.6. SKELETON No focal hypermetabolic activity to suggest skeletal metastasis. IMPRESSION: 1. Intense metabolic activity associated with the pancreatic head mass. 2. New hypermetabolic hepatic metastasis in RIGHT hepatic lobe. 3. New hypermetabolic nodular metastasis along the body of the gallbladder. 4. No peritoneal metastasis. 5. Patient could not complete the entire PET portion of exam due to severe discomfort. The mid and upper chest was not imaged by PET imaging. No suspicious nodules on CT imaging. No mediastinal lymph nodes identified. Electronically Signed   By: Suzy Bouchard M.D.   On: 09/27/2016 12:03    ASSESSMENT: Stage IV pancreatic cancer  PLAN:   1. Stage IV pancreatic cancer: Imaging, pathology, and Op note reviewed independently confirming stage IV disease with multiple peritoneal implants. PET scan results reviewed independently and reported as above. Patient's CA-19-9 is also mildly elevated at 201. Patient wishes to proceed with palliative chemotherapy using gemcitabine and Abraxane. Given patient's rash, gemcitabine is the most likely culprit and will be held for today's treatment. Abraxane okay to use now that his bilirubin and liver enzymes are nearly within normal limits.  Proceed with cycle 1, day 8 of Abraxane today. Return to clinic in 1 week for consideration of cycle 1, day  15. 2. Pain: Continue oxycodone as needed. 3. Elevated liver enzymes: Improving. Secondary to malignancy, monitor. 4. Hyponatremia: Decreased, but stable. Monitor. 5. Rash: Hold gemcitabine as above. Have recommended OTC hydrocortisone cream.  Patient expressed understanding and was in agreement with this plan. He also understands that He can call clinic at any time with any questions, concerns, or complaints.   Cancer Staging Pancreatic cancer [facility]) Staging form: Exocrine Pancreas, AJCC 8th Edition - Clinical stage from 09/25/2016: Stage IV (cTX, cNX, pM1) - Signed by Lloyd Huger, MD on 09/25/2016   Lloyd Huger, MD   10/06/2016 11:54 AM

## 2016-10-05 ENCOUNTER — Inpatient Hospital Stay (HOSPITAL_BASED_OUTPATIENT_CLINIC_OR_DEPARTMENT_OTHER): Payer: BLUE CROSS/BLUE SHIELD | Admitting: Oncology

## 2016-10-05 ENCOUNTER — Inpatient Hospital Stay: Payer: BLUE CROSS/BLUE SHIELD

## 2016-10-05 VITALS — BP 121/79 | HR 91 | Temp 97.1°F | Resp 20 | Wt 110.9 lb

## 2016-10-05 DIAGNOSIS — C250 Malignant neoplasm of head of pancreas: Secondary | ICD-10-CM

## 2016-10-05 DIAGNOSIS — R5383 Other fatigue: Secondary | ICD-10-CM | POA: Diagnosis not present

## 2016-10-05 DIAGNOSIS — R634 Abnormal weight loss: Secondary | ICD-10-CM | POA: Diagnosis not present

## 2016-10-05 DIAGNOSIS — R748 Abnormal levels of other serum enzymes: Secondary | ICD-10-CM

## 2016-10-05 DIAGNOSIS — F17210 Nicotine dependence, cigarettes, uncomplicated: Secondary | ICD-10-CM

## 2016-10-05 DIAGNOSIS — R5381 Other malaise: Secondary | ICD-10-CM

## 2016-10-05 DIAGNOSIS — R630 Anorexia: Secondary | ICD-10-CM | POA: Diagnosis not present

## 2016-10-05 DIAGNOSIS — J449 Chronic obstructive pulmonary disease, unspecified: Secondary | ICD-10-CM

## 2016-10-05 DIAGNOSIS — C259 Malignant neoplasm of pancreas, unspecified: Secondary | ICD-10-CM

## 2016-10-05 DIAGNOSIS — R21 Rash and other nonspecific skin eruption: Secondary | ICD-10-CM | POA: Diagnosis not present

## 2016-10-05 DIAGNOSIS — R109 Unspecified abdominal pain: Secondary | ICD-10-CM | POA: Diagnosis not present

## 2016-10-05 DIAGNOSIS — Z79899 Other long term (current) drug therapy: Secondary | ICD-10-CM

## 2016-10-05 DIAGNOSIS — E871 Hypo-osmolality and hyponatremia: Secondary | ICD-10-CM | POA: Diagnosis not present

## 2016-10-05 DIAGNOSIS — I4891 Unspecified atrial fibrillation: Secondary | ICD-10-CM | POA: Diagnosis not present

## 2016-10-05 DIAGNOSIS — R531 Weakness: Secondary | ICD-10-CM | POA: Diagnosis not present

## 2016-10-05 DIAGNOSIS — M1990 Unspecified osteoarthritis, unspecified site: Secondary | ICD-10-CM

## 2016-10-05 LAB — CBC WITH DIFFERENTIAL/PLATELET
BASOS PCT: 1 %
Basophils Absolute: 0 10*3/uL (ref 0–0.1)
EOS ABS: 0.8 10*3/uL — AB (ref 0–0.7)
EOS PCT: 14 %
HEMATOCRIT: 38.7 % — AB (ref 40.0–52.0)
Hemoglobin: 13.7 g/dL (ref 13.0–18.0)
Lymphocytes Relative: 36 %
Lymphs Abs: 2 10*3/uL (ref 1.0–3.6)
MCH: 30.9 pg (ref 26.0–34.0)
MCHC: 35.5 g/dL (ref 32.0–36.0)
MCV: 87.1 fL (ref 80.0–100.0)
MONO ABS: 0.5 10*3/uL (ref 0.2–1.0)
MONOS PCT: 10 %
NEUTROS ABS: 2.2 10*3/uL (ref 1.4–6.5)
Neutrophils Relative %: 39 %
PLATELETS: 286 10*3/uL (ref 150–440)
RBC: 4.45 MIL/uL (ref 4.40–5.90)
RDW: 15.7 % — AB (ref 11.5–14.5)
WBC: 5.6 10*3/uL (ref 3.8–10.6)

## 2016-10-05 LAB — COMPREHENSIVE METABOLIC PANEL
ALBUMIN: 3.3 g/dL — AB (ref 3.5–5.0)
ALT: 96 U/L — ABNORMAL HIGH (ref 17–63)
ANION GAP: 10 (ref 5–15)
AST: 72 U/L — ABNORMAL HIGH (ref 15–41)
Alkaline Phosphatase: 668 U/L — ABNORMAL HIGH (ref 38–126)
BILIRUBIN TOTAL: 1.3 mg/dL — AB (ref 0.3–1.2)
BUN: <5 mg/dL — ABNORMAL LOW (ref 6–20)
CHLORIDE: 92 mmol/L — AB (ref 101–111)
CO2: 31 mmol/L (ref 22–32)
Calcium: 9 mg/dL (ref 8.9–10.3)
Creatinine, Ser: 0.49 mg/dL — ABNORMAL LOW (ref 0.61–1.24)
GFR calc Af Amer: >60 mL/min (ref >60)
GFR calc non Af Amer: >60 mL/min (ref >60)
GLUCOSE: 114 mg/dL — AB (ref 65–99)
POTASSIUM: 3.4 mmol/L — AB (ref 3.5–5.1)
Sodium: 133 mmol/L — ABNORMAL LOW (ref 135–145)
TOTAL PROTEIN: 6.6 g/dL (ref 6.5–8.1)

## 2016-10-05 MED ORDER — SODIUM CHLORIDE 0.9 % IV SOLN
Freq: Once | INTRAVENOUS | Status: AC
  Administered 2016-10-05: 10:00:00 via INTRAVENOUS
  Filled 2016-10-05: qty 1000

## 2016-10-05 MED ORDER — PROCHLORPERAZINE MALEATE 10 MG PO TABS
10.0000 mg | ORAL_TABLET | Freq: Once | ORAL | Status: AC
  Administered 2016-10-05: 10 mg via ORAL

## 2016-10-05 MED ORDER — PACLITAXEL PROTEIN-BOUND CHEMO INJECTION 100 MG
200.0000 mg | Freq: Once | INTRAVENOUS | Status: AC
  Administered 2016-10-05: 200 mg via INTRAVENOUS
  Filled 2016-10-05: qty 40

## 2016-10-05 NOTE — Progress Notes (Signed)
ALT: 11. MD, Dr. Grayland Ormond, notified via telephone. Per MD order: proceed with scheduled treatment today.

## 2016-10-05 NOTE — Progress Notes (Signed)
Patient denies any concerns today.  

## 2016-10-06 LAB — CANCER ANTIGEN 19-9: CA 19-9: 138 U/mL — ABNORMAL HIGH (ref 0–35)

## 2016-10-07 ENCOUNTER — Ambulatory Visit: Payer: Self-pay | Admitting: Adult Health Nurse Practitioner

## 2016-10-07 VITALS — BP 118/71 | Temp 98.1°F | Ht 72.0 in | Wt 110.0 lb

## 2016-10-07 DIAGNOSIS — J438 Other emphysema: Secondary | ICD-10-CM

## 2016-10-07 MED ORDER — ALBUTEROL SULFATE HFA 108 (90 BASE) MCG/ACT IN AERS: 1.0000 | INHALATION_SPRAY | Freq: Four times a day (QID) | RESPIRATORY_TRACT | 6 refills | Status: DC | PRN

## 2016-10-07 NOTE — Progress Notes (Signed)
  Patient: Benjamin Diaz. Male    DOB: 12-26-1960   56 y.o.   MRN: [id number] Visit Date: 10/07/2016  Today's Provider: Staci Acosta, NP   Chief Complaint  Patient presents with  . Follow-up  . Medication Refill   Subjective:    HPI  Pt had a biliary stent with an incidental finding of pancreatic tumor.  Had surgery 6.21 with biopsy of area- found to be malignant.  Started chemo 2 weeks ago. Denies nausea, vomiting.   Pt states that he needs inhalers.  States that the combivent worked well and he cannot afford most inhalers because he has a rx drug coverage plan from his job.     Allergies  Allergen Reactions  . No Known Allergies    Previous Medications   FEEDING SUPPLEMENT, ENSURE ENLIVE, (ENSURE ENLIVE) LIQD    Take 237 mLs by mouth 3 (three) times daily between meals.   GABAPENTIN (NEURONTIN) 300 MG CAPSULE    Take 1 capsule (300 mg total) by mouth at bedtime.   IPRATROPIUM-ALBUTEROL (COMBIVENT RESPIMAT) 20-100 MCG/ACT AERS RESPIMAT    Inhale 1 puff into the lungs 4 (four) times daily.   LIDOCAINE-PRILOCAINE (EMLA) CREAM    Apply to affected area once   ONDANSETRON (ZOFRAN) 8 MG TABLET    Take 1 tablet (8 mg total) by mouth 2 (two) times daily as needed (Nausea or vomiting).   OXYCODONE HCL 10 MG TABS    Take 1 tablet (10 mg total) by mouth every 6 (six) hours as needed.   PROCHLORPERAZINE (COMPAZINE) 10 MG TABLET    Take 1 tablet (10 mg total) by mouth every 6 (six) hours as needed (Nausea or vomiting).    Review of Systems  All other systems reviewed and are negative.   Social History  Substance Use Topics  . Smoking status: Current Every Day Smoker    Packs/day: 0.50    Years: 40.00    Types: Cigarettes  . Smokeless tobacco: Never Used  . Alcohol use 4.2 oz/week    7 Cans of beer per week     Comment: last drink 5 weeeks ago  april 2018   Objective:   BP 118/71 (BP Location: Left Arm)   Temp 98.1 F (36.7 C)   Ht 6' (1.829 m)   Wt 110 lb (49.9  kg)   BMI 14.92 kg/m   Physical Exam  Constitutional: He appears well-developed.  thin  Cardiovascular: Normal rate, regular rhythm and normal heart sounds.   Pulmonary/Chest: Effort normal and breath sounds normal.  Vitals reviewed.       Assessment & Plan:         COPD:  Given combivent respimat samples x3.  O2 sat 97%.   Declined therapy for support at this time.   Staci Acosta, NP   [facility]

## 2016-10-12 NOTE — Progress Notes (Signed)
[place]  [phone] Fax:(336) [phone]  ID: [name]. OB: [birth year]  MR#: [id number]  IOE#:[id number]  Patient Care Team: Patient, [name] as PCP - General (General Practice) [name], MD as Consulting Physician (Oncology) [name], MD as Consulting Physician (Gastroenterology)  CHIEF COMPLAINT: Stage IV pancreatic cancer  INTERVAL HISTORY: Patient returns to clinic today for further evaluation and initiation of cycle 1, day 15 of single agent Abraxane. Patient rash resolved with discontinue gemcitabine. He continues to have increased weakness and fatigue. He has occasional abdominal pain. He has no neurologic complaints. He denies any fevers. He has a poor appetite. He has no chest pain or shortness of breath. He denies any nausea, vomiting, constipation, or diarrhea. He has no urinary complaints. Patient offers no further specific complaints today.  REVIEW OF SYSTEMS:   Review of Systems  Constitutional: Positive for malaise/fatigue and weight loss. Negative for fever.  Respiratory: Negative.  Negative for cough and shortness of breath.   Cardiovascular: Negative.  Negative for chest pain and leg swelling.  Gastrointestinal: Positive for abdominal pain. Negative for nausea and vomiting.  Genitourinary: Negative.   Musculoskeletal: Negative.   Skin: Negative for itching and rash.  Neurological: Positive for weakness. Negative for sensory change.  Psychiatric/Behavioral: Negative.  The patient is not nervous/anxious.     As per HPI. Otherwise, a complete review of systems is negative.  PAST MEDICAL HISTORY: Past Medical History:  Diagnosis Date  . Arthritis   . Asthma   . Atrial fibrillation ([employer])   . Closed left ankle fracture age 56  . COPD (chronic obstructive pulmonary disease) ([place])   . Dysrhythmia   . Pneumonia 2011  . Right shoulder injury 09/27/2016  . Sciatica    left leg pinched nerve numb at times about  once a year    PAST SURGICAL HISTORY: Past Surgical History:  Procedure Laterality Date  . BILIARY STENT PLACEMENT N/A 09/01/2016   Procedure: BILIARY STENT PLACEMENT;  Surgeon: [name], MD;  Location: [facility];  Service: Endoscopy;  Laterality: N/A;  . DIAGNOSTIC LAPAROSCOPIC LIVER BIOPSY  09/16/2016   Procedure: DIAGNOSTIC LAPAROSCOPIC LIVER BIOPSY;  Surgeon: [name], MD;  Location: [facility];  Service: General;;  . ERCP N/A 07/09/2016   Procedure: ENDOSCOPIC RETROGRADE CHOLANGIOPANCREATOGRAPHY (ERCP);  Surgeon: [name], MD;  Location: [facility];  Service: Endoscopy;  Laterality: N/A;  . ERCP N/A 09/01/2016   Procedure: ENDOSCOPIC RETROGRADE CHOLANGIOPANCREATOGRAPHY (ERCP);  Surgeon: [name], MD;  Location: [facility];  Service: Endoscopy;  Laterality: N/A;  . ESOPHAGOGASTRODUODENOSCOPY (EGD) WITH PROPOFOL N/A 09/01/2016   Procedure: ESOPHAGOGASTRODUODENOSCOPY (EGD) WITH PROPOFOL;  Surgeon: [name], MD;  [facility];  Service: Gastroenterology;  Laterality: N/A;  . EUS N/A 09/01/2016   Procedure: ESOPHAGEAL ENDOSCOPIC ULTRASOUND (EUS) RADIAL;  Surgeon: [name], MD;  [facility];  Service: Endoscopy;  Laterality: N/A;  . FINE NEEDLE ASPIRATION N/A 09/01/2016   Procedure: FINE NEEDLE ASPIRATION (FNA) RADIAL;  Surgeon: [name], MD;  [facility];  Service: Endoscopy;  Laterality: N/A;  . FOREIGN BODY REMOVAL N/A 09/01/2016   Procedure: FOREIGN BODY REMOVAL;  Surgeon: [name], MD;  [facility];  Service: Gastroenterology;  Laterality: N/A;  . KNEE SURGERY Right 2002   arthroscopy  . SHOULDER SURGERY Right 2002    FAMILY HISTORY: Family History  Problem Relation Age of Onset  . Heart disease Father     ADVANCED DIRECTIVES (Y/N):  N  HEALTH MAINTENANCE:  Social History  Substance Use Topics  . Smoking status: Current Every Day Smoker    Packs/day: 0.50    Years: 40.00    Types: Cigarettes  .  Smokeless tobacco: Never Used  . Alcohol use 4.2 oz/week    7 Cans of beer per week     Comment: last drink 5 weeeks ago  april 2018     Colonoscopy:  PAP:  Bone density:  Lipid panel:  Allergies  Allergen Reactions  . No Known Allergies     Current Outpatient Prescriptions  Medication Sig Dispense Refill  . Ipratropium-Albuterol (COMBIVENT RESPIMAT) 20-100 MCG/ACT AERS respimat Inhale 1 puff into the lungs 4 (four) times daily.    Marland Kitchen lidocaine-prilocaine (EMLA) cream Apply to affected area once 30 g 3  . albuterol (PROVENTIL HFA;VENTOLIN HFA) 108 (90 Base) MCG/ACT inhaler Inhale 1-2 puffs into the lungs every 6 (six) hours as needed for wheezing or shortness of breath. 1 Inhaler 6  . feeding supplement, ENSURE ENLIVE, (ENSURE ENLIVE) LIQD Take 237 mLs by mouth 3 (three) times daily between meals. (Patient not taking: Reported on 10/07/2016) 90 Bottle 0  . gabapentin (NEURONTIN) 300 MG capsule Take 1 capsule (300 mg total) by mouth at bedtime. (Patient not taking: Reported on 07/29/2016) 30 capsule 3  . ondansetron (ZOFRAN) 8 MG tablet Take 1 tablet (8 mg total) by mouth 2 (two) times daily as needed (Nausea or vomiting). (Patient not taking: Reported on 10/07/2016) 60 tablet 2  . Oxycodone HCl 10 MG TABS Take 1 tablet (10 mg total) by mouth every 6 (six) hours as needed. (Patient not taking: Reported on 10/05/2016) 60 tablet 0  . prochlorperazine (COMPAZINE) 10 MG tablet Take 1 tablet (10 mg total) by mouth every 6 (six) hours as needed (Nausea or vomiting). (Patient not taking: Reported on 10/07/2016) 60 tablet 2   No current facility-administered medications for this visit.     OBJECTIVE: Vitals:   10/13/16 1011  BP: 119/81  Pulse: 91  Resp: 20  Temp: (!) 96.3 F (35.7 C)     Body mass index is 15.14 kg/m.    ECOG FS:1 - Symptomatic but completely ambulatory  General: Thin, no acute distress. Eyes: Pink conjunctiva, anicteric sclera. Lungs: Clear to auscultation  bilaterally. Heart: Regular rate and rhythm. No rubs, murmurs, or gallops. Abdomen: Soft, nontender, nondistended. No organomegaly noted, normoactive bowel sounds. Musculoskeletal: No edema, cyanosis, or clubbing. Neuro: Alert, answering all questions appropriately. Cranial nerves grossly intact. Skin: Maculopapular rash noted on torso. Psych: Normal affect.   LAB RESULTS:  Lab Results  Component Value Date   NA 127 (L) 10/13/2016   K 4.3 10/13/2016   CL 93 (L) 10/13/2016   CO2 27 10/13/2016   GLUCOSE 105 (H) 10/13/2016   BUN 10 10/13/2016   CREATININE 0.53 (L) 10/13/2016   CALCIUM 9.1 10/13/2016   PROT 6.7 10/13/2016   ALBUMIN 3.3 (L) 10/13/2016   AST 69 (H) 10/13/2016   ALT 63 10/13/2016   ALKPHOS 635 (H) 10/13/2016   BILITOT 0.8 10/13/2016   GFRNONAA >60 10/13/2016   GFRAA >60 10/13/2016    Lab Results  Component Value Date   WBC 7.4 10/13/2016   NEUTROABS 3.9 10/13/2016   HGB 13.5 10/13/2016   HCT 39.0 (L) 10/13/2016   MCV 88.3 10/13/2016   PLT 609 (H) 10/13/2016   Lab Results  Component Value Date   CA199 201 (H) 09/21/2016     STUDIES: Nm Pet Image Initial (pi) Skull Base To Thigh  Result Date: 09/27/2016 CLINICAL DATA:  Subsequent treatment strategy for pancreatic carcinoma. EXAM: NUCLEAR MEDICINE PET SKULL BASE TO THIGH TECHNIQUE: 12.1 mCi F-18 FDG was injected intravenously. Full-ring PET imaging was performed from the skull base to thigh after the radiotracer. CT data was obtained and used for attenuation correction and anatomic localization. Set patient had extreme abdominal back pain in the supine position. Patient could not complete the entire PET portion. PET data was acquired from mid thigh to several cm below the carina. FASTING BLOOD GLUCOSE:  Value: 102 mg/dl COMPARISON:  MRI abdomen 07/08/2016 FINDINGS: PET data not require required for the neck and mid and upper chest. NECK No enlarged lymph nodes in the neck CHEST No suspicious pulmonary nodules.   No mediastinal lymphadenopathy. ABDOMEN/PELVIS Intense metabolic activity within the pancreatic head surrounding the common bile duct stent with SUV max equal 9.6. A discrete mass difficult to define on noncontrast exam. Several foci of intense radiotracer activity within the RIGHT hepatic lobe most consistent with hepatic metastasis. Subtle accompanying low-attenuation on CT portion exam. Example lesion in the inferior RIGHT hepatic lobe (image 179 of fused data set) with SUV max equal 5.1. Cluster of three lesionsin the more superior RIGHT hepatic lobe with SUV max equals 7.3 (image 153 fused data set). No additional evidence of metastatic disease in the abdomen pelvis. No intraperitoneal nodal metastasis. New Nodular lesion adjacent to the gallbladder with intense metabolic activity measuring 15 mm (image 191, series 4) with SUV max equal 16.6. SKELETON No focal hypermetabolic activity to suggest skeletal metastasis. IMPRESSION: 1. Intense metabolic activity associated with the pancreatic head mass. 2. New hypermetabolic hepatic metastasis in RIGHT hepatic lobe. 3. New hypermetabolic nodular metastasis along the body of the gallbladder. 4. No peritoneal metastasis. 5. Patient could not complete the entire PET portion of exam due to severe discomfort. The mid and upper chest was not imaged by PET imaging. No suspicious nodules on CT imaging. No mediastinal lymph nodes identified. Electronically Signed   By: Suzy Bouchard M.D.   On: 09/27/2016 12:03    ASSESSMENT: Stage IV pancreatic cancer  PLAN:   1. Stage IV pancreatic cancer: Imaging, pathology, and Op note reviewed independently confirming stage IV disease with multiple peritoneal implants. PET scan results reviewed independently and reported as above. Patient's CA-19-9 is also mildly elevated at 201. Patient wishes to proceed with palliative chemotherapy. Given patient's rash, gemcitabine has been discontinued. Abraxane okay to use now that his  bilirubin and liver enzymes are nearly within normal limits.  Proceed with cycle 1, day 15 of Abraxane today. Return to clinic in 2 weeks for consideration of cycle 2, day 1. 2. Pain: Continue oxycodone as needed. 3. Elevated liver enzymes: Improving. Secondary to malignancy, monitor. 4. Hyponatremia: Decreased, but stable. Monitor. 5. Rash: Discontinue gemcitabine as above.  Patient expressed understanding and was in agreement with this plan. He also understands that He can call clinic at any time with any questions, concerns, or complaints.   Cancer Staging Pancreatic cancer [facility]) Staging form: Exocrine Pancreas, AJCC 8th Edition - Clinical stage from 09/25/2016: Stage IV (cTX, cNX, pM1) - Signed by Lloyd Huger, MD on 09/25/2016   Lloyd Huger, MD   10/13/2016 1:02 PM

## 2016-10-13 ENCOUNTER — Inpatient Hospital Stay (HOSPITAL_BASED_OUTPATIENT_CLINIC_OR_DEPARTMENT_OTHER): Payer: BLUE CROSS/BLUE SHIELD | Admitting: Oncology

## 2016-10-13 ENCOUNTER — Inpatient Hospital Stay: Payer: BLUE CROSS/BLUE SHIELD

## 2016-10-13 VITALS — BP 119/81 | HR 91 | Temp 96.3°F | Resp 20 | Wt 111.6 lb

## 2016-10-13 DIAGNOSIS — R5381 Other malaise: Secondary | ICD-10-CM | POA: Diagnosis not present

## 2016-10-13 DIAGNOSIS — R748 Abnormal levels of other serum enzymes: Secondary | ICD-10-CM | POA: Diagnosis not present

## 2016-10-13 DIAGNOSIS — J449 Chronic obstructive pulmonary disease, unspecified: Secondary | ICD-10-CM | POA: Diagnosis not present

## 2016-10-13 DIAGNOSIS — C259 Malignant neoplasm of pancreas, unspecified: Secondary | ICD-10-CM

## 2016-10-13 DIAGNOSIS — C250 Malignant neoplasm of head of pancreas: Secondary | ICD-10-CM | POA: Diagnosis not present

## 2016-10-13 DIAGNOSIS — R5383 Other fatigue: Secondary | ICD-10-CM

## 2016-10-13 DIAGNOSIS — M1990 Unspecified osteoarthritis, unspecified site: Secondary | ICD-10-CM

## 2016-10-13 DIAGNOSIS — R531 Weakness: Secondary | ICD-10-CM | POA: Diagnosis not present

## 2016-10-13 DIAGNOSIS — F17210 Nicotine dependence, cigarettes, uncomplicated: Secondary | ICD-10-CM | POA: Diagnosis not present

## 2016-10-13 DIAGNOSIS — I4891 Unspecified atrial fibrillation: Secondary | ICD-10-CM | POA: Diagnosis not present

## 2016-10-13 DIAGNOSIS — C787 Secondary malignant neoplasm of liver and intrahepatic bile duct: Secondary | ICD-10-CM | POA: Diagnosis not present

## 2016-10-13 DIAGNOSIS — E871 Hypo-osmolality and hyponatremia: Secondary | ICD-10-CM | POA: Diagnosis not present

## 2016-10-13 DIAGNOSIS — R109 Unspecified abdominal pain: Secondary | ICD-10-CM | POA: Diagnosis not present

## 2016-10-13 DIAGNOSIS — Z79899 Other long term (current) drug therapy: Secondary | ICD-10-CM

## 2016-10-13 LAB — COMPREHENSIVE METABOLIC PANEL
ALK PHOS: 635 U/L — AB (ref 38–126)
ALT: 63 U/L (ref 17–63)
AST: 69 U/L — ABNORMAL HIGH (ref 15–41)
Albumin: 3.3 g/dL — ABNORMAL LOW (ref 3.5–5.0)
Anion gap: 7 (ref 5–15)
BILIRUBIN TOTAL: 0.8 mg/dL (ref 0.3–1.2)
BUN: 10 mg/dL (ref 6–20)
CALCIUM: 9.1 mg/dL (ref 8.9–10.3)
CO2: 27 mmol/L (ref 22–32)
CREATININE: 0.53 mg/dL — AB (ref 0.61–1.24)
Chloride: 93 mmol/L — ABNORMAL LOW (ref 101–111)
GFR calc non Af Amer: >60 mL/min (ref >60)
Glucose, Bld: 105 mg/dL — ABNORMAL HIGH (ref 65–99)
Potassium: 4.3 mmol/L (ref 3.5–5.1)
SODIUM: 127 mmol/L — AB (ref 135–145)
TOTAL PROTEIN: 6.7 g/dL (ref 6.5–8.1)

## 2016-10-13 LAB — CBC WITH DIFFERENTIAL/PLATELET
Basophils Absolute: 0 10*3/uL (ref 0–0.1)
Basophils Relative: 0 %
Eosinophils Absolute: 0.6 10*3/uL (ref 0–0.7)
Eosinophils Relative: 8 %
HEMATOCRIT: 39 % — AB (ref 40.0–52.0)
HEMOGLOBIN: 13.5 g/dL (ref 13.0–18.0)
LYMPHS ABS: 2.1 10*3/uL (ref 1.0–3.6)
LYMPHS PCT: 29 %
MCH: 30.6 pg (ref 26.0–34.0)
MCHC: 34.6 g/dL (ref 32.0–36.0)
MCV: 88.3 fL (ref 80.0–100.0)
MONOS PCT: 11 %
Monocytes Absolute: 0.8 10*3/uL (ref 0.2–1.0)
NEUTROS ABS: 3.9 10*3/uL (ref 1.4–6.5)
NEUTROS PCT: 52 %
Platelets: 609 10*3/uL — ABNORMAL HIGH (ref 150–440)
RBC: 4.42 MIL/uL (ref 4.40–5.90)
RDW: 15.8 % — ABNORMAL HIGH (ref 11.5–14.5)
WBC: 7.4 10*3/uL (ref 3.8–10.6)

## 2016-10-13 MED ORDER — PROCHLORPERAZINE MALEATE 10 MG PO TABS
10.0000 mg | ORAL_TABLET | Freq: Once | ORAL | Status: AC
  Administered 2016-10-13: 10 mg via ORAL
  Filled 2016-10-13: qty 1

## 2016-10-13 MED ORDER — SODIUM CHLORIDE 0.9 % IV SOLN
1300.0000 mg | Freq: Once | INTRAVENOUS | Status: DC
  Filled 2016-10-13: qty 34

## 2016-10-13 MED ORDER — PACLITAXEL PROTEIN-BOUND CHEMO INJECTION 100 MG
200.0000 mg | Freq: Once | INTRAVENOUS | Status: AC
  Administered 2016-10-13: 200 mg via INTRAVENOUS
  Filled 2016-10-13: qty 40

## 2016-10-13 MED ORDER — SODIUM CHLORIDE 0.9 % IV SOLN
Freq: Once | INTRAVENOUS | Status: AC
  Administered 2016-10-13: 11:00:00 via INTRAVENOUS
  Filled 2016-10-13: qty 1000

## 2016-10-13 NOTE — Progress Notes (Signed)
Patient reports pain in abdomen 3/10, denies any other concerns today.

## 2016-10-14 LAB — CANCER ANTIGEN 19-9: CA 19-9: 127 U/mL — ABNORMAL HIGH (ref 0–35)

## 2016-10-27 ENCOUNTER — Inpatient Hospital Stay (HOSPITAL_BASED_OUTPATIENT_CLINIC_OR_DEPARTMENT_OTHER): Payer: BLUE CROSS/BLUE SHIELD | Admitting: Oncology

## 2016-10-27 ENCOUNTER — Inpatient Hospital Stay: Payer: BLUE CROSS/BLUE SHIELD | Attending: Oncology

## 2016-10-27 ENCOUNTER — Other Ambulatory Visit: Payer: Self-pay

## 2016-10-27 ENCOUNTER — Inpatient Hospital Stay: Payer: BLUE CROSS/BLUE SHIELD

## 2016-10-27 VITALS — BP 113/78 | HR 102 | Temp 96.3°F | Resp 20 | Wt 114.3 lb

## 2016-10-27 DIAGNOSIS — C250 Malignant neoplasm of head of pancreas: Secondary | ICD-10-CM | POA: Insufficient documentation

## 2016-10-27 DIAGNOSIS — Z79899 Other long term (current) drug therapy: Secondary | ICD-10-CM | POA: Insufficient documentation

## 2016-10-27 DIAGNOSIS — C787 Secondary malignant neoplasm of liver and intrahepatic bile duct: Secondary | ICD-10-CM | POA: Diagnosis not present

## 2016-10-27 DIAGNOSIS — F17210 Nicotine dependence, cigarettes, uncomplicated: Secondary | ICD-10-CM | POA: Insufficient documentation

## 2016-10-27 DIAGNOSIS — E871 Hypo-osmolality and hyponatremia: Secondary | ICD-10-CM | POA: Diagnosis not present

## 2016-10-27 DIAGNOSIS — J449 Chronic obstructive pulmonary disease, unspecified: Secondary | ICD-10-CM | POA: Diagnosis not present

## 2016-10-27 DIAGNOSIS — R748 Abnormal levels of other serum enzymes: Secondary | ICD-10-CM | POA: Insufficient documentation

## 2016-10-27 DIAGNOSIS — I4891 Unspecified atrial fibrillation: Secondary | ICD-10-CM | POA: Diagnosis not present

## 2016-10-27 DIAGNOSIS — R21 Rash and other nonspecific skin eruption: Secondary | ICD-10-CM | POA: Diagnosis not present

## 2016-10-27 DIAGNOSIS — C259 Malignant neoplasm of pancreas, unspecified: Secondary | ICD-10-CM

## 2016-10-27 DIAGNOSIS — R531 Weakness: Secondary | ICD-10-CM

## 2016-10-27 DIAGNOSIS — Z5111 Encounter for antineoplastic chemotherapy: Secondary | ICD-10-CM | POA: Insufficient documentation

## 2016-10-27 DIAGNOSIS — R1033 Periumbilical pain: Secondary | ICD-10-CM | POA: Diagnosis not present

## 2016-10-27 DIAGNOSIS — R5381 Other malaise: Secondary | ICD-10-CM | POA: Diagnosis not present

## 2016-10-27 DIAGNOSIS — R600 Localized edema: Secondary | ICD-10-CM | POA: Insufficient documentation

## 2016-10-27 DIAGNOSIS — R5383 Other fatigue: Secondary | ICD-10-CM | POA: Diagnosis not present

## 2016-10-27 LAB — CBC WITH DIFFERENTIAL/PLATELET
Basophils Absolute: 0.2 10*3/uL — ABNORMAL HIGH (ref 0–0.1)
Basophils Relative: 2 %
EOS ABS: 0.5 10*3/uL (ref 0–0.7)
EOS PCT: 5 %
HCT: 43.4 % (ref 40.0–52.0)
Hemoglobin: 15 g/dL (ref 13.0–18.0)
LYMPHS ABS: 2 10*3/uL (ref 1.0–3.6)
LYMPHS PCT: 21 %
MCH: 31.2 pg (ref 26.0–34.0)
MCHC: 34.6 g/dL (ref 32.0–36.0)
MCV: 90.3 fL (ref 80.0–100.0)
MONO ABS: 1.5 10*3/uL — AB (ref 0.2–1.0)
MONOS PCT: 16 %
Neutro Abs: 5.5 10*3/uL (ref 1.4–6.5)
Neutrophils Relative %: 56 %
PLATELETS: 578 10*3/uL — AB (ref 150–440)
RBC: 4.81 MIL/uL (ref 4.40–5.90)
RDW: 17.3 % — AB (ref 11.5–14.5)
WBC: 9.8 10*3/uL (ref 3.8–10.6)

## 2016-10-27 LAB — COMPREHENSIVE METABOLIC PANEL
ALT: 61 U/L (ref 17–63)
ANION GAP: 8 (ref 5–15)
AST: 53 U/L — ABNORMAL HIGH (ref 15–41)
Albumin: 3.4 g/dL — ABNORMAL LOW (ref 3.5–5.0)
Alkaline Phosphatase: 582 U/L — ABNORMAL HIGH (ref 38–126)
BUN: <5 mg/dL — ABNORMAL LOW (ref 6–20)
CHLORIDE: 94 mmol/L — AB (ref 101–111)
CO2: 30 mmol/L (ref 22–32)
CREATININE: 0.5 mg/dL — AB (ref 0.61–1.24)
Calcium: 9.5 mg/dL (ref 8.9–10.3)
Glucose, Bld: 110 mg/dL — ABNORMAL HIGH (ref 65–99)
Potassium: 4.3 mmol/L (ref 3.5–5.1)
SODIUM: 132 mmol/L — AB (ref 135–145)
Total Bilirubin: 0.5 mg/dL (ref 0.3–1.2)
Total Protein: 6.9 g/dL (ref 6.5–8.1)

## 2016-10-27 MED ORDER — PACLITAXEL PROTEIN-BOUND CHEMO INJECTION 100 MG
200.0000 mg | Freq: Once | INTRAVENOUS | Status: AC
  Administered 2016-10-27: 200 mg via INTRAVENOUS
  Filled 2016-10-27: qty 40

## 2016-10-27 MED ORDER — SODIUM CHLORIDE 0.9 % IV SOLN
Freq: Once | INTRAVENOUS | Status: AC
  Administered 2016-10-27: 11:00:00 via INTRAVENOUS
  Filled 2016-10-27: qty 1000

## 2016-10-27 MED ORDER — PROCHLORPERAZINE MALEATE 10 MG PO TABS
10.0000 mg | ORAL_TABLET | Freq: Once | ORAL | Status: AC
  Administered 2016-10-27: 10 mg via ORAL
  Filled 2016-10-27: qty 1

## 2016-10-27 NOTE — Progress Notes (Signed)
Patient denies any concerns today.  

## 2016-10-27 NOTE — Progress Notes (Signed)
[place]  Telephone:([phone] Fax:(336) [phone]  ID: Benjamin Diaz. OB: 1960-08-16  MR#: [id number]  CZY#:[id number]  Patient Care Team: Patient, [name] as PCP - General (General Practice) [name], MD as Consulting Physician (Oncology) [name], MD as Consulting Physician (Gastroenterology)  CHIEF COMPLAINT: Stage IV pancreatic cancer  INTERVAL HISTORY: Patient returns to clinic today for further evaluation and initiation of cycle 2, day 1 of single agent Abraxane. Patient rash has resolved with discontinue gemcitabine. He continues to have increased weakness and fatigue and occasional right sided and perumbilical abdominal pain. He has no neurologic complaints. He denies any fevers. He has a poor appetite. He has no chest pain or shortness of breath. He denies any nausea, vomiting, constipation, or diarrhea. He has no urinary complaints. Patient offers no further specific complaints today.  REVIEW OF SYSTEMS:   Review of Systems  Constitutional: Positive for malaise/fatigue. Negative for fever and weight loss.  Respiratory: Negative.  Negative for cough and shortness of breath.   Cardiovascular: Negative.  Negative for chest pain and leg swelling.  Gastrointestinal: Positive for abdominal pain. Negative for nausea and vomiting.  Genitourinary: Negative.   Musculoskeletal: Negative.   Skin: Negative for itching and rash.  Neurological: Positive for weakness. Negative for sensory change.  Psychiatric/Behavioral: Negative.  The patient is not nervous/anxious.     As per HPI. Otherwise, a complete review of systems is negative.  PAST MEDICAL HISTORY: Past Medical History:  Diagnosis Date  . Arthritis   . Asthma   . Atrial fibrillation ([place])   . Closed left ankle fracture age 78  . COPD (chronic obstructive pulmonary disease) ([place])   . Dysrhythmia   . Pneumonia 2011  . Right shoulder injury 09/27/2016  . Sciatica    left leg  pinched nerve numb at times about once a year    PAST SURGICAL HISTORY: Past Surgical History:  Procedure Laterality Date  . BILIARY STENT PLACEMENT N/A 09/01/2016   Procedure: BILIARY STENT PLACEMENT;  Surgeon: [name], MD;  [facility];  Service: Endoscopy;  Laterality: N/A;  . DIAGNOSTIC LAPAROSCOPIC LIVER BIOPSY  09/16/2016   Procedure: DIAGNOSTIC LAPAROSCOPIC LIVER BIOPSY;  Surgeon: [name], MD;  Location: [facility];  Service: General;;  . ERCP N/A 07/09/2016   Procedure: ENDOSCOPIC RETROGRADE CHOLANGIOPANCREATOGRAPHY (ERCP);  Surgeon: [name], MD;  Location: [facility];  Service: Endoscopy;  Laterality: N/A;  . ERCP N/A 09/01/2016   Procedure: ENDOSCOPIC RETROGRADE CHOLANGIOPANCREATOGRAPHY (ERCP);  Surgeon: [name], MD;  Location: [facility];  Service: Endoscopy;  Laterality: N/A;  . ESOPHAGOGASTRODUODENOSCOPY (EGD) WITH PROPOFOL N/A 09/01/2016   Procedure: ESOPHAGOGASTRODUODENOSCOPY (EGD) WITH PROPOFOL;  Surgeon: [name], MD;  [facility];  Service: Gastroenterology;  Laterality: N/A;  . EUS N/A 09/01/2016   Procedure: ESOPHAGEAL ENDOSCOPIC ULTRASOUND (EUS) RADIAL;  Surgeon: [name], MD;  [facility];  Service: Endoscopy;  Laterality: N/A;  . FINE NEEDLE ASPIRATION N/A 09/01/2016   Procedure: FINE NEEDLE ASPIRATION (FNA) RADIAL;  Surgeon: [name], MD;  [facility];  Service: Endoscopy;  Laterality: N/A;  . FOREIGN BODY REMOVAL N/A 09/01/2016   Procedure: FOREIGN BODY REMOVAL;  Surgeon: [name], MD;  [facility];  Service: Gastroenterology;  Laterality: N/A;  . KNEE SURGERY Right 2002   arthroscopy  . SHOULDER SURGERY Right 2002    FAMILY HISTORY: Family History  Problem Relation Age of Onset  . Heart disease Father     ADVANCED DIRECTIVES (Y/N):  N  HEALTH MAINTENANCE: Social History  Substance Use Topics  . Smoking status: Current Every Day Smoker    Packs/day: 0.50    Years:  40.00    Types: Cigarettes  . Smokeless tobacco: Never Used  . Alcohol use 4.2 oz/week    7 Cans of beer per week     Comment: last drink 5 weeeks ago  april 2018     Colonoscopy:  PAP:  Bone density:  Lipid panel:  Allergies  Allergen Reactions  . No Known Allergies     Current Outpatient Prescriptions  Medication Sig Dispense Refill  . albuterol (PROVENTIL HFA;VENTOLIN HFA) 108 (90 Base) MCG/ACT inhaler Inhale 1-2 puffs into the lungs every 6 (six) hours as needed for wheezing or shortness of breath. 1 Inhaler 6  . Ipratropium-Albuterol (COMBIVENT RESPIMAT) 20-100 MCG/ACT AERS respimat Inhale 1 puff into the lungs 4 (four) times daily.    Marland Kitchen lidocaine-prilocaine (EMLA) cream Apply to affected area once 30 g 3   No current facility-administered medications for this visit.     OBJECTIVE: Vitals:   10/27/16 1006  BP: 113/78  Pulse: (!) 102  Resp: 20  Temp: (!) 96.3 F (35.7 C)     Body mass index is 15.5 kg/m.    ECOG FS:1 - Symptomatic but completely ambulatory  General: Thin, no acute distress. Eyes: Pink conjunctiva, anicteric sclera. Lungs: Clear to auscultation bilaterally. Heart: Regular rate and rhythm. No rubs, murmurs, or gallops. Abdomen: Soft, nontender, nondistended. No organomegaly noted, normoactive bowel sounds. Musculoskeletal: No edema, cyanosis, or clubbing. Neuro: Alert, answering all questions appropriately. Cranial nerves grossly intact. Skin: Maculopapular rash noted on torso. Psych: Normal affect.   LAB RESULTS:  Lab Results  Component Value Date   NA 132 (L) 10/27/2016   K 4.3 10/27/2016   CL 94 (L) 10/27/2016   CO2 30 10/27/2016   GLUCOSE 110 (H) 10/27/2016   BUN <5 (L) 10/27/2016   CREATININE 0.50 (L) 10/27/2016   CALCIUM 9.5 10/27/2016   PROT 6.9 10/27/2016   ALBUMIN 3.4 (L) 10/27/2016   AST 53 (H) 10/27/2016   ALT 61 10/27/2016   ALKPHOS 582 (H) 10/27/2016   BILITOT 0.5 10/27/2016   GFRNONAA >60 10/27/2016   GFRAA >60  10/27/2016    Lab Results  Component Value Date   WBC 9.8 10/27/2016   NEUTROABS 5.5 10/27/2016   HGB 15.0 10/27/2016   HCT 43.4 10/27/2016   MCV 90.3 10/27/2016   PLT 578 (H) 10/27/2016   Lab Results  Component Value Date   CA199 201 (H) 09/21/2016     STUDIES: Nm Pet Image Initial (pi) Skull Base To Thigh  Result Date: 09/27/2016 CLINICAL DATA:  Subsequent treatment strategy for pancreatic carcinoma. EXAM: NUCLEAR MEDICINE PET SKULL BASE TO THIGH TECHNIQUE: 12.1 mCi F-18 FDG was injected intravenously. Full-ring PET imaging was performed from the skull base to thigh after the radiotracer. CT data was obtained and used for attenuation correction and anatomic localization. Set patient had extreme abdominal back pain in the supine position. Patient could not complete the entire PET portion. PET data was acquired from mid thigh to several cm below the carina. FASTING BLOOD GLUCOSE:  Value: 102 mg/dl COMPARISON:  MRI abdomen 07/08/2016 FINDINGS: PET data not require required for the neck and mid and upper chest. NECK No enlarged lymph nodes in the neck CHEST No suspicious pulmonary nodules.  No mediastinal lymphadenopathy. ABDOMEN/PELVIS Intense metabolic activity within the pancreatic head surrounding the common bile duct stent with SUV  max equal 9.6. A discrete mass difficult to define on noncontrast exam. Several foci of intense radiotracer activity within the RIGHT hepatic lobe most consistent with hepatic metastasis. Subtle accompanying low-attenuation on CT portion exam. Example lesion in the inferior RIGHT hepatic lobe (image 179 of fused data set) with SUV max equal 5.1. Cluster of three lesionsin the more superior RIGHT hepatic lobe with SUV max equals 7.3 (image 153 fused data set). No additional evidence of metastatic disease in the abdomen pelvis. No intraperitoneal nodal metastasis. New Nodular lesion adjacent to the gallbladder with intense metabolic activity measuring 15 mm (image  191, series 4) with SUV max equal 16.6. SKELETON No focal hypermetabolic activity to suggest skeletal metastasis. IMPRESSION: 1. Intense metabolic activity associated with the pancreatic head mass. 2. New hypermetabolic hepatic metastasis in RIGHT hepatic lobe. 3. New hypermetabolic nodular metastasis along the body of the gallbladder. 4. No peritoneal metastasis. 5. Patient could not complete the entire PET portion of exam due to severe discomfort. The mid and upper chest was not imaged by PET imaging. No suspicious nodules on CT imaging. No mediastinal lymph nodes identified. Electronically Signed   By: Suzy Bouchard M.D.   On: 09/27/2016 12:03    ASSESSMENT: Stage IV pancreatic cancer  PLAN:   1. Stage IV pancreatic cancer: Imaging, pathology, and Op note reviewed independently confirming stage IV disease with multiple peritoneal implants. PET scan results reviewed independently and reported as above. Patient's CA-19-9 is also mildly elevated at 201. Patient wishes to proceed with palliative chemotherapy. Given patient's rash, gemcitabine has been discontinued. Abraxane okay to use now that his bilirubin and liver enzymes are nearly within normal limits.  Proceed with cycle 2, day 1 of Abraxane today. Return to clinic in 1 weeks for consideration of cycle 2, day 8.. 2. Pain: Continue oxycodone as needed. 3. Elevated liver enzymes: Improving. Secondary to malignancy, monitor. 4. Hyponatremia: Decreased, but stable. Monitor. 5. Rash: Resolved.   Patient expressed understanding and was in agreement with this plan. He also understands that He can call clinic at any time with any questions, concerns, or complaints.   Cancer Staging Pancreatic cancer [facility]) Staging form: Exocrine Pancreas, AJCC 8th Edition - Clinical stage from 09/25/2016: Stage IV (cTX, cNX, pM1) - Signed by Lloyd Huger, MD on 09/25/2016   Jacquelin Hawking, NP   10/27/2016 10:24 AM

## 2016-10-28 LAB — CANCER ANTIGEN 19-9: CA 19-9: 79 U/mL — ABNORMAL HIGH (ref 0–35)

## 2016-11-03 ENCOUNTER — Inpatient Hospital Stay: Payer: BLUE CROSS/BLUE SHIELD

## 2016-11-03 ENCOUNTER — Inpatient Hospital Stay (HOSPITAL_BASED_OUTPATIENT_CLINIC_OR_DEPARTMENT_OTHER): Payer: BLUE CROSS/BLUE SHIELD | Admitting: Oncology

## 2016-11-03 VITALS — BP 107/74 | HR 94 | Temp 96.4°F | Resp 18 | Wt 118.0 lb

## 2016-11-03 DIAGNOSIS — R5381 Other malaise: Secondary | ICD-10-CM

## 2016-11-03 DIAGNOSIS — I4891 Unspecified atrial fibrillation: Secondary | ICD-10-CM | POA: Diagnosis not present

## 2016-11-03 DIAGNOSIS — C259 Malignant neoplasm of pancreas, unspecified: Secondary | ICD-10-CM

## 2016-11-03 DIAGNOSIS — R5383 Other fatigue: Secondary | ICD-10-CM | POA: Diagnosis not present

## 2016-11-03 DIAGNOSIS — C250 Malignant neoplasm of head of pancreas: Secondary | ICD-10-CM | POA: Diagnosis not present

## 2016-11-03 DIAGNOSIS — R1033 Periumbilical pain: Secondary | ICD-10-CM | POA: Diagnosis not present

## 2016-11-03 DIAGNOSIS — Z79899 Other long term (current) drug therapy: Secondary | ICD-10-CM

## 2016-11-03 DIAGNOSIS — E871 Hypo-osmolality and hyponatremia: Secondary | ICD-10-CM

## 2016-11-03 DIAGNOSIS — R21 Rash and other nonspecific skin eruption: Secondary | ICD-10-CM | POA: Diagnosis not present

## 2016-11-03 DIAGNOSIS — R531 Weakness: Secondary | ICD-10-CM | POA: Diagnosis not present

## 2016-11-03 DIAGNOSIS — C787 Secondary malignant neoplasm of liver and intrahepatic bile duct: Secondary | ICD-10-CM | POA: Diagnosis not present

## 2016-11-03 DIAGNOSIS — R748 Abnormal levels of other serum enzymes: Secondary | ICD-10-CM | POA: Diagnosis not present

## 2016-11-03 DIAGNOSIS — J449 Chronic obstructive pulmonary disease, unspecified: Secondary | ICD-10-CM | POA: Diagnosis not present

## 2016-11-03 DIAGNOSIS — F17210 Nicotine dependence, cigarettes, uncomplicated: Secondary | ICD-10-CM

## 2016-11-03 LAB — COMPREHENSIVE METABOLIC PANEL
ALK PHOS: 513 U/L — AB (ref 38–126)
ALT: 56 U/L (ref 17–63)
ANION GAP: 8 (ref 5–15)
AST: 54 U/L — ABNORMAL HIGH (ref 15–41)
Albumin: 3.3 g/dL — ABNORMAL LOW (ref 3.5–5.0)
BILIRUBIN TOTAL: 0.5 mg/dL (ref 0.3–1.2)
BUN: 7 mg/dL (ref 6–20)
CALCIUM: 9 mg/dL (ref 8.9–10.3)
CO2: 29 mmol/L (ref 22–32)
Chloride: 94 mmol/L — ABNORMAL LOW (ref 101–111)
Creatinine, Ser: 0.49 mg/dL — ABNORMAL LOW (ref 0.61–1.24)
GFR calc non Af Amer: >60 mL/min (ref >60)
Glucose, Bld: 120 mg/dL — ABNORMAL HIGH (ref 65–99)
Potassium: 4.5 mmol/L (ref 3.5–5.1)
Sodium: 131 mmol/L — ABNORMAL LOW (ref 135–145)
TOTAL PROTEIN: 6.4 g/dL — AB (ref 6.5–8.1)

## 2016-11-03 LAB — CBC WITH DIFFERENTIAL/PLATELET
Basophils Absolute: 0 10*3/uL (ref 0–0.1)
Basophils Relative: 0 %
Eosinophils Absolute: 0.9 10*3/uL — ABNORMAL HIGH (ref 0–0.7)
Eosinophils Relative: 11 %
HEMATOCRIT: 41.1 % (ref 40.0–52.0)
HEMOGLOBIN: 14.3 g/dL (ref 13.0–18.0)
LYMPHS ABS: 2 10*3/uL (ref 1.0–3.6)
LYMPHS PCT: 23 %
MCH: 31.6 pg (ref 26.0–34.0)
MCHC: 34.8 g/dL (ref 32.0–36.0)
MCV: 90.7 fL (ref 80.0–100.0)
MONOS PCT: 6 %
Monocytes Absolute: 0.5 10*3/uL (ref 0.2–1.0)
Neutro Abs: 5.2 10*3/uL (ref 1.4–6.5)
Neutrophils Relative %: 60 %
Platelets: 495 10*3/uL — ABNORMAL HIGH (ref 150–440)
RBC: 4.53 MIL/uL (ref 4.40–5.90)
RDW: 16.9 % — ABNORMAL HIGH (ref 11.5–14.5)
WBC: 8.7 10*3/uL (ref 3.8–10.6)

## 2016-11-03 MED ORDER — SODIUM CHLORIDE 0.9 % IV SOLN
Freq: Once | INTRAVENOUS | Status: AC
  Administered 2016-11-03: 10:00:00 via INTRAVENOUS
  Filled 2016-11-03: qty 1000

## 2016-11-03 MED ORDER — PACLITAXEL PROTEIN-BOUND CHEMO INJECTION 100 MG
200.0000 mg | Freq: Once | INTRAVENOUS | Status: AC
  Administered 2016-11-03: 200 mg via INTRAVENOUS
  Filled 2016-11-03: qty 40

## 2016-11-03 MED ORDER — PROCHLORPERAZINE MALEATE 10 MG PO TABS
10.0000 mg | ORAL_TABLET | Freq: Once | ORAL | Status: AC
  Administered 2016-11-03: 10 mg via ORAL
  Filled 2016-11-03: qty 1

## 2016-11-03 NOTE — Progress Notes (Signed)
Benjamin Diaz  Telephone:([phone] [phone]  ID: Benjamin Diaz. OB: [birth year]  MR#: [id number]  FYB#:[id number]  Patient Care Team: Patient, [name] as PCP - General (General Practice) [name], MD as Consulting Physician (Oncology) [name], MD as Consulting Physician (Gastroenterology)  CHIEF COMPLAINT: Stage IV pancreatic cancer  INTERVAL HISTORY: Patient returns to clinic today for further evaluation and initiation of cycle 2, day 8 of single agent Abraxane. Patient's rash resolved with discontinue gemcitabine. He continues to have increased weakness and fatigue, but is otherwise tolerating his treatments. He has occasional abdominal pain. He has no neurologic complaints. He denies any fevers. His appetite has improved and he is gaining weight. He has no chest pain or shortness of breath. He denies any nausea, vomiting, constipation, or diarrhea. He has no urinary complaints. Patient offers no further specific complaints today.  REVIEW OF SYSTEMS:   Review of Systems  Constitutional: Positive for malaise/fatigue. Negative for fever and weight loss.  Respiratory: Negative.  Negative for cough and shortness of breath.   Cardiovascular: Negative.  Negative for chest pain and leg swelling.  Gastrointestinal: Positive for abdominal pain. Negative for nausea and vomiting.  Genitourinary: Negative.   Musculoskeletal: Negative.   Skin: Negative for itching and rash.  Neurological: Positive for weakness. Negative for sensory change.  Psychiatric/Behavioral: Negative.  The patient is not nervous/anxious.     As per HPI. Otherwise, a complete review of systems is negative.  PAST MEDICAL HISTORY: Past Medical History:  Diagnosis Date  . Arthritis   . Asthma   . Atrial fibrillation ([place])   . Closed left ankle fracture age 56  . COPD (chronic obstructive pulmonary disease) ([place])   . Dysrhythmia   . Pneumonia 2011  . Right shoulder  injury 09/27/2016  . Sciatica    left leg pinched nerve numb at times about once a year    PAST SURGICAL HISTORY: Past Surgical History:  Procedure Laterality Date  . BILIARY STENT PLACEMENT N/A 09/01/2016   Procedure: BILIARY STENT PLACEMENT;  Surgeon: [name], MD;  [facility];  Service: Endoscopy;  Laterality: N/A;  . DIAGNOSTIC LAPAROSCOPIC LIVER BIOPSY  09/16/2016   Procedure: DIAGNOSTIC LAPAROSCOPIC LIVER BIOPSY;  Surgeon: [name], MD;  Location: [place];  Service: General;;  . ERCP N/A 07/09/2016   Procedure: ENDOSCOPIC RETROGRADE CHOLANGIOPANCREATOGRAPHY (ERCP);  Surgeon: [name], MD;  Location: [facility];  Service: Endoscopy;  Laterality: N/A;  . ERCP N/A 09/01/2016   Procedure: ENDOSCOPIC RETROGRADE CHOLANGIOPANCREATOGRAPHY (ERCP);  Surgeon: [name], MD;  Location: [facility];  Service: Endoscopy;  Laterality: N/A;  . ESOPHAGOGASTRODUODENOSCOPY (EGD) WITH PROPOFOL N/A 09/01/2016   Procedure: ESOPHAGOGASTRODUODENOSCOPY (EGD) WITH PROPOFOL;  Surgeon: [name], MD;  [facility];  Service: Gastroenterology;  Laterality: N/A;  . EUS N/A 09/01/2016   Procedure: ESOPHAGEAL ENDOSCOPIC ULTRASOUND (EUS) RADIAL;  Surgeon: [name], MD;  [facility];  Service: Endoscopy;  Laterality: N/A;  . FINE NEEDLE ASPIRATION N/A 09/01/2016   Procedure: FINE NEEDLE ASPIRATION (FNA) RADIAL;  Surgeon: [name], MD;  [facility];  Service: Endoscopy;  Laterality: N/A;  . FOREIGN BODY REMOVAL N/A 09/01/2016   Procedure: FOREIGN BODY REMOVAL;  Surgeon: [name], MD;  [facility];  Service: Gastroenterology;  Laterality: N/A;  . KNEE SURGERY Right 2002   arthroscopy  . SHOULDER SURGERY Right 2002    FAMILY HISTORY: Family History  Problem Relation Age of Onset  . Heart disease Father  ADVANCED DIRECTIVES (Y/N):  N  HEALTH MAINTENANCE: Social History  Substance Use Topics  . Smoking status: Current Every  Day Smoker    Packs/day: 0.50    Years: 40.00    Types: Cigarettes  . Smokeless tobacco: Never Used  . Alcohol use 4.2 oz/week    7 Cans of beer per week     Comment: last drink 5 weeeks ago  april 2018     Colonoscopy:  PAP:  Bone density:  Lipid panel:  Allergies  Allergen Reactions  . No Known Allergies     Current Outpatient Prescriptions  Medication Sig Dispense Refill  . albuterol (PROVENTIL HFA;VENTOLIN HFA) 108 (90 Base) MCG/ACT inhaler Inhale 1-2 puffs into the lungs every 6 (six) hours as needed for wheezing or shortness of breath. 1 Inhaler 6  . Ipratropium-Albuterol (COMBIVENT RESPIMAT) 20-100 MCG/ACT AERS respimat Inhale 1 puff into the lungs 4 (four) times daily.    Marland Kitchen lidocaine-prilocaine (EMLA) cream Apply to affected area once (Patient not taking: Reported on 11/03/2016) 30 g 3   No current facility-administered medications for this visit.     OBJECTIVE: Vitals:   11/03/16 0946  BP: 107/74  Pulse: 94  Resp: 18  Temp: (!) 96.4 F (35.8 C)     Body mass index is 16 kg/m.    ECOG FS:1 - Symptomatic but completely ambulatory  General: Thin, no acute distress. Eyes: Pink conjunctiva, anicteric sclera. Lungs: Clear to auscultation bilaterally. Heart: Regular rate and rhythm. No rubs, murmurs, or gallops. Abdomen: Soft, nontender, nondistended. No organomegaly noted, normoactive bowel sounds. Musculoskeletal: No edema, cyanosis, or clubbing. Neuro: Alert, answering all questions appropriately. Cranial nerves grossly intact. Skin: Maculopapular rash noted on torso. Psych: Normal affect.   LAB RESULTS:  Lab Results  Component Value Date   NA 131 (L) 11/03/2016   K 4.5 11/03/2016   CL 94 (L) 11/03/2016   CO2 29 11/03/2016   GLUCOSE 120 (H) 11/03/2016   BUN 7 11/03/2016   CREATININE 0.49 (L) 11/03/2016   CALCIUM 9.0 11/03/2016   PROT 6.4 (L) 11/03/2016   ALBUMIN 3.3 (L) 11/03/2016   AST 54 (H) 11/03/2016   ALT 56 11/03/2016   ALKPHOS 513 (H)  11/03/2016   BILITOT 0.5 11/03/2016   GFRNONAA >60 11/03/2016   GFRAA >60 11/03/2016    Lab Results  Component Value Date   WBC 8.7 11/03/2016   NEUTROABS 5.2 11/03/2016   HGB 14.3 11/03/2016   HCT 41.1 11/03/2016   MCV 90.7 11/03/2016   PLT 495 (H) 11/03/2016      STUDIES: No results found.  ASSESSMENT: Stage IV pancreatic cancer  PLAN:   1. Stage IV pancreatic cancer: Imaging, pathology, and Op note reviewed independently confirming stage IV disease with multiple peritoneal implants. PET scan results reviewed independently. Patient's CA-19-9 is now trending down to 56. Patient wishes to proceed with palliative chemotherapy. Given patient's rash, gemcitabine has been discontinued. Abraxane okay to use now that his bilirubin and liver enzymes are within normal limits.  Proceed with cycle 2, day 8 of Abraxane today. Return to clinic in 1 week for consideration of cycle 2, day 8. 2. Pain: Continue oxycodone as needed. 3. Elevated liver enzymes: Improving. Secondary to malignancy, monitor. 4. Hyponatremia: Decreased, but stable. Monitor. 5. Rash: Discontinue gemcitabine as above.  Patient expressed understanding and was in agreement with this plan. He also understands that He can call clinic at any time with any questions, concerns, or complaints.   Cancer Staging Pancreatic cancer (  [place]) Staging form: Exocrine Pancreas, AJCC 8th Edition - Clinical stage from 09/25/2016: Stage IV (cTX, cNX, pM1) - Signed by Lloyd Huger, MD on 09/25/2016   Lloyd Huger, MD   11/06/2016 7:04 AM

## 2016-11-04 LAB — CANCER ANTIGEN 19-9: CAN 19-9: 56 U/mL — AB (ref 0–35)

## 2016-11-08 NOTE — Progress Notes (Signed)
[place]  Telephone:([phone] [phone]  ID: Benjamin Diaz. OB: 12/25/1960  MR#: [id number]  JQB#:[id number]  Patient Care Team: Patient, [name] as PCP - General (General Practice) [name], MD as Consulting Physician (Oncology) [name], MD as Consulting Physician (Gastroenterology)  CHIEF COMPLAINT: Stage IV pancreatic cancer  INTERVAL HISTORY: Patient returns to clinic today for further evaluation and consideration of cycle 2, day 15 single agent Abraxane. Patient's rash resolved with the discontinuation of gemcitabine. He continues to have increased weakness and fatigue, but is otherwise tolerating his treatments. He does not have any further abdominal pain and is no longer taking narcotics. He has no neurologic complaints. He denies any fevers. His appetite has improved and he is gaining weight. He has no chest pain or shortness of breath. He denies any nausea, vomiting, constipation, or diarrhea. He has no urinary complaints. Patient offers no further specific complaints today.  REVIEW OF SYSTEMS:   Review of Systems  Constitutional: Positive for malaise/fatigue. Negative for fever and weight loss.  Respiratory: Negative.  Negative for cough and shortness of breath.   Cardiovascular: Negative.  Negative for chest pain and leg swelling.  Gastrointestinal: Negative for abdominal pain, nausea and vomiting.  Genitourinary: Negative.   Musculoskeletal: Negative.   Skin: Negative for itching and rash.  Neurological: Positive for weakness. Negative for sensory change.  Psychiatric/Behavioral: Negative.  The patient is not nervous/anxious.     As per HPI. Otherwise, a complete review of systems is negative.  PAST MEDICAL HISTORY: Past Medical History:  Diagnosis Date  . Arthritis   . Asthma   . Atrial fibrillation ([place])   . Closed left ankle fracture age 54  . COPD (chronic obstructive pulmonary disease) ([place])   . Dysrhythmia    . Pneumonia 2011  . Right shoulder injury 09/27/2016  . Sciatica    left leg pinched nerve numb at times about once a year    PAST SURGICAL HISTORY: Past Surgical History:  Procedure Laterality Date  . BILIARY STENT PLACEMENT N/A 09/01/2016   Procedure: BILIARY STENT PLACEMENT;  Surgeon: [name], MD;  [facility];  Service: Endoscopy;  Laterality: N/A;  . DIAGNOSTIC LAPAROSCOPIC LIVER BIOPSY  09/16/2016   Procedure: DIAGNOSTIC LAPAROSCOPIC LIVER BIOPSY;  Surgeon: [name], MD;  Location: [place];  Service: General;;  . ERCP N/A 07/09/2016   Procedure: ENDOSCOPIC RETROGRADE CHOLANGIOPANCREATOGRAPHY (ERCP);  Surgeon: [name], MD;  Location: [facility];  Service: Endoscopy;  Laterality: N/A;  . ERCP N/A 09/01/2016   Procedure: ENDOSCOPIC RETROGRADE CHOLANGIOPANCREATOGRAPHY (ERCP);  Surgeon: [name], MD;  Location: [facility];  Service: Endoscopy;  Laterality: N/A;  . ESOPHAGOGASTRODUODENOSCOPY (EGD) WITH PROPOFOL N/A 09/01/2016   Procedure: ESOPHAGOGASTRODUODENOSCOPY (EGD) WITH PROPOFOL;  Surgeon: [name], MD;  [facility];  Service: Gastroenterology;  Laterality: N/A;  . EUS N/A 09/01/2016   Procedure: ESOPHAGEAL ENDOSCOPIC ULTRASOUND (EUS) RADIAL;  Surgeon: [name], MD;  [facility];  Service: Endoscopy;  Laterality: N/A;  . FINE NEEDLE ASPIRATION N/A 09/01/2016   Procedure: FINE NEEDLE ASPIRATION (FNA) RADIAL;  Surgeon: [name], MD;  [facility];  Service: Endoscopy;  Laterality: N/A;  . FOREIGN BODY REMOVAL N/A 09/01/2016   Procedure: FOREIGN BODY REMOVAL;  Surgeon: [name], MD;  [facility];  Service: Gastroenterology;  Laterality: N/A;  . KNEE SURGERY Right 2002   arthroscopy  . SHOULDER SURGERY Right 2002    FAMILY HISTORY: Family History  Problem Relation Age of  Onset  . Heart disease Father     ADVANCED DIRECTIVES (Y/N):  N  HEALTH MAINTENANCE: Social History  Substance Use  Topics  . Smoking status: Current Every Day Smoker    Packs/day: 0.50    Years: 40.00    Types: Cigarettes  . Smokeless tobacco: Never Used  . Alcohol use 4.2 oz/week    7 Cans of beer per week     Comment: last drink 5 weeeks ago  april 2018     Colonoscopy:  PAP:  Bone density:  Lipid panel:  Allergies  Allergen Reactions  . No Known Allergies     Current Outpatient Prescriptions  Medication Sig Dispense Refill  . albuterol (PROVENTIL HFA;VENTOLIN HFA) 108 (90 Base) MCG/ACT inhaler Inhale 1-2 puffs into the lungs every 6 (six) hours as needed for wheezing or shortness of breath. 1 Inhaler 6  . Ipratropium-Albuterol (COMBIVENT RESPIMAT) 20-100 MCG/ACT AERS respimat Inhale 1 puff into the lungs 4 (four) times daily.    Marland Kitchen lidocaine-prilocaine (EMLA) cream Apply to affected area once (Patient not taking: Reported on 11/03/2016) 30 g 3   No current facility-administered medications for this visit.     OBJECTIVE: Vitals:   11/10/16 0951  BP: 107/71  Pulse: 93  Resp: 18  Temp: (!) 96 F (35.6 C)     Body mass index is 16.15 kg/m.    ECOG FS:1 - Symptomatic but completely ambulatory  General: Thin, no acute distress. Eyes: Pink conjunctiva, anicteric sclera. Lungs: Clear to auscultation bilaterally. Heart: Regular rate and rhythm. No rubs, murmurs, or gallops. Abdomen: Soft, nontender, nondistended. No organomegaly noted, normoactive bowel sounds. Musculoskeletal: No edema, cyanosis, or clubbing. Neuro: Alert, answering all questions appropriately. Cranial nerves grossly intact. Skin: Maculopapular rash noted on torso. Psych: Normal affect.   LAB RESULTS:  Lab Results  Component Value Date   NA 131 (L) 11/10/2016   K 4.5 11/10/2016   CL 93 (L) 11/10/2016   CO2 31 11/10/2016   GLUCOSE 103 (H) 11/10/2016   BUN 8 11/10/2016   CREATININE 0.50 (L) 11/10/2016   CALCIUM 9.1 11/10/2016   PROT 6.4 (L) 11/10/2016   ALBUMIN 3.3 (L) 11/10/2016   AST 52 (H) 11/10/2016    ALT 51 11/10/2016   ALKPHOS 448 (H) 11/10/2016   BILITOT 0.5 11/10/2016   GFRNONAA >60 11/10/2016   GFRAA >60 11/10/2016    Lab Results  Component Value Date   WBC 7.0 11/10/2016   NEUTROABS 3.3 11/10/2016   HGB 14.3 11/10/2016   HCT 40.3 11/10/2016   MCV 90.0 11/10/2016   PLT 478 (H) 11/10/2016      STUDIES: No results found.  ASSESSMENT: Stage IV pancreatic cancer  PLAN:   1. Stage IV pancreatic cancer: Imaging, pathology, and Op note reviewed independently confirming stage IV disease with multiple peritoneal implants. PET scan results reviewed independently. Patient's CA-19-9 is now trending down to 56. Patient wishes to proceed with palliative chemotherapy. Given patient's rash, gemcitabine has been discontinued. Abraxane okay to use now that his bilirubin and liver enzymes are within normal limits.  Proceed with cycle 2, day 15 of Abraxane today. Return to clinic in 2 weeks for consideration of cycle 3, day 1. Will consider reimaging at the conclusion of cycle 3. 2. Pain: Resolved. Patient reports he is no longer taking oxycodone.  3. Elevated liver enzymes: Improving. Secondary to malignancy, monitor. 4. Hyponatremia: Decreased, but stable. Monitor. 5. Rash: Resolved. Discontinue gemcitabine as above.  Patient expressed understanding and was in agreement  with this plan. He also understands that He can call clinic at any time with any questions, concerns, or complaints.   Cancer Staging Pancreatic cancer [facility]) Staging form: Exocrine Pancreas, AJCC 8th Edition - Clinical stage from 09/25/2016: Stage IV (cTX, cNX, pM1) - Signed by Lloyd Huger, MD on 09/25/2016   Lloyd Huger, MD   11/12/2016 9:55 AM

## 2016-11-10 ENCOUNTER — Inpatient Hospital Stay (HOSPITAL_BASED_OUTPATIENT_CLINIC_OR_DEPARTMENT_OTHER): Payer: BLUE CROSS/BLUE SHIELD | Admitting: Oncology

## 2016-11-10 ENCOUNTER — Inpatient Hospital Stay: Payer: BLUE CROSS/BLUE SHIELD

## 2016-11-10 VITALS — BP 107/71 | HR 93 | Temp 96.0°F | Resp 18 | Wt 119.1 lb

## 2016-11-10 DIAGNOSIS — C259 Malignant neoplasm of pancreas, unspecified: Secondary | ICD-10-CM

## 2016-11-10 DIAGNOSIS — R531 Weakness: Secondary | ICD-10-CM | POA: Diagnosis not present

## 2016-11-10 DIAGNOSIS — I4891 Unspecified atrial fibrillation: Secondary | ICD-10-CM | POA: Diagnosis not present

## 2016-11-10 DIAGNOSIS — C787 Secondary malignant neoplasm of liver and intrahepatic bile duct: Secondary | ICD-10-CM | POA: Diagnosis not present

## 2016-11-10 DIAGNOSIS — R748 Abnormal levels of other serum enzymes: Secondary | ICD-10-CM | POA: Diagnosis not present

## 2016-11-10 DIAGNOSIS — Z79899 Other long term (current) drug therapy: Secondary | ICD-10-CM | POA: Diagnosis not present

## 2016-11-10 DIAGNOSIS — R5383 Other fatigue: Secondary | ICD-10-CM

## 2016-11-10 DIAGNOSIS — C250 Malignant neoplasm of head of pancreas: Secondary | ICD-10-CM

## 2016-11-10 DIAGNOSIS — E871 Hypo-osmolality and hyponatremia: Secondary | ICD-10-CM

## 2016-11-10 DIAGNOSIS — J449 Chronic obstructive pulmonary disease, unspecified: Secondary | ICD-10-CM | POA: Diagnosis not present

## 2016-11-10 DIAGNOSIS — R5381 Other malaise: Secondary | ICD-10-CM | POA: Diagnosis not present

## 2016-11-10 DIAGNOSIS — F17210 Nicotine dependence, cigarettes, uncomplicated: Secondary | ICD-10-CM

## 2016-11-10 LAB — CBC WITH DIFFERENTIAL/PLATELET
Basophils Absolute: 0.1 10*3/uL (ref 0–0.1)
Basophils Relative: 2 %
Eosinophils Absolute: 1.2 10*3/uL — ABNORMAL HIGH (ref 0–0.7)
Eosinophils Relative: 18 %
HEMATOCRIT: 40.3 % (ref 40.0–52.0)
HEMOGLOBIN: 14.3 g/dL (ref 13.0–18.0)
LYMPHS ABS: 1.8 10*3/uL (ref 1.0–3.6)
LYMPHS PCT: 26 %
MCH: 31.9 pg (ref 26.0–34.0)
MCHC: 35.4 g/dL (ref 32.0–36.0)
MCV: 90 fL (ref 80.0–100.0)
MONOS PCT: 8 %
Monocytes Absolute: 0.5 10*3/uL (ref 0.2–1.0)
NEUTROS ABS: 3.3 10*3/uL (ref 1.4–6.5)
NEUTROS PCT: 46 %
Platelets: 478 10*3/uL — ABNORMAL HIGH (ref 150–440)
RBC: 4.47 MIL/uL (ref 4.40–5.90)
RDW: 16.7 % — ABNORMAL HIGH (ref 11.5–14.5)
WBC: 7 10*3/uL (ref 3.8–10.6)

## 2016-11-10 LAB — COMPREHENSIVE METABOLIC PANEL
ALBUMIN: 3.3 g/dL — AB (ref 3.5–5.0)
ALK PHOS: 448 U/L — AB (ref 38–126)
ALT: 51 U/L (ref 17–63)
ANION GAP: 7 (ref 5–15)
AST: 52 U/L — ABNORMAL HIGH (ref 15–41)
BILIRUBIN TOTAL: 0.5 mg/dL (ref 0.3–1.2)
BUN: 8 mg/dL (ref 6–20)
CALCIUM: 9.1 mg/dL (ref 8.9–10.3)
CO2: 31 mmol/L (ref 22–32)
Chloride: 93 mmol/L — ABNORMAL LOW (ref 101–111)
Creatinine, Ser: 0.5 mg/dL — ABNORMAL LOW (ref 0.61–1.24)
GLUCOSE: 103 mg/dL — AB (ref 65–99)
Potassium: 4.5 mmol/L (ref 3.5–5.1)
Sodium: 131 mmol/L — ABNORMAL LOW (ref 135–145)
TOTAL PROTEIN: 6.4 g/dL — AB (ref 6.5–8.1)

## 2016-11-10 MED ORDER — PROCHLORPERAZINE MALEATE 10 MG PO TABS
10.0000 mg | ORAL_TABLET | Freq: Once | ORAL | Status: AC
  Administered 2016-11-10: 10 mg via ORAL
  Filled 2016-11-10: qty 1

## 2016-11-10 MED ORDER — PACLITAXEL PROTEIN-BOUND CHEMO INJECTION 100 MG
200.0000 mg | Freq: Once | INTRAVENOUS | Status: AC
  Administered 2016-11-10: 200 mg via INTRAVENOUS
  Filled 2016-11-10: qty 40

## 2016-11-10 MED ORDER — SODIUM CHLORIDE 0.9 % IV SOLN
Freq: Once | INTRAVENOUS | Status: AC
  Administered 2016-11-10: 11:00:00 via INTRAVENOUS
  Filled 2016-11-10: qty 1000

## 2016-11-10 NOTE — Progress Notes (Signed)
Patient is here for follow up, he is doing ok no complaints.

## 2016-11-21 NOTE — Progress Notes (Signed)
[place]  [phone] [phone]  ID: Benjamin Diaz. OB: Jun 05, 1960  MR#: [id number]  VOJ#:[id number]  Patient Care Team: Patient, [name] as PCP - General (General Practice) [name], MD as Consulting Physician (Oncology) [name], MD as Consulting Physician (Gastroenterology)  CHIEF COMPLAINT: Stage IV pancreatic cancer  INTERVAL HISTORY: Patient returns to clinic today for further evaluation and consideration of cycle 3, day 1 single agent Abraxane. He continues to have increased weakness and fatigue, but is otherwise tolerating his treatments. He has some mild peripheral edema, which resolves with elevation. He does not have any further abdominal pain and is no longer taking narcotics. He has no neurologic complaints. He denies any fevers. His appetite has improved and he is gaining weight. He has no chest pain or shortness of breath. He denies any nausea, vomiting, constipation, or diarrhea. He has no urinary complaints. Patient offers no further specific complaints today.  REVIEW OF SYSTEMS:   Review of Systems  Constitutional: Positive for malaise/fatigue. Negative for fever and weight loss.  Respiratory: Negative.  Negative for cough and shortness of breath.   Cardiovascular: Positive for leg swelling. Negative for chest pain.  Gastrointestinal: Negative for abdominal pain, nausea and vomiting.  Genitourinary: Negative.   Musculoskeletal: Negative.   Skin: Negative for itching and rash.  Neurological: Positive for weakness. Negative for sensory change.  Psychiatric/Behavioral: Negative.  The patient is not nervous/anxious.     As per HPI. Otherwise, a complete review of systems is negative.  PAST MEDICAL HISTORY: Past Medical History:  Diagnosis Date  . Arthritis   . Asthma   . Atrial fibrillation ([place])   . Closed left ankle fracture age 56  . COPD (chronic obstructive pulmonary disease) ([place])   . Dysrhythmia     . Pneumonia 2011  . Right shoulder injury 09/27/2016  . Sciatica    left leg pinched nerve numb at times about once a year    PAST SURGICAL HISTORY: Past Surgical History:  Procedure Laterality Date  . BILIARY STENT PLACEMENT N/A 09/01/2016   Procedure: BILIARY STENT PLACEMENT;  Surgeon: [name], MD;  [facility];  Service: Endoscopy;  Laterality: N/A;  . DIAGNOSTIC LAPAROSCOPIC LIVER BIOPSY  09/16/2016   Procedure: DIAGNOSTIC LAPAROSCOPIC LIVER BIOPSY;  Surgeon: [name], MD;  Location: [place];  Service: General;;  . ERCP N/A 07/09/2016   Procedure: ENDOSCOPIC RETROGRADE CHOLANGIOPANCREATOGRAPHY (ERCP);  Surgeon: [name], MD;  Location: [facility];  Service: Endoscopy;  Laterality: N/A;  . ERCP N/A 09/01/2016   Procedure: ENDOSCOPIC RETROGRADE CHOLANGIOPANCREATOGRAPHY (ERCP);  Surgeon: [name], MD;  Location[facility];  Service: Endoscopy;  Laterality: N/A;  . ESOPHAGOGASTRODUODENOSCOPY (EGD) WITH PROPOFOL N/A 09/01/2016   Procedure: ESOPHAGOGASTRODUODENOSCOPY (EGD) WITH PROPOFOL;  Surgeon: [name], MD;  [facility];  Service: Gastroenterology;  Laterality: N/A;  . EUS N/A 09/01/2016   Procedure: ESOPHAGEAL ENDOSCOPIC ULTRASOUND (EUS) RADIAL;  Surgeon: [name], MD;  [facility];  Service: Endoscopy;  Laterality: N/A;  . FINE NEEDLE ASPIRATION N/A 09/01/2016   Procedure: FINE NEEDLE ASPIRATION (FNA) RADIAL;  Surgeon: [name], MD;  [facility];  Service: Endoscopy;  Laterality: N/A;  . FOREIGN BODY REMOVAL N/A 09/01/2016   Procedure: FOREIGN BODY REMOVAL;  Surgeon: [name], MD;  [facility];  Service: Gastroenterology;  Laterality: N/A;  . KNEE SURGERY Right 2002   arthroscopy  . SHOULDER SURGERY Right 2002    FAMILY HISTORY: Family History  Problem Relation  Age of Onset  . Heart disease Father     ADVANCED DIRECTIVES (Y/N):  N  HEALTH MAINTENANCE: Social History  Substance Use  Topics  . Smoking status: Current Every Day Smoker    Packs/day: 0.50    Years: 40.00    Types: Cigarettes  . Smokeless tobacco: Never Used  . Alcohol use 4.2 oz/week    7 Cans of beer per week     Comment: last drink 5 weeeks ago  april 2018     Colonoscopy:  PAP:  Bone density:  Lipid panel:  Allergies  Allergen Reactions  . No Known Allergies     Current Outpatient Prescriptions  Medication Sig Dispense Refill  . albuterol (PROVENTIL HFA;VENTOLIN HFA) 108 (90 Base) MCG/ACT inhaler Inhale 1-2 puffs into the lungs every 6 (six) hours as needed for wheezing or shortness of breath. 1 Inhaler 6  . Ipratropium-Albuterol (COMBIVENT RESPIMAT) 20-100 MCG/ACT AERS respimat Inhale 1 puff into the lungs 4 (four) times daily.    Marland Kitchen lidocaine-prilocaine (EMLA) cream Apply to affected area once (Patient not taking: Reported on 11/03/2016) 30 g 3   No current facility-administered medications for this visit.     OBJECTIVE: Vitals:   11/24/16 1010  BP: 112/74  Pulse: 86  Resp: 20  Temp: (!) 96.6 F (35.9 C)     Body mass index is 16.37 kg/m.    ECOG FS:1 - Symptomatic but completely ambulatory  General: Thin, no acute distress. Eyes: Pink conjunctiva, anicteric sclera. Lungs: Clear to auscultation bilaterally. Heart: Regular rate and rhythm. No rubs, murmurs, or gallops. Abdomen: Soft, nontender, nondistended. No organomegaly noted, normoactive bowel sounds. Musculoskeletal: No edema, cyanosis, or clubbing. Neuro: Alert, answering all questions appropriately. Cranial nerves grossly intact. Skin: Maculopapular rash noted on torso. Psych: Normal affect.   LAB RESULTS:  Lab Results  Component Value Date   NA 131 (L) 11/24/2016   K 4.4 11/24/2016   CL 94 (L) 11/24/2016   CO2 30 11/24/2016   GLUCOSE 115 (H) 11/24/2016   BUN 8 11/24/2016   CREATININE 0.48 (L) 11/24/2016   CALCIUM 9.1 11/24/2016   PROT 6.5 11/24/2016   ALBUMIN 3.4 (L) 11/24/2016   AST 53 (H) 11/24/2016    ALT 59 11/24/2016   ALKPHOS 504 (H) 11/24/2016   BILITOT 0.4 11/24/2016   GFRNONAA >60 11/24/2016   GFRAA >60 11/24/2016    Lab Results  Component Value Date   WBC 8.7 11/24/2016   NEUTROABS 4.7 11/24/2016   HGB 14.7 11/24/2016   HCT 42.2 11/24/2016   MCV 91.0 11/24/2016   PLT 503 (H) 11/24/2016      STUDIES: No results found.  ASSESSMENT: Stage IV pancreatic cancer  PLAN:   1. Stage IV pancreatic cancer: Imaging, pathology, and Op note reviewed independently confirming stage IV disease with multiple peritoneal implants. PET scan results reviewed independently. Patient's CA-19-9 is now trending down to 56. Patient wishes to proceed with palliative chemotherapy. Given patient's rash, gemcitabine was discontinued. Abraxane okay to use now that his bilirubin and liver enzymes are within normal limits.  Proceed with cycle 3, day 1 of Abraxane today. Return to clinic in 1 week for consideration of cycle 3, day 8. Will consider reimaging at the conclusion of cycle 3. 2. Pain: Resolved. Patient reports he is no longer taking oxycodone.  3. Elevated liver enzymes: Improving. Secondary to malignancy, monitor. 4. Hyponatremia: Decreased, but stable. Monitor. 5. Rash: Resolved. Discontinue gemcitabine as above. 6. Peripheral edema: Continue elevation nightly.  Patient  expressed understanding and was in agreement with this plan. He also understands that He can call clinic at any time with any questions, concerns, or complaints.   Cancer Staging Pancreatic cancer [facility]) Staging form: Exocrine Pancreas, AJCC 8th Edition - Clinical stage from 09/25/2016: Stage IV (cTX, cNX, pM1) - Signed by Lloyd Huger, MD on 09/25/2016   Lloyd Huger, MD   11/24/2016 1:25 PM

## 2016-11-24 ENCOUNTER — Inpatient Hospital Stay: Payer: BLUE CROSS/BLUE SHIELD

## 2016-11-24 ENCOUNTER — Inpatient Hospital Stay (HOSPITAL_BASED_OUTPATIENT_CLINIC_OR_DEPARTMENT_OTHER): Payer: BLUE CROSS/BLUE SHIELD | Admitting: Oncology

## 2016-11-24 VITALS — BP 112/74 | HR 86 | Temp 96.6°F | Resp 20 | Wt 120.7 lb

## 2016-11-24 DIAGNOSIS — C259 Malignant neoplasm of pancreas, unspecified: Secondary | ICD-10-CM

## 2016-11-24 DIAGNOSIS — R600 Localized edema: Secondary | ICD-10-CM | POA: Diagnosis not present

## 2016-11-24 DIAGNOSIS — Z79899 Other long term (current) drug therapy: Secondary | ICD-10-CM

## 2016-11-24 DIAGNOSIS — C787 Secondary malignant neoplasm of liver and intrahepatic bile duct: Secondary | ICD-10-CM | POA: Diagnosis not present

## 2016-11-24 DIAGNOSIS — F17210 Nicotine dependence, cigarettes, uncomplicated: Secondary | ICD-10-CM

## 2016-11-24 DIAGNOSIS — J449 Chronic obstructive pulmonary disease, unspecified: Secondary | ICD-10-CM

## 2016-11-24 DIAGNOSIS — R5381 Other malaise: Secondary | ICD-10-CM | POA: Diagnosis not present

## 2016-11-24 DIAGNOSIS — R531 Weakness: Secondary | ICD-10-CM

## 2016-11-24 DIAGNOSIS — E871 Hypo-osmolality and hyponatremia: Secondary | ICD-10-CM

## 2016-11-24 DIAGNOSIS — I4891 Unspecified atrial fibrillation: Secondary | ICD-10-CM

## 2016-11-24 DIAGNOSIS — R748 Abnormal levels of other serum enzymes: Secondary | ICD-10-CM | POA: Diagnosis not present

## 2016-11-24 DIAGNOSIS — R5383 Other fatigue: Secondary | ICD-10-CM

## 2016-11-24 DIAGNOSIS — R21 Rash and other nonspecific skin eruption: Secondary | ICD-10-CM | POA: Diagnosis not present

## 2016-11-24 DIAGNOSIS — C250 Malignant neoplasm of head of pancreas: Secondary | ICD-10-CM | POA: Diagnosis not present

## 2016-11-24 LAB — CBC WITH DIFFERENTIAL/PLATELET
BASOS PCT: 1 %
Basophils Absolute: 0.1 10*3/uL (ref 0–0.1)
EOS ABS: 0.5 10*3/uL (ref 0–0.7)
Eosinophils Relative: 5 %
HEMATOCRIT: 42.2 % (ref 40.0–52.0)
HEMOGLOBIN: 14.7 g/dL (ref 13.0–18.0)
LYMPHS ABS: 2.2 10*3/uL (ref 1.0–3.6)
Lymphocytes Relative: 25 %
MCH: 31.7 pg (ref 26.0–34.0)
MCHC: 34.8 g/dL (ref 32.0–36.0)
MCV: 91 fL (ref 80.0–100.0)
MONO ABS: 1.2 10*3/uL — AB (ref 0.2–1.0)
MONOS PCT: 14 %
NEUTROS PCT: 55 %
Neutro Abs: 4.7 10*3/uL (ref 1.4–6.5)
Platelets: 503 10*3/uL — ABNORMAL HIGH (ref 150–440)
RBC: 4.64 MIL/uL (ref 4.40–5.90)
RDW: 17 % — AB (ref 11.5–14.5)
WBC: 8.7 10*3/uL (ref 3.8–10.6)

## 2016-11-24 LAB — COMPREHENSIVE METABOLIC PANEL
ALK PHOS: 504 U/L — AB (ref 38–126)
ALT: 59 U/L (ref 17–63)
ANION GAP: 7 (ref 5–15)
AST: 53 U/L — ABNORMAL HIGH (ref 15–41)
Albumin: 3.4 g/dL — ABNORMAL LOW (ref 3.5–5.0)
BILIRUBIN TOTAL: 0.4 mg/dL (ref 0.3–1.2)
BUN: 8 mg/dL (ref 6–20)
CALCIUM: 9.1 mg/dL (ref 8.9–10.3)
CO2: 30 mmol/L (ref 22–32)
Chloride: 94 mmol/L — ABNORMAL LOW (ref 101–111)
Creatinine, Ser: 0.48 mg/dL — ABNORMAL LOW (ref 0.61–1.24)
GFR calc non Af Amer: >60 mL/min (ref >60)
Glucose, Bld: 115 mg/dL — ABNORMAL HIGH (ref 65–99)
POTASSIUM: 4.4 mmol/L (ref 3.5–5.1)
SODIUM: 131 mmol/L — AB (ref 135–145)
TOTAL PROTEIN: 6.5 g/dL (ref 6.5–8.1)

## 2016-11-24 MED ORDER — SODIUM CHLORIDE 0.9 % IV SOLN
Freq: Once | INTRAVENOUS | Status: AC
  Administered 2016-11-24: 11:00:00 via INTRAVENOUS
  Filled 2016-11-24: qty 1000

## 2016-11-24 MED ORDER — PROCHLORPERAZINE MALEATE 10 MG PO TABS
10.0000 mg | ORAL_TABLET | Freq: Once | ORAL | Status: AC
  Administered 2016-11-24: 10 mg via ORAL
  Filled 2016-11-24: qty 1

## 2016-11-24 MED ORDER — PACLITAXEL PROTEIN-BOUND CHEMO INJECTION 100 MG
200.0000 mg | Freq: Once | INTRAVENOUS | Status: AC
  Administered 2016-11-24: 200 mg via INTRAVENOUS
  Filled 2016-11-24: qty 40

## 2016-11-24 NOTE — Progress Notes (Signed)
Patient here today for follow up and treatment consideration regarding pancreatic cancer. Patient reports decreased appetite and swelling to bilateral legs and feet.

## 2016-11-25 LAB — CANCER ANTIGEN 19-9: CAN 19-9: 29 U/mL (ref 0–35)

## 2016-11-26 NOTE — Progress Notes (Signed)
[place]  [phone] [phone]  ID: Benjamin Diaz. OB: 1960-07-23  MR#: [id number]  WPY#:[id number]  Patient Care Team: Patient, [name] as PCP - General (General Practice) [name], MD as Consulting Physician (Oncology) [name], MD as Consulting Physician (Gastroenterology)  CHIEF COMPLAINT: Stage IV pancreatic cancer  INTERVAL HISTORY: Patient returns to clinic today for further evaluation and consideration of cycle 3, day 8 single agent Abraxane. He continues to have increased weakness and fatigue, but is otherwise tolerating his treatments. He has some mild peripheral edema, which resolves with elevation. He does not have any further abdominal pain and is no longer taking narcotics. He has no neurologic complaints. He denies any fevers. His appetite has improved and he is gaining weight. He has no chest pain or shortness of breath. He denies any nausea, vomiting, constipation, or diarrhea. He has no urinary complaints. Patient offers no further specific complaints today.  REVIEW OF SYSTEMS:   Review of Systems  Constitutional: Positive for malaise/fatigue. Negative for fever and weight loss.  Respiratory: Negative.  Negative for cough and shortness of breath.   Cardiovascular: Positive for leg swelling. Negative for chest pain.  Gastrointestinal: Negative for abdominal pain, nausea and vomiting.  Genitourinary: Negative.   Musculoskeletal: Negative.   Skin: Negative for itching and rash.  Neurological: Positive for weakness. Negative for sensory change.  Psychiatric/Behavioral: Negative.  The patient is not nervous/anxious.     As per HPI. Otherwise, a complete review of systems is negative.  PAST MEDICAL HISTORY: Past Medical History:  Diagnosis Date  . Arthritis   . Asthma   . Atrial fibrillation ([place])   . Closed left ankle fracture age 37  . COPD (chronic obstructive pulmonary disease) ([place])   . Dysrhythmia     . Pneumonia 2011  . Right shoulder injury 09/27/2016  . Sciatica    left leg pinched nerve numb at times about once a year    PAST SURGICAL HISTORY: Past Surgical History:  Procedure Laterality Date  . BILIARY STENT PLACEMENT N/A 09/01/2016   Procedure: BILIARY STENT PLACEMENT;  Surgeon: [name], MD;  Location: [facility];  Service: Endoscopy;  Laterality: N/A;  . DIAGNOSTIC LAPAROSCOPIC LIVER BIOPSY  09/16/2016   Procedure: DIAGNOSTIC LAPAROSCOPIC LIVER BIOPSY;  Surgeon: [name], MD;  Location: [facility];  Service: General;;  . ERCP N/A 07/09/2016   Procedure: ENDOSCOPIC RETROGRADE CHOLANGIOPANCREATOGRAPHY (ERCP);  Surgeon: [name], MD;  Location: [facility];  Service: Endoscopy;  Laterality: N/A;  . ERCP N/A 09/01/2016   Procedure: ENDOSCOPIC RETROGRADE CHOLANGIOPANCREATOGRAPHY (ERCP);  Surgeon: [name], MD;  Location: [facility];  Service: Endoscopy;  Laterality: N/A;  . ESOPHAGOGASTRODUODENOSCOPY (EGD) WITH PROPOFOL N/A 09/01/2016   Procedure: ESOPHAGOGASTRODUODENOSCOPY (EGD) WITH PROPOFOL;  Surgeon: [name], MD;  [facility]: [facility];  Service: Gastroenterology;  Laterality: N/A;  . EUS N/A 09/01/2016   Procedure: ESOPHAGEAL ENDOSCOPIC ULTRASOUND (EUS) RADIAL;  Surgeon: [name], MD;  Location: [facility];  Service: Endoscopy;  Laterality: N/A;  . FINE NEEDLE ASPIRATION N/A 09/01/2016   Procedure: FINE NEEDLE ASPIRATION (FNA) RADIAL;  Surgeon: [name], MD;  Location: [facility];  Service: Endoscopy;  Laterality: N/A;  . FOREIGN BODY REMOVAL N/A 09/01/2016   Procedure: FOREIGN BODY REMOVAL;  Surgeon: [name], MD;  Location: [facility];  Service: Gastroenterology;  Laterality: N/A;  . KNEE SURGERY Right 2002   arthroscopy  . SHOULDER SURGERY Right 2002    FAMILY HISTORY: Family History  Problem Relation  Age of Onset  . Heart disease Father     ADVANCED DIRECTIVES (Y/N):  N  HEALTH MAINTENANCE: Social History  Substance Use  Topics  . Smoking status: Current Every Day Smoker    Packs/day: 0.50    Years: 40.00    Types: Cigarettes  . Smokeless tobacco: Never Used  . Alcohol use 4.2 oz/week    7 Cans of beer per week     Comment: last drink 5 weeeks ago  april 2018     Colonoscopy:  PAP:  Bone density:  Lipid panel:  Allergies  Allergen Reactions  . No Known Allergies     Current Outpatient Prescriptions  Medication Sig Dispense Refill  . albuterol (PROVENTIL HFA;VENTOLIN HFA) 108 (90 Base) MCG/ACT inhaler Inhale 1-2 puffs into the lungs every 6 (six) hours as needed for wheezing or shortness of breath. 1 Inhaler 6  . Ipratropium-Albuterol (COMBIVENT RESPIMAT) 20-100 MCG/ACT AERS respimat Inhale 1 puff into the lungs 4 (four) times daily.    Marland Kitchen lidocaine-prilocaine (EMLA) cream Apply to affected area once 30 g 3   No current facility-administered medications for this visit.    Facility-Administered Medications Ordered in Other Visits  Medication Dose Route Frequency Provider Last Rate Last Dose  . PACLitaxel-protein bound (ABRAXANE) chemo infusion 200 mg  200 mg Intravenous Once Lloyd Huger, MD        OBJECTIVE: Vitals:   12/01/16 0955  BP: 101/67  Pulse: 97  Resp: 18  Temp: (!) 96.6 F (35.9 C)     Body mass index is 16.46 kg/m.    ECOG FS:1 - Symptomatic but completely ambulatory  General: Thin, no acute distress. Eyes: Pink conjunctiva, anicteric sclera. Lungs: Clear to auscultation bilaterally. Heart: Regular rate and rhythm. No rubs, murmurs, or gallops. Abdomen: Soft, nontender, nondistended. No organomegaly noted, normoactive bowel sounds. Musculoskeletal: No edema, cyanosis, or clubbing. Neuro: Alert, answering all questions appropriately. Cranial nerves grossly intact. Skin: Maculopapular rash noted on torso. Psych: Normal affect.   LAB RESULTS:  Lab Results  Component Value Date   NA 132 (L) 12/01/2016   K 4.1 12/01/2016   CL 95 (L) 12/01/2016   CO2 30  12/01/2016   GLUCOSE 99 12/01/2016   BUN 6 12/01/2016   CREATININE 0.52 (L) 12/01/2016   CALCIUM 9.0 12/01/2016   PROT 6.4 (L) 12/01/2016   ALBUMIN 3.5 12/01/2016   AST 63 (H) 12/01/2016   ALT 58 12/01/2016   ALKPHOS 407 (H) 12/01/2016   BILITOT 0.5 12/01/2016   GFRNONAA >60 12/01/2016   GFRAA >60 12/01/2016    Lab Results  Component Value Date   WBC 8.4 12/01/2016   NEUTROABS 4.2 12/01/2016   HGB 14.1 12/01/2016   HCT 40.4 12/01/2016   MCV 90.0 12/01/2016   PLT 384 12/01/2016      STUDIES: No results found.  ASSESSMENT: Stage IV pancreatic cancer  PLAN:   1. Stage IV pancreatic cancer: Imaging, pathology, and Op note reviewed independently confirming stage IV disease with multiple peritoneal implants. PET scan results reviewed independently. Patient's CA-19-9 is now trending down to 29. Patient wishes to proceed with palliative chemotherapy. Given patient's rash, gemcitabine was discontinued. Abraxane okay to use now that his bilirubin and liver enzymes are within normal limits.  Proceed with cycle 3, day 8 of Abraxane today. Return to clinic in 1 week for consideration of cycle 3, day 15.Will reimage at the beginning of October and then patient will return to clinic on December 29, 2016 for discussion  of his imaging results and consideration of cycle 4, day 1 of necessary.   2. Pain: Resolved. Patient reports he is no longer taking oxycodone.  3. Elevated liver enzymes: Resolved. Secondary to malignancy, monitor. 4. Hyponatremia: Decreased, but stable. Monitor. 5. Rash: Resolved. Discontinue gemcitabine as above. 6. Peripheral edema: Continue elevation nightly.  Patient expressed understanding and was in agreement with this plan. He also understands that He can call clinic at any time with any questions, concerns, or complaints.   Cancer Staging Pancreatic cancer [facility]) Staging form: Exocrine Pancreas, AJCC 8th Edition - Clinical stage from 09/25/2016: Stage IV (cTX, cNX,  pM1) - Signed by Lloyd Huger, MD on 09/25/2016   Lloyd Huger, MD   12/01/2016 11:01 AM

## 2016-12-01 ENCOUNTER — Inpatient Hospital Stay: Payer: BLUE CROSS/BLUE SHIELD

## 2016-12-01 ENCOUNTER — Inpatient Hospital Stay: Payer: BLUE CROSS/BLUE SHIELD | Attending: Oncology | Admitting: Oncology

## 2016-12-01 VITALS — BP 101/67 | HR 97 | Temp 96.6°F | Resp 18 | Wt 121.4 lb

## 2016-12-01 DIAGNOSIS — I4891 Unspecified atrial fibrillation: Secondary | ICD-10-CM | POA: Diagnosis not present

## 2016-12-01 DIAGNOSIS — C787 Secondary malignant neoplasm of liver and intrahepatic bile duct: Secondary | ICD-10-CM | POA: Diagnosis not present

## 2016-12-01 DIAGNOSIS — Z79899 Other long term (current) drug therapy: Secondary | ICD-10-CM | POA: Insufficient documentation

## 2016-12-01 DIAGNOSIS — R5383 Other fatigue: Secondary | ICD-10-CM | POA: Insufficient documentation

## 2016-12-01 DIAGNOSIS — Z5111 Encounter for antineoplastic chemotherapy: Secondary | ICD-10-CM | POA: Insufficient documentation

## 2016-12-01 DIAGNOSIS — J449 Chronic obstructive pulmonary disease, unspecified: Secondary | ICD-10-CM | POA: Insufficient documentation

## 2016-12-01 DIAGNOSIS — C250 Malignant neoplasm of head of pancreas: Secondary | ICD-10-CM | POA: Diagnosis not present

## 2016-12-01 DIAGNOSIS — C259 Malignant neoplasm of pancreas, unspecified: Secondary | ICD-10-CM

## 2016-12-01 DIAGNOSIS — R531 Weakness: Secondary | ICD-10-CM | POA: Diagnosis not present

## 2016-12-01 DIAGNOSIS — R5381 Other malaise: Secondary | ICD-10-CM

## 2016-12-01 DIAGNOSIS — R600 Localized edema: Secondary | ICD-10-CM | POA: Insufficient documentation

## 2016-12-01 DIAGNOSIS — E871 Hypo-osmolality and hyponatremia: Secondary | ICD-10-CM | POA: Insufficient documentation

## 2016-12-01 DIAGNOSIS — F17210 Nicotine dependence, cigarettes, uncomplicated: Secondary | ICD-10-CM | POA: Insufficient documentation

## 2016-12-01 LAB — CBC WITH DIFFERENTIAL/PLATELET
BASOS PCT: 3 %
Basophils Absolute: 0.2 10*3/uL — ABNORMAL HIGH (ref 0–0.1)
Eosinophils Absolute: 0.8 10*3/uL — ABNORMAL HIGH (ref 0–0.7)
Eosinophils Relative: 10 %
HEMATOCRIT: 40.4 % (ref 40.0–52.0)
HEMOGLOBIN: 14.1 g/dL (ref 13.0–18.0)
Lymphocytes Relative: 28 %
Lymphs Abs: 2.3 10*3/uL (ref 1.0–3.6)
MCH: 31.5 pg (ref 26.0–34.0)
MCHC: 35 g/dL (ref 32.0–36.0)
MCV: 90 fL (ref 80.0–100.0)
MONOS PCT: 9 %
Monocytes Absolute: 0.8 10*3/uL (ref 0.2–1.0)
NEUTROS PCT: 50 %
Neutro Abs: 4.2 10*3/uL (ref 1.4–6.5)
Platelets: 384 10*3/uL (ref 150–440)
RBC: 4.49 MIL/uL (ref 4.40–5.90)
RDW: 16.3 % — ABNORMAL HIGH (ref 11.5–14.5)
WBC: 8.4 10*3/uL (ref 3.8–10.6)

## 2016-12-01 LAB — COMPREHENSIVE METABOLIC PANEL
ALBUMIN: 3.5 g/dL (ref 3.5–5.0)
ALK PHOS: 407 U/L — AB (ref 38–126)
ALT: 58 U/L (ref 17–63)
ANION GAP: 7 (ref 5–15)
AST: 63 U/L — ABNORMAL HIGH (ref 15–41)
BILIRUBIN TOTAL: 0.5 mg/dL (ref 0.3–1.2)
BUN: 6 mg/dL (ref 6–20)
CALCIUM: 9 mg/dL (ref 8.9–10.3)
CO2: 30 mmol/L (ref 22–32)
CREATININE: 0.52 mg/dL — AB (ref 0.61–1.24)
Chloride: 95 mmol/L — ABNORMAL LOW (ref 101–111)
GFR calc Af Amer: >60 mL/min (ref >60)
GFR calc non Af Amer: >60 mL/min (ref >60)
GLUCOSE: 99 mg/dL (ref 65–99)
Potassium: 4.1 mmol/L (ref 3.5–5.1)
Sodium: 132 mmol/L — ABNORMAL LOW (ref 135–145)
TOTAL PROTEIN: 6.4 g/dL — AB (ref 6.5–8.1)

## 2016-12-01 MED ORDER — PROCHLORPERAZINE MALEATE 10 MG PO TABS
10.0000 mg | ORAL_TABLET | Freq: Once | ORAL | Status: AC
  Administered 2016-12-01: 10 mg via ORAL

## 2016-12-01 MED ORDER — SODIUM CHLORIDE 0.9 % IV SOLN
Freq: Once | INTRAVENOUS | Status: AC
  Administered 2016-12-01: 11:00:00 via INTRAVENOUS
  Filled 2016-12-01: qty 1000

## 2016-12-01 MED ORDER — PACLITAXEL PROTEIN-BOUND CHEMO INJECTION 100 MG
200.0000 mg | Freq: Once | INTRAVENOUS | Status: AC
  Administered 2016-12-01: 200 mg via INTRAVENOUS
  Filled 2016-12-01: qty 40

## 2016-12-02 LAB — CANCER ANTIGEN 19-9: CA 19-9: 29 U/mL (ref 0–35)

## 2016-12-06 ENCOUNTER — Inpatient Hospital Stay: Payer: BLUE CROSS/BLUE SHIELD

## 2016-12-06 NOTE — Progress Notes (Signed)
Nutrition  Patient was a no show for nutrition appointment today.    Azuri Bozard B. Shaquaya Wuellner, RD, LDN Registered Dietitian [phone] (pager)   

## 2016-12-08 ENCOUNTER — Inpatient Hospital Stay: Payer: BLUE CROSS/BLUE SHIELD

## 2016-12-08 VITALS — BP 109/75 | HR 86 | Temp 96.9°F | Resp 18 | Wt 127.2 lb

## 2016-12-08 DIAGNOSIS — C250 Malignant neoplasm of head of pancreas: Secondary | ICD-10-CM | POA: Diagnosis not present

## 2016-12-08 DIAGNOSIS — C259 Malignant neoplasm of pancreas, unspecified: Secondary | ICD-10-CM

## 2016-12-08 LAB — CBC WITH DIFFERENTIAL/PLATELET
Basophils Absolute: 0.1 10*3/uL (ref 0–0.1)
Basophils Relative: 2 %
EOS ABS: 1.2 10*3/uL — AB (ref 0–0.7)
EOS PCT: 19 %
HCT: 39.1 % — ABNORMAL LOW (ref 40.0–52.0)
Hemoglobin: 13.7 g/dL (ref 13.0–18.0)
LYMPHS ABS: 2 10*3/uL (ref 1.0–3.6)
Lymphocytes Relative: 33 %
MCH: 31.8 pg (ref 26.0–34.0)
MCHC: 35.1 g/dL (ref 32.0–36.0)
MCV: 90.6 fL (ref 80.0–100.0)
MONO ABS: 0.5 10*3/uL (ref 0.2–1.0)
MONOS PCT: 8 %
Neutro Abs: 2.3 10*3/uL (ref 1.4–6.5)
Neutrophils Relative %: 38 %
PLATELETS: 402 10*3/uL (ref 150–440)
RBC: 4.32 MIL/uL — AB (ref 4.40–5.90)
RDW: 16.5 % — AB (ref 11.5–14.5)
WBC: 6.1 10*3/uL (ref 3.8–10.6)

## 2016-12-08 LAB — COMPREHENSIVE METABOLIC PANEL
ALT: 37 U/L (ref 17–63)
ANION GAP: 6 (ref 5–15)
AST: 44 U/L — ABNORMAL HIGH (ref 15–41)
Albumin: 3.4 g/dL — ABNORMAL LOW (ref 3.5–5.0)
Alkaline Phosphatase: 359 U/L — ABNORMAL HIGH (ref 38–126)
BUN: 6 mg/dL (ref 6–20)
CHLORIDE: 95 mmol/L — AB (ref 101–111)
CO2: 30 mmol/L (ref 22–32)
Calcium: 9 mg/dL (ref 8.9–10.3)
Creatinine, Ser: 0.5 mg/dL — ABNORMAL LOW (ref 0.61–1.24)
Glucose, Bld: 148 mg/dL — ABNORMAL HIGH (ref 65–99)
POTASSIUM: 4.1 mmol/L (ref 3.5–5.1)
SODIUM: 131 mmol/L — AB (ref 135–145)
Total Bilirubin: 0.6 mg/dL (ref 0.3–1.2)
Total Protein: 6.1 g/dL — ABNORMAL LOW (ref 6.5–8.1)

## 2016-12-08 MED ORDER — SODIUM CHLORIDE 0.9 % IV SOLN
Freq: Once | INTRAVENOUS | Status: AC
  Administered 2016-12-08: 10:00:00 via INTRAVENOUS
  Filled 2016-12-08: qty 1000

## 2016-12-08 MED ORDER — PACLITAXEL PROTEIN-BOUND CHEMO INJECTION 100 MG
200.0000 mg | Freq: Once | INTRAVENOUS | Status: AC
  Administered 2016-12-08: 200 mg via INTRAVENOUS
  Filled 2016-12-08: qty 40

## 2016-12-08 MED ORDER — PROCHLORPERAZINE MALEATE 10 MG PO TABS
10.0000 mg | ORAL_TABLET | Freq: Once | ORAL | Status: AC
  Administered 2016-12-08: 10 mg via ORAL
  Filled 2016-12-08: qty 1

## 2016-12-09 LAB — CANCER ANTIGEN 19-9: CAN 19-9: 23 U/mL (ref 0–35)

## 2016-12-24 ENCOUNTER — Other Ambulatory Visit: Payer: Self-pay | Admitting: *Deleted

## 2016-12-24 DIAGNOSIS — C259 Malignant neoplasm of pancreas, unspecified: Secondary | ICD-10-CM

## 2016-12-27 ENCOUNTER — Telehealth: Payer: Self-pay | Admitting: *Deleted

## 2016-12-27 ENCOUNTER — Ambulatory Visit: Admission: RE | Admit: 2016-12-27 | Payer: BLUE CROSS/BLUE SHIELD | Source: Ambulatory Visit

## 2016-12-27 NOTE — Progress Notes (Signed)
[place]  [phone] [phone]  ID: [name]. OB: [birth year]  MR#: [id number]  YTK#:[id number]  Patient Care Team: Patient, [name] as PCP - General (General Practice) [name], MD as Consulting Physician (Oncology) [name], MD as Consulting Physician (Gastroenterology)  CHIEF COMPLAINT: Stage IV pancreatic cancer  INTERVAL HISTORY: Patient returns to clinic today for further evaluation and consideration of cycle 4, day 1 single agent Abraxane. Patient's phone was not getting service and he missed the appointment for his restaging CT scan. He continues to have increased weakness and fatigue, but is otherwise tolerating his treatments. He has some mild peripheral edema, which resolves with elevation. He does not have any further abdominal pain and is no longer taking narcotics. He has no neurologic complaints. He denies any fevers. His appetite has improved and he continues to gain weight. He has no chest pain or shortness of breath. He denies any nausea, vomiting, constipation, or diarrhea. He has no urinary complaints. Patient offers no further specific complaints today.  REVIEW OF SYSTEMS:   Review of Systems  Constitutional: Positive for malaise/fatigue. Negative for fever and weight loss.  Respiratory: Negative.  Negative for cough and shortness of breath.   Cardiovascular: Positive for leg swelling. Negative for chest pain.  Gastrointestinal: Negative for abdominal pain, nausea and vomiting.  Genitourinary: Negative.   Musculoskeletal: Negative.   Skin: Negative for itching and rash.  Neurological: Positive for weakness. Negative for sensory change.  Psychiatric/Behavioral: Negative.  The patient is not nervous/anxious.     As per HPI. Otherwise, a complete review of systems is negative.  PAST MEDICAL HISTORY: Past Medical History:  Diagnosis Date  . Arthritis   . Asthma   . Atrial fibrillation ([place])   .  Closed left ankle fracture age 56  . COPD (chronic obstructive pulmonary disease) ([place])   . Dysrhythmia   . Pneumonia 2011  . Right shoulder injury 09/27/2016  . Sciatica    left leg pinched nerve numb at times about once a year    PAST SURGICAL HISTORY: Past Surgical History:  Procedure Laterality Date  . BILIARY STENT PLACEMENT N/A 09/01/2016   Procedure: BILIARY STENT PLACEMENT;  Surgeon: [name], MD;  [facility];  Service: Endoscopy;  Laterality: N/A;  . DIAGNOSTIC LAPAROSCOPIC LIVER BIOPSY  09/16/2016   Procedure: DIAGNOSTIC LAPAROSCOPIC LIVER BIOPSY;  Surgeon: [name], MD;  Location: [facility];  Service: General;;  . ERCP N/A 07/09/2016   Procedure: ENDOSCOPIC RETROGRADE CHOLANGIOPANCREATOGRAPHY (ERCP);  Surgeon: [name], MD;  Location: [facility];  Service: Endoscopy;  Laterality: N/A;  . ERCP N/A 09/01/2016   Procedure: ENDOSCOPIC RETROGRADE CHOLANGIOPANCREATOGRAPHY (ERCP);  Surgeon: [name], MD;  Location: [facility];  Service: Endoscopy;  Laterality: N/A;  . ESOPHAGOGASTRODUODENOSCOPY (EGD) WITH PROPOFOL N/A 09/01/2016   Procedure: ESOPHAGOGASTRODUODENOSCOPY (EGD) WITH PROPOFOL;  Surgeon: [name], MD;  Location[facility];  Service: Gastroenterology;  Laterality: N/A;  . EUS N/A 09/01/2016   Procedure: ESOPHAGEAL ENDOSCOPIC ULTRASOUND (EUS) RADIAL;  Surgeon: [name], MD;  Location[facility];  Service: Endoscopy;  Laterality: N/A;  . FINE NEEDLE ASPIRATION N/A 09/01/2016   Procedure: FINE NEEDLE ASPIRATION (FNA) RADIAL;  Surgeon: [name], MD;  Location: [facility];  Service: Endoscopy;  Laterality: N/A;  . FOREIGN BODY REMOVAL N/A 09/01/2016   Procedure: FOREIGN BODY REMOVAL;  Surgeon: [name], MD;  Location[facility];  Service: Gastroenterology;  Laterality: N/A;  . KNEE SURGERY Right 2002   arthroscopy  .  SHOULDER SURGERY Right 2002    FAMILY HISTORY: Family History  Problem Relation Age of Onset  . Heart  disease Father     ADVANCED DIRECTIVES (Y/N):  N  HEALTH MAINTENANCE: Social History  Substance Use Topics  . Smoking status: Current Every Day Smoker    Packs/day: 0.50    Years: 40.00    Types: Cigarettes  . Smokeless tobacco: Never Used  . Alcohol use 4.2 oz/week    7 Cans of beer per week     Comment: last drink 5 weeeks ago  april 2018     Colonoscopy:  PAP:  Bone density:  Lipid panel:  Allergies  Allergen Reactions  . No Known Allergies     Current Outpatient Prescriptions  Medication Sig Dispense Refill  . albuterol (PROVENTIL HFA;VENTOLIN HFA) 108 (90 Base) MCG/ACT inhaler Inhale 1-2 puffs into the lungs every 6 (six) hours as needed for wheezing or shortness of breath. 1 Inhaler 6  . Ipratropium-Albuterol (COMBIVENT RESPIMAT) 20-100 MCG/ACT AERS respimat Inhale 1 puff into the lungs 4 (four) times daily.    Marland Kitchen lidocaine-prilocaine (EMLA) cream Apply to affected area once 30 g 3   No current facility-administered medications for this visit.     OBJECTIVE: Vitals:   12/29/16 0958  BP: 129/87  Pulse: 92  Resp: 18  Temp: 97.8 F (36.6 C)     Body mass index is 15.98 kg/m.    ECOG FS:1 - Symptomatic but completely ambulatory  General: Thin, no acute distress. Eyes: Pink conjunctiva, anicteric sclera. Lungs: Clear to auscultation bilaterally. Heart: Regular rate and rhythm. No rubs, murmurs, or gallops. Abdomen: Soft, nontender, nondistended. No organomegaly noted, normoactive bowel sounds. Musculoskeletal: No edema, cyanosis, or clubbing. Neuro: Alert, answering all questions appropriately. Cranial nerves grossly intact. Skin: Maculopapular rash noted on torso. Psych: Normal affect.   LAB RESULTS:  Lab Results  Component Value Date   NA 129 (L) 12/29/2016   K 4.4 12/29/2016   CL 91 (L) 12/29/2016   CO2 29 12/29/2016   GLUCOSE 107 (H) 12/29/2016   BUN 6 12/29/2016   CREATININE 0.52 (L) 12/29/2016   CALCIUM 9.3 12/29/2016   PROT 7.1 12/29/2016     ALBUMIN 3.7 12/29/2016   AST 37 12/29/2016   ALT 35 12/29/2016   ALKPHOS 431 (H) 12/29/2016   BILITOT 0.9 12/29/2016   GFRNONAA >60 12/29/2016   GFRAA >60 12/29/2016    Lab Results  Component Value Date   WBC 10.0 12/29/2016   NEUTROABS 5.8 12/29/2016   HGB 16.0 12/29/2016   HCT 46.1 12/29/2016   MCV 91.0 12/29/2016   PLT 370 12/29/2016      STUDIES: No results found.  ASSESSMENT: Stage IV pancreatic cancer  PLAN:   1. Stage IV pancreatic cancer: Imaging, pathology, and Op note reviewed independently confirming stage IV disease with multiple peritoneal implants. PET scan results reviewed independently. Patient's CA-19-9 is now trending down to 23. Patient wishes to proceed with palliative chemotherapy. Given patient's rash, gemcitabine was discontinued. Abraxane okay to use now that his bilirubin and liver enzymes are within normal limits.  Proceed with cycle 4, day 1 of Abraxane today. Return to clinic in 1 week for consideration of cycle 4, day 8. Will Reschedule his imaging with CT scan in the next week.  2. Pain: Resolved. Patient reports he is no longer taking oxycodone.  3. Elevated liver enzymes: Resolved. Secondary to malignancy, monitor. 4. Hyponatremia: Decreased, but stable. Monitor. 5. Rash: Resolved. Discontinue gemcitabine as above.  6. Peripheral edema: Continue elevation nightly.  Patient expressed understanding and was in agreement with this plan. He also understands that He can call clinic at any time with any questions, concerns, or complaints.   Cancer Staging Pancreatic cancer [facility]) Staging form: Exocrine Pancreas, AJCC 8th Edition - Clinical stage from 09/25/2016: Stage IV (cTX, cNX, pM1) - Signed by Lloyd Huger, MD on 09/25/2016   Lloyd Huger, MD   12/29/2016 4:18 PM

## 2016-12-28 ENCOUNTER — Other Ambulatory Visit: Payer: Self-pay | Admitting: Oncology

## 2016-12-28 ENCOUNTER — Ambulatory Visit: Payer: BLUE CROSS/BLUE SHIELD

## 2016-12-29 ENCOUNTER — Inpatient Hospital Stay: Payer: BLUE CROSS/BLUE SHIELD

## 2016-12-29 ENCOUNTER — Inpatient Hospital Stay: Payer: BLUE CROSS/BLUE SHIELD | Attending: Oncology | Admitting: Oncology

## 2016-12-29 VITALS — BP 129/87 | HR 92 | Temp 97.8°F | Resp 18 | Wt 117.8 lb

## 2016-12-29 DIAGNOSIS — R5383 Other fatigue: Secondary | ICD-10-CM | POA: Diagnosis not present

## 2016-12-29 DIAGNOSIS — C259 Malignant neoplasm of pancreas, unspecified: Secondary | ICD-10-CM

## 2016-12-29 DIAGNOSIS — E871 Hypo-osmolality and hyponatremia: Secondary | ICD-10-CM | POA: Insufficient documentation

## 2016-12-29 DIAGNOSIS — J449 Chronic obstructive pulmonary disease, unspecified: Secondary | ICD-10-CM | POA: Diagnosis not present

## 2016-12-29 DIAGNOSIS — I4891 Unspecified atrial fibrillation: Secondary | ICD-10-CM | POA: Insufficient documentation

## 2016-12-29 DIAGNOSIS — R531 Weakness: Secondary | ICD-10-CM | POA: Diagnosis not present

## 2016-12-29 DIAGNOSIS — R600 Localized edema: Secondary | ICD-10-CM | POA: Diagnosis not present

## 2016-12-29 DIAGNOSIS — R5381 Other malaise: Secondary | ICD-10-CM | POA: Diagnosis not present

## 2016-12-29 DIAGNOSIS — C250 Malignant neoplasm of head of pancreas: Secondary | ICD-10-CM | POA: Diagnosis present

## 2016-12-29 DIAGNOSIS — R21 Rash and other nonspecific skin eruption: Secondary | ICD-10-CM | POA: Diagnosis not present

## 2016-12-29 DIAGNOSIS — C787 Secondary malignant neoplasm of liver and intrahepatic bile duct: Secondary | ICD-10-CM

## 2016-12-29 DIAGNOSIS — F17210 Nicotine dependence, cigarettes, uncomplicated: Secondary | ICD-10-CM | POA: Diagnosis not present

## 2016-12-29 DIAGNOSIS — Z79899 Other long term (current) drug therapy: Secondary | ICD-10-CM | POA: Diagnosis not present

## 2016-12-29 DIAGNOSIS — Z5111 Encounter for antineoplastic chemotherapy: Secondary | ICD-10-CM | POA: Diagnosis not present

## 2016-12-29 DIAGNOSIS — I700 Atherosclerosis of aorta: Secondary | ICD-10-CM | POA: Diagnosis not present

## 2016-12-29 LAB — CBC WITH DIFFERENTIAL/PLATELET
BASOS PCT: 1 %
Basophils Absolute: 0.1 10*3/uL (ref 0–0.1)
Eosinophils Absolute: 0.5 10*3/uL (ref 0–0.7)
Eosinophils Relative: 5 %
HEMATOCRIT: 46.1 % (ref 40.0–52.0)
HEMOGLOBIN: 16 g/dL (ref 13.0–18.0)
LYMPHS PCT: 21 %
Lymphs Abs: 2.1 10*3/uL (ref 1.0–3.6)
MCH: 31.6 pg (ref 26.0–34.0)
MCHC: 34.7 g/dL (ref 32.0–36.0)
MCV: 91 fL (ref 80.0–100.0)
MONOS PCT: 15 %
Monocytes Absolute: 1.5 10*3/uL — ABNORMAL HIGH (ref 0.2–1.0)
NEUTROS ABS: 5.8 10*3/uL (ref 1.4–6.5)
NEUTROS PCT: 58 %
Platelets: 370 10*3/uL (ref 150–440)
RBC: 5.07 MIL/uL (ref 4.40–5.90)
RDW: 17.2 % — ABNORMAL HIGH (ref 11.5–14.5)
WBC: 10 10*3/uL (ref 3.8–10.6)

## 2016-12-29 LAB — COMPREHENSIVE METABOLIC PANEL
ALBUMIN: 3.7 g/dL (ref 3.5–5.0)
ALK PHOS: 431 U/L — AB (ref 38–126)
ALT: 35 U/L (ref 17–63)
AST: 37 U/L (ref 15–41)
Anion gap: 9 (ref 5–15)
BILIRUBIN TOTAL: 0.9 mg/dL (ref 0.3–1.2)
BUN: 6 mg/dL (ref 6–20)
CALCIUM: 9.3 mg/dL (ref 8.9–10.3)
CO2: 29 mmol/L (ref 22–32)
Chloride: 91 mmol/L — ABNORMAL LOW (ref 101–111)
Creatinine, Ser: 0.52 mg/dL — ABNORMAL LOW (ref 0.61–1.24)
GFR calc Af Amer: >60 mL/min (ref >60)
GFR calc non Af Amer: >60 mL/min (ref >60)
GLUCOSE: 107 mg/dL — AB (ref 65–99)
POTASSIUM: 4.4 mmol/L (ref 3.5–5.1)
Sodium: 129 mmol/L — ABNORMAL LOW (ref 135–145)
TOTAL PROTEIN: 7.1 g/dL (ref 6.5–8.1)

## 2016-12-29 MED ORDER — ALBUTEROL SULFATE HFA 108 (90 BASE) MCG/ACT IN AERS: 1.0000 | INHALATION_SPRAY | Freq: Four times a day (QID) | RESPIRATORY_TRACT | 6 refills | Status: DC | PRN

## 2016-12-29 MED ORDER — PACLITAXEL PROTEIN-BOUND CHEMO INJECTION 100 MG
200.0000 mg | Freq: Once | INTRAVENOUS | Status: AC
  Administered 2016-12-29: 200 mg via INTRAVENOUS
  Filled 2016-12-29: qty 40

## 2016-12-29 MED ORDER — SODIUM CHLORIDE 0.9 % IV SOLN
Freq: Once | INTRAVENOUS | Status: AC
  Administered 2016-12-29: 11:00:00 via INTRAVENOUS
  Filled 2016-12-29: qty 1000

## 2016-12-29 MED ORDER — PROCHLORPERAZINE MALEATE 10 MG PO TABS
10.0000 mg | ORAL_TABLET | Freq: Once | ORAL | Status: AC
  Administered 2016-12-29: 10 mg via ORAL
  Filled 2016-12-29: qty 1

## 2016-12-29 NOTE — Progress Notes (Signed)
Patient reports swelling to feet and legs, reports pain 5/10 this morning.

## 2016-12-30 LAB — CANCER ANTIGEN 19-9: CAN 19-9: 34 U/mL (ref 0–35)

## 2016-12-31 ENCOUNTER — Ambulatory Visit
Admission: RE | Admit: 2016-12-31 | Discharge: 2016-12-31 | Disposition: A | Discharge status: Home / Self Care | Payer: BLUE CROSS/BLUE SHIELD | Source: Ambulatory Visit | Attending: Oncology | Admitting: Oncology

## 2016-12-31 DIAGNOSIS — C259 Malignant neoplasm of pancreas, unspecified: Secondary | ICD-10-CM

## 2017-01-02 NOTE — Progress Notes (Signed)
[place]  [phone] [phone]  ID: Benjamin Diaz. OB: [birth year]  MR#: [id number]  DXI#:[id number]  Patient Care Team: Patient, [name] as PCP - General (General Practice) [name], MD as Consulting Physician (Oncology) [name], MD as Consulting Physician (Gastroenterology)  CHIEF COMPLAINT: Stage IV pancreatic cancer  INTERVAL HISTORY: Patient returns to clinic today for further evaluation and consideration of cycle 4, day 8 single agent Abraxane. Patient had a restaging CT scan this past week. He continues to have increased weakness and fatigue, but is otherwise tolerating his treatments. He has some mild peripheral edema, which resolves with elevation. He does not have any further abdominal pain and is no longer taking narcotics. He has no neurologic complaints. He denies any fevers. His appetite has improved and he continues to gain weight. He has no chest pain or shortness of breath. He denies any nausea, vomiting, constipation, or diarrhea. He has no urinary complaints. Patient offers no further specific complaints today.  REVIEW OF SYSTEMS:   Review of Systems  Constitutional: Positive for malaise/fatigue. Negative for fever and weight loss.  Respiratory: Negative.  Negative for cough and shortness of breath.   Cardiovascular: Positive for leg swelling. Negative for chest pain.  Gastrointestinal: Negative for abdominal pain, nausea and vomiting.  Genitourinary: Negative.   Musculoskeletal: Negative.   Skin: Negative for itching and rash.  Neurological: Positive for weakness. Negative for sensory change.  Psychiatric/Behavioral: Negative.  The patient is not nervous/anxious.     As per HPI. Otherwise, a complete review of systems is negative.  PAST MEDICAL HISTORY: Past Medical History:  Diagnosis Date  . Arthritis   . Asthma   . Atrial fibrillation ([place])   . Cancer ([place])   . Closed left ankle fracture age 66    . COPD (chronic obstructive pulmonary disease) ([place])   . Dysrhythmia   . Pneumonia 2011  . Right shoulder injury 09/27/2016  . Sciatica    left leg pinched nerve numb at times about once a year    PAST SURGICAL HISTORY: Past Surgical History:  Procedure Laterality Date  . BILIARY STENT PLACEMENT N/A 09/01/2016   Procedure: BILIARY STENT PLACEMENT;  Surgeon: [name], MD;  Location: [facility];  Service: Endoscopy;  Laterality: N/A;  . DIAGNOSTIC LAPAROSCOPIC LIVER BIOPSY  09/16/2016   Procedure: DIAGNOSTIC LAPAROSCOPIC LIVER BIOPSY;  Surgeon: [name], MD;  Location: [facility];  Service: General;;  . ERCP N/A 07/09/2016   Procedure: ENDOSCOPIC RETROGRADE CHOLANGIOPANCREATOGRAPHY (ERCP);  Surgeon: [name], MD;  Location: [facility];  Service: Endoscopy;  Laterality: N/A;  . ERCP N/A 09/01/2016   Procedure: ENDOSCOPIC RETROGRADE CHOLANGIOPANCREATOGRAPHY (ERCP);  Surgeon: [name], MD;  Location: [facility];  Service: Endoscopy;  Laterality: N/A;  . ESOPHAGOGASTRODUODENOSCOPY (EGD) WITH PROPOFOL N/A 09/01/2016   Procedure: ESOPHAGOGASTRODUODENOSCOPY (EGD) WITH PROPOFOL;  Surgeon: [name], MD;  Location: [facility];  Service: Gastroenterology;  Laterality: N/A;  . EUS N/A 09/01/2016   Procedure: ESOPHAGEAL ENDOSCOPIC ULTRASOUND (EUS) RADIAL;  Surgeon: [name], MD;  Location: [facility];  Service: Endoscopy;  Laterality: N/A;  . FINE NEEDLE ASPIRATION N/A 09/01/2016   Procedure: FINE NEEDLE ASPIRATION (FNA) RADIAL;  Surgeon: [name], MD;  Location: [facility];  Service: Endoscopy;  Laterality: N/A;  . FOREIGN BODY REMOVAL N/A 09/01/2016   Procedure: FOREIGN BODY REMOVAL;  Surgeon: [name], MD;  Location: [facility];  Service: Gastroenterology;  Laterality: N/A;  . KNEE SURGERY Right 2002   arthroscopy  .  SHOULDER SURGERY Right 2002    FAMILY HISTORY: Family History  Problem Relation Age of Onset  . Heart disease Father     ADVANCED  DIRECTIVES (Y/N):  N  HEALTH MAINTENANCE: Social History  Substance Use Topics  . Smoking status: Current Every Day Smoker    Packs/day: 0.50    Years: 40.00    Types: Cigarettes  . Smokeless tobacco: Never Used  . Alcohol use 4.2 oz/week    7 Cans of beer per week     Comment: last drink 5 weeeks ago  april 2018     Colonoscopy:  PAP:  Bone density:  Lipid panel:  Allergies  Allergen Reactions  . No Known Allergies     Current Outpatient Prescriptions  Medication Sig Dispense Refill  . albuterol (PROVENTIL HFA;VENTOLIN HFA) 108 (90 Base) MCG/ACT inhaler Inhale 1-2 puffs into the lungs every 6 (six) hours as needed for wheezing or shortness of breath. 1 Inhaler 6  . Ipratropium-Albuterol (COMBIVENT RESPIMAT) 20-100 MCG/ACT AERS respimat Inhale 1 puff into the lungs 4 (four) times daily.    Marland Kitchen lidocaine-prilocaine (EMLA) cream Apply to affected area once (Patient not taking: Reported on 01/05/2017) 30 g 3   No current facility-administered medications for this visit.     OBJECTIVE: Vitals:   01/05/17 1018  BP: 123/79  Pulse: 77  Resp: 18  Temp: (!) 96.8 F (36 C)     Body mass index is 16.06 kg/m.    ECOG FS:1 - Symptomatic but completely ambulatory  General: Thin, no acute distress. Eyes: Pink conjunctiva, anicteric sclera. Lungs: Clear to auscultation bilaterally. Heart: Regular rate and rhythm. No rubs, murmurs, or gallops. Abdomen: Soft, nontender, nondistended. No organomegaly noted, normoactive bowel sounds. Musculoskeletal: No edema, cyanosis, or clubbing. Neuro: Alert, answering all questions appropriately. Cranial nerves grossly intact. Skin: Maculopapular rash noted on torso. Psych: Normal affect.   LAB RESULTS:  Lab Results  Component Value Date   NA 131 (L) 01/05/2017   K 4.3 01/05/2017   CL 93 (L) 01/05/2017   CO2 30 01/05/2017   GLUCOSE 87 01/05/2017   BUN <5 (L) 01/05/2017   CREATININE 0.52 (L) 01/05/2017   CALCIUM 9.1 01/05/2017   PROT  6.6 01/05/2017   ALBUMIN 3.7 01/05/2017   AST 37 01/05/2017   ALT 31 01/05/2017   ALKPHOS 358 (H) 01/05/2017   BILITOT 0.6 01/05/2017   GFRNONAA >60 01/05/2017   GFRAA >60 01/05/2017    Lab Results  Component Value Date   WBC 7.7 01/05/2017   NEUTROABS 4.3 01/05/2017   HGB 14.8 01/05/2017   HCT 43.7 01/05/2017   MCV 93.0 01/05/2017   PLT 349 01/05/2017      STUDIES: Ct Chest W Contrast  Result Date: 01/03/2017 CLINICAL DATA:  Restaging pancreas cancer. EXAM: CT CHEST, ABDOMEN, AND PELVIS WITH CONTRAST TECHNIQUE: Multidetector CT imaging of the chest, abdomen and pelvis was performed following the standard protocol during bolus administration of intravenous contrast. CONTRAST:  140mL ISOVUE-300 IOPAMIDOL (ISOVUE-300) INJECTION 61% COMPARISON:  09/27/2016 FINDINGS: CT CHEST FINDINGS Cardiovascular: The heart size appears normal. No pericardial effusion. Aortic atherosclerosis. Mediastinum/Nodes: No enlarged mediastinal, hilar, or axillary lymph nodes. Thyroid gland, trachea, and esophagus demonstrate no significant findings. Lungs/Pleura: No pleural effusion. Advanced changes of emphysema. No suspicious pulmonary nodules or masses. Musculoskeletal: No chest wall mass or suspicious bone lesions identified. CT ABDOMEN PELVIS FINDINGS Hepatobiliary: No focal liver abnormality identified. A stent is identified within the common bile duct. There is mild intrahepatic biliary dilatation  with pneumobilia. Mild intrahepatic biliary dilatation and pneumobilia. The previously described hypermetabolic nodule adjacent to the gallbladder fundus measures 7 mm, image 83 of series 2. Previously 1.5 cm. Pancreas: The pancreatic head mass is difficult to visualized/measure. The remaining portions of the pancreas appear normal. No pancreatic ductal dilatation. Spleen: Spleen is unremarkable Adrenals/Urinary Tract: The adrenal glands appear normal. The kidneys are unremarkable. The urinary bladder is normal.  Stomach/Bowel: The stomach is normal. The small bowel loops have a normal course and caliber. Unremarkable appearance of the colon. No bowel obstruction. Vascular/Lymphatic: Aortic atherosclerosis. No aneurysm. No upper abdominal adenopathy. Reproductive: Prostate is unremarkable. Other: There is no ascites identified within the abdomen or pelvis. Musculoskeletal: No aggressive lytic or sclerotic bone lesions. IMPRESSION: 1. Pancreatic head mass is difficult to visualize and remeasure on today's exam. No definite evidence for local tumor progression compared with MRI from 07/08/2016. 2. No focal liver abnormalities identified on the current exam to correspond to the hypermetabolic foci identified on PET-CT from 09/27/2016 3. The hypermetabolic nodule associated with the gallbladder fundus is smaller on today's study. 4. No evidence for thoracic metastasis. 5.  Aortic Atherosclerosis (ICD10-I70.0). Emphysema (ICD10-J43.9). Electronically Signed   By: Kerby Moors M.D.   On: 01/03/2017 09:40   Ct Abdomen Pelvis W Contrast  Result Date: 01/03/2017 CLINICAL DATA:  Restaging pancreas cancer. EXAM: CT CHEST, ABDOMEN, AND PELVIS WITH CONTRAST TECHNIQUE: Multidetector CT imaging of the chest, abdomen and pelvis was performed following the standard protocol during bolus administration of intravenous contrast. CONTRAST:  138mL ISOVUE-300 IOPAMIDOL (ISOVUE-300) INJECTION 61% COMPARISON:  09/27/2016 FINDINGS: CT CHEST FINDINGS Cardiovascular: The heart size appears normal. No pericardial effusion. Aortic atherosclerosis. Mediastinum/Nodes: No enlarged mediastinal, hilar, or axillary lymph nodes. Thyroid gland, trachea, and esophagus demonstrate no significant findings. Lungs/Pleura: No pleural effusion. Advanced changes of emphysema. No suspicious pulmonary nodules or masses. Musculoskeletal: No chest wall mass or suspicious bone lesions identified. CT ABDOMEN PELVIS FINDINGS Hepatobiliary: No focal liver abnormality  identified. A stent is identified within the common bile duct. There is mild intrahepatic biliary dilatation with pneumobilia. Mild intrahepatic biliary dilatation and pneumobilia. The previously described hypermetabolic nodule adjacent to the gallbladder fundus measures 7 mm, image 83 of series 2. Previously 1.5 cm. Pancreas: The pancreatic head mass is difficult to visualized/measure. The remaining portions of the pancreas appear normal. No pancreatic ductal dilatation. Spleen: Spleen is unremarkable Adrenals/Urinary Tract: The adrenal glands appear normal. The kidneys are unremarkable. The urinary bladder is normal. Stomach/Bowel: The stomach is normal. The small bowel loops have a normal course and caliber. Unremarkable appearance of the colon. No bowel obstruction. Vascular/Lymphatic: Aortic atherosclerosis. No aneurysm. No upper abdominal adenopathy. Reproductive: Prostate is unremarkable. Other: There is no ascites identified within the abdomen or pelvis. Musculoskeletal: No aggressive lytic or sclerotic bone lesions. IMPRESSION: 1. Pancreatic head mass is difficult to visualize and remeasure on today's exam. No definite evidence for local tumor progression compared with MRI from 07/08/2016. 2. No focal liver abnormalities identified on the current exam to correspond to the hypermetabolic foci identified on PET-CT from 09/27/2016 3. The hypermetabolic nodule associated with the gallbladder fundus is smaller on today's study. 4. No evidence for thoracic metastasis. 5.  Aortic Atherosclerosis (ICD10-I70.0). Emphysema (ICD10-J43.9). Electronically Signed   By: Kerby Moors M.D.   On: 01/03/2017 09:40    ASSESSMENT: Stage IV pancreatic cancer  PLAN:   1. Stage IV pancreatic cancer: Imaging, pathology, and Op note reviewed independently confirming stage IV disease with multiple peritoneal implants.  CT scan results from January 03, 2017 reviewed independently and reported as above with significant  improvement of patient's disease burden and no evidence of progressive disease. Patient's CA-19-9 is now within normal limits. Patient wishes to proceed with palliative chemotherapy. Given patient's rash, gemcitabine was discontinued. Abraxane okay to use now that his bilirubin and liver enzymes are within normal limits.  Proceed with cycle 4, day 8 of Abraxane today. Return to clinic in 1 week for consideration of cycle 4, day 15. We will complete 6 cycles of chemotherapy and then consider a break if his CT scan remains with minimal evidence of disease. 2. Pain: Resolved. Patient reports he is no longer taking oxycodone.  3. Elevated liver enzymes: Resolved. Secondary to malignancy, monitor. 4. Hyponatremia: Decreased, but stable. Monitor. 5. Rash: Resolved. Discontinue gemcitabine as above. 6. Peripheral edema: Continue elevation nightly.  Patient expressed understanding and was in agreement with this plan. He also understands that He can call clinic at any time with any questions, concerns, or complaints.   Cancer Staging Pancreatic cancer [facility]) Staging form: Exocrine Pancreas, AJCC 8th Edition - Clinical stage from 09/25/2016: Stage IV (cTX, cNX, pM1) - Signed by Lloyd Huger, MD on 09/25/2016   Lloyd Huger, MD   01/08/2017 7:11 AM

## 2017-01-03 ENCOUNTER — Ambulatory Visit
Admission: RE | Admit: 2017-01-03 | Discharge: 2017-01-03 | Disposition: A | Discharge status: Home / Self Care | Payer: BLUE CROSS/BLUE SHIELD | Source: Ambulatory Visit | Attending: Oncology | Admitting: Oncology

## 2017-01-03 DIAGNOSIS — C259 Malignant neoplasm of pancreas, unspecified: Secondary | ICD-10-CM | POA: Insufficient documentation

## 2017-01-03 DIAGNOSIS — J439 Emphysema, unspecified: Secondary | ICD-10-CM | POA: Insufficient documentation

## 2017-01-03 DIAGNOSIS — I700 Atherosclerosis of aorta: Secondary | ICD-10-CM | POA: Insufficient documentation

## 2017-01-03 MED ORDER — IOPAMIDOL (ISOVUE-300) INJECTION 61%
100.0000 mL | Freq: Once | INTRAVENOUS | Status: AC | PRN
  Administered 2017-01-03: 100 mL via INTRAVENOUS

## 2017-01-05 ENCOUNTER — Inpatient Hospital Stay (HOSPITAL_BASED_OUTPATIENT_CLINIC_OR_DEPARTMENT_OTHER): Payer: BLUE CROSS/BLUE SHIELD | Admitting: Oncology

## 2017-01-05 ENCOUNTER — Inpatient Hospital Stay: Payer: BLUE CROSS/BLUE SHIELD

## 2017-01-05 VITALS — BP 123/79 | HR 77 | Temp 96.8°F | Resp 18 | Wt 118.4 lb

## 2017-01-05 DIAGNOSIS — R5383 Other fatigue: Secondary | ICD-10-CM | POA: Diagnosis not present

## 2017-01-05 DIAGNOSIS — F17210 Nicotine dependence, cigarettes, uncomplicated: Secondary | ICD-10-CM | POA: Diagnosis not present

## 2017-01-05 DIAGNOSIS — C259 Malignant neoplasm of pancreas, unspecified: Secondary | ICD-10-CM

## 2017-01-05 DIAGNOSIS — R5381 Other malaise: Secondary | ICD-10-CM | POA: Diagnosis not present

## 2017-01-05 DIAGNOSIS — R531 Weakness: Secondary | ICD-10-CM

## 2017-01-05 DIAGNOSIS — C787 Secondary malignant neoplasm of liver and intrahepatic bile duct: Secondary | ICD-10-CM | POA: Diagnosis not present

## 2017-01-05 DIAGNOSIS — E871 Hypo-osmolality and hyponatremia: Secondary | ICD-10-CM | POA: Diagnosis not present

## 2017-01-05 DIAGNOSIS — Z79899 Other long term (current) drug therapy: Secondary | ICD-10-CM

## 2017-01-05 DIAGNOSIS — I4891 Unspecified atrial fibrillation: Secondary | ICD-10-CM

## 2017-01-05 DIAGNOSIS — J449 Chronic obstructive pulmonary disease, unspecified: Secondary | ICD-10-CM | POA: Diagnosis not present

## 2017-01-05 DIAGNOSIS — I700 Atherosclerosis of aorta: Secondary | ICD-10-CM

## 2017-01-05 DIAGNOSIS — C250 Malignant neoplasm of head of pancreas: Secondary | ICD-10-CM | POA: Diagnosis not present

## 2017-01-05 DIAGNOSIS — R600 Localized edema: Secondary | ICD-10-CM | POA: Diagnosis not present

## 2017-01-05 LAB — COMPREHENSIVE METABOLIC PANEL
ALBUMIN: 3.7 g/dL (ref 3.5–5.0)
ALT: 31 U/L (ref 17–63)
AST: 37 U/L (ref 15–41)
Alkaline Phosphatase: 358 U/L — ABNORMAL HIGH (ref 38–126)
Anion gap: 8 (ref 5–15)
BUN: <5 mg/dL — ABNORMAL LOW (ref 6–20)
CHLORIDE: 93 mmol/L — AB (ref 101–111)
CO2: 30 mmol/L (ref 22–32)
Calcium: 9.1 mg/dL (ref 8.9–10.3)
Creatinine, Ser: 0.52 mg/dL — ABNORMAL LOW (ref 0.61–1.24)
GFR calc Af Amer: >60 mL/min (ref >60)
GLUCOSE: 87 mg/dL (ref 65–99)
POTASSIUM: 4.3 mmol/L (ref 3.5–5.1)
SODIUM: 131 mmol/L — AB (ref 135–145)
TOTAL PROTEIN: 6.6 g/dL (ref 6.5–8.1)
Total Bilirubin: 0.6 mg/dL (ref 0.3–1.2)

## 2017-01-05 LAB — CBC WITH DIFFERENTIAL/PLATELET
BASOS ABS: 0 10*3/uL (ref 0–0.1)
BASOS PCT: 0 %
EOS ABS: 1.2 10*3/uL — AB (ref 0–0.7)
EOS PCT: 16 %
HCT: 43.7 % (ref 40.0–52.0)
Hemoglobin: 14.8 g/dL (ref 13.0–18.0)
LYMPHS PCT: 24 %
Lymphs Abs: 1.8 10*3/uL (ref 1.0–3.6)
MCH: 31.5 pg (ref 26.0–34.0)
MCHC: 33.8 g/dL (ref 32.0–36.0)
MCV: 93 fL (ref 80.0–100.0)
MONO ABS: 0.3 10*3/uL (ref 0.2–1.0)
Monocytes Relative: 4 %
Neutro Abs: 4.3 10*3/uL (ref 1.4–6.5)
Neutrophils Relative %: 56 %
PLATELETS: 349 10*3/uL (ref 150–440)
RBC: 4.7 MIL/uL (ref 4.40–5.90)
RDW: 17.1 % — AB (ref 11.5–14.5)
WBC: 7.7 10*3/uL (ref 3.8–10.6)

## 2017-01-05 MED ORDER — PACLITAXEL PROTEIN-BOUND CHEMO INJECTION 100 MG
200.0000 mg | Freq: Once | INTRAVENOUS | Status: AC
  Administered 2017-01-05: 200 mg via INTRAVENOUS
  Filled 2017-01-05: qty 40

## 2017-01-05 MED ORDER — PROCHLORPERAZINE MALEATE 10 MG PO TABS
10.0000 mg | ORAL_TABLET | Freq: Once | ORAL | Status: AC
  Administered 2017-01-05: 10 mg via ORAL
  Filled 2017-01-05: qty 1

## 2017-01-05 MED ORDER — SODIUM CHLORIDE 0.9 % IV SOLN
Freq: Once | INTRAVENOUS | Status: AC
  Administered 2017-01-05: 11:00:00 via INTRAVENOUS
  Filled 2017-01-05: qty 1000

## 2017-01-05 NOTE — Progress Notes (Signed)
Here for follow up. per pt stated " I feel all right "

## 2017-01-06 LAB — CANCER ANTIGEN 19-9: CAN 19-9: 37 U/mL — AB (ref 0–35)

## 2017-01-10 NOTE — Progress Notes (Signed)
[name]  [phone] [phone]  ID: Benjamin Diaz. OB: March 10, 1961  MR#: [id number]  KGM#:[id number]  Patient Care Team: Patient, [name] as PCP - General (General Practice) [name], MD as Consulting Physician (Oncology) [name], MD as Consulting Physician (Gastroenterology)  CHIEF COMPLAINT: Stage IV pancreatic cancer  INTERVAL HISTORY: Patient returns to clinic today for further evaluation and consideration of cycle 4, day 15 single agent Abraxane. He has intermittent abdominal pain and persistent weakness and fatigue, but otherwise feels well.  He has some mild peripheral edema, which resolves with elevation. He has no neurologic complaints. He denies any fevers. His appetite has improved and is weight is stable. He has no chest pain or shortness of breath. He denies any nausea, vomiting, constipation, or diarrhea. He has no urinary complaints. Patient offers no further specific complaints today.  REVIEW OF SYSTEMS:   Review of Systems  Constitutional: Positive for malaise/fatigue. Negative for fever and weight loss.  Respiratory: Negative.  Negative for cough and shortness of breath.   Cardiovascular: Positive for leg swelling. Negative for chest pain.  Gastrointestinal: Negative for abdominal pain, nausea and vomiting.  Genitourinary: Negative.   Musculoskeletal: Negative.   Skin: Negative for itching and rash.  Neurological: Positive for weakness. Negative for sensory change.  Psychiatric/Behavioral: Negative.  The patient is not nervous/anxious.     As per HPI. Otherwise, a complete review of systems is negative.  PAST MEDICAL HISTORY: Past Medical History:  Diagnosis Date  . Arthritis   . Asthma   . Atrial fibrillation ([place])   . Cancer ([place])   . Closed left ankle fracture age 37  . COPD (chronic obstructive pulmonary disease) ([place])   . Dysrhythmia   . Pneumonia 2011  . Right shoulder injury 09/27/2016  .  Sciatica    left leg pinched nerve numb at times about once a year    PAST SURGICAL HISTORY: Past Surgical History:  Procedure Laterality Date  . BILIARY STENT PLACEMENT N/A 09/01/2016   Procedure: BILIARY STENT PLACEMENT;  Surgeon: [name], MD;  Location: [facility];  Service: Endoscopy;  Laterality: N/A;  . DIAGNOSTIC LAPAROSCOPIC LIVER BIOPSY  09/16/2016   Procedure: DIAGNOSTIC LAPAROSCOPIC LIVER BIOPSY;  Surgeon: [name], MD;  Location: [facility];  Service: General;;  . ERCP N/A 07/09/2016   Procedure: ENDOSCOPIC RETROGRADE CHOLANGIOPANCREATOGRAPHY (ERCP);  Surgeon: [name], MD;  Location: [facility];  Service: Endoscopy;  Laterality: N/A;  . ERCP N/A 09/01/2016   Procedure: ENDOSCOPIC RETROGRADE CHOLANGIOPANCREATOGRAPHY (ERCP);  Surgeon: [name], MD;  Location: [facility];  Service: Endoscopy;  Laterality: N/A;  . ESOPHAGOGASTRODUODENOSCOPY (EGD) WITH PROPOFOL N/A 09/01/2016   Procedure: ESOPHAGOGASTRODUODENOSCOPY (EGD) WITH PROPOFOL;  Surgeon: [name], MD;  Location: [facility];  Service: Gastroenterology;  Laterality: N/A;  . EUS N/A 09/01/2016   Procedure: ESOPHAGEAL ENDOSCOPIC ULTRASOUND (EUS) RADIAL;  Surgeon: [name], MD;  Location: [facility];  Service: Endoscopy;  Laterality: N/A;  . FINE NEEDLE ASPIRATION N/A 09/01/2016   Procedure: FINE NEEDLE ASPIRATION (FNA) RADIAL;  Surgeon: [name], MD;  Location: [facility];  Service: Endoscopy;  Laterality: N/A;  . FOREIGN BODY REMOVAL N/A 09/01/2016   Procedure: FOREIGN BODY REMOVAL;  Surgeon: [name], MD;  Location: [facility];  Service: Gastroenterology;  Laterality: N/A;  . KNEE SURGERY Right 2002   arthroscopy  . SHOULDER SURGERY Right 2002    FAMILY HISTORY: Family History  Problem Relation Age of Onset  . Heart disease Father  ADVANCED DIRECTIVES (Y/N):  N  HEALTH MAINTENANCE: Social History  Substance Use Topics  . Smoking status: Current Every Day Smoker     Packs/day: 0.50    Years: 40.00    Types: Cigarettes  . Smokeless tobacco: Never Used  . Alcohol use 4.2 oz/week    7 Cans of beer per week     Comment: last drink 5 weeeks ago  april 2018     Colonoscopy:  PAP:  Bone density:  Lipid panel:  Allergies  Allergen Reactions  . No Known Allergies     Current Outpatient Prescriptions  Medication Sig Dispense Refill  . albuterol (PROVENTIL HFA;VENTOLIN HFA) 108 (90 Base) MCG/ACT inhaler Inhale 1-2 puffs into the lungs every 6 (six) hours as needed for wheezing or shortness of breath. 1 Inhaler 6  . Ipratropium-Albuterol (COMBIVENT RESPIMAT) 20-100 MCG/ACT AERS respimat Inhale 1 puff into the lungs 4 (four) times daily.    Marland Kitchen lidocaine-prilocaine (EMLA) cream Apply to affected area once 30 g 3   No current facility-administered medications for this visit.     OBJECTIVE: Vitals:   01/12/17 1030  BP: 127/82  Pulse: 81  Temp: (!) 96.9 F (36.1 C)     Body mass index is 16.38 kg/m.    ECOG FS:1 - Symptomatic but completely ambulatory  General: Thin, no acute distress. Eyes: Pink conjunctiva, anicteric sclera. Lungs: Clear to auscultation bilaterally. Heart: Regular rate and rhythm. No rubs, murmurs, or gallops. Abdomen: Soft, nontender, nondistended. No organomegaly noted, normoactive bowel sounds. Musculoskeletal: No edema, cyanosis, or clubbing. Neuro: Alert, answering all questions appropriately. Cranial nerves grossly intact. Skin: Maculopapular rash noted on torso. Psych: Normal affect.   LAB RESULTS:  Lab Results  Component Value Date   NA 132 (L) 01/12/2017   K 3.9 01/12/2017   CL 95 (L) 01/12/2017   CO2 29 01/12/2017   GLUCOSE 103 (H) 01/12/2017   BUN 6 01/12/2017   CREATININE 0.50 (L) 01/12/2017   CALCIUM 8.9 01/12/2017   PROT 6.3 (L) 01/12/2017   ALBUMIN 3.6 01/12/2017   AST 30 01/12/2017   ALT 24 01/12/2017   ALKPHOS 278 (H) 01/12/2017   BILITOT 0.5 01/12/2017   GFRNONAA >60 01/12/2017   GFRAA >60  01/12/2017    Lab Results  Component Value Date   WBC 5.4 01/12/2017   NEUTROABS 2.9 01/12/2017   HGB 13.7 01/12/2017   HCT 41.0 01/12/2017   MCV 93.6 01/12/2017   PLT 426 01/12/2017      STUDIES: Ct Chest W Contrast  Result Date: 01/03/2017 CLINICAL DATA:  Restaging pancreas cancer. EXAM: CT CHEST, ABDOMEN, AND PELVIS WITH CONTRAST TECHNIQUE: Multidetector CT imaging of the chest, abdomen and pelvis was performed following the standard protocol during bolus administration of intravenous contrast. CONTRAST:  171mL ISOVUE-300 IOPAMIDOL (ISOVUE-300) INJECTION 61% COMPARISON:  09/27/2016 FINDINGS: CT CHEST FINDINGS Cardiovascular: The heart size appears normal. No pericardial effusion. Aortic atherosclerosis. Mediastinum/Nodes: No enlarged mediastinal, hilar, or axillary lymph nodes. Thyroid gland, trachea, and esophagus demonstrate no significant findings. Lungs/Pleura: No pleural effusion. Advanced changes of emphysema. No suspicious pulmonary nodules or masses. Musculoskeletal: No chest wall mass or suspicious bone lesions identified. CT ABDOMEN PELVIS FINDINGS Hepatobiliary: No focal liver abnormality identified. A stent is identified within the common bile duct. There is mild intrahepatic biliary dilatation with pneumobilia. Mild intrahepatic biliary dilatation and pneumobilia. The previously described hypermetabolic nodule adjacent to the gallbladder fundus measures 7 mm, image 83 of series 2. Previously 1.5 cm. Pancreas: The pancreatic head mass  is difficult to visualized/measure. The remaining portions of the pancreas appear normal. No pancreatic ductal dilatation. Spleen: Spleen is unremarkable Adrenals/Urinary Tract: The adrenal glands appear normal. The kidneys are unremarkable. The urinary bladder is normal. Stomach/Bowel: The stomach is normal. The small bowel loops have a normal course and caliber. Unremarkable appearance of the colon. No bowel obstruction. Vascular/Lymphatic: Aortic  atherosclerosis. No aneurysm. No upper abdominal adenopathy. Reproductive: Prostate is unremarkable. Other: There is no ascites identified within the abdomen or pelvis. Musculoskeletal: No aggressive lytic or sclerotic bone lesions. IMPRESSION: 1. Pancreatic head mass is difficult to visualize and remeasure on today's exam. No definite evidence for local tumor progression compared with MRI from 07/08/2016. 2. No focal liver abnormalities identified on the current exam to correspond to the hypermetabolic foci identified on PET-CT from 09/27/2016 3. The hypermetabolic nodule associated with the gallbladder fundus is smaller on today's study. 4. No evidence for thoracic metastasis. 5.  Aortic Atherosclerosis (ICD10-I70.0). Emphysema (ICD10-J43.9). Electronically Signed   By: Kerby Moors M.D.   On: 01/03/2017 09:40   Ct Abdomen Pelvis W Contrast  Result Date: 01/03/2017 CLINICAL DATA:  Restaging pancreas cancer. EXAM: CT CHEST, ABDOMEN, AND PELVIS WITH CONTRAST TECHNIQUE: Multidetector CT imaging of the chest, abdomen and pelvis was performed following the standard protocol during bolus administration of intravenous contrast. CONTRAST:  120mL ISOVUE-300 IOPAMIDOL (ISOVUE-300) INJECTION 61% COMPARISON:  09/27/2016 FINDINGS: CT CHEST FINDINGS Cardiovascular: The heart size appears normal. No pericardial effusion. Aortic atherosclerosis. Mediastinum/Nodes: No enlarged mediastinal, hilar, or axillary lymph nodes. Thyroid gland, trachea, and esophagus demonstrate no significant findings. Lungs/Pleura: No pleural effusion. Advanced changes of emphysema. No suspicious pulmonary nodules or masses. Musculoskeletal: No chest wall mass or suspicious bone lesions identified. CT ABDOMEN PELVIS FINDINGS Hepatobiliary: No focal liver abnormality identified. A stent is identified within the common bile duct. There is mild intrahepatic biliary dilatation with pneumobilia. Mild intrahepatic biliary dilatation and pneumobilia. The  previously described hypermetabolic nodule adjacent to the gallbladder fundus measures 7 mm, image 83 of series 2. Previously 1.5 cm. Pancreas: The pancreatic head mass is difficult to visualized/measure. The remaining portions of the pancreas appear normal. No pancreatic ductal dilatation. Spleen: Spleen is unremarkable Adrenals/Urinary Tract: The adrenal glands appear normal. The kidneys are unremarkable. The urinary bladder is normal. Stomach/Bowel: The stomach is normal. The small bowel loops have a normal course and caliber. Unremarkable appearance of the colon. No bowel obstruction. Vascular/Lymphatic: Aortic atherosclerosis. No aneurysm. No upper abdominal adenopathy. Reproductive: Prostate is unremarkable. Other: There is no ascites identified within the abdomen or pelvis. Musculoskeletal: No aggressive lytic or sclerotic bone lesions. IMPRESSION: 1. Pancreatic head mass is difficult to visualize and remeasure on today's exam. No definite evidence for local tumor progression compared with MRI from 07/08/2016. 2. No focal liver abnormalities identified on the current exam to correspond to the hypermetabolic foci identified on PET-CT from 09/27/2016 3. The hypermetabolic nodule associated with the gallbladder fundus is smaller on today's study. 4. No evidence for thoracic metastasis. 5.  Aortic Atherosclerosis (ICD10-I70.0). Emphysema (ICD10-J43.9). Electronically Signed   By: Kerby Moors M.D.   On: 01/03/2017 09:40    ASSESSMENT: Stage IV pancreatic cancer  PLAN:   1. Stage IV pancreatic cancer: Imaging, pathology, and Op note reviewed independently confirming stage IV disease with multiple peritoneal implants. CT scan results from January 03, 2017 reviewed independently with significant improvement of patient's disease burden and no evidence of progressive disease. Patient's CA-19-9 is now within normal limits. Patient wishes to proceed with  palliative chemotherapy. Given patient's rash, gemcitabine  was discontinued. Abraxane okay to use now that his bilirubin and liver enzymes are within normal limits.  Proceed with cycle 4, day 15 of Abraxane today. Return to clinic in 2 weeks for consideration of cycle 5, day 1. We will complete 6 cycles of chemotherapy and then consider a break if his CT scan remains with minimal evidence of disease. 2. Pain: Intermittent and mild. Patient reports he is no longer taking oxycodone.  3. Elevated liver enzymes: Resolved. Secondary to malignancy, monitor. 4. Hyponatremia: Decreased, but stable. Monitor. 5. Rash: Resolved. Discontinue gemcitabine as above. 6. Peripheral edema: Continue elevation nightly.  Patient expressed understanding and was in agreement with this plan. He also understands that He can call clinic at any time with any questions, concerns, or complaints.   Cancer Staging Pancreatic cancer [facility]) Staging form: Exocrine Pancreas, AJCC 8th Edition - Clinical stage from 09/25/2016: Stage IV (cTX, cNX, pM1) - Signed by Lloyd Huger, MD on 09/25/2016   Lloyd Huger, MD   01/12/2017 3:03 PM

## 2017-01-12 ENCOUNTER — Inpatient Hospital Stay: Payer: BLUE CROSS/BLUE SHIELD

## 2017-01-12 ENCOUNTER — Encounter: Payer: Self-pay | Admitting: Oncology

## 2017-01-12 ENCOUNTER — Inpatient Hospital Stay (HOSPITAL_BASED_OUTPATIENT_CLINIC_OR_DEPARTMENT_OTHER): Payer: BLUE CROSS/BLUE SHIELD | Admitting: Oncology

## 2017-01-12 VITALS — BP 127/82 | HR 81 | Temp 96.9°F | Wt 120.8 lb

## 2017-01-12 DIAGNOSIS — E871 Hypo-osmolality and hyponatremia: Secondary | ICD-10-CM

## 2017-01-12 DIAGNOSIS — C259 Malignant neoplasm of pancreas, unspecified: Secondary | ICD-10-CM

## 2017-01-12 DIAGNOSIS — C787 Secondary malignant neoplasm of liver and intrahepatic bile duct: Secondary | ICD-10-CM

## 2017-01-12 DIAGNOSIS — Z79899 Other long term (current) drug therapy: Secondary | ICD-10-CM

## 2017-01-12 DIAGNOSIS — R600 Localized edema: Secondary | ICD-10-CM

## 2017-01-12 DIAGNOSIS — C250 Malignant neoplasm of head of pancreas: Secondary | ICD-10-CM

## 2017-01-12 DIAGNOSIS — F17210 Nicotine dependence, cigarettes, uncomplicated: Secondary | ICD-10-CM

## 2017-01-12 LAB — CBC WITH DIFFERENTIAL/PLATELET
Basophils Absolute: 0 10*3/uL (ref 0–0.1)
Basophils Relative: 0 %
EOS ABS: 0.6 10*3/uL (ref 0–0.7)
Eosinophils Relative: 11 %
HEMATOCRIT: 41 % (ref 40.0–52.0)
HEMOGLOBIN: 13.7 g/dL (ref 13.0–18.0)
LYMPHS ABS: 1.6 10*3/uL (ref 1.0–3.6)
LYMPHS PCT: 29 %
MCH: 31.3 pg (ref 26.0–34.0)
MCHC: 33.4 g/dL (ref 32.0–36.0)
MCV: 93.6 fL (ref 80.0–100.0)
MONOS PCT: 7 %
Monocytes Absolute: 0.4 10*3/uL (ref 0.2–1.0)
NEUTROS PCT: 53 %
Neutro Abs: 2.9 10*3/uL (ref 1.4–6.5)
Platelets: 426 10*3/uL (ref 150–440)
RBC: 4.38 MIL/uL — ABNORMAL LOW (ref 4.40–5.90)
RDW: 17.2 % — ABNORMAL HIGH (ref 11.5–14.5)
WBC: 5.4 10*3/uL (ref 3.8–10.6)

## 2017-01-12 LAB — COMPREHENSIVE METABOLIC PANEL
ALK PHOS: 278 U/L — AB (ref 38–126)
ALT: 24 U/L (ref 17–63)
ANION GAP: 8 (ref 5–15)
AST: 30 U/L (ref 15–41)
Albumin: 3.6 g/dL (ref 3.5–5.0)
BILIRUBIN TOTAL: 0.5 mg/dL (ref 0.3–1.2)
BUN: 6 mg/dL (ref 6–20)
CHLORIDE: 95 mmol/L — AB (ref 101–111)
CO2: 29 mmol/L (ref 22–32)
Calcium: 8.9 mg/dL (ref 8.9–10.3)
Creatinine, Ser: 0.5 mg/dL — ABNORMAL LOW (ref 0.61–1.24)
Glucose, Bld: 103 mg/dL — ABNORMAL HIGH (ref 65–99)
POTASSIUM: 3.9 mmol/L (ref 3.5–5.1)
SODIUM: 132 mmol/L — AB (ref 135–145)
TOTAL PROTEIN: 6.3 g/dL — AB (ref 6.5–8.1)

## 2017-01-12 MED ORDER — PROCHLORPERAZINE MALEATE 10 MG PO TABS
10.0000 mg | ORAL_TABLET | Freq: Once | ORAL | Status: AC
  Administered 2017-01-12: 10 mg via ORAL
  Filled 2017-01-12: qty 1

## 2017-01-12 MED ORDER — PACLITAXEL PROTEIN-BOUND CHEMO INJECTION 100 MG
200.0000 mg | Freq: Once | INTRAVENOUS | Status: AC
Start: 2017-01-12 — End: 2017-01-12
  Administered 2017-01-12: 200 mg via INTRAVENOUS
  Filled 2017-01-12: qty 40

## 2017-01-12 MED ORDER — SODIUM CHLORIDE 0.9 % IV SOLN
Freq: Once | INTRAVENOUS | Status: AC
  Administered 2017-01-12: 11:00:00 via INTRAVENOUS
  Filled 2017-01-12: qty 1000

## 2017-01-13 LAB — CANCER ANTIGEN 19-9: CAN 19-9: 42 U/mL — AB (ref 0–35)

## 2017-01-24 NOTE — Progress Notes (Signed)
[place]  Telephone:([phone] [phone]  ID: Benjamin Diaz. OB: [birth year]  MR#: [id number]  AOZ#:[id number]  Patient Care Team: Patient, [name] as PCP - General (General Practice) [name], MD as Consulting Physician (Oncology) [name], MD as Consulting Physician (Gastroenterology)  CHIEF COMPLAINT: Stage IV pancreatic cancer  INTERVAL HISTORY: Patient returns to clinic today for further evaluation and consideration of cycle 5, day 1 single agent Abraxane. He does not complain of abdominal pain today.  He continues to have persistent weakness and fatigue, but otherwise feels well.  He has some mild peripheral edema, which resolves with elevation. He has no neurologic complaints. He denies any fevers. His appetite has improved and is weight is stable. He has no chest pain or shortness of breath. He denies any nausea, vomiting, constipation, or diarrhea. He has no urinary complaints. Patient offers no further specific complaints today.  REVIEW OF SYSTEMS:   Review of Systems  Constitutional: Positive for malaise/fatigue. Negative for fever and weight loss.  Respiratory: Negative.  Negative for cough and shortness of breath.   Cardiovascular: Positive for leg swelling. Negative for chest pain.  Gastrointestinal: Negative for abdominal pain, nausea and vomiting.  Genitourinary: Negative.   Musculoskeletal: Negative.   Skin: Negative for itching and rash.  Neurological: Positive for weakness. Negative for sensory change.  Psychiatric/Behavioral: Negative.  The patient is not nervous/anxious.     As per HPI. Otherwise, a complete review of systems is negative.  PAST MEDICAL HISTORY: Past Medical History:  Diagnosis Date  . Arthritis   . Asthma   . Atrial fibrillation ([place])   . Cancer ([place])   . Closed left ankle fracture age 16  . COPD (chronic obstructive pulmonary disease) ([place])   . Dysrhythmia   . Pneumonia 2011  . Right  shoulder injury 09/27/2016  . Sciatica    left leg pinched nerve numb at times about once a year    PAST SURGICAL HISTORY: Past Surgical History:  Procedure Laterality Date  . BILIARY STENT PLACEMENT N/A 09/01/2016   Procedure: BILIARY STENT PLACEMENT;  Surgeon: [name], MD;  Location: [facility];  Service: Endoscopy;  Laterality: N/A;  . DIAGNOSTIC LAPAROSCOPIC LIVER BIOPSY  09/16/2016   Procedure: DIAGNOSTIC LAPAROSCOPIC LIVER BIOPSY;  Surgeon: [name], MD;  Location: [place];  Service: General;;  . ERCP N/A 07/09/2016   Procedure: ENDOSCOPIC RETROGRADE CHOLANGIOPANCREATOGRAPHY (ERCP);  Surgeon: [name], MD;  Location: [facility];  Service: Endoscopy;  Laterality: N/A;  . ERCP N/A 09/01/2016   Procedure: ENDOSCOPIC RETROGRADE CHOLANGIOPANCREATOGRAPHY (ERCP);  Surgeon: [name], MD;  Location: [facility];  Service: Endoscopy;  Laterality: N/A;  . ESOPHAGOGASTRODUODENOSCOPY (EGD) WITH PROPOFOL N/A 09/01/2016   Procedure: ESOPHAGOGASTRODUODENOSCOPY (EGD) WITH PROPOFOL;  Surgeon: [name], MD;  Location: [facility];  Service: Gastroenterology;  Laterality: N/A;  . EUS N/A 09/01/2016   Procedure: ESOPHAGEAL ENDOSCOPIC ULTRASOUND (EUS) RADIAL;  Surgeon: [name], MD;  Location: [facility];  Service: Endoscopy;  Laterality: N/A;  . FINE NEEDLE ASPIRATION N/A 09/01/2016   Procedure: FINE NEEDLE ASPIRATION (FNA) RADIAL;  Surgeon: [name], MD;  Location: [facility];  Service: Endoscopy;  Laterality: N/A;  . FOREIGN BODY REMOVAL N/A 09/01/2016   Procedure: FOREIGN BODY REMOVAL;  Surgeon: [name], MD;  Location: [facility];  Service: Gastroenterology;  Laterality: N/A;  . KNEE SURGERY Right 2002   arthroscopy  . SHOULDER SURGERY Right 2002    FAMILY HISTORY: Family History  Problem Relation Age of  Onset  . Heart disease Father     ADVANCED DIRECTIVES (Y/N):  N  HEALTH MAINTENANCE: Social History  Substance Use Topics  . Smoking status: Current  Every Day Smoker    Packs/day: 0.50    Years: 40.00    Types: Cigarettes  . Smokeless tobacco: Never Used  . Alcohol use 4.2 oz/week    7 Cans of beer per week     Comment: last drink 5 weeeks ago  april 2018     Colonoscopy:  PAP:  Bone density:  Lipid panel:  Allergies  Allergen Reactions  . No Known Allergies     Current Outpatient Prescriptions  Medication Sig Dispense Refill  . albuterol (PROVENTIL HFA;VENTOLIN HFA) 108 (90 Base) MCG/ACT inhaler Inhale 1-2 puffs into the lungs every 6 (six) hours as needed for wheezing or shortness of breath. 1 Inhaler 6  . Ipratropium-Albuterol (COMBIVENT RESPIMAT) 20-100 MCG/ACT AERS respimat Inhale 1 puff into the lungs 4 (four) times daily.    Marland Kitchen lidocaine-prilocaine (EMLA) cream Apply to affected area once 30 g 3   No current facility-administered medications for this visit.     OBJECTIVE: Vitals:   01/26/17 1039  BP: 120/82  Pulse: 97  Resp: 14  Temp: (!) 97 F (36.1 C)  SpO2: 95%     Body mass index is 16.14 kg/m.    ECOG FS:1 - Symptomatic but completely ambulatory  General: Thin, no acute distress. Eyes: Pink conjunctiva, anicteric sclera. Lungs: Clear to auscultation bilaterally. Heart: Regular rate and rhythm. No rubs, murmurs, or gallops. Abdomen: Soft, nontender, nondistended. No organomegaly noted, normoactive bowel sounds. Musculoskeletal: No edema, cyanosis, or clubbing. Neuro: Alert, answering all questions appropriately. Cranial nerves grossly intact. Skin: Maculopapular rash noted on torso. Psych: Normal affect.   LAB RESULTS:  Lab Results  Component Value Date   NA 133 (L) 01/26/2017   K 4.4 01/26/2017   CL 96 (L) 01/26/2017   CO2 29 01/26/2017   GLUCOSE 85 01/26/2017   BUN 7 01/26/2017   CREATININE 0.56 (L) 01/26/2017   CALCIUM 9.0 01/26/2017   PROT 6.7 01/26/2017   ALBUMIN 3.8 01/26/2017   AST 49 (H) 01/26/2017   ALT 40 01/26/2017   ALKPHOS 313 (H) 01/26/2017   BILITOT 0.4 01/26/2017    GFRNONAA >60 01/26/2017   GFRAA >60 01/26/2017    Lab Results  Component Value Date   WBC 7.0 01/26/2017   NEUTROABS 3.9 01/26/2017   HGB 15.2 01/26/2017   HCT 44.9 01/26/2017   MCV 94.6 01/26/2017   PLT 382 01/26/2017      STUDIES: Ct Chest W Contrast  Result Date: 01/03/2017 CLINICAL DATA:  Restaging pancreas cancer. EXAM: CT CHEST, ABDOMEN, AND PELVIS WITH CONTRAST TECHNIQUE: Multidetector CT imaging of the chest, abdomen and pelvis was performed following the standard protocol during bolus administration of intravenous contrast. CONTRAST:  138mL ISOVUE-300 IOPAMIDOL (ISOVUE-300) INJECTION 61% COMPARISON:  09/27/2016 FINDINGS: CT CHEST FINDINGS Cardiovascular: The heart size appears normal. No pericardial effusion. Aortic atherosclerosis. Mediastinum/Nodes: No enlarged mediastinal, hilar, or axillary lymph nodes. Thyroid gland, trachea, and esophagus demonstrate no significant findings. Lungs/Pleura: No pleural effusion. Advanced changes of emphysema. No suspicious pulmonary nodules or masses. Musculoskeletal: No chest wall mass or suspicious bone lesions identified. CT ABDOMEN PELVIS FINDINGS Hepatobiliary: No focal liver abnormality identified. A stent is identified within the common bile duct. There is mild intrahepatic biliary dilatation with pneumobilia. Mild intrahepatic biliary dilatation and pneumobilia. The previously described hypermetabolic nodule adjacent to the gallbladder fundus measures  7 mm, image 83 of series 2. Previously 1.5 cm. Pancreas: The pancreatic head mass is difficult to visualized/measure. The remaining portions of the pancreas appear normal. No pancreatic ductal dilatation. Spleen: Spleen is unremarkable Adrenals/Urinary Tract: The adrenal glands appear normal. The kidneys are unremarkable. The urinary bladder is normal. Stomach/Bowel: The stomach is normal. The small bowel loops have a normal course and caliber. Unremarkable appearance of the colon. No bowel  obstruction. Vascular/Lymphatic: Aortic atherosclerosis. No aneurysm. No upper abdominal adenopathy. Reproductive: Prostate is unremarkable. Other: There is no ascites identified within the abdomen or pelvis. Musculoskeletal: No aggressive lytic or sclerotic bone lesions. IMPRESSION: 1. Pancreatic head mass is difficult to visualize and remeasure on today's exam. No definite evidence for local tumor progression compared with MRI from 07/08/2016. 2. No focal liver abnormalities identified on the current exam to correspond to the hypermetabolic foci identified on PET-CT from 09/27/2016 3. The hypermetabolic nodule associated with the gallbladder fundus is smaller on today's study. 4. No evidence for thoracic metastasis. 5.  Aortic Atherosclerosis (ICD10-I70.0). Emphysema (ICD10-J43.9). Electronically Signed   By: Kerby Moors M.D.   On: 01/03/2017 09:40   Ct Abdomen Pelvis W Contrast  Result Date: 01/03/2017 CLINICAL DATA:  Restaging pancreas cancer. EXAM: CT CHEST, ABDOMEN, AND PELVIS WITH CONTRAST TECHNIQUE: Multidetector CT imaging of the chest, abdomen and pelvis was performed following the standard protocol during bolus administration of intravenous contrast. CONTRAST:  172mL ISOVUE-300 IOPAMIDOL (ISOVUE-300) INJECTION 61% COMPARISON:  09/27/2016 FINDINGS: CT CHEST FINDINGS Cardiovascular: The heart size appears normal. No pericardial effusion. Aortic atherosclerosis. Mediastinum/Nodes: No enlarged mediastinal, hilar, or axillary lymph nodes. Thyroid gland, trachea, and esophagus demonstrate no significant findings. Lungs/Pleura: No pleural effusion. Advanced changes of emphysema. No suspicious pulmonary nodules or masses. Musculoskeletal: No chest wall mass or suspicious bone lesions identified. CT ABDOMEN PELVIS FINDINGS Hepatobiliary: No focal liver abnormality identified. A stent is identified within the common bile duct. There is mild intrahepatic biliary dilatation with pneumobilia. Mild intrahepatic  biliary dilatation and pneumobilia. The previously described hypermetabolic nodule adjacent to the gallbladder fundus measures 7 mm, image 83 of series 2. Previously 1.5 cm. Pancreas: The pancreatic head mass is difficult to visualized/measure. The remaining portions of the pancreas appear normal. No pancreatic ductal dilatation. Spleen: Spleen is unremarkable Adrenals/Urinary Tract: The adrenal glands appear normal. The kidneys are unremarkable. The urinary bladder is normal. Stomach/Bowel: The stomach is normal. The small bowel loops have a normal course and caliber. Unremarkable appearance of the colon. No bowel obstruction. Vascular/Lymphatic: Aortic atherosclerosis. No aneurysm. No upper abdominal adenopathy. Reproductive: Prostate is unremarkable. Other: There is no ascites identified within the abdomen or pelvis. Musculoskeletal: No aggressive lytic or sclerotic bone lesions. IMPRESSION: 1. Pancreatic head mass is difficult to visualize and remeasure on today's exam. No definite evidence for local tumor progression compared with MRI from 07/08/2016. 2. No focal liver abnormalities identified on the current exam to correspond to the hypermetabolic foci identified on PET-CT from 09/27/2016 3. The hypermetabolic nodule associated with the gallbladder fundus is smaller on today's study. 4. No evidence for thoracic metastasis. 5.  Aortic Atherosclerosis (ICD10-I70.0). Emphysema (ICD10-J43.9). Electronically Signed   By: Kerby Moors M.D.   On: 01/03/2017 09:40    ASSESSMENT: Stage IV pancreatic cancer  PLAN:   1. Stage IV pancreatic cancer: Imaging, pathology, and Op note reviewed independently confirming stage IV disease with multiple peritoneal implants. CT scan results from January 03, 2017 reviewed independently with significant improvement of patient's disease burden and no evidence  of progressive disease. Patient's CA-19-9 has trended up slightly to 42, today's result is pending. Patient wishes to  proceed with palliative chemotherapy. Given patient's rash, gemcitabine was discontinued. Abraxane okay to use now that his bilirubin and liver enzymes are within normal limits.  Proceed with cycle 5, day 1 of Abraxane today. Return to clinic in 1 week for consideration of cycle 5, day 8. We will complete 6 cycles of chemotherapy and then consider a break if his CT scan remains with minimal evidence of disease. 2. Pain: Intermittent and mild. Patient reports he is no longer taking oxycodone.  3. Elevated liver enzymes: Resolved. Secondary to malignancy, monitor. 4. Hyponatremia: Decreased, but stable. Monitor. 5. Rash: Resolved. Discontinue gemcitabine as above. 6. Peripheral edema: Continue elevation nightly.  Patient expressed understanding and was in agreement with this plan. He also understands that He can call clinic at any time with any questions, concerns, or complaints.   Cancer Staging Pancreatic cancer [facility]) Staging form: Exocrine Pancreas, AJCC 8th Edition - Clinical stage from 09/25/2016: Stage IV (cTX, cNX, pM1) - Signed by Lloyd Huger, MD on 09/25/2016   Lloyd Huger, MD   01/26/2017 10:54 AM

## 2017-01-26 ENCOUNTER — Inpatient Hospital Stay (HOSPITAL_BASED_OUTPATIENT_CLINIC_OR_DEPARTMENT_OTHER): Payer: BLUE CROSS/BLUE SHIELD | Admitting: Oncology

## 2017-01-26 ENCOUNTER — Encounter: Payer: Self-pay | Admitting: Oncology

## 2017-01-26 ENCOUNTER — Inpatient Hospital Stay: Payer: BLUE CROSS/BLUE SHIELD

## 2017-01-26 VITALS — BP 120/82 | HR 97 | Temp 97.0°F | Resp 14 | Wt 119.0 lb

## 2017-01-26 DIAGNOSIS — R531 Weakness: Secondary | ICD-10-CM

## 2017-01-26 DIAGNOSIS — C250 Malignant neoplasm of head of pancreas: Secondary | ICD-10-CM | POA: Diagnosis not present

## 2017-01-26 DIAGNOSIS — R5383 Other fatigue: Secondary | ICD-10-CM

## 2017-01-26 DIAGNOSIS — C259 Malignant neoplasm of pancreas, unspecified: Secondary | ICD-10-CM

## 2017-01-26 DIAGNOSIS — I4891 Unspecified atrial fibrillation: Secondary | ICD-10-CM | POA: Diagnosis not present

## 2017-01-26 DIAGNOSIS — E871 Hypo-osmolality and hyponatremia: Secondary | ICD-10-CM

## 2017-01-26 DIAGNOSIS — C787 Secondary malignant neoplasm of liver and intrahepatic bile duct: Secondary | ICD-10-CM

## 2017-01-26 DIAGNOSIS — F17210 Nicotine dependence, cigarettes, uncomplicated: Secondary | ICD-10-CM

## 2017-01-26 DIAGNOSIS — J449 Chronic obstructive pulmonary disease, unspecified: Secondary | ICD-10-CM

## 2017-01-26 DIAGNOSIS — R5381 Other malaise: Secondary | ICD-10-CM

## 2017-01-26 DIAGNOSIS — I700 Atherosclerosis of aorta: Secondary | ICD-10-CM

## 2017-01-26 DIAGNOSIS — Z79899 Other long term (current) drug therapy: Secondary | ICD-10-CM | POA: Diagnosis not present

## 2017-01-26 DIAGNOSIS — R600 Localized edema: Secondary | ICD-10-CM

## 2017-01-26 LAB — COMPREHENSIVE METABOLIC PANEL
ALK PHOS: 313 U/L — AB (ref 38–126)
ALT: 40 U/L (ref 17–63)
ANION GAP: 8 (ref 5–15)
AST: 49 U/L — ABNORMAL HIGH (ref 15–41)
Albumin: 3.8 g/dL (ref 3.5–5.0)
BILIRUBIN TOTAL: 0.4 mg/dL (ref 0.3–1.2)
BUN: 7 mg/dL (ref 6–20)
CALCIUM: 9 mg/dL (ref 8.9–10.3)
CO2: 29 mmol/L (ref 22–32)
CREATININE: 0.56 mg/dL — AB (ref 0.61–1.24)
Chloride: 96 mmol/L — ABNORMAL LOW (ref 101–111)
Glucose, Bld: 85 mg/dL (ref 65–99)
Potassium: 4.4 mmol/L (ref 3.5–5.1)
Sodium: 133 mmol/L — ABNORMAL LOW (ref 135–145)
TOTAL PROTEIN: 6.7 g/dL (ref 6.5–8.1)

## 2017-01-26 LAB — CBC WITH DIFFERENTIAL/PLATELET
Basophils Absolute: 0 10*3/uL (ref 0–0.1)
Basophils Relative: 0 %
EOS ABS: 0.3 10*3/uL (ref 0–0.7)
Eosinophils Relative: 5 %
HEMATOCRIT: 44.9 % (ref 40.0–52.0)
HEMOGLOBIN: 15.2 g/dL (ref 13.0–18.0)
LYMPHS ABS: 1.7 10*3/uL (ref 1.0–3.6)
LYMPHS PCT: 24 %
MCH: 32.1 pg (ref 26.0–34.0)
MCHC: 33.9 g/dL (ref 32.0–36.0)
MCV: 94.6 fL (ref 80.0–100.0)
MONOS PCT: 15 %
Monocytes Absolute: 1 10*3/uL (ref 0.2–1.0)
NEUTROS PCT: 56 %
Neutro Abs: 3.9 10*3/uL (ref 1.4–6.5)
Platelets: 382 10*3/uL (ref 150–440)
RBC: 4.74 MIL/uL (ref 4.40–5.90)
RDW: 17.9 % — ABNORMAL HIGH (ref 11.5–14.5)
WBC: 7 10*3/uL (ref 3.8–10.6)

## 2017-01-26 MED ORDER — HEPARIN SOD (PORK) LOCK FLUSH 100 UNIT/ML IV SOLN: 500.0000 [IU] | Freq: Once | INTRAVENOUS | Status: DC | PRN

## 2017-01-26 MED ORDER — PROCHLORPERAZINE MALEATE 10 MG PO TABS
10.0000 mg | ORAL_TABLET | Freq: Once | ORAL | Status: AC
  Administered 2017-01-26: 10 mg via ORAL
  Filled 2017-01-26: qty 1

## 2017-01-26 MED ORDER — SODIUM CHLORIDE 0.9 % IV SOLN
Freq: Once | INTRAVENOUS | Status: AC
  Administered 2017-01-26: 11:00:00 via INTRAVENOUS
  Filled 2017-01-26: qty 1000

## 2017-01-26 MED ORDER — PACLITAXEL PROTEIN-BOUND CHEMO INJECTION 100 MG
200.0000 mg | Freq: Once | INTRAVENOUS | Status: AC
  Administered 2017-01-26: 200 mg via INTRAVENOUS
  Filled 2017-01-26: qty 40

## 2017-01-26 NOTE — Progress Notes (Signed)
Patient here for follow up with labs and treatment with abraxane. He states that he feels "ok" today. He reports some pain in both of his legs and attributes this to the swelling in his feet and ankles, and states that walking makes it worse.

## 2017-01-27 LAB — CANCER ANTIGEN 19-9: CA 19-9: 54 U/mL — ABNORMAL HIGH (ref 0–35)

## 2017-01-31 NOTE — Progress Notes (Signed)
[place]  Telephone:([phone] [phone]  ID: Benjamin Diaz. OB: Jun 13, 1960  MR#: [id number]  ENI#:[id number]  Patient Care Team: Patient, [name] as PCP - General (General Practice) [name], MD as Consulting Physician (Oncology) [name], MD as Consulting Physician (Gastroenterology)  CHIEF COMPLAINT: Stage IV pancreatic cancer  INTERVAL HISTORY: Patient returns to clinic today for further evaluation and consideration of cycle 5, day 8 single agent Abraxane. He does not complain of abdominal pain today.  He continues to have persistent weakness and fatigue, but otherwise feels well.  He has some mild peripheral edema, which resolves with elevation. He has no neurologic complaints. He denies any fevers. His appetite has improved and is weight is stable. He has no chest pain or shortness of breath. He denies any nausea, vomiting, constipation, or diarrhea. He has no urinary complaints. Patient offers no further specific complaints today.  REVIEW OF SYSTEMS:   Review of Systems  Constitutional: Positive for malaise/fatigue. Negative for fever and weight loss.  Respiratory: Negative.  Negative for cough and shortness of breath.   Cardiovascular: Positive for leg swelling. Negative for chest pain.  Gastrointestinal: Negative for abdominal pain, nausea and vomiting.  Genitourinary: Negative.   Musculoskeletal: Negative.   Skin: Negative for itching and rash.  Neurological: Positive for weakness. Negative for sensory change.  Psychiatric/Behavioral: Negative.  The patient is not nervous/anxious.     As per HPI. Otherwise, a complete review of systems is negative.  PAST MEDICAL HISTORY: Past Medical History:  Diagnosis Date  . Arthritis   . Asthma   . Atrial fibrillation ([place])   . Cancer ([place])   . Closed left ankle fracture age 50  . COPD (chronic obstructive pulmonary disease) ([place])   . Dysrhythmia   . Pneumonia 2011  . Right  shoulder injury 09/27/2016  . Sciatica    left leg pinched nerve numb at times about once a year    PAST SURGICAL HISTORY: Past Surgical History:  Procedure Laterality Date  . KNEE SURGERY Right 2002   arthroscopy  . SHOULDER SURGERY Right 2002    FAMILY HISTORY: Family History  Problem Relation Age of Onset  . Heart disease Father     ADVANCED DIRECTIVES (Y/N):  N  HEALTH MAINTENANCE: Social History   Tobacco Use  . Smoking status: Current Every Day Smoker    Packs/day: 0.50    Years: 40.00    Pack years: 20.00    Types: Cigarettes  . Smokeless tobacco: Never Used  Substance Use Topics  . Alcohol use: Yes    Alcohol/week: 4.2 oz    Types: 7 Cans of beer per week    Comment: last drink 5 weeeks ago  april 2018  . Drug use: No     Colonoscopy:  PAP:  Bone density:  Lipid panel:  Allergies  Allergen Reactions  . No Known Allergies     Current Outpatient Medications  Medication Sig Dispense Refill  . albuterol (PROVENTIL HFA;VENTOLIN HFA) 108 (90 Base) MCG/ACT inhaler Inhale 1-2 puffs into the lungs every 6 (six) hours as needed for wheezing or shortness of breath. 1 Inhaler 6  . Ipratropium-Albuterol (COMBIVENT RESPIMAT) 20-100 MCG/ACT AERS respimat Inhale 1 puff into the lungs 4 (four) times daily.     No current facility-administered medications for this visit.     OBJECTIVE: Vitals:   02/02/17 0855  BP: 117/76  Pulse: 78  Resp: 20  Temp: (!) 96 F (35.6 C)  Body mass index is 16.55 kg/m.    ECOG FS:1 - Symptomatic but completely ambulatory  General: Thin, no acute distress. Eyes: Pink conjunctiva, anicteric sclera. Lungs: Clear to auscultation bilaterally. Heart: Regular rate and rhythm. No rubs, murmurs, or gallops. Abdomen: Soft, nontender, nondistended. No organomegaly noted, normoactive bowel sounds. Musculoskeletal: No edema, cyanosis, or clubbing. Neuro: Alert, answering all questions appropriately. Cranial nerves grossly  intact. Skin: Maculopapular rash noted on torso. Psych: Normal affect.   LAB RESULTS:  Lab Results  Component Value Date   NA 132 (L) 02/02/2017   K 4.3 02/02/2017   CL 96 (L) 02/02/2017   CO2 29 02/02/2017   GLUCOSE 113 (H) 02/02/2017   BUN 8 02/02/2017   CREATININE 0.67 02/02/2017   CALCIUM 9.1 02/02/2017   PROT 6.8 02/02/2017   ALBUMIN 3.7 02/02/2017   AST 50 (H) 02/02/2017   ALT 45 02/02/2017   ALKPHOS 306 (H) 02/02/2017   BILITOT 0.6 02/02/2017   GFRNONAA >60 02/02/2017   GFRAA >60 02/02/2017    Lab Results  Component Value Date   WBC 5.8 02/02/2017   NEUTROABS 3.1 02/02/2017   HGB 14.8 02/02/2017   HCT 43.3 02/02/2017   MCV 94.4 02/02/2017   PLT 336 02/02/2017      STUDIES: No results found.  ASSESSMENT: Stage IV pancreatic cancer  PLAN:   1. Stage IV pancreatic cancer: Imaging, pathology, and Op note reviewed independently confirming stage IV disease with multiple peritoneal implants. CT scan results from January 03, 2017 reviewed independently with significant improvement of patient's disease burden and no evidence of progressive disease. Patient's CA-19-9 has trended up slightly to 54, today's result is pending. Patient wishes to proceed with palliative chemotherapy. Given patient's rash, gemcitabine was discontinued. Abraxane okay to use now that his bilirubin and liver enzymes are within normal limits.  Proceed with cycle 5, day 8 of Abraxane today. Return to clinic in 1 week for consideration of cycle 5, day 15.  Plan to reimage at the conclusion of cycle 6, but if his tumor markers continue to trend up may reimage sooner.   2. Pain: Intermittent and mild. Patient reports he is no longer taking oxycodone.  3. Elevated liver enzymes: Resolved. Secondary to malignancy, monitor. 4. Hyponatremia: Decreased, but stable. Monitor. 5. Rash: Resolved. Discontinue gemcitabine as above. 6. Peripheral edema: Continue elevation nightly.  Patient expressed  understanding and was in agreement with this plan. He also understands that He can call clinic at any time with any questions, concerns, or complaints.   Cancer Staging Pancreatic cancer [facility]) Staging form: Exocrine Pancreas, AJCC 8th Edition - Clinical stage from 09/25/2016: Stage IV (cTX, cNX, pM1) - Signed by Lloyd Huger, MD on 09/25/2016   Lloyd Huger, MD   02/02/2017 2:09 PM

## 2017-02-02 ENCOUNTER — Inpatient Hospital Stay: Payer: BLUE CROSS/BLUE SHIELD | Attending: Oncology | Admitting: Oncology

## 2017-02-02 ENCOUNTER — Inpatient Hospital Stay: Payer: BLUE CROSS/BLUE SHIELD

## 2017-02-02 ENCOUNTER — Encounter: Payer: Self-pay | Admitting: Oncology

## 2017-02-02 VITALS — BP 117/76 | HR 78 | Temp 96.0°F | Resp 20 | Wt 122.0 lb

## 2017-02-02 DIAGNOSIS — R531 Weakness: Secondary | ICD-10-CM

## 2017-02-02 DIAGNOSIS — R21 Rash and other nonspecific skin eruption: Secondary | ICD-10-CM | POA: Diagnosis not present

## 2017-02-02 DIAGNOSIS — R5381 Other malaise: Secondary | ICD-10-CM | POA: Diagnosis not present

## 2017-02-02 DIAGNOSIS — I4891 Unspecified atrial fibrillation: Secondary | ICD-10-CM | POA: Insufficient documentation

## 2017-02-02 DIAGNOSIS — Z5111 Encounter for antineoplastic chemotherapy: Secondary | ICD-10-CM | POA: Diagnosis not present

## 2017-02-02 DIAGNOSIS — C787 Secondary malignant neoplasm of liver and intrahepatic bile duct: Secondary | ICD-10-CM | POA: Diagnosis not present

## 2017-02-02 DIAGNOSIS — R600 Localized edema: Secondary | ICD-10-CM | POA: Insufficient documentation

## 2017-02-02 DIAGNOSIS — Z79899 Other long term (current) drug therapy: Secondary | ICD-10-CM | POA: Insufficient documentation

## 2017-02-02 DIAGNOSIS — Z87828 Personal history of other (healed) physical injury and trauma: Secondary | ICD-10-CM | POA: Insufficient documentation

## 2017-02-02 DIAGNOSIS — M25572 Pain in left ankle and joints of left foot: Secondary | ICD-10-CM | POA: Insufficient documentation

## 2017-02-02 DIAGNOSIS — C250 Malignant neoplasm of head of pancreas: Secondary | ICD-10-CM | POA: Insufficient documentation

## 2017-02-02 DIAGNOSIS — F17210 Nicotine dependence, cigarettes, uncomplicated: Secondary | ICD-10-CM | POA: Diagnosis not present

## 2017-02-02 DIAGNOSIS — C259 Malignant neoplasm of pancreas, unspecified: Secondary | ICD-10-CM

## 2017-02-02 DIAGNOSIS — J449 Chronic obstructive pulmonary disease, unspecified: Secondary | ICD-10-CM | POA: Diagnosis not present

## 2017-02-02 DIAGNOSIS — R5383 Other fatigue: Secondary | ICD-10-CM | POA: Diagnosis not present

## 2017-02-02 DIAGNOSIS — E871 Hypo-osmolality and hyponatremia: Secondary | ICD-10-CM | POA: Diagnosis not present

## 2017-02-02 DIAGNOSIS — M1990 Unspecified osteoarthritis, unspecified site: Secondary | ICD-10-CM

## 2017-02-02 LAB — CBC WITH DIFFERENTIAL/PLATELET
BASOS PCT: 0 %
Basophils Absolute: 0 10*3/uL (ref 0–0.1)
EOS ABS: 0.7 10*3/uL (ref 0–0.7)
Eosinophils Relative: 12 %
HEMATOCRIT: 43.3 % (ref 40.0–52.0)
Hemoglobin: 14.8 g/dL (ref 13.0–18.0)
Lymphocytes Relative: 30 %
Lymphs Abs: 1.7 10*3/uL (ref 1.0–3.6)
MCH: 32.2 pg (ref 26.0–34.0)
MCHC: 34.2 g/dL (ref 32.0–36.0)
MCV: 94.4 fL (ref 80.0–100.0)
MONO ABS: 0.3 10*3/uL (ref 0.2–1.0)
MONOS PCT: 5 %
Neutro Abs: 3.1 10*3/uL (ref 1.4–6.5)
Neutrophils Relative %: 53 %
Platelets: 336 10*3/uL (ref 150–440)
RBC: 4.59 MIL/uL (ref 4.40–5.90)
RDW: 17.3 % — AB (ref 11.5–14.5)
WBC: 5.8 10*3/uL (ref 3.8–10.6)

## 2017-02-02 LAB — COMPREHENSIVE METABOLIC PANEL
ALBUMIN: 3.7 g/dL (ref 3.5–5.0)
ALT: 45 U/L (ref 17–63)
AST: 50 U/L — AB (ref 15–41)
Alkaline Phosphatase: 306 U/L — ABNORMAL HIGH (ref 38–126)
Anion gap: 7 (ref 5–15)
BUN: 8 mg/dL (ref 6–20)
CALCIUM: 9.1 mg/dL (ref 8.9–10.3)
CO2: 29 mmol/L (ref 22–32)
CREATININE: 0.67 mg/dL (ref 0.61–1.24)
Chloride: 96 mmol/L — ABNORMAL LOW (ref 101–111)
GFR calc Af Amer: >60 mL/min (ref >60)
GFR calc non Af Amer: >60 mL/min (ref >60)
GLUCOSE: 113 mg/dL — AB (ref 65–99)
Potassium: 4.3 mmol/L (ref 3.5–5.1)
SODIUM: 132 mmol/L — AB (ref 135–145)
Total Bilirubin: 0.6 mg/dL (ref 0.3–1.2)
Total Protein: 6.8 g/dL (ref 6.5–8.1)

## 2017-02-02 MED ORDER — PACLITAXEL PROTEIN-BOUND CHEMO INJECTION 100 MG
200.0000 mg | Freq: Once | INTRAVENOUS | Status: AC
  Administered 2017-02-02: 200 mg via INTRAVENOUS
  Filled 2017-02-02: qty 40

## 2017-02-02 MED ORDER — PROCHLORPERAZINE MALEATE 10 MG PO TABS
10.0000 mg | ORAL_TABLET | Freq: Once | ORAL | Status: AC
  Administered 2017-02-02: 10 mg via ORAL
  Filled 2017-02-02: qty 1

## 2017-02-02 MED ORDER — SODIUM CHLORIDE 0.9 % IV SOLN
Freq: Once | INTRAVENOUS | Status: AC
  Administered 2017-02-02: 10:00:00 via INTRAVENOUS
  Filled 2017-02-02: qty 1000

## 2017-02-02 NOTE — Progress Notes (Signed)
Patient denies any concerns today.  

## 2017-02-03 LAB — CANCER ANTIGEN 19-9: CAN 19-9: 62 U/mL — AB (ref 0–35)

## 2017-02-06 NOTE — Progress Notes (Signed)
[place]  [phone] [phone]  ID: Benjamin Diaz. OB: 1960-08-01  MR#: [id number]  CNO#:[id number]  Patient Care Team: Patient, [name] as PCP - General (General Practice) [name], MD as Consulting Physician (Oncology) [name], MD as Consulting Physician (Gastroenterology)  CHIEF COMPLAINT: Stage IV pancreatic cancer  INTERVAL HISTORY: Patient returns to clinic today for further evaluation and consideration of cycle 5, day 15 of single agent Abraxane. He does not complain of abdominal pain today.  He continues to have persistent weakness and fatigue, but otherwise feels well.  He has some mild peripheral edema, which resolves with elevation.  He has intermittent left ankle pain from a previous injury.  He has no neurologic complaints. He denies any fevers. His appetite has improved and is weight is stable. He has no chest pain or shortness of breath. He denies any nausea, vomiting, constipation, or diarrhea. He has no urinary complaints. Patient offers no further specific complaints today.  REVIEW OF SYSTEMS:   Review of Systems  Constitutional: Positive for malaise/fatigue. Negative for fever and weight loss.  Respiratory: Negative.  Negative for cough and shortness of breath.   Cardiovascular: Positive for leg swelling. Negative for chest pain.  Gastrointestinal: Negative.  Negative for abdominal pain, nausea and vomiting.  Genitourinary: Negative.   Musculoskeletal: Positive for joint pain.  Skin: Negative.  Negative for itching and rash.  Neurological: Positive for weakness. Negative for sensory change.  Psychiatric/Behavioral: Negative.  The patient is not nervous/anxious.     As per HPI. Otherwise, a complete review of systems is negative.  PAST MEDICAL HISTORY: Past Medical History:  Diagnosis Date  . Arthritis   . Asthma   . Atrial fibrillation ([place])   . Cancer ([place])   . Closed left ankle fracture age 56    . COPD (chronic obstructive pulmonary disease) ([place])   . Dysrhythmia   . Pneumonia 2011  . Right shoulder injury 09/27/2016  . Sciatica    left leg pinched nerve numb at times about once a year    PAST SURGICAL HISTORY: Past Surgical History:  Procedure Laterality Date  . KNEE SURGERY Right 2002   arthroscopy  . SHOULDER SURGERY Right 2002    FAMILY HISTORY: Family History  Problem Relation Age of Onset  . Heart disease Father     ADVANCED DIRECTIVES (Y/N):  N  HEALTH MAINTENANCE: Social History   Tobacco Use  . Smoking status: Current Every Day Smoker    Packs/day: 0.50    Years: 40.00    Pack years: 20.00    Types: Cigarettes  . Smokeless tobacco: Never Used  Substance Use Topics  . Alcohol use: Yes    Alcohol/week: 4.2 oz    Types: 7 Cans of beer per week    Comment: last drink 5 weeeks ago  april 2018  . Drug use: No     Colonoscopy:  PAP:  Bone density:  Lipid panel:  Allergies  Allergen Reactions  . No Known Allergies     Current Outpatient Medications  Medication Sig Dispense Refill  . albuterol (PROVENTIL HFA;VENTOLIN HFA) 108 (90 Base) MCG/ACT inhaler Inhale 1-2 puffs into the lungs every 6 (six) hours as needed for wheezing or shortness of breath. 1 Inhaler 6  . Ipratropium-Albuterol (COMBIVENT RESPIMAT) 20-100 MCG/ACT AERS respimat Inhale 1 puff into the lungs 4 (four) times daily.     No current facility-administered medications for this visit.     OBJECTIVE: Vitals:  02/09/17 1121  BP: 137/87  Pulse: 82  Resp: 20  Temp: (!) 95.9 F (35.5 C)     Body mass index is 16.67 kg/m.    ECOG FS:1 - Symptomatic but completely ambulatory  General: Thin, no acute distress. Eyes: Pink conjunctiva, anicteric sclera. Lungs: Clear to auscultation bilaterally. Heart: Regular rate and rhythm. No rubs, murmurs, or gallops. Abdomen: Soft, nontender, nondistended. No organomegaly noted, normoactive bowel sounds. Musculoskeletal: No edema,  cyanosis, or clubbing. Neuro: Alert, answering all questions appropriately. Cranial nerves grossly intact. Skin: Maculopapular rash noted on torso. Psych: Normal affect.   LAB RESULTS:  Lab Results  Component Value Date   NA 131 (L) 02/09/2017   K 4.3 02/09/2017   CL 96 (L) 02/09/2017   CO2 29 02/09/2017   GLUCOSE 101 (H) 02/09/2017   BUN <5 (L) 02/09/2017   CREATININE 0.57 (L) 02/09/2017   CALCIUM 8.8 (L) 02/09/2017   PROT 6.5 02/09/2017   ALBUMIN 3.7 02/09/2017   AST 37 02/09/2017   ALT 42 02/09/2017   ALKPHOS 283 (H) 02/09/2017   BILITOT 0.7 02/09/2017   GFRNONAA >60 02/09/2017   GFRAA >60 02/09/2017    Lab Results  Component Value Date   WBC 6.0 02/09/2017   NEUTROABS 2.9 02/09/2017   HGB 14.1 02/09/2017   HCT 41.6 02/09/2017   MCV 95.7 02/09/2017   PLT 334 02/09/2017      STUDIES: No results found.  ASSESSMENT: Stage IV pancreatic cancer  PLAN:   1. Stage IV pancreatic cancer: Imaging, pathology, and Op note reviewed independently confirming stage IV disease with multiple peritoneal implants. CT scan results from January 03, 2017 reviewed independently with significant improvement of patient's disease burden and no evidence of progressive disease. Patient's CA-19-9 continues to slowly trend up and is now 62, today's result is pending. Patient wishes to proceed with palliative chemotherapy. Given patient's rash, gemcitabine was discontinued. Abraxane okay to use now that his bilirubin and liver enzymes are within normal limits.  Proceed with cycle 5, day 15 of Abraxane today. Return to clinic in 2 weeks for consideration of cycle 6, day 1.  Plan to reimage at the conclusion of cycle 6, but if his tumor markers continue to trend up may reimage sooner.   2. Pain: Intermittent and mild. Patient reports he is no longer taking oxycodone.  3. Elevated liver enzymes: Resolved. Secondary to malignancy, monitor. 4. Hyponatremia: Decreased, but stable. Monitor. 5. Rash:  Resolved. Discontinue gemcitabine as above. 6. Peripheral edema: Continue elevation nightly.  Patient expressed understanding and was in agreement with this plan. He also understands that He can call clinic at any time with any questions, concerns, or complaints.   Cancer Staging Pancreatic cancer [facility]) Staging form: Exocrine Pancreas, AJCC 8th Edition - Clinical stage from 09/25/2016: Stage IV (cTX, cNX, pM1) - Signed by Lloyd Huger, MD on 09/25/2016   Lloyd Huger, MD   02/09/2017 3:25 PM

## 2017-02-08 ENCOUNTER — Ambulatory Visit: Payer: Self-pay

## 2017-02-09 ENCOUNTER — Inpatient Hospital Stay: Payer: BLUE CROSS/BLUE SHIELD

## 2017-02-09 ENCOUNTER — Encounter: Payer: Self-pay | Admitting: Oncology

## 2017-02-09 ENCOUNTER — Other Ambulatory Visit: Payer: Self-pay

## 2017-02-09 ENCOUNTER — Inpatient Hospital Stay (HOSPITAL_BASED_OUTPATIENT_CLINIC_OR_DEPARTMENT_OTHER): Payer: BLUE CROSS/BLUE SHIELD | Admitting: Oncology

## 2017-02-09 VITALS — BP 137/87 | HR 82 | Temp 95.9°F | Resp 20 | Wt 122.9 lb

## 2017-02-09 DIAGNOSIS — R21 Rash and other nonspecific skin eruption: Secondary | ICD-10-CM | POA: Diagnosis not present

## 2017-02-09 DIAGNOSIS — R5383 Other fatigue: Secondary | ICD-10-CM | POA: Diagnosis not present

## 2017-02-09 DIAGNOSIS — R531 Weakness: Secondary | ICD-10-CM | POA: Diagnosis not present

## 2017-02-09 DIAGNOSIS — M25572 Pain in left ankle and joints of left foot: Secondary | ICD-10-CM

## 2017-02-09 DIAGNOSIS — C259 Malignant neoplasm of pancreas, unspecified: Secondary | ICD-10-CM

## 2017-02-09 DIAGNOSIS — M1990 Unspecified osteoarthritis, unspecified site: Secondary | ICD-10-CM

## 2017-02-09 DIAGNOSIS — R5381 Other malaise: Secondary | ICD-10-CM | POA: Diagnosis not present

## 2017-02-09 DIAGNOSIS — Z87828 Personal history of other (healed) physical injury and trauma: Secondary | ICD-10-CM

## 2017-02-09 DIAGNOSIS — Z79899 Other long term (current) drug therapy: Secondary | ICD-10-CM

## 2017-02-09 DIAGNOSIS — C787 Secondary malignant neoplasm of liver and intrahepatic bile duct: Secondary | ICD-10-CM

## 2017-02-09 DIAGNOSIS — I4891 Unspecified atrial fibrillation: Secondary | ICD-10-CM

## 2017-02-09 DIAGNOSIS — F17210 Nicotine dependence, cigarettes, uncomplicated: Secondary | ICD-10-CM

## 2017-02-09 DIAGNOSIS — R600 Localized edema: Secondary | ICD-10-CM | POA: Diagnosis not present

## 2017-02-09 DIAGNOSIS — C250 Malignant neoplasm of head of pancreas: Secondary | ICD-10-CM

## 2017-02-09 DIAGNOSIS — E871 Hypo-osmolality and hyponatremia: Secondary | ICD-10-CM | POA: Diagnosis not present

## 2017-02-09 DIAGNOSIS — J449 Chronic obstructive pulmonary disease, unspecified: Secondary | ICD-10-CM

## 2017-02-09 LAB — COMPREHENSIVE METABOLIC PANEL
ALBUMIN: 3.7 g/dL (ref 3.5–5.0)
ALK PHOS: 283 U/L — AB (ref 38–126)
ALT: 42 U/L (ref 17–63)
AST: 37 U/L (ref 15–41)
Anion gap: 6 (ref 5–15)
BILIRUBIN TOTAL: 0.7 mg/dL (ref 0.3–1.2)
CALCIUM: 8.8 mg/dL — AB (ref 8.9–10.3)
CO2: 29 mmol/L (ref 22–32)
Chloride: 96 mmol/L — ABNORMAL LOW (ref 101–111)
Creatinine, Ser: 0.57 mg/dL — ABNORMAL LOW (ref 0.61–1.24)
GFR calc Af Amer: >60 mL/min (ref >60)
GLUCOSE: 101 mg/dL — AB (ref 65–99)
Potassium: 4.3 mmol/L (ref 3.5–5.1)
Sodium: 131 mmol/L — ABNORMAL LOW (ref 135–145)
Total Protein: 6.5 g/dL (ref 6.5–8.1)

## 2017-02-09 LAB — CBC WITH DIFFERENTIAL/PLATELET
BASOS ABS: 0 10*3/uL (ref 0–0.1)
BASOS PCT: 0 %
EOS ABS: 0.9 10*3/uL — AB (ref 0–0.7)
EOS PCT: 15 %
HCT: 41.6 % (ref 40.0–52.0)
HEMOGLOBIN: 14.1 g/dL (ref 13.0–18.0)
Lymphocytes Relative: 28 %
Lymphs Abs: 1.7 10*3/uL (ref 1.0–3.6)
MCH: 32.4 pg (ref 26.0–34.0)
MCHC: 33.9 g/dL (ref 32.0–36.0)
MCV: 95.7 fL (ref 80.0–100.0)
Monocytes Absolute: 0.5 10*3/uL (ref 0.2–1.0)
Monocytes Relative: 8 %
Neutro Abs: 2.9 10*3/uL (ref 1.4–6.5)
Neutrophils Relative %: 49 %
Platelets: 334 10*3/uL (ref 150–440)
RBC: 4.35 MIL/uL — ABNORMAL LOW (ref 4.40–5.90)
RDW: 16.8 % — AB (ref 11.5–14.5)
WBC: 6 10*3/uL (ref 3.8–10.6)

## 2017-02-09 MED ORDER — PROCHLORPERAZINE MALEATE 10 MG PO TABS
10.0000 mg | ORAL_TABLET | Freq: Once | ORAL | Status: AC
  Administered 2017-02-09: 10 mg via ORAL
  Filled 2017-02-09: qty 1

## 2017-02-09 MED ORDER — PACLITAXEL PROTEIN-BOUND CHEMO INJECTION 100 MG
200.0000 mg | Freq: Once | INTRAVENOUS | Status: AC
Start: 2017-02-09 — End: 2017-02-09
  Administered 2017-02-09: 200 mg via INTRAVENOUS
  Filled 2017-02-09: qty 40

## 2017-02-09 MED ORDER — SODIUM CHLORIDE 0.9 % IV SOLN
Freq: Once | INTRAVENOUS | Status: AC
  Administered 2017-02-09: 12:00:00 via INTRAVENOUS
  Filled 2017-02-09: qty 1000

## 2017-02-09 NOTE — Progress Notes (Signed)
Patient denies any concerns today.  

## 2017-02-10 LAB — CANCER ANTIGEN 19-9: CAN 19-9: 49 U/mL — AB (ref 0–35)

## 2017-02-22 NOTE — Progress Notes (Deleted)
[place]  [phone] [phone]  ID: Benjamin Diaz. OB: 02-Aug-1960  MR#: [id number]  KJZ#:[id number]  Patient Care Team: Patient, [name] as PCP - General (General Practice) [name], MD as Consulting Physician (Oncology) [name], MD as Consulting Physician (Gastroenterology)  CHIEF COMPLAINT: Stage IV pancreatic cancer  INTERVAL HISTORY: Patient returns to clinic today for further evaluation and consideration of cycle 5, day 15 of single agent Abraxane. He does not complain of abdominal pain today.  He continues to have persistent weakness and fatigue, but otherwise feels well.  He has some mild peripheral edema, which resolves with elevation.  He has intermittent left ankle pain from a previous injury.  He has no neurologic complaints. He denies any fevers. His appetite has improved and is weight is stable. He has no chest pain or shortness of breath. He denies any nausea, vomiting, constipation, or diarrhea. He has no urinary complaints. Patient offers no further specific complaints today.  REVIEW OF SYSTEMS:   Review of Systems  Constitutional: Positive for malaise/fatigue. Negative for fever and weight loss.  Respiratory: Negative.  Negative for cough and shortness of breath.   Cardiovascular: Positive for leg swelling. Negative for chest pain.  Gastrointestinal: Negative.  Negative for abdominal pain, nausea and vomiting.  Genitourinary: Negative.   Musculoskeletal: Positive for joint pain.  Skin: Negative.  Negative for itching and rash.  Neurological: Positive for weakness. Negative for sensory change.  Psychiatric/Behavioral: Negative.  The patient is not nervous/anxious.     As per HPI. Otherwise, a complete review of systems is negative.  PAST MEDICAL HISTORY: Past Medical History:  Diagnosis Date  . Arthritis   . Asthma   . Atrial fibrillation ([place])   . Cancer ([place])   . Closed left ankle fracture age 58    . COPD (chronic obstructive pulmonary disease) ([place])   . Dysrhythmia   . Pneumonia 2011  . Right shoulder injury 09/27/2016  . Sciatica    left leg pinched nerve numb at times about once a year    PAST SURGICAL HISTORY: Past Surgical History:  Procedure Laterality Date  . BILIARY STENT PLACEMENT N/A 09/01/2016   Procedure: BILIARY STENT PLACEMENT;  Surgeon: [name], MD;  Location: [facility];  Service: Endoscopy;  Laterality: N/A;  . DIAGNOSTIC LAPAROSCOPIC LIVER BIOPSY  09/16/2016   Procedure: DIAGNOSTIC LAPAROSCOPIC LIVER BIOPSY;  Surgeon: [name], MD;  Location: [place];  Service: General;;  . ERCP N/A 07/09/2016   Procedure: ENDOSCOPIC RETROGRADE CHOLANGIOPANCREATOGRAPHY (ERCP);  Surgeon: [name], MD;  Location: [facility];  Service: Endoscopy;  Laterality: N/A;  . ERCP N/A 09/01/2016   Procedure: ENDOSCOPIC RETROGRADE CHOLANGIOPANCREATOGRAPHY (ERCP);  Surgeon: [name], MD;  Location: [facility];  Service: Endoscopy;  Laterality: N/A;  . ESOPHAGOGASTRODUODENOSCOPY (EGD) WITH PROPOFOL N/A 09/01/2016   Procedure: ESOPHAGOGASTRODUODENOSCOPY (EGD) WITH PROPOFOL;  Surgeon: [name], MD;  [facility];  Service: Gastroenterology;  Laterality: N/A;  . EUS N/A 09/01/2016   Procedure: ESOPHAGEAL ENDOSCOPIC ULTRASOUND (EUS) RADIAL;  Surgeon: [name], MD;  [facility];  Service: Endoscopy;  Laterality: N/A;  . FINE NEEDLE ASPIRATION N/A 09/01/2016   Procedure: FINE NEEDLE ASPIRATION (FNA) RADIAL;  Surgeon: [name], MD;  [facility];  Service: Endoscopy;  Laterality: N/A;  . FOREIGN BODY REMOVAL N/A 09/01/2016   Procedure: FOREIGN BODY REMOVAL;  Surgeon: [name], MD;  [facility];  Service: Gastroenterology;  Laterality: N/A;  . KNEE SURGERY Right 2002  arthroscopy  . SHOULDER SURGERY Right 2002    FAMILY HISTORY: Family History  Problem Relation Age of Onset  . Heart disease Father     ADVANCED  DIRECTIVES (Y/N):  N  HEALTH MAINTENANCE: Social History   Tobacco Use  . Smoking status: Current Every Day Smoker    Packs/day: 0.50    Years: 40.00    Pack years: 20.00    Types: Cigarettes  . Smokeless tobacco: Never Used  Substance Use Topics  . Alcohol use: Yes    Alcohol/week: 4.2 oz    Types: 7 Cans of beer per week    Comment: last drink 5 weeeks ago  april 2018  . Drug use: No     Colonoscopy:  PAP:  Bone density:  Lipid panel:  Allergies  Allergen Reactions  . No Known Allergies     Current Outpatient Medications  Medication Sig Dispense Refill  . albuterol (PROVENTIL HFA;VENTOLIN HFA) 108 (90 Base) MCG/ACT inhaler Inhale 1-2 puffs into the lungs every 6 (six) hours as needed for wheezing or shortness of breath. 1 Inhaler 6  . Ipratropium-Albuterol (COMBIVENT RESPIMAT) 20-100 MCG/ACT AERS respimat Inhale 1 puff into the lungs 4 (four) times daily.     No current facility-administered medications for this visit.     OBJECTIVE: There were no vitals filed for this visit.   There is no height or weight on file to calculate BMI.    ECOG FS:1 - Symptomatic but completely ambulatory  General: Thin, no acute distress. Eyes: Pink conjunctiva, anicteric sclera. Lungs: Clear to auscultation bilaterally. Heart: Regular rate and rhythm. No rubs, murmurs, or gallops. Abdomen: Soft, nontender, nondistended. No organomegaly noted, normoactive bowel sounds. Musculoskeletal: No edema, cyanosis, or clubbing. Neuro: Alert, answering all questions appropriately. Cranial nerves grossly intact. Skin: Maculopapular rash noted on torso. Psych: Normal affect.   LAB RESULTS:  Lab Results  Component Value Date   NA 131 (L) 02/09/2017   K 4.3 02/09/2017   CL 96 (L) 02/09/2017   CO2 29 02/09/2017   GLUCOSE 101 (H) 02/09/2017   BUN <5 (L) 02/09/2017   CREATININE 0.57 (L) 02/09/2017   CALCIUM 8.8 (L) 02/09/2017   PROT 6.5 02/09/2017   ALBUMIN 3.7 02/09/2017   AST 37  02/09/2017   ALT 42 02/09/2017   ALKPHOS 283 (H) 02/09/2017   BILITOT 0.7 02/09/2017   GFRNONAA >60 02/09/2017   GFRAA >60 02/09/2017    Lab Results  Component Value Date   WBC 6.0 02/09/2017   NEUTROABS 2.9 02/09/2017   HGB 14.1 02/09/2017   HCT 41.6 02/09/2017   MCV 95.7 02/09/2017   PLT 334 02/09/2017      STUDIES: No results found.  ASSESSMENT: Stage IV pancreatic cancer  PLAN:   1. Stage IV pancreatic cancer: Imaging, pathology, and Op note reviewed independently confirming stage IV disease with multiple peritoneal implants. CT scan results from January 03, 2017 reviewed independently with significant improvement of patient's disease burden and no evidence of progressive disease. Patient's CA-19-9 continues to slowly trend up and is now 62, today's result is pending. Patient wishes to proceed with palliative chemotherapy. Given patient's rash, gemcitabine was discontinued. Abraxane okay to use now that his bilirubin and liver enzymes are within normal limits.  Proceed with cycle 5, day 15 of Abraxane today. Return to clinic in 2 weeks for consideration of cycle 6, day 1.  Plan to reimage at the conclusion of cycle 6, but if his tumor markers continue to trend up may  reimage sooner.   2. Pain: Intermittent and mild. Patient reports he is no longer taking oxycodone.  3. Elevated liver enzymes: Resolved. Secondary to malignancy, monitor. 4. Hyponatremia: Decreased, but stable. Monitor. 5. Rash: Resolved. Discontinue gemcitabine as above. 6. Peripheral edema: Continue elevation nightly.  Patient expressed understanding and was in agreement with this plan. He also understands that He can call clinic at any time with any questions, concerns, or complaints.   Cancer Staging Pancreatic cancer [facility]) Staging form: Exocrine Pancreas, AJCC 8th Edition - Clinical stage from 09/25/2016: Stage IV (cTX, cNX, pM1) - Signed by Lloyd Huger, MD on 09/25/2016   Lloyd Huger, MD    02/22/2017 10:35 PM

## 2017-02-23 ENCOUNTER — Telehealth: Payer: Self-pay | Admitting: Oncology

## 2017-02-23 ENCOUNTER — Inpatient Hospital Stay: Payer: BLUE CROSS/BLUE SHIELD

## 2017-02-23 ENCOUNTER — Inpatient Hospital Stay: Payer: BLUE CROSS/BLUE SHIELD | Admitting: Oncology

## 2017-02-23 NOTE — Telephone Encounter (Signed)
Left message for callbacl. Pt missed lab\MD\tx appt.

## 2017-03-08 ENCOUNTER — Ambulatory Visit: Admission: RE | Admit: 2017-03-08 | Payer: BLUE CROSS/BLUE SHIELD | Source: Ambulatory Visit

## 2017-03-08 ENCOUNTER — Inpatient Hospital Stay: Payer: BLUE CROSS/BLUE SHIELD

## 2017-03-08 ENCOUNTER — Inpatient Hospital Stay: Payer: BLUE CROSS/BLUE SHIELD | Attending: Oncology | Admitting: Oncology

## 2017-03-08 ENCOUNTER — Other Ambulatory Visit: Payer: Self-pay

## 2017-03-08 ENCOUNTER — Encounter: Payer: Self-pay | Admitting: Oncology

## 2017-03-08 VITALS — BP 153/64 | HR 94 | Temp 96.0°F | Resp 18 | Wt 121.1 lb

## 2017-03-08 DIAGNOSIS — R531 Weakness: Secondary | ICD-10-CM | POA: Diagnosis not present

## 2017-03-08 DIAGNOSIS — M7989 Other specified soft tissue disorders: Secondary | ICD-10-CM

## 2017-03-08 DIAGNOSIS — C787 Secondary malignant neoplasm of liver and intrahepatic bile duct: Secondary | ICD-10-CM | POA: Insufficient documentation

## 2017-03-08 DIAGNOSIS — I4891 Unspecified atrial fibrillation: Secondary | ICD-10-CM | POA: Diagnosis not present

## 2017-03-08 DIAGNOSIS — E871 Hypo-osmolality and hyponatremia: Secondary | ICD-10-CM | POA: Diagnosis not present

## 2017-03-08 DIAGNOSIS — G893 Neoplasm related pain (acute) (chronic): Secondary | ICD-10-CM | POA: Diagnosis not present

## 2017-03-08 DIAGNOSIS — M1990 Unspecified osteoarthritis, unspecified site: Secondary | ICD-10-CM | POA: Insufficient documentation

## 2017-03-08 DIAGNOSIS — F17210 Nicotine dependence, cigarettes, uncomplicated: Secondary | ICD-10-CM | POA: Diagnosis not present

## 2017-03-08 DIAGNOSIS — C259 Malignant neoplasm of pancreas, unspecified: Secondary | ICD-10-CM

## 2017-03-08 DIAGNOSIS — R5381 Other malaise: Secondary | ICD-10-CM | POA: Diagnosis not present

## 2017-03-08 DIAGNOSIS — Z5111 Encounter for antineoplastic chemotherapy: Secondary | ICD-10-CM | POA: Diagnosis not present

## 2017-03-08 DIAGNOSIS — R5383 Other fatigue: Secondary | ICD-10-CM | POA: Diagnosis not present

## 2017-03-08 DIAGNOSIS — C250 Malignant neoplasm of head of pancreas: Secondary | ICD-10-CM | POA: Insufficient documentation

## 2017-03-08 DIAGNOSIS — J449 Chronic obstructive pulmonary disease, unspecified: Secondary | ICD-10-CM | POA: Diagnosis not present

## 2017-03-08 DIAGNOSIS — Z79899 Other long term (current) drug therapy: Secondary | ICD-10-CM

## 2017-03-08 DIAGNOSIS — R600 Localized edema: Secondary | ICD-10-CM

## 2017-03-08 LAB — COMPREHENSIVE METABOLIC PANEL
ALBUMIN: 3.9 g/dL (ref 3.5–5.0)
ALT: 25 U/L (ref 17–63)
AST: 32 U/L (ref 15–41)
Alkaline Phosphatase: 255 U/L — ABNORMAL HIGH (ref 38–126)
Anion gap: 8 (ref 5–15)
BILIRUBIN TOTAL: 0.7 mg/dL (ref 0.3–1.2)
BUN: 7 mg/dL (ref 6–20)
CHLORIDE: 93 mmol/L — AB (ref 101–111)
CO2: 29 mmol/L (ref 22–32)
CREATININE: 0.57 mg/dL — AB (ref 0.61–1.24)
Calcium: 9.2 mg/dL (ref 8.9–10.3)
GFR calc Af Amer: >60 mL/min (ref >60)
GLUCOSE: 107 mg/dL — AB (ref 65–99)
POTASSIUM: 5.2 mmol/L — AB (ref 3.5–5.1)
Sodium: 130 mmol/L — ABNORMAL LOW (ref 135–145)
Total Protein: 6.9 g/dL (ref 6.5–8.1)

## 2017-03-08 LAB — CBC WITH DIFFERENTIAL/PLATELET
Basophils Absolute: 0.1 10*3/uL (ref 0–0.1)
Basophils Relative: 1 %
EOS ABS: 0.5 10*3/uL (ref 0–0.7)
EOS PCT: 6 %
HCT: 47.8 % (ref 40.0–52.0)
HEMOGLOBIN: 16.3 g/dL (ref 13.0–18.0)
LYMPHS ABS: 1.3 10*3/uL (ref 1.0–3.6)
Lymphocytes Relative: 16 %
MCH: 32.6 pg (ref 26.0–34.0)
MCHC: 34.1 g/dL (ref 32.0–36.0)
MCV: 95.8 fL (ref 80.0–100.0)
MONO ABS: 0.8 10*3/uL (ref 0.2–1.0)
MONOS PCT: 10 %
Neutro Abs: 5.4 10*3/uL (ref 1.4–6.5)
Neutrophils Relative %: 67 %
PLATELETS: 284 10*3/uL (ref 150–440)
RBC: 4.99 MIL/uL (ref 4.40–5.90)
RDW: 17.1 % — AB (ref 11.5–14.5)
WBC: 8.2 10*3/uL (ref 3.8–10.6)

## 2017-03-08 MED ORDER — SODIUM CHLORIDE 0.9 % IV SOLN
Freq: Once | INTRAVENOUS | Status: AC
  Administered 2017-03-08: 12:00:00 via INTRAVENOUS
  Filled 2017-03-08: qty 1000

## 2017-03-08 MED ORDER — PROCHLORPERAZINE MALEATE 10 MG PO TABS
10.0000 mg | ORAL_TABLET | Freq: Once | ORAL | Status: AC
  Administered 2017-03-08: 10 mg via ORAL
  Filled 2017-03-08: qty 1

## 2017-03-08 MED ORDER — PACLITAXEL PROTEIN-BOUND CHEMO INJECTION 100 MG
200.0000 mg | Freq: Once | INTRAVENOUS | Status: AC
  Administered 2017-03-08: 200 mg via INTRAVENOUS
  Filled 2017-03-08: qty 40

## 2017-03-08 NOTE — Progress Notes (Signed)
[place]  [phone] Fax:([phone]  ID: Benjamin Diaz. OB: 17-Sep-1960  MR#: [id number]  XHB#:[id number]  Patient Care Team: Patient, [name] as PCP - General (General Practice) [name], MD as Consulting Physician (Oncology) [name], MD as Consulting Physician (Gastroenterology)  CHIEF COMPLAINT: Stage IV pancreatic cancer  INTERVAL HISTORY: Patient returns to clinic today for further evaluation and consideration of cycle 6, day 15 of single agent Abraxane. He missed several appointments in the interim secondary to diarrhea, but this is now resolved and he is back to his baseline.  He continues to have significant edema in his right lower extremity which is worse in the evening.  He has no neurologic complaints. He denies any fevers. His appetite has improved and is weight is stable. He has no chest pain or shortness of breath. He denies any nausea, vomiting, constipation, or diarrhea. He has no urinary complaints. Patient offers no further specific complaints today.  REVIEW OF SYSTEMS:   Review of Systems  Constitutional: Positive for malaise/fatigue. Negative for fever and weight loss.  Respiratory: Negative.  Negative for cough and shortness of breath.   Cardiovascular: Positive for leg swelling. Negative for chest pain.  Gastrointestinal: Negative.  Negative for abdominal pain, nausea and vomiting.  Genitourinary: Negative.   Musculoskeletal: Positive for joint pain.  Skin: Negative.  Negative for itching and rash.  Neurological: Positive for weakness. Negative for sensory change.  Psychiatric/Behavioral: Negative.  The patient is not nervous/anxious.     As per HPI. Otherwise, a complete review of systems is negative.  PAST MEDICAL HISTORY: Past Medical History:  Diagnosis Date  . Arthritis   . Asthma   . Atrial fibrillation ([place])   . Cancer ([place])   . Closed left ankle fracture age 57  . COPD (chronic obstructive  pulmonary disease) ([place])   . Dysrhythmia   . Pneumonia 2011  . Right shoulder injury 09/27/2016  . Sciatica    left leg pinched nerve numb at times about once a year    PAST SURGICAL HISTORY: Past Surgical History:  Procedure Laterality Date  . BILIARY STENT PLACEMENT N/A 09/01/2016   Procedure: BILIARY STENT PLACEMENT;  Surgeon: [name], MD;  [facility];  Service: Endoscopy;  Laterality: N/A;  . DIAGNOSTIC LAPAROSCOPIC LIVER BIOPSY  09/16/2016   Procedure: DIAGNOSTIC LAPAROSCOPIC LIVER BIOPSY;  Surgeon: [name], MD;  Location: [place];  Service: General;;  . ERCP N/A 07/09/2016   Procedure: ENDOSCOPIC RETROGRADE CHOLANGIOPANCREATOGRAPHY (ERCP);  Surgeon: [name], MD;  Location[facility];  Service: Endoscopy;  Laterality: N/A;  . ERCP N/A 09/01/2016   Procedure: ENDOSCOPIC RETROGRADE CHOLANGIOPANCREATOGRAPHY (ERCP);  Surgeon: [name], MD;  Location: [facility];  Service: Endoscopy;  Laterality: N/A;  . ESOPHAGOGASTRODUODENOSCOPY (EGD) WITH PROPOFOL N/A 09/01/2016   Procedure: ESOPHAGOGASTRODUODENOSCOPY (EGD) WITH PROPOFOL;  Surgeon: [name], MD;  [facility];  Service: Gastroenterology;  Laterality: N/A;  . EUS N/A 09/01/2016   Procedure: ESOPHAGEAL ENDOSCOPIC ULTRASOUND (EUS) RADIAL;  Surgeon: [name], MD;  [facility];  Service: Endoscopy;  Laterality: N/A;  . FINE NEEDLE ASPIRATION N/A 09/01/2016   Procedure: FINE NEEDLE ASPIRATION (FNA) RADIAL;  Surgeon: [name], MD;  [facility];  Service: Endoscopy;  Laterality: N/A;  . FOREIGN BODY REMOVAL N/A 09/01/2016   Procedure: FOREIGN BODY REMOVAL;  Surgeon: [name], MD;  [facility];  Service: Gastroenterology;  Laterality: N/A;  . KNEE SURGERY Right 2002   arthroscopy  .  SHOULDER SURGERY Right 2002    FAMILY HISTORY: Family History  Problem Relation Age of Onset  . Heart disease Father     ADVANCED DIRECTIVES (Y/N):  N  HEALTH  MAINTENANCE: Social History   Tobacco Use  . Smoking status: Current Every Day Smoker    Packs/day: 0.50    Years: 40.00    Pack years: 20.00    Types: Cigarettes  . Smokeless tobacco: Never Used  Substance Use Topics  . Alcohol use: Yes    Alcohol/week: 4.2 oz    Types: 7 Cans of beer per week    Comment: last drink 5 weeeks ago  april 2018  . Drug use: No     Colonoscopy:  PAP:  Bone density:  Lipid panel:  Allergies  Allergen Reactions  . No Known Allergies     Current Outpatient Medications  Medication Sig Dispense Refill  . albuterol (PROVENTIL HFA;VENTOLIN HFA) 108 (90 Base) MCG/ACT inhaler Inhale 1-2 puffs into the lungs every 6 (six) hours as needed for wheezing or shortness of breath. 1 Inhaler 6  . Ipratropium-Albuterol (COMBIVENT RESPIMAT) 20-100 MCG/ACT AERS respimat Inhale 1 puff into the lungs 4 (four) times daily.     No current facility-administered medications for this visit.     OBJECTIVE: Vitals:   03/08/17 1142  BP: (!) 153/64  Pulse: 94  Resp: 18  Temp: (!) 96 F (35.6 C)     Body mass index is 16.42 kg/m.    ECOG FS:1 - Symptomatic but completely ambulatory  General: Thin, no acute distress. Eyes: Pink conjunctiva, anicteric sclera. Lungs: Clear to auscultation bilaterally. Heart: Regular rate and rhythm. No rubs, murmurs, or gallops. Abdomen: Soft, nontender, nondistended. No organomegaly noted, normoactive bowel sounds. Musculoskeletal: Scant edema in right lower extremity.   Neuro: Alert, answering all questions appropriately. Cranial nerves grossly intact. Skin: Maculopapular rash noted on torso. Psych: Normal affect.   LAB RESULTS:  Lab Results  Component Value Date   NA 130 (L) 03/08/2017   K 5.2 (H) 03/08/2017   CL 93 (L) 03/08/2017   CO2 29 03/08/2017   GLUCOSE 107 (H) 03/08/2017   BUN 7 03/08/2017   CREATININE 0.57 (L) 03/08/2017   CALCIUM 9.2 03/08/2017   PROT 6.9 03/08/2017   ALBUMIN 3.9 03/08/2017   AST 32  03/08/2017   ALT 25 03/08/2017   ALKPHOS 255 (H) 03/08/2017   BILITOT 0.7 03/08/2017   GFRNONAA >60 03/08/2017   GFRAA >60 03/08/2017    Lab Results  Component Value Date   WBC 8.2 03/08/2017   NEUTROABS 5.4 03/08/2017   HGB 16.3 03/08/2017   HCT 47.8 03/08/2017   MCV 95.8 03/08/2017   PLT 284 03/08/2017      STUDIES: No results found.  ASSESSMENT: Stage IV pancreatic cancer  PLAN:   1. Stage IV pancreatic cancer: Imaging, pathology, and Op note reviewed independently confirming stage IV disease with multiple peritoneal implants. CT scan results from January 03, 2017 reviewed independently with significant improvement of patient's disease burden and no evidence of progressive disease. Patient's CA-19-9 is now trending back down. Patient wishes to proceed with palliative chemotherapy. Given patient's rash, gemcitabine was discontinued. Abraxane okay to use now that his bilirubin and liver enzymes are within normal limits.  Proceed with cycle 6, day 1 of Abraxane today. Return to clinic in a week for consideration of cycle 6, day 8.  Plan to reimage at the conclusion of cycle 6. 2. Pain: Intermittent and mild. Patient reports he  is no longer taking oxycodone.  3. Elevated liver enzymes: Resolved. Secondary to malignancy, monitor. 4. Hyponatremia: Decreased, but stable. Monitor. 5. Rash: Resolved. Discontinue gemcitabine as above. 6. Peripheral edema: Continue elevation nightly.  Will get ultrasound of the right lower extremity today to ensure there is no DVT. 7.  Diarrhea: Resolved.  Patient expressed understanding and was in agreement with this plan. He also understands that He can call clinic at any time with any questions, concerns, or complaints.   Cancer Staging Pancreatic cancer [facility]) Staging form: Exocrine Pancreas, AJCC 8th Edition - Clinical stage from 09/25/2016: Stage IV (cTX, cNX, pM1) - Signed by Lloyd Huger, MD on 09/25/2016   Lloyd Huger, MD    03/08/2017 11:59 AM

## 2017-03-08 NOTE — Progress Notes (Signed)
Patient reports continued swelling in feet and legs.

## 2017-03-09 LAB — CANCER ANTIGEN 19-9: CAN 19-9: 74 U/mL — AB (ref 0–35)

## 2017-03-12 NOTE — Progress Notes (Signed)
Benjamin Diaz  [phone] [phone]) [phone]  ID: Benjamin Diaz. OB: 09-09-1960  MR#: [id number]  TKW#:[id number]  Patient Care Team: Patient, [name] as PCP - General (General Practice) Benjamin Napoleon, MD as Consulting Physician (Oncology) Benjamin Irani, MD as Consulting Physician (Gastroenterology)  CHIEF COMPLAINT: Stage IV pancreatic cancer  INTERVAL HISTORY: Patient returns to clinic today for further evaluation and consideration of cycle 6, day 8 of single agent Abraxane. He feels well and is asymptomatic.  He continues to have significant edema in his right lower extremity which is worse in the evening.  He has no neurologic complaints. He denies any fevers. His appetite has improved and his weight is stable. He has no chest pain or shortness of breath. He denies any nausea, vomiting, constipation, or diarrhea. He has no urinary complaints. Patient offers no further specific complaints today.  REVIEW OF SYSTEMS:   Review of Systems  Constitutional: Positive for malaise/fatigue. Negative for fever and weight loss.  Respiratory: Negative.  Negative for cough and shortness of breath.   Cardiovascular: Positive for leg swelling. Negative for chest pain.  Gastrointestinal: Negative.  Negative for abdominal pain, nausea and vomiting.  Genitourinary: Negative.   Musculoskeletal: Positive for joint pain.  Skin: Negative.  Negative for itching and rash.  Neurological: Positive for weakness. Negative for sensory change.  Psychiatric/Behavioral: Negative.  The patient is not nervous/anxious.     As per HPI. Otherwise, a complete review of systems is negative.  PAST MEDICAL HISTORY: Past Medical History:  Diagnosis Date  . Arthritis   . Asthma   . Atrial fibrillation ([place])   . Cancer ([place])   . Closed left ankle fracture age 49  . COPD (chronic obstructive pulmonary disease) ([place])   . Dysrhythmia   . Pneumonia 2011  . Right shoulder injury  09/27/2016  . Sciatica    left leg pinched nerve numb at times about once a year    PAST SURGICAL HISTORY: Past Surgical History:  Procedure Laterality Date  . BILIARY STENT PLACEMENT N/A 09/01/2016   Procedure: BILIARY STENT PLACEMENT;  Surgeon: [name], MD;  Location: [facility];  Service: Endoscopy;  Laterality: N/A;  . DIAGNOSTIC LAPAROSCOPIC LIVER BIOPSY  09/16/2016   Procedure: DIAGNOSTIC LAPAROSCOPIC LIVER BIOPSY;  Surgeon: [name], MD;  Location: [place];  Service: General;;  . ERCP N/A 07/09/2016   Procedure: ENDOSCOPIC RETROGRADE CHOLANGIOPANCREATOGRAPHY (ERCP);  Surgeon: Benjamin Irani, MD;  Location: [facility];  Service: Endoscopy;  Laterality: N/A;  . ERCP N/A 09/01/2016   Procedure: ENDOSCOPIC RETROGRADE CHOLANGIOPANCREATOGRAPHY (ERCP);  Surgeon: [name], MD;  Location: [facility];  Service: Endoscopy;  Laterality: N/A;  . ESOPHAGOGASTRODUODENOSCOPY (EGD) WITH PROPOFOL N/A 09/01/2016   Procedure: ESOPHAGOGASTRODUODENOSCOPY (EGD) WITH PROPOFOL;  Surgeon: [name], MD;  [facility];  Service: Gastroenterology;  Laterality: N/A;  . EUS N/A 09/01/2016   Procedure: ESOPHAGEAL ENDOSCOPIC ULTRASOUND (EUS) RADIAL;  Surgeon: [name], MD;  [facility];  Service: Endoscopy;  Laterality: N/A;  . FINE NEEDLE ASPIRATION N/A 09/01/2016   Procedure: FINE NEEDLE ASPIRATION (FNA) RADIAL;  Surgeon: [name], MD;  [facility];  Service: Endoscopy;  Laterality: N/A;  . FOREIGN BODY REMOVAL N/A 09/01/2016   Procedure: FOREIGN BODY REMOVAL;  Surgeon: [name], MD;  [facility];  Service: Gastroenterology;  Laterality: N/A;  . KNEE SURGERY Right 2002   arthroscopy  . SHOULDER SURGERY Right 2002    FAMILY HISTORY: Family History  Problem Relation Age of  Onset  . Heart disease Father     ADVANCED DIRECTIVES (Y/N):  N  HEALTH MAINTENANCE: Social History   Tobacco Use  . Smoking status: Current Every Day Smoker     Packs/day: 0.50    Years: 40.00    Pack years: 20.00    Types: Cigarettes  . Smokeless tobacco: Never Used  Substance Use Topics  . Alcohol use: Yes    Alcohol/week: 4.2 oz    Types: 7 Cans of beer per week    Comment: last drink 5 weeeks ago  april 2018  . Drug use: No     Colonoscopy:  PAP:  Bone density:  Lipid panel:  Allergies  Allergen Reactions  . No Known Allergies     Current Outpatient Medications  Medication Sig Dispense Refill  . albuterol (PROVENTIL HFA;VENTOLIN HFA) 108 (90 Base) MCG/ACT inhaler Inhale 1-2 puffs into the lungs every 6 (six) hours as needed for wheezing or shortness of breath. 1 Inhaler 6  . Ipratropium-Albuterol (COMBIVENT RESPIMAT) 20-100 MCG/ACT AERS respimat Inhale 1 puff into the lungs 4 (four) times daily.     No current facility-administered medications for this visit.     OBJECTIVE: Vitals:   03/15/17 1000  BP: 126/81  Pulse: 80  Temp: (!) 96.3 F (35.7 C)     Body mass index is 16.78 kg/m.    ECOG FS:1 - Symptomatic but completely ambulatory  General: Thin, no acute distress. Eyes: Pink conjunctiva, anicteric sclera. Lungs: Clear to auscultation bilaterally. Heart: Regular rate and rhythm. No rubs, murmurs, or gallops. Abdomen: Soft, nontender, nondistended. No organomegaly noted, normoactive bowel sounds. Musculoskeletal: Scant edema in right lower extremity.   Neuro: Alert, answering all questions appropriately. Cranial nerves grossly intact. Skin: Maculopapular rash noted on torso. Psych: Normal affect.   LAB RESULTS:  Lab Results  Component Value Date   NA 131 (L) 03/15/2017   K 4.5 03/15/2017   CL 94 (L) 03/15/2017   CO2 31 03/15/2017   GLUCOSE 114 (H) 03/15/2017   BUN 6 03/15/2017   CREATININE 0.47 (L) 03/15/2017   CALCIUM 8.9 03/15/2017   PROT 6.6 03/15/2017   ALBUMIN 3.7 03/15/2017   AST 53 (H) 03/15/2017   ALT 44 03/15/2017   ALKPHOS 269 (H) 03/15/2017   BILITOT 0.8 03/15/2017   GFRNONAA >60  03/15/2017   GFRAA >60 03/15/2017    Lab Results  Component Value Date   WBC 5.6 03/15/2017   NEUTROABS 2.7 03/15/2017   HGB 14.8 03/15/2017   HCT 44.1 03/15/2017   MCV 96.1 03/15/2017   PLT 308 03/15/2017      STUDIES: No results found.  ASSESSMENT: Stage IV pancreatic cancer  PLAN:   1. Stage IV pancreatic cancer: Imaging, pathology, and Op note reviewed independently confirming stage IV disease with multiple peritoneal implants. CT scan results from January 03, 2017 reviewed independently with significant improvement of patient's disease burden and no evidence of progressive disease. Patient's CA-19-9 is slowly trending back up. Patient wishes to proceed with palliative chemotherapy. Given patient's rash, gemcitabine was discontinued. Abraxane okay to use now that his bilirubin and liver enzymes are within normal limits.  Proceed with cycle 6, day 8 of Abraxane today.  Given his increasing tumor marker, not proceed with cycle 6, day 15 next week patient will return to clinic in several weeks with restaging PET scan.   2. Pain: Intermittent and mild. Patient reports he is no longer taking oxycodone.  3. Elevated liver enzymes: Resolved. Secondary to malignancy,  monitor. 4. Hyponatremia: Decreased, but stable. Monitor. 5. Rash: Resolved. Discontinue gemcitabine as above. 6. Peripheral edema: Continue elevation nightly.  Lower extremity ultrasound did not reveal DVT.   7.  Diarrhea: Resolved.  Patient expressed understanding and was in agreement with this plan. He also understands that He can call clinic at any time with any questions, concerns, or complaints.   Cancer Staging Pancreatic cancer [facility]) Staging form: Exocrine Pancreas, AJCC 8th Edition - Clinical stage from 09/25/2016: Stage IV (cTX, cNX, pM1) - Signed by Lloyd Huger, MD on 09/25/2016   Lloyd Huger, MD   03/18/2017 7:14 AM

## 2017-03-14 ENCOUNTER — Other Ambulatory Visit: Payer: Self-pay | Admitting: Oncology

## 2017-03-15 ENCOUNTER — Encounter: Payer: Self-pay | Admitting: Oncology

## 2017-03-15 ENCOUNTER — Inpatient Hospital Stay (HOSPITAL_BASED_OUTPATIENT_CLINIC_OR_DEPARTMENT_OTHER): Payer: BLUE CROSS/BLUE SHIELD | Admitting: Oncology

## 2017-03-15 ENCOUNTER — Inpatient Hospital Stay: Payer: BLUE CROSS/BLUE SHIELD

## 2017-03-15 ENCOUNTER — Other Ambulatory Visit: Payer: Self-pay

## 2017-03-15 VITALS — BP 126/81 | HR 80 | Temp 96.3°F | Wt 123.7 lb

## 2017-03-15 DIAGNOSIS — C787 Secondary malignant neoplasm of liver and intrahepatic bile duct: Secondary | ICD-10-CM

## 2017-03-15 DIAGNOSIS — C250 Malignant neoplasm of head of pancreas: Secondary | ICD-10-CM

## 2017-03-15 DIAGNOSIS — M1990 Unspecified osteoarthritis, unspecified site: Secondary | ICD-10-CM

## 2017-03-15 DIAGNOSIS — E871 Hypo-osmolality and hyponatremia: Secondary | ICD-10-CM

## 2017-03-15 DIAGNOSIS — R600 Localized edema: Secondary | ICD-10-CM | POA: Diagnosis not present

## 2017-03-15 DIAGNOSIS — R5381 Other malaise: Secondary | ICD-10-CM | POA: Diagnosis not present

## 2017-03-15 DIAGNOSIS — F17210 Nicotine dependence, cigarettes, uncomplicated: Secondary | ICD-10-CM | POA: Diagnosis not present

## 2017-03-15 DIAGNOSIS — R531 Weakness: Secondary | ICD-10-CM | POA: Diagnosis not present

## 2017-03-15 DIAGNOSIS — C259 Malignant neoplasm of pancreas, unspecified: Secondary | ICD-10-CM

## 2017-03-15 DIAGNOSIS — R5383 Other fatigue: Secondary | ICD-10-CM

## 2017-03-15 DIAGNOSIS — Z79899 Other long term (current) drug therapy: Secondary | ICD-10-CM | POA: Diagnosis not present

## 2017-03-15 DIAGNOSIS — I4891 Unspecified atrial fibrillation: Secondary | ICD-10-CM

## 2017-03-15 DIAGNOSIS — J449 Chronic obstructive pulmonary disease, unspecified: Secondary | ICD-10-CM

## 2017-03-15 LAB — CBC WITH DIFFERENTIAL/PLATELET
Basophils Absolute: 0.1 10*3/uL (ref 0–0.1)
Basophils Relative: 2 %
Eosinophils Absolute: 0.8 10*3/uL — ABNORMAL HIGH (ref 0–0.7)
Eosinophils Relative: 14 %
HEMATOCRIT: 44.1 % (ref 40.0–52.0)
Hemoglobin: 14.8 g/dL (ref 13.0–18.0)
LYMPHS ABS: 1.7 10*3/uL (ref 1.0–3.6)
LYMPHS PCT: 30 %
MCH: 32.3 pg (ref 26.0–34.0)
MCHC: 33.7 g/dL (ref 32.0–36.0)
MCV: 96.1 fL (ref 80.0–100.0)
MONO ABS: 0.3 10*3/uL (ref 0.2–1.0)
MONOS PCT: 6 %
NEUTROS ABS: 2.7 10*3/uL (ref 1.4–6.5)
Neutrophils Relative %: 48 %
Platelets: 308 10*3/uL (ref 150–440)
RBC: 4.59 MIL/uL (ref 4.40–5.90)
RDW: 16 % — AB (ref 11.5–14.5)
WBC: 5.6 10*3/uL (ref 3.8–10.6)

## 2017-03-15 LAB — COMPREHENSIVE METABOLIC PANEL
ALT: 44 U/L (ref 17–63)
ANION GAP: 6 (ref 5–15)
AST: 53 U/L — ABNORMAL HIGH (ref 15–41)
Albumin: 3.7 g/dL (ref 3.5–5.0)
Alkaline Phosphatase: 269 U/L — ABNORMAL HIGH (ref 38–126)
BILIRUBIN TOTAL: 0.8 mg/dL (ref 0.3–1.2)
BUN: 6 mg/dL (ref 6–20)
CALCIUM: 8.9 mg/dL (ref 8.9–10.3)
CO2: 31 mmol/L (ref 22–32)
Chloride: 94 mmol/L — ABNORMAL LOW (ref 101–111)
Creatinine, Ser: 0.47 mg/dL — ABNORMAL LOW (ref 0.61–1.24)
GFR calc Af Amer: >60 mL/min (ref >60)
Glucose, Bld: 114 mg/dL — ABNORMAL HIGH (ref 65–99)
POTASSIUM: 4.5 mmol/L (ref 3.5–5.1)
Sodium: 131 mmol/L — ABNORMAL LOW (ref 135–145)
TOTAL PROTEIN: 6.6 g/dL (ref 6.5–8.1)

## 2017-03-15 MED ORDER — PROCHLORPERAZINE MALEATE 10 MG PO TABS
10.0000 mg | ORAL_TABLET | Freq: Once | ORAL | Status: AC
  Administered 2017-03-15: 10 mg via ORAL
  Filled 2017-03-15: qty 1

## 2017-03-15 MED ORDER — PACLITAXEL PROTEIN-BOUND CHEMO INJECTION 100 MG
200.0000 mg | Freq: Once | INTRAVENOUS | Status: AC
  Administered 2017-03-15: 200 mg via INTRAVENOUS
  Filled 2017-03-15: qty 40

## 2017-03-15 MED ORDER — SODIUM CHLORIDE 0.9 % IV SOLN
Freq: Once | INTRAVENOUS | Status: AC
  Administered 2017-03-15: 11:00:00 via INTRAVENOUS
  Filled 2017-03-15: qty 1000

## 2017-03-16 LAB — CANCER ANTIGEN 19-9: CAN 19-9: 81 U/mL — AB (ref 0–35)

## 2017-04-10 NOTE — Progress Notes (Signed)
[place]  Telephone:([phone] [phone]  ID: Benjamin Diaz. OB: March 27, 1961  MR#: [id number]  AQT#:[id number]  Patient Care Team: Patient, [name] as PCP - General (General Practice) [name], MD as Consulting Physician (Oncology) [name], MD as Consulting Physician (Gastroenterology)  CHIEF COMPLAINT: Stage IV pancreatic cancer  INTERVAL HISTORY: Patient returns to clinic today for further evaluation, discussion of his imaging results, and consideration of cycle 7, day 1 of single agent Abraxane. He has intermittent abdominal cramping, but otherwise feels well.  He continues to have significant edema in his right lower extremity which is worse in the evening.  He has no neurologic complaints. He denies any recent fevers or illnesses. His appetite has improved and his weight is stable. He has no chest pain or shortness of breath. He denies any nausea, vomiting, constipation, or diarrhea. He has no urinary complaints. Patient offers no further specific complaints today.  REVIEW OF SYSTEMS:   Review of Systems  Constitutional: Positive for malaise/fatigue. Negative for fever and weight loss.  Respiratory: Negative.  Negative for cough and shortness of breath.   Cardiovascular: Positive for leg swelling. Negative for chest pain.  Gastrointestinal: Negative.  Negative for abdominal pain, nausea and vomiting.  Genitourinary: Negative.   Musculoskeletal: Positive for joint pain.  Skin: Negative.  Negative for itching and rash.  Neurological: Positive for weakness. Negative for sensory change.  Psychiatric/Behavioral: Negative.  The patient is not nervous/anxious.     As per HPI. Otherwise, a complete review of systems is negative.  PAST MEDICAL HISTORY: Past Medical History:  Diagnosis Date  . Arthritis   . Asthma   . Atrial fibrillation ([place])   . Cancer ([place])   . Closed left ankle fracture age 29  . COPD (chronic obstructive  pulmonary disease) ([place])   . Dysrhythmia   . Pneumonia 2011  . Right shoulder injury 09/27/2016  . Sciatica    left leg pinched nerve numb at times about once a year    PAST SURGICAL HISTORY: Past Surgical History:  Procedure Laterality Date  . BILIARY STENT PLACEMENT N/A 09/01/2016   Procedure: BILIARY STENT PLACEMENT;  Surgeon: [name], MD;  [facility];  Service: Endoscopy;  Laterality: N/A;  . DIAGNOSTIC LAPAROSCOPIC LIVER BIOPSY  09/16/2016   Procedure: DIAGNOSTIC LAPAROSCOPIC LIVER BIOPSY;  Surgeon: [name], MD;  Location: [place];  Service: General;;  . ERCP N/A 07/09/2016   Procedure: ENDOSCOPIC RETROGRADE CHOLANGIOPANCREATOGRAPHY (ERCP);  Surgeon: [name], MD;  Location: [facility];  Service: Endoscopy;  Laterality: N/A;  . ERCP N/A 09/01/2016   Procedure: ENDOSCOPIC RETROGRADE CHOLANGIOPANCREATOGRAPHY (ERCP);  Surgeon: [name], MD;  Location: [facility];  Service: Endoscopy;  Laterality: N/A;  . ESOPHAGOGASTRODUODENOSCOPY (EGD) WITH PROPOFOL N/A 09/01/2016   Procedure: ESOPHAGOGASTRODUODENOSCOPY (EGD) WITH PROPOFOL;  Surgeon: [name], MD;  [facility];  Service: Gastroenterology;  Laterality: N/A;  . EUS N/A 09/01/2016   Procedure: ESOPHAGEAL ENDOSCOPIC ULTRASOUND (EUS) RADIAL;  Surgeon: [name], MD;  [facility];  Service: Endoscopy;  Laterality: N/A;  . FINE NEEDLE ASPIRATION N/A 09/01/2016   Procedure: FINE NEEDLE ASPIRATION (FNA) RADIAL;  Surgeon: [name], MD;  [facility];  Service: Endoscopy;  Laterality: N/A;  . FOREIGN BODY REMOVAL N/A 09/01/2016   Procedure: FOREIGN BODY REMOVAL;  Surgeon: [name], MD;  [facility];  Service: Gastroenterology;  Laterality: N/A;  . KNEE SURGERY Right 2002   arthroscopy  . SHOULDER SURGERY Right 2002  FAMILY HISTORY: Family History  Problem Relation Age of Onset  . Heart disease Father     ADVANCED DIRECTIVES (Y/N):  N  HEALTH  MAINTENANCE: Social History   Tobacco Use  . Smoking status: Current Every Day Smoker    Packs/day: 0.50    Years: 40.00    Pack years: 20.00    Types: Cigarettes  . Smokeless tobacco: Never Used  Substance Use Topics  . Alcohol use: Yes    Alcohol/week: 4.2 oz    Types: 7 Cans of beer per week    Comment: last drink 5 weeeks ago  april 2018  . Drug use: No     Colonoscopy:  PAP:  Bone density:  Lipid panel:  Allergies  Allergen Reactions  . No Known Allergies     Current Outpatient Medications  Medication Sig Dispense Refill  . albuterol (PROVENTIL HFA;VENTOLIN HFA) 108 (90 Base) MCG/ACT inhaler Inhale 1-2 puffs into the lungs every 6 (six) hours as needed for wheezing or shortness of breath. 1 Inhaler 6  . Ipratropium-Albuterol (COMBIVENT RESPIMAT) 20-100 MCG/ACT AERS respimat Inhale 1 puff into the lungs 4 (four) times daily.     No current facility-administered medications for this visit.     OBJECTIVE: Vitals:   04/14/17 1011  BP: 129/77  Pulse: 81  Resp: 20  Temp: (!) 96.8 F (36 C)     Body mass index is 16.9 kg/m.    ECOG FS:1 - Symptomatic but completely ambulatory  General: Thin, no acute distress. Eyes: Pink conjunctiva, anicteric sclera. Lungs: Clear to auscultation bilaterally. Heart: Regular rate and rhythm. No rubs, murmurs, or gallops. Abdomen: Soft, nontender, nondistended. No organomegaly noted, normoactive bowel sounds. Musculoskeletal: Scant edema in right lower extremity.   Neuro: Alert, answering all questions appropriately. Cranial nerves grossly intact. Skin: Maculopapular rash noted on torso. Psych: Normal affect.   LAB RESULTS:  Lab Results  Component Value Date   NA 127 (L) 04/14/2017   K 4.8 04/14/2017   CL 91 (L) 04/14/2017   CO2 28 04/14/2017   GLUCOSE 101 (H) 04/14/2017   BUN 10 04/14/2017   CREATININE 0.52 (L) 04/14/2017   CALCIUM 9.0 04/14/2017   PROT 6.7 04/14/2017   ALBUMIN 3.7 04/14/2017   AST 41 04/14/2017    ALT 34 04/14/2017   ALKPHOS 247 (H) 04/14/2017   BILITOT 0.7 04/14/2017   GFRNONAA >60 04/14/2017   GFRAA >60 04/14/2017    Lab Results  Component Value Date   WBC 9.8 04/14/2017   NEUTROABS 6.1 04/14/2017   HGB 15.9 04/14/2017   HCT 46.9 04/14/2017   MCV 95.6 04/14/2017   PLT 286 04/14/2017      STUDIES: Nm Pet Image Restag (ps) Skull Base To Thigh  Result Date: 04/12/2017 CLINICAL DATA:  Subsequent treatment strategy for pancreatic carcinoma. EXAM: NUCLEAR MEDICINE PET SKULL BASE TO THIGH TECHNIQUE: 12.9 mCi F-18 FDG was injected intravenously. Full-ring PET imaging was performed from the skull base to thigh after the radiotracer. CT data was obtained and used for attenuation correction and anatomic localization. FASTING BLOOD GLUCOSE:  Value: 99 mg/dl COMPARISON:  PET-CT 09/27/2016, CT 01/03/2017 FINDINGS: NECK No hypermetabolic lymph nodes in the neck. CHEST No hypermetabolic mediastinal or hilar nodes. No suspicious pulmonary nodules on the CT scan. Paraseptal emphysema at the apices. ABDOMEN/PELVIS There remains intense metabolic activity through the head of the pancreas along the common bile stent S with UV max 5.0 decreased from SUV max 9.6. The size of the pancreatic lesions difficult  to ascertain on noncontrast Clear reduction in metabolic activity hepatic metastasis. No clear hypermetabolic activity above background. Extensive pneumobilia noted. No duct dilatation There is a rim metabolic activity associated gallbladder SUV max 2.9. This much improved from hypermetabolic mass along lateral margin of gallbladder SUV 16.6. No clear hypermetabolic adenopathy.  No new metastatic lesion SKELETON No focal hypermetabolic activity to suggest skeletal metastasis. IMPRESSION: 1. Marked positive response to therapy with near complete resolution metabolic activity within hepatic metastasis. 2. Marked in size and metabolic activity of lesion the lateral margin the of the gallbladder. 3. Reduction  in metabolic activity in the pancreatic head mass lesion. Significant activity does remain but significantly reduced. 4. No evidence of new or progressive carcinoma. Electronically Signed   By: Suzy Bouchard M.D.   On: 04/12/2017 15:02    ASSESSMENT: Stage IV pancreatic cancer  PLAN:   1. Stage IV pancreatic cancer: Imaging, pathology, and Op note reviewed independently confirming stage IV disease with multiple peritoneal implants.  PET scan results from April 12, 2017 reviewed independently and reported as above with near complete resolution of hepatic metastasis and reduction of metabolic activity in the pancreatic head.  Despite this, patient CA-19-9 continues to trend up and is now 107.  Patient wishes to proceed with palliative chemotherapy. Given patient's rash, gemcitabine was discontinued. Abraxane okay to use now that his bilirubin and liver enzymes are within normal limits.  Proceed with cycle 7, day 1 of Abraxane today.  Return to clinic in 1 week for consideration of cycle 7, day 8. 2. Pain: Intermittent and mild. Patient reports he is no longer taking oxycodone.  3. Elevated liver enzymes: Resolved. Secondary to malignancy, monitor. 4. Hyponatremia: Decreased, but stable. Monitor. 5. Rash: Resolved. Discontinue gemcitabine as above. 6. Peripheral edema: Continue elevation nightly.  Lower extremity ultrasound did not reveal DVT.   7.  Diarrhea: Resolved.  Patient expressed understanding and was in agreement with this plan. He also understands that He can call clinic at any time with any questions, concerns, or complaints.   Cancer Staging Pancreatic cancer [facility]) Staging form: Exocrine Pancreas, AJCC 8th Edition - Clinical stage from 09/25/2016: Stage IV (cTX, cNX, pM1) - Signed by Lloyd Huger, MD on 09/25/2016   Lloyd Huger, MD   04/17/2017 11:00 AM

## 2017-04-12 ENCOUNTER — Ambulatory Visit
Admission: RE | Admit: 2017-04-12 | Discharge: 2017-04-12 | Disposition: A | Discharge status: Home / Self Care | Payer: BLUE CROSS/BLUE SHIELD | Source: Ambulatory Visit | Attending: Oncology | Admitting: Oncology

## 2017-04-12 DIAGNOSIS — C787 Secondary malignant neoplasm of liver and intrahepatic bile duct: Secondary | ICD-10-CM | POA: Insufficient documentation

## 2017-04-12 DIAGNOSIS — C259 Malignant neoplasm of pancreas, unspecified: Secondary | ICD-10-CM | POA: Diagnosis present

## 2017-04-12 LAB — GLUCOSE, CAPILLARY: Glucose-Capillary: 99 mg/dL (ref 65–99)

## 2017-04-12 MED ORDER — FLUDEOXYGLUCOSE F - 18 (FDG) INJECTION
12.0000 | Freq: Once | INTRAVENOUS | Status: AC | PRN
  Administered 2017-04-12: 12.9 via INTRAVENOUS

## 2017-04-14 ENCOUNTER — Inpatient Hospital Stay: Payer: BLUE CROSS/BLUE SHIELD | Attending: Oncology

## 2017-04-14 ENCOUNTER — Inpatient Hospital Stay (HOSPITAL_BASED_OUTPATIENT_CLINIC_OR_DEPARTMENT_OTHER): Payer: BLUE CROSS/BLUE SHIELD | Admitting: Oncology

## 2017-04-14 ENCOUNTER — Inpatient Hospital Stay: Payer: BLUE CROSS/BLUE SHIELD

## 2017-04-14 VITALS — BP 129/77 | HR 81 | Temp 96.8°F | Resp 20 | Wt 124.6 lb

## 2017-04-14 DIAGNOSIS — C259 Malignant neoplasm of pancreas, unspecified: Secondary | ICD-10-CM

## 2017-04-14 DIAGNOSIS — R5381 Other malaise: Secondary | ICD-10-CM

## 2017-04-14 DIAGNOSIS — J449 Chronic obstructive pulmonary disease, unspecified: Secondary | ICD-10-CM | POA: Diagnosis not present

## 2017-04-14 DIAGNOSIS — R600 Localized edema: Secondary | ICD-10-CM

## 2017-04-14 DIAGNOSIS — F17210 Nicotine dependence, cigarettes, uncomplicated: Secondary | ICD-10-CM | POA: Diagnosis not present

## 2017-04-14 DIAGNOSIS — Z5111 Encounter for antineoplastic chemotherapy: Secondary | ICD-10-CM | POA: Insufficient documentation

## 2017-04-14 DIAGNOSIS — I4891 Unspecified atrial fibrillation: Secondary | ICD-10-CM

## 2017-04-14 DIAGNOSIS — R109 Unspecified abdominal pain: Secondary | ICD-10-CM | POA: Insufficient documentation

## 2017-04-14 DIAGNOSIS — E871 Hypo-osmolality and hyponatremia: Secondary | ICD-10-CM

## 2017-04-14 DIAGNOSIS — R531 Weakness: Secondary | ICD-10-CM | POA: Diagnosis not present

## 2017-04-14 DIAGNOSIS — R5383 Other fatigue: Secondary | ICD-10-CM | POA: Insufficient documentation

## 2017-04-14 DIAGNOSIS — C787 Secondary malignant neoplasm of liver and intrahepatic bile duct: Secondary | ICD-10-CM | POA: Diagnosis present

## 2017-04-14 LAB — COMPREHENSIVE METABOLIC PANEL
ALK PHOS: 247 U/L — AB (ref 38–126)
ALT: 34 U/L (ref 17–63)
AST: 41 U/L (ref 15–41)
Albumin: 3.7 g/dL (ref 3.5–5.0)
Anion gap: 8 (ref 5–15)
BILIRUBIN TOTAL: 0.7 mg/dL (ref 0.3–1.2)
BUN: 10 mg/dL (ref 6–20)
CALCIUM: 9 mg/dL (ref 8.9–10.3)
CO2: 28 mmol/L (ref 22–32)
Chloride: 91 mmol/L — ABNORMAL LOW (ref 101–111)
Creatinine, Ser: 0.52 mg/dL — ABNORMAL LOW (ref 0.61–1.24)
Glucose, Bld: 101 mg/dL — ABNORMAL HIGH (ref 65–99)
Potassium: 4.8 mmol/L (ref 3.5–5.1)
Sodium: 127 mmol/L — ABNORMAL LOW (ref 135–145)
TOTAL PROTEIN: 6.7 g/dL (ref 6.5–8.1)

## 2017-04-14 LAB — CBC WITH DIFFERENTIAL/PLATELET
BASOS PCT: 1 %
Basophils Absolute: 0.1 10*3/uL (ref 0–0.1)
EOS ABS: 0.9 10*3/uL — AB (ref 0–0.7)
EOS PCT: 10 %
HCT: 46.9 % (ref 40.0–52.0)
HEMOGLOBIN: 15.9 g/dL (ref 13.0–18.0)
Lymphocytes Relative: 16 %
Lymphs Abs: 1.6 10*3/uL (ref 1.0–3.6)
MCH: 32.4 pg (ref 26.0–34.0)
MCHC: 33.9 g/dL (ref 32.0–36.0)
MCV: 95.6 fL (ref 80.0–100.0)
Monocytes Absolute: 1.1 10*3/uL — ABNORMAL HIGH (ref 0.2–1.0)
Monocytes Relative: 11 %
NEUTROS PCT: 62 %
Neutro Abs: 6.1 10*3/uL (ref 1.4–6.5)
PLATELETS: 286 10*3/uL (ref 150–440)
RBC: 4.9 MIL/uL (ref 4.40–5.90)
RDW: 15.2 % — ABNORMAL HIGH (ref 11.5–14.5)
WBC: 9.8 10*3/uL (ref 3.8–10.6)

## 2017-04-14 MED ORDER — SODIUM CHLORIDE 0.9 % IV SOLN
Freq: Once | INTRAVENOUS | Status: AC
  Administered 2017-04-14: 12:00:00 via INTRAVENOUS
  Filled 2017-04-14: qty 1000

## 2017-04-14 MED ORDER — PROCHLORPERAZINE MALEATE 10 MG PO TABS
10.0000 mg | ORAL_TABLET | Freq: Once | ORAL | Status: AC
Start: 2017-04-14 — End: 2017-04-14
  Administered 2017-04-14: 10 mg via ORAL
  Filled 2017-04-14: qty 1

## 2017-04-14 MED ORDER — PACLITAXEL PROTEIN-BOUND CHEMO INJECTION 100 MG
200.0000 mg | Freq: Once | INTRAVENOUS | Status: AC
  Administered 2017-04-14: 200 mg via INTRAVENOUS
  Filled 2017-04-14: qty 40

## 2017-04-14 NOTE — Progress Notes (Signed)
Right ankle is still painful when walking and pulling sensation in abdomen.

## 2017-04-15 LAB — CANCER ANTIGEN 19-9: CA 19-9: 107 U/mL — ABNORMAL HIGH (ref 0–35)

## 2017-04-18 NOTE — Progress Notes (Signed)
Benjamin Diaz  [phone] [phone]  ID: [name]. OB: September 08, 1960  MR#: [id number]  [id number]#:[id number]  Patient Care Team: Patient, [name] as PCP - General (General Practice) [name], MD as Consulting Physician (Oncology) [name], MD as Consulting Physician (Gastroenterology) [name], MD as Consulting Physician (Gastroenterology)  CHIEF COMPLAINT: Stage IV pancreatic cancer  INTERVAL HISTORY: Patient returns to clinic today for further evaluation and consideration of cycle 7, day 8 of single agent Abraxane. He continues to have intermittent abdominal cramping, but otherwise feels well.  He continues to have edema in his right lower extremity which is worse in the evening.  He has no neurologic complaints. He denies any recent fevers or illnesses. His appetite has improved and his weight is stable. He has no chest pain or shortness of breath. He denies any nausea, vomiting, constipation, or diarrhea. He has no urinary complaints. Patient offers no further specific complaints today.  REVIEW OF SYSTEMS:   Review of Systems  Constitutional: Positive for malaise/fatigue. Negative for fever and weight loss.  Respiratory: Negative.  Negative for cough and shortness of breath.   Cardiovascular: Positive for leg swelling. Negative for chest pain.  Gastrointestinal: Positive for abdominal pain. Negative for diarrhea, nausea and vomiting.  Genitourinary: Negative.   Musculoskeletal: Negative.  Negative for joint pain.  Skin: Negative.  Negative for itching and rash.  Neurological: Positive for weakness. Negative for sensory change.  Psychiatric/Behavioral: Negative.  The patient is not nervous/anxious.     As per HPI. Otherwise, a complete review of systems is negative.  PAST MEDICAL HISTORY: Past Medical History:  Diagnosis Date  . Arthritis   . Asthma   . Atrial fibrillation ([place])   . Cancer ([place])   . Closed left  ankle fracture age 53  . COPD (chronic obstructive pulmonary disease) ([place])   . Dysrhythmia   . Pneumonia 2011  . Right shoulder injury 09/27/2016  . Sciatica    left leg pinched nerve numb at times about once a year    PAST SURGICAL HISTORY: Past Surgical History:  Procedure Laterality Date  . BILIARY STENT PLACEMENT N/A 09/01/2016   Procedure: BILIARY STENT PLACEMENT;  Surgeon: [name], MD;  Location: [facility];  Service: Endoscopy;  Laterality: N/A;  . DIAGNOSTIC LAPAROSCOPIC LIVER BIOPSY  09/16/2016   Procedure: DIAGNOSTIC LAPAROSCOPIC LIVER BIOPSY;  Surgeon: [name], MD;  Location: [place];  Service: General;;  . ERCP N/A 07/09/2016   Procedure: ENDOSCOPIC RETROGRADE CHOLANGIOPANCREATOGRAPHY (ERCP);  Surgeon: [name], MD;  Location: [facility];  Service: Endoscopy;  Laterality: N/A;  . ERCP N/A 09/01/2016   Procedure: ENDOSCOPIC RETROGRADE CHOLANGIOPANCREATOGRAPHY (ERCP);  Surgeon: [name], MD;  Location: [facility];  Service: Endoscopy;  Laterality: N/A;  . ESOPHAGOGASTRODUODENOSCOPY (EGD) WITH PROPOFOL N/A 09/01/2016   Procedure: ESOPHAGOGASTRODUODENOSCOPY (EGD) WITH PROPOFOL;  Surgeon: [name], MD;  [facility];  Service: Gastroenterology;  Laterality: N/A;  . EUS N/A 09/01/2016   Procedure: ESOPHAGEAL ENDOSCOPIC ULTRASOUND (EUS) RADIAL;  Surgeon: [name], MD;  [facility];  Service: Endoscopy;  Laterality: N/A;  . FINE NEEDLE ASPIRATION N/A 09/01/2016   Procedure: FINE NEEDLE ASPIRATION (FNA) RADIAL;  Surgeon: [name], MD;  [facility];  Service: Endoscopy;  Laterality: N/A;  . FOREIGN BODY REMOVAL N/A 09/01/2016   Procedure: FOREIGN BODY REMOVAL;  Surgeon: [name], MD;  [facility];  Service: Gastroenterology;  Laterality: N/A;  . KNEE SURGERY Right 2002   arthroscopy  .  SHOULDER SURGERY Right 2002    FAMILY HISTORY: Family History  Problem Relation Age of Onset  . Heart disease Father      ADVANCED DIRECTIVES (Y/N):  N  HEALTH MAINTENANCE: Social History   Tobacco Use  . Smoking status: Current Every Day Smoker    Packs/day: 0.50    Years: 40.00    Pack years: 20.00    Types: Cigarettes  . Smokeless tobacco: Never Used  Substance Use Topics  . Alcohol use: Yes    Alcohol/week: 4.2 oz    Types: 7 Cans of beer per week    Comment: last drink 5 weeeks ago  april 2018  . Drug use: No     Colonoscopy:  PAP:  Bone density:  Lipid panel:  Allergies  Allergen Reactions  . No Known Allergies     Current Outpatient Medications  Medication Sig Dispense Refill  . albuterol (PROVENTIL HFA;VENTOLIN HFA) 108 (90 Base) MCG/ACT inhaler Inhale 1-2 puffs into the lungs every 6 (six) hours as needed for wheezing or shortness of breath. 1 Inhaler 6  . Ipratropium-Albuterol (COMBIVENT RESPIMAT) 20-100 MCG/ACT AERS respimat Inhale 1 puff into the lungs 4 (four) times daily.     No current facility-administered medications for this visit.     OBJECTIVE: Vitals:   04/21/17 0853  BP: 105/76  Pulse: 98  Resp: 18  Temp: 97.6 F (36.4 C)     Body mass index is 16.72 kg/m.    ECOG FS:1 - Symptomatic but completely ambulatory  General: Thin, no acute distress. Eyes: Pink conjunctiva, anicteric sclera. Lungs: Clear to auscultation bilaterally. Heart: Regular rate and rhythm. No rubs, murmurs, or gallops. Abdomen: Soft, nontender, nondistended. No organomegaly noted, normoactive bowel sounds. Musculoskeletal: Scant edema in right lower extremity.   Neuro: Alert, answering all questions appropriately. Cranial nerves grossly intact. Skin: Maculopapular rash noted on torso. Psych: Normal affect.   LAB RESULTS:  Lab Results  Component Value Date   NA 131 (L) 04/21/2017   K 4.2 04/21/2017   CL 94 (L) 04/21/2017   CO2 30 04/21/2017   GLUCOSE 118 (H) 04/21/2017   BUN 8 04/21/2017   CREATININE 0.50 (L) 04/21/2017   CALCIUM 9.0 04/21/2017   PROT 6.6 04/21/2017    ALBUMIN 3.7 04/21/2017   AST 42 (H) 04/21/2017   ALT 41 04/21/2017   ALKPHOS 265 (H) 04/21/2017   BILITOT 0.6 04/21/2017   GFRNONAA >60 04/21/2017   GFRAA >60 04/21/2017    Lab Results  Component Value Date   WBC 6.2 04/21/2017   NEUTROABS 3.2 04/21/2017   HGB 15.0 04/21/2017   HCT 44.0 04/21/2017   MCV 94.8 04/21/2017   PLT 308 04/21/2017      STUDIES: Nm Pet Image Restag (ps) Skull Base To Thigh  Result Date: 04/12/2017 CLINICAL DATA:  Subsequent treatment strategy for pancreatic carcinoma. EXAM: NUCLEAR MEDICINE PET SKULL BASE TO THIGH TECHNIQUE: 12.9 mCi F-18 FDG was injected intravenously. Full-ring PET imaging was performed from the skull base to thigh after the radiotracer. CT data was obtained and used for attenuation correction and anatomic localization. FASTING BLOOD GLUCOSE:  Value: 99 mg/dl COMPARISON:  PET-CT 09/27/2016, CT 01/03/2017 FINDINGS: NECK No hypermetabolic lymph nodes in the neck. CHEST No hypermetabolic mediastinal or hilar nodes. No suspicious pulmonary nodules on the CT scan. Paraseptal emphysema at the apices. ABDOMEN/PELVIS There remains intense metabolic activity through the head of the pancreas along the common bile stent S with UV max 5.0 decreased from SUV max 9.6.  The size of the pancreatic lesions difficult to ascertain on noncontrast Clear reduction in metabolic activity hepatic metastasis. No clear hypermetabolic activity above background. Extensive pneumobilia noted. No duct dilatation There is a rim metabolic activity associated gallbladder SUV max 2.9. This much improved from hypermetabolic mass along lateral margin of gallbladder SUV 16.6. No clear hypermetabolic adenopathy.  No new metastatic lesion SKELETON No focal hypermetabolic activity to suggest skeletal metastasis. IMPRESSION: 1. Marked positive response to therapy with near complete resolution metabolic activity within hepatic metastasis. 2. Marked in size and metabolic activity of lesion the  lateral margin the of the gallbladder. 3. Reduction in metabolic activity in the pancreatic head mass lesion. Significant activity does remain but significantly reduced. 4. No evidence of new or progressive carcinoma. Electronically Signed   By: Suzy Bouchard M.D.   On: 04/12/2017 15:02    ASSESSMENT: Stage IV pancreatic cancer  PLAN:   1. Stage IV pancreatic cancer: Imaging, pathology, and Op note reviewed independently confirming stage IV disease with multiple peritoneal implants.  PET scan results from April 12, 2017 reviewed independently and reported as above with near complete resolution of hepatic metastasis and reduction of metabolic activity in the pancreatic head.  Despite this, patient CA-19-9 continues to trend up and is now 107.  Today's result is pending.  Patient wishes to proceed with palliative chemotherapy. Given patient's rash, gemcitabine was discontinued. Abraxane okay to use now that his bilirubin and liver enzymes are within normal limits.  Proceed with cycle 7, day 8 of Abraxane today.  Return to clinic in 1 week for consideration of cycle 7, day 15. 2. Pain: Intermittent and mild. Patient reports he is no longer taking oxycodone.  Patient was given a referral back to GI, Dr. [name], for evaluation and consideration of stent replacement. 3. Elevated liver enzymes: Resolved. Secondary to malignancy, monitor. 4. Hyponatremia: Decreased, but stable. Monitor. 5. Rash: Resolved. Discontinue gemcitabine as above. 6. Peripheral edema: Continue elevation nightly.  Lower extremity ultrasound did not reveal DVT.   7.  Diarrhea: Resolved.  Patient expressed understanding and was in agreement with this plan. He also understands that He can call clinic at any time with any questions, concerns, or complaints.   Cancer Staging Pancreatic cancer [facility]) Staging form: Exocrine Pancreas, AJCC 8th Edition - Clinical stage from 09/25/2016: Stage IV (cTX, cNX, pM1) - Signed by  Lloyd Huger, MD on 09/25/2016   Lloyd Huger, MD   04/21/2017 9:32 AM

## 2017-04-21 ENCOUNTER — Inpatient Hospital Stay (HOSPITAL_BASED_OUTPATIENT_CLINIC_OR_DEPARTMENT_OTHER): Payer: BLUE CROSS/BLUE SHIELD | Admitting: Oncology

## 2017-04-21 ENCOUNTER — Other Ambulatory Visit: Payer: Self-pay | Admitting: *Deleted

## 2017-04-21 ENCOUNTER — Inpatient Hospital Stay: Payer: BLUE CROSS/BLUE SHIELD

## 2017-04-21 VITALS — BP 105/76 | HR 98 | Temp 97.6°F | Resp 18 | Wt 123.3 lb

## 2017-04-21 DIAGNOSIS — R531 Weakness: Secondary | ICD-10-CM | POA: Diagnosis not present

## 2017-04-21 DIAGNOSIS — C259 Malignant neoplasm of pancreas, unspecified: Secondary | ICD-10-CM

## 2017-04-21 DIAGNOSIS — R600 Localized edema: Secondary | ICD-10-CM

## 2017-04-21 DIAGNOSIS — F17210 Nicotine dependence, cigarettes, uncomplicated: Secondary | ICD-10-CM

## 2017-04-21 DIAGNOSIS — C787 Secondary malignant neoplasm of liver and intrahepatic bile duct: Secondary | ICD-10-CM

## 2017-04-21 DIAGNOSIS — R5383 Other fatigue: Secondary | ICD-10-CM | POA: Diagnosis not present

## 2017-04-21 DIAGNOSIS — R109 Unspecified abdominal pain: Secondary | ICD-10-CM | POA: Diagnosis not present

## 2017-04-21 DIAGNOSIS — I4891 Unspecified atrial fibrillation: Secondary | ICD-10-CM | POA: Diagnosis not present

## 2017-04-21 DIAGNOSIS — E871 Hypo-osmolality and hyponatremia: Secondary | ICD-10-CM | POA: Diagnosis not present

## 2017-04-21 DIAGNOSIS — R5381 Other malaise: Secondary | ICD-10-CM

## 2017-04-21 DIAGNOSIS — J449 Chronic obstructive pulmonary disease, unspecified: Secondary | ICD-10-CM

## 2017-04-21 LAB — COMPREHENSIVE METABOLIC PANEL
ALK PHOS: 265 U/L — AB (ref 38–126)
ALT: 41 U/L (ref 17–63)
AST: 42 U/L — ABNORMAL HIGH (ref 15–41)
Albumin: 3.7 g/dL (ref 3.5–5.0)
Anion gap: 7 (ref 5–15)
BILIRUBIN TOTAL: 0.6 mg/dL (ref 0.3–1.2)
BUN: 8 mg/dL (ref 6–20)
CALCIUM: 9 mg/dL (ref 8.9–10.3)
CO2: 30 mmol/L (ref 22–32)
CREATININE: 0.5 mg/dL — AB (ref 0.61–1.24)
Chloride: 94 mmol/L — ABNORMAL LOW (ref 101–111)
GFR calc non Af Amer: >60 mL/min (ref >60)
GLUCOSE: 118 mg/dL — AB (ref 65–99)
Potassium: 4.2 mmol/L (ref 3.5–5.1)
SODIUM: 131 mmol/L — AB (ref 135–145)
TOTAL PROTEIN: 6.6 g/dL (ref 6.5–8.1)

## 2017-04-21 LAB — CBC WITH DIFFERENTIAL/PLATELET
BASOS PCT: 0 %
Basophils Absolute: 0 10*3/uL (ref 0–0.1)
EOS ABS: 0.8 10*3/uL — AB (ref 0–0.7)
Eosinophils Relative: 13 %
HCT: 44 % (ref 40.0–52.0)
Hemoglobin: 15 g/dL (ref 13.0–18.0)
Lymphocytes Relative: 30 %
Lymphs Abs: 1.8 10*3/uL (ref 1.0–3.6)
MCH: 32.3 pg (ref 26.0–34.0)
MCHC: 34.1 g/dL (ref 32.0–36.0)
MCV: 94.8 fL (ref 80.0–100.0)
MONO ABS: 0.3 10*3/uL (ref 0.2–1.0)
MONOS PCT: 5 %
Neutro Abs: 3.2 10*3/uL (ref 1.4–6.5)
Neutrophils Relative %: 52 %
Platelets: 308 10*3/uL (ref 150–440)
RBC: 4.65 MIL/uL (ref 4.40–5.90)
RDW: 14.5 % (ref 11.5–14.5)
WBC: 6.2 10*3/uL (ref 3.8–10.6)

## 2017-04-21 MED ORDER — PACLITAXEL PROTEIN-BOUND CHEMO INJECTION 100 MG
200.0000 mg | Freq: Once | INTRAVENOUS | Status: AC
  Administered 2017-04-21: 200 mg via INTRAVENOUS
  Filled 2017-04-21: qty 40

## 2017-04-21 MED ORDER — PROCHLORPERAZINE MALEATE 10 MG PO TABS
10.0000 mg | ORAL_TABLET | Freq: Once | ORAL | Status: AC
  Administered 2017-04-21: 10 mg via ORAL
  Filled 2017-04-21: qty 1

## 2017-04-21 MED ORDER — SODIUM CHLORIDE 0.9 % IV SOLN
Freq: Once | INTRAVENOUS | Status: AC
  Administered 2017-04-21: 10:00:00 via INTRAVENOUS
  Filled 2017-04-21: qty 1000

## 2017-04-21 NOTE — Progress Notes (Signed)
Patient is here today for follow up he is doing well no major complaints

## 2017-04-22 LAB — CANCER ANTIGEN 19-9: CAN 19-9: 139 U/mL — AB (ref 0–35)

## 2017-04-24 NOTE — Progress Notes (Signed)
[place]  Telephone:([phone] [phone]  ID: Benjamin Diaz. OB: July 11, 1960  MR#: [id number]  [id number]#:[id number]  Patient Care Team: Patient, [name] as PCP - General (General Practice) [name], MD as Consulting Physician (Oncology) [name], MD (Inactive) as Consulting Physician (Gastroenterology) [name], MD as Consulting Physician (Gastroenterology)  CHIEF COMPLAINT: Stage IV pancreatic cancer  INTERVAL HISTORY: Patient returns to clinic today for further evaluation and consideration of cycle 7, day 15 of single agent Abraxane. He continues to have intermittent abdominal cramping, but otherwise feels well.  He continues to  have edema in his right lower extremity which is worse in the evening.  He has no neurologic complaints. He denies any recent fevers or illnesses. His appetite has improved and his weight is stable. He has no chest pain or shortness of breath. He denies any nausea, vomiting, constipation, or diarrhea. He has no urinary complaints. Patient offers no further specific complaints today.  REVIEW OF SYSTEMS:   Review of Systems  Constitutional: Positive for malaise/fatigue. Negative for fever and weight loss.  Respiratory: Negative.  Negative for cough and shortness of breath.   Cardiovascular: Positive for leg swelling. Negative for chest pain.  Gastrointestinal: Positive for abdominal pain. Negative for diarrhea, nausea and vomiting.  Genitourinary: Negative.   Musculoskeletal: Negative.  Negative for joint pain.  Skin: Negative.  Negative for itching and rash.  Neurological: Positive for weakness. Negative for sensory change.  Psychiatric/Behavioral: Negative.  The patient is not nervous/anxious.     As per HPI. Otherwise, a complete review of systems is negative.  PAST MEDICAL HISTORY: Past Medical History:  Diagnosis Date  . Arthritis   . Asthma   . Atrial fibrillation ([place])   . Cancer ([place])   .  Closed left ankle fracture age 48  . COPD (chronic obstructive pulmonary disease) ([place])   . Dysrhythmia   . Pneumonia 2011  . Right shoulder injury 09/27/2016  . Sciatica    left leg pinched nerve numb at times about once a year    PAST SURGICAL HISTORY: Past Surgical History:  Procedure Laterality Date  . BILIARY STENT PLACEMENT N/A 09/01/2016   Procedure: BILIARY STENT PLACEMENT;  Surgeon: [name], MD;  [facility];  Service: Endoscopy;  Laterality: N/A;  . DIAGNOSTIC LAPAROSCOPIC LIVER BIOPSY  09/16/2016   Procedure: DIAGNOSTIC LAPAROSCOPIC LIVER BIOPSY;  Surgeon: [name], MD;  Location: [place];  Service: General;;  . ERCP N/A 07/09/2016   Procedure: ENDOSCOPIC RETROGRADE CHOLANGIOPANCREATOGRAPHY (ERCP);  Surgeon: [name], MD;  Location: [facility];  Service: Endoscopy;  Laterality: N/A;  . ERCP N/A 09/01/2016   Procedure: ENDOSCOPIC RETROGRADE CHOLANGIOPANCREATOGRAPHY (ERCP);  Surgeon: [name], MD;  Location: [facility];  Service: Endoscopy;  Laterality: N/A;  . ESOPHAGOGASTRODUODENOSCOPY (EGD) WITH PROPOFOL N/A 09/01/2016   Procedure: ESOPHAGOGASTRODUODENOSCOPY (EGD) WITH PROPOFOL;  Surgeon: [name], MD;  [facility];  Service: Gastroenterology;  Laterality: N/A;  . EUS N/A 09/01/2016   Procedure: ESOPHAGEAL ENDOSCOPIC ULTRASOUND (EUS) RADIAL;  Surgeon: [name], MD;  [facility];  Service: Endoscopy;  Laterality: N/A;  . FINE NEEDLE ASPIRATION N/A 09/01/2016   Procedure: FINE NEEDLE ASPIRATION (FNA) RADIAL;  Surgeon: [name], MD;  [facility];  Service: Endoscopy;  Laterality: N/A;  . FOREIGN BODY REMOVAL N/A 09/01/2016   Procedure: FOREIGN BODY REMOVAL;  Surgeon: [name], MD;  [facility];  Service: Gastroenterology;  Laterality: N/A;  . KNEE SURGERY Right 2002  arthroscopy  . SHOULDER SURGERY Right 2002    FAMILY HISTORY: Family History  Problem Relation Age of Onset  . Heart  disease Father     ADVANCED DIRECTIVES (Y/N):  N  HEALTH MAINTENANCE: Social History   Tobacco Use  . Smoking status: Current Every Day Smoker    Packs/day: 0.50    Years: 40.00    Pack years: 20.00    Types: Cigarettes  . Smokeless tobacco: Never Used  Substance Use Topics  . Alcohol use: Yes    Alcohol/week: 4.2 oz    Types: 7 Cans of beer per week    Comment: last drink 5 weeeks ago  april 2018  . Drug use: No     Colonoscopy:  PAP:  Bone density:  Lipid panel:  Allergies  Allergen Reactions  . No Known Allergies     Current Outpatient Medications  Medication Sig Dispense Refill  . albuterol (PROVENTIL HFA;VENTOLIN HFA) 108 (90 Base) MCG/ACT inhaler Inhale 1-2 puffs into the lungs every 6 (six) hours as needed for wheezing or shortness of breath. 1 Inhaler 6  . Ipratropium-Albuterol (COMBIVENT RESPIMAT) 20-100 MCG/ACT AERS respimat Inhale 1 puff into the lungs 4 (four) times daily.     No current facility-administered medications for this visit.     OBJECTIVE: Vitals:   04/28/17 0845  BP: (!) 144/91  Pulse: 63  Resp: 20  Temp: 98.4 F (36.9 C)     Body mass index is 16.65 kg/m.    ECOG FS:1 - Symptomatic but completely ambulatory  General: Thin, no acute distress. Eyes: Pink conjunctiva, anicteric sclera. Lungs: Clear to auscultation bilaterally. Heart: Regular rate and rhythm. No rubs, murmurs, or gallops. Abdomen: Soft, nontender, nondistended. No organomegaly noted, normoactive bowel sounds. Musculoskeletal: Scant edema in right lower extremity.   Neuro: Alert, answering all questions appropriately. Cranial nerves grossly intact. Skin: Maculopapular rash noted on torso. Psych: Normal affect.   LAB RESULTS:  Lab Results  Component Value Date   NA 129 (L) 04/28/2017   K 4.2 04/28/2017   CL 93 (L) 04/28/2017   CO2 27 04/28/2017   GLUCOSE 115 (H) 04/28/2017   BUN 7 04/28/2017   CREATININE 0.54 (L) 04/28/2017   CALCIUM 8.9 04/28/2017   PROT  6.6 04/28/2017   ALBUMIN 3.9 04/28/2017   AST 49 (H) 04/28/2017   ALT 46 04/28/2017   ALKPHOS 233 (H) 04/28/2017   BILITOT 0.6 04/28/2017   GFRNONAA >60 04/28/2017   GFRAA >60 04/28/2017    Lab Results  Component Value Date   WBC 3.6 (L) 04/28/2017   NEUTROABS 1.4 04/28/2017   HGB 14.9 04/28/2017   HCT 43.2 04/28/2017   MCV 94.8 04/28/2017   PLT 314 04/28/2017      STUDIES: Nm Pet Image Restag (ps) Skull Base To Thigh  Result Date: 04/12/2017 CLINICAL DATA:  Subsequent treatment strategy for pancreatic carcinoma. EXAM: NUCLEAR MEDICINE PET SKULL BASE TO THIGH TECHNIQUE: 12.9 mCi F-18 FDG was injected intravenously. Full-ring PET imaging was performed from the skull base to thigh after the radiotracer. CT data was obtained and used for attenuation correction and anatomic localization. FASTING BLOOD GLUCOSE:  Value: 99 mg/dl COMPARISON:  PET-CT 09/27/2016, CT 01/03/2017 FINDINGS: NECK No hypermetabolic lymph nodes in the neck. CHEST No hypermetabolic mediastinal or hilar nodes. No suspicious pulmonary nodules on the CT scan. Paraseptal emphysema at the apices. ABDOMEN/PELVIS There remains intense metabolic activity through the head of the pancreas along the common bile stent S with UV max 5.0  decreased from SUV max 9.6. The size of the pancreatic lesions difficult to ascertain on noncontrast Clear reduction in metabolic activity hepatic metastasis. No clear hypermetabolic activity above background. Extensive pneumobilia noted. No duct dilatation There is a rim metabolic activity associated gallbladder SUV max 2.9. This much improved from hypermetabolic mass along lateral margin of gallbladder SUV 16.6. No clear hypermetabolic adenopathy.  No new metastatic lesion SKELETON No focal hypermetabolic activity to suggest skeletal metastasis. IMPRESSION: 1. Marked positive response to therapy with near complete resolution metabolic activity within hepatic metastasis. 2. Marked in size and metabolic  activity of lesion the lateral margin the of the gallbladder. 3. Reduction in metabolic activity in the pancreatic head mass lesion. Significant activity does remain but significantly reduced. 4. No evidence of new or progressive carcinoma. Electronically Signed   By: Suzy Bouchard M.D.   On: 04/12/2017 15:02    ASSESSMENT: Stage IV pancreatic cancer  PLAN:   1. Stage IV pancreatic cancer: Imaging, pathology, and Op note reviewed independently confirming stage IV disease with multiple peritoneal implants.  PET scan results from April 12, 2017 reviewed independently and reported as above with near complete resolution of hepatic metastasis and reduction of metabolic activity in the pancreatic head.  Despite this, patient CA-19-9 continues to trend up and is now 139.  Today's result is pending.  Patient wishes to proceed with palliative chemotherapy. Given patient's rash, gemcitabine was discontinued. Abraxane okay to use now that his bilirubin and liver enzymes are within normal limits.  Proceed with cycle 7, day 15 of Abraxane today.  If patient tumor marker continues to trend up, will consider switching patient's treatment to modified FOLFIRINOX (oxaliplatin 85 mg/m, leucovorin 400 mg/m, irinotecan 150 mg/m, 5-FU 2400 mg/m over 46 hours) every 2 weeks.  Return to clinic in 2 weeks for further evaluation and either consideration of cycle 8, day 1 or switching treatments.  If patient switches treatments he will require port placement.   2. Pain: Intermittent and mild. Patient reports he is no longer taking oxycodone.  Patient was given a referral back to GI, Dr. [name], for evaluation and consideration of stent replacement. 3. Elevated liver enzymes: Resolved. Secondary to malignancy, monitor. 4. Hyponatremia: Decreased, but stable. Monitor. 5. Rash: Resolved. Discontinue gemcitabine as above. 6. Peripheral edema: Continue elevation nightly.  Lower extremity ultrasound did not reveal DVT.    7.  Diarrhea: Resolved.  Patient expressed understanding and was in agreement with this plan. He also understands that He can call clinic at any time with any questions, concerns, or complaints.   Cancer Staging Pancreatic cancer [facility]) Staging form: Exocrine Pancreas, AJCC 8th Edition - Clinical stage from 09/25/2016: Stage IV (cTX, cNX, pM1) - Signed by Lloyd Huger, MD on 09/25/2016   Lloyd Huger, MD   04/28/2017 9:24 AM

## 2017-04-27 ENCOUNTER — Other Ambulatory Visit: Payer: Self-pay | Admitting: Oncology

## 2017-04-28 ENCOUNTER — Encounter: Payer: Self-pay | Admitting: Oncology

## 2017-04-28 ENCOUNTER — Inpatient Hospital Stay: Payer: BLUE CROSS/BLUE SHIELD

## 2017-04-28 ENCOUNTER — Inpatient Hospital Stay (HOSPITAL_BASED_OUTPATIENT_CLINIC_OR_DEPARTMENT_OTHER): Payer: BLUE CROSS/BLUE SHIELD | Admitting: Oncology

## 2017-04-28 VITALS — BP 144/91 | HR 63 | Temp 98.4°F | Resp 20 | Wt 122.8 lb

## 2017-04-28 DIAGNOSIS — R531 Weakness: Secondary | ICD-10-CM | POA: Diagnosis not present

## 2017-04-28 DIAGNOSIS — R109 Unspecified abdominal pain: Secondary | ICD-10-CM | POA: Diagnosis not present

## 2017-04-28 DIAGNOSIS — C787 Secondary malignant neoplasm of liver and intrahepatic bile duct: Secondary | ICD-10-CM

## 2017-04-28 DIAGNOSIS — F17210 Nicotine dependence, cigarettes, uncomplicated: Secondary | ICD-10-CM

## 2017-04-28 DIAGNOSIS — C259 Malignant neoplasm of pancreas, unspecified: Secondary | ICD-10-CM | POA: Diagnosis not present

## 2017-04-28 DIAGNOSIS — R600 Localized edema: Secondary | ICD-10-CM

## 2017-04-28 DIAGNOSIS — R5383 Other fatigue: Secondary | ICD-10-CM

## 2017-04-28 DIAGNOSIS — E871 Hypo-osmolality and hyponatremia: Secondary | ICD-10-CM | POA: Diagnosis not present

## 2017-04-28 LAB — COMPREHENSIVE METABOLIC PANEL
ALBUMIN: 3.9 g/dL (ref 3.5–5.0)
ALK PHOS: 233 U/L — AB (ref 38–126)
ALT: 46 U/L (ref 17–63)
AST: 49 U/L — AB (ref 15–41)
Anion gap: 9 (ref 5–15)
BILIRUBIN TOTAL: 0.6 mg/dL (ref 0.3–1.2)
BUN: 7 mg/dL (ref 6–20)
CO2: 27 mmol/L (ref 22–32)
Calcium: 8.9 mg/dL (ref 8.9–10.3)
Chloride: 93 mmol/L — ABNORMAL LOW (ref 101–111)
Creatinine, Ser: 0.54 mg/dL — ABNORMAL LOW (ref 0.61–1.24)
GFR calc Af Amer: >60 mL/min (ref >60)
GFR calc non Af Amer: >60 mL/min (ref >60)
GLUCOSE: 115 mg/dL — AB (ref 65–99)
POTASSIUM: 4.2 mmol/L (ref 3.5–5.1)
Sodium: 129 mmol/L — ABNORMAL LOW (ref 135–145)
TOTAL PROTEIN: 6.6 g/dL (ref 6.5–8.1)

## 2017-04-28 LAB — CBC WITH DIFFERENTIAL/PLATELET
BASOS PCT: 0 %
Basophils Absolute: 0 10*3/uL (ref 0–0.1)
Eosinophils Absolute: 0.3 10*3/uL (ref 0–0.7)
Eosinophils Relative: 9 %
HEMATOCRIT: 43.2 % (ref 40.0–52.0)
HEMOGLOBIN: 14.9 g/dL (ref 13.0–18.0)
LYMPHS ABS: 1.5 10*3/uL (ref 1.0–3.6)
LYMPHS PCT: 43 %
MCH: 32.6 pg (ref 26.0–34.0)
MCHC: 34.4 g/dL (ref 32.0–36.0)
MCV: 94.8 fL (ref 80.0–100.0)
MONO ABS: 0.3 10*3/uL (ref 0.2–1.0)
MONOS PCT: 8 %
NEUTROS ABS: 1.4 10*3/uL (ref 1.4–6.5)
NEUTROS PCT: 40 %
Platelets: 314 10*3/uL (ref 150–440)
RBC: 4.56 MIL/uL (ref 4.40–5.90)
RDW: 14.5 % (ref 11.5–14.5)
WBC: 3.6 10*3/uL — ABNORMAL LOW (ref 3.8–10.6)

## 2017-04-28 MED ORDER — PACLITAXEL PROTEIN-BOUND CHEMO INJECTION 100 MG
200.0000 mg | Freq: Once | INTRAVENOUS | Status: AC
  Administered 2017-04-28: 200 mg via INTRAVENOUS
  Filled 2017-04-28: qty 40

## 2017-04-28 MED ORDER — SODIUM CHLORIDE 0.9 % IV SOLN
Freq: Once | INTRAVENOUS | Status: AC
  Administered 2017-04-28: 10:00:00 via INTRAVENOUS
  Filled 2017-04-28: qty 1000

## 2017-04-28 MED ORDER — PROCHLORPERAZINE MALEATE 10 MG PO TABS
10.0000 mg | ORAL_TABLET | Freq: Once | ORAL | Status: AC
  Administered 2017-04-28: 10 mg via ORAL
  Filled 2017-04-28: qty 1

## 2017-04-28 NOTE — Progress Notes (Signed)
Patient denies any concerns today.  

## 2017-04-29 LAB — CANCER ANTIGEN 19-9: CAN 19-9: 126 U/mL — AB (ref 0–35)

## 2017-05-06 NOTE — Progress Notes (Signed)
Benjamin Diaz  Telephone:([phone] Fax:([phone]  ID: Benjamin Diaz. OB: 01/11/1961  MR#: [id number]  SAY#:[id number]  Patient Care Team: Patient, [name] as PCP - General (General Practice) [name], MD as Consulting Physician (Oncology) [name], MD (Inactive) as Consulting Physician (Gastroenterology) [name], MD as Consulting Physician (Gastroenterology)  CHIEF COMPLAINT: Stage IV pancreatic cancer  INTERVAL HISTORY: Patient returns to clinic today for further evaluation and consideration of cycle 8, day 1 of single agent Abraxane. He continues to have intermittent abdominal cramping, but otherwise feels well.  He continues to  have edema in his right lower extremity which is worse in the evening.  He has no neurologic complaints. He denies any recent fevers or illnesses. His appetite has improved and his weight is stable. He has no chest pain or shortness of breath. He denies any nausea, vomiting, constipation, or diarrhea. He has no urinary complaints. Patient offers no further specific complaints today.  REVIEW OF SYSTEMS:   Review of Systems  Constitutional: Positive for malaise/fatigue. Negative for fever and weight loss.  Respiratory: Negative.  Negative for cough and shortness of breath.   Cardiovascular: Positive for leg swelling. Negative for chest pain.  Gastrointestinal: Positive for abdominal pain. Negative for diarrhea, nausea and vomiting.  Genitourinary: Negative.   Musculoskeletal: Negative.  Negative for joint pain.  Skin: Negative.  Negative for itching and rash.  Neurological: Positive for weakness. Negative for sensory change.  Psychiatric/Behavioral: Negative.  The patient is not nervous/anxious.     As per HPI. Otherwise, a complete review of systems is negative.  PAST MEDICAL HISTORY: Past Medical History:  Diagnosis Date  . Arthritis   . Asthma   . Atrial fibrillation ([place])   . Cancer ([place])   .  Closed left ankle fracture age 57  . COPD (chronic obstructive pulmonary disease) ([place])   . Dysrhythmia   . Pneumonia 2011  . Right shoulder injury 55/04/2016  . Sciatica    left leg pinched nerve numb at times about once a year    PAST SURGICAL HISTORY: Past Surgical History:  Procedure Laterality Date  . BILIARY STENT PLACEMENT N/A 09/01/2016   Procedure: BILIARY STENT PLACEMENT;  Surgeon: [name], MD;  [facility]: [facility];  Service: Endoscopy;  Laterality: N/A;  . DIAGNOSTIC LAPAROSCOPIC LIVER BIOPSY  09/16/2016   Procedure: DIAGNOSTIC LAPAROSCOPIC LIVER BIOPSY;  Surgeon: [name], MD;  Location: [place];  Service: General;;  . ERCP N/A 07/09/2016   Procedure: ENDOSCOPIC RETROGRADE CHOLANGIOPANCREATOGRAPHY (ERCP);  Surgeon: [name], MD;  Location: [facility];  Service: Endoscopy;  Laterality: N/A;  . ERCP N/A 09/01/2016   Procedure: ENDOSCOPIC RETROGRADE CHOLANGIOPANCREATOGRAPHY (ERCP);  Surgeon: [name], MD;  Location: [facility];  Service: Endoscopy;  Laterality: N/A;  . ESOPHAGOGASTRODUODENOSCOPY (EGD) WITH PROPOFOL N/A 09/01/2016   Procedure: ESOPHAGOGASTRODUODENOSCOPY (EGD) WITH PROPOFOL;  Surgeon: [name], MD;  [facility];  Service: Gastroenterology;  Laterality: N/A;  . EUS N/A 09/01/2016   Procedure: ESOPHAGEAL ENDOSCOPIC ULTRASOUND (EUS) RADIAL;  Surgeon: [name], MD;  [facility];  Service: Endoscopy;  Laterality: N/A;  . FINE NEEDLE ASPIRATION N/A 09/01/2016   Procedure: FINE NEEDLE ASPIRATION (FNA) RADIAL;  Surgeon: [name], MD;  [facility];  Service: Endoscopy;  Laterality: N/A;  . FOREIGN BODY REMOVAL N/A 09/01/2016   Procedure: FOREIGN BODY REMOVAL;  Surgeon: [name], MD;  [facility];  Service: Gastroenterology;  Laterality: N/A;  . KNEE SURGERY Right 2002  arthroscopy  . SHOULDER SURGERY Right 2002    FAMILY HISTORY: Family History  Problem Relation Age of Onset  . Heart  disease Father     ADVANCED DIRECTIVES (Y/N):  N  HEALTH MAINTENANCE: Social History   Tobacco Use  . Smoking status: Current Every Day Smoker    Packs/day: 0.50    Years: 40.00    Pack years: 20.00    Types: Cigarettes  . Smokeless tobacco: Never Used  Substance Use Topics  . Alcohol use: Yes    Alcohol/week: 4.2 oz    Types: 7 Cans of beer per week    Comment: last drink 5 weeeks ago  april 2018  . Drug use: No     Colonoscopy:  PAP:  Bone density:  Lipid panel:  Allergies  Allergen Reactions  . No Known Allergies     Current Outpatient Medications  Medication Sig Dispense Refill  . albuterol (PROVENTIL HFA;VENTOLIN HFA) 108 (90 Base) MCG/ACT inhaler Inhale 1-2 puffs into the lungs every 6 (six) hours as needed for wheezing or shortness of breath. 1 Inhaler 6  . Ipratropium-Albuterol (COMBIVENT RESPIMAT) 20-100 MCG/ACT AERS respimat Inhale 1 puff into the lungs 4 (four) times daily.     No current facility-administered medications for this visit.     OBJECTIVE: Vitals:   05/11/17 0914  BP: 125/86  Pulse: 76  Resp: 16  Temp: (!) 96.9 F (36.1 C)  SpO2: 95%     Body mass index is 16.68 kg/m.    ECOG FS:1 - Symptomatic but completely ambulatory  General: Thin, no acute distress. Eyes: Pink conjunctiva, anicteric sclera. Lungs: Clear to auscultation bilaterally. Heart: Regular rate and rhythm. No rubs, murmurs, or gallops. Abdomen: Soft, nontender, nondistended. No organomegaly noted, normoactive bowel sounds. Musculoskeletal: Scant edema in right lower extremity.   Neuro: Alert, answering all questions appropriately. Cranial nerves grossly intact. Skin: No petechiae or rash noted. Psych: Normal affect.   LAB RESULTS:  Lab Results  Component Value Date   NA 130 (L) 05/11/2017   K 4.4 05/11/2017   CL 94 (L) 05/11/2017   CO2 30 05/11/2017   GLUCOSE 108 (H) 05/11/2017   BUN 6 05/11/2017   CREATININE 0.58 (L) 05/11/2017   CALCIUM 8.9 05/11/2017    PROT 7.1 05/11/2017   ALBUMIN 3.9 05/11/2017   AST 47 (H) 05/11/2017   ALT 55 05/11/2017   ALKPHOS 339 (H) 05/11/2017   BILITOT 0.5 05/11/2017   GFRNONAA >60 05/11/2017   GFRAA >60 05/11/2017    Lab Results  Component Value Date   WBC 5.7 05/11/2017   NEUTROABS 2.6 05/11/2017   HGB 15.0 05/11/2017   HCT 43.6 05/11/2017   MCV 94.5 05/11/2017   PLT 417 05/11/2017      STUDIES: No results found.  ASSESSMENT: Stage IV pancreatic cancer  PLAN:   1. Stage IV pancreatic cancer: Imaging, pathology, and Op note reviewed independently confirming stage IV disease with multiple peritoneal implants.  PET scan results from April 12, 2017 reviewed independently with near complete resolution of hepatic metastasis and reduction of metabolic activity in the pancreatic head.  Patient CA-19-9 trended down slightly, but today's result has increased to 158. Patient wishes to proceed with palliative chemotherapy. Given patient's rash, gemcitabine was discontinued. Abraxane okay to use now that his bilirubin and liver enzymes are within normal limits.  Proceed with cycle 8, day 1 of Abraxane today.  If patient tumor marker continues to trend up, will consider switching patient's treatment to modified  FOLFIRINOX (oxaliplatin 85 mg/m, leucovorin 400 mg/m, irinotecan 150 mg/m, 5-FU 2400 mg/m over 46 hours) every 2 weeks.  Return to clinic in 1 week for further evaluation and either consideration of cycle 8, day 8.  If patient switches treatments he will require port placement.   2. Pain: Intermittent and mild. Patient reports he is no longer taking oxycodone.  Patient was given a referral back to GI, Dr. [name], for evaluation and consideration of stent replacement. 3. Elevated liver enzymes: Resolved. Secondary to malignancy, monitor. 4. Hyponatremia: Decreased, but stable. Monitor. 5. Rash: Resolved. Discontinue gemcitabine as above. 6. Peripheral edema: Continue elevation nightly.  Lower  extremity ultrasound did not reveal DVT.   7.  Diarrhea: Resolved.  Patient expressed understanding and was in agreement with this plan. He also understands that He can call clinic at any time with any questions, concerns, or complaints.   Cancer Staging Pancreatic cancer [facility]) Staging form: Exocrine Pancreas, AJCC 8th Edition - Clinical stage from 09/25/2016: Stage IV (cTX, cNX, pM1) - Signed by Lloyd Huger, MD on 09/25/2016   Lloyd Huger, MD   05/14/2017 7:08 AM

## 2017-05-10 ENCOUNTER — Other Ambulatory Visit: Payer: Self-pay | Admitting: *Deleted

## 2017-05-10 ENCOUNTER — Other Ambulatory Visit: Payer: Self-pay | Admitting: Oncology

## 2017-05-10 DIAGNOSIS — C259 Malignant neoplasm of pancreas, unspecified: Secondary | ICD-10-CM

## 2017-05-11 ENCOUNTER — Inpatient Hospital Stay: Payer: BLUE CROSS/BLUE SHIELD | Attending: Oncology

## 2017-05-11 ENCOUNTER — Encounter: Payer: Self-pay | Admitting: General Practice

## 2017-05-11 ENCOUNTER — Inpatient Hospital Stay (HOSPITAL_BASED_OUTPATIENT_CLINIC_OR_DEPARTMENT_OTHER): Payer: BLUE CROSS/BLUE SHIELD | Admitting: Oncology

## 2017-05-11 ENCOUNTER — Encounter: Payer: Self-pay | Admitting: Oncology

## 2017-05-11 ENCOUNTER — Inpatient Hospital Stay: Payer: BLUE CROSS/BLUE SHIELD

## 2017-05-11 VITALS — BP 125/86 | HR 76 | Temp 96.9°F | Resp 16 | Wt 123.0 lb

## 2017-05-11 DIAGNOSIS — C787 Secondary malignant neoplasm of liver and intrahepatic bile duct: Secondary | ICD-10-CM | POA: Insufficient documentation

## 2017-05-11 DIAGNOSIS — C250 Malignant neoplasm of head of pancreas: Secondary | ICD-10-CM

## 2017-05-11 DIAGNOSIS — R109 Unspecified abdominal pain: Secondary | ICD-10-CM

## 2017-05-11 DIAGNOSIS — R600 Localized edema: Secondary | ICD-10-CM

## 2017-05-11 DIAGNOSIS — E871 Hypo-osmolality and hyponatremia: Secondary | ICD-10-CM

## 2017-05-11 DIAGNOSIS — C259 Malignant neoplasm of pancreas, unspecified: Secondary | ICD-10-CM

## 2017-05-11 DIAGNOSIS — Z5111 Encounter for antineoplastic chemotherapy: Secondary | ICD-10-CM | POA: Diagnosis not present

## 2017-05-11 DIAGNOSIS — F17210 Nicotine dependence, cigarettes, uncomplicated: Secondary | ICD-10-CM

## 2017-05-11 LAB — CBC WITH DIFFERENTIAL/PLATELET
BASOS ABS: 0.1 10*3/uL (ref 0–0.1)
Basophils Relative: 1 %
Eosinophils Absolute: 0.3 10*3/uL (ref 0–0.7)
Eosinophils Relative: 5 %
HEMATOCRIT: 43.6 % (ref 40.0–52.0)
HEMOGLOBIN: 15 g/dL (ref 13.0–18.0)
LYMPHS PCT: 30 %
Lymphs Abs: 1.7 10*3/uL (ref 1.0–3.6)
MCH: 32.5 pg (ref 26.0–34.0)
MCHC: 34.4 g/dL (ref 32.0–36.0)
MCV: 94.5 fL (ref 80.0–100.0)
Monocytes Absolute: 1 10*3/uL (ref 0.2–1.0)
Monocytes Relative: 18 %
NEUTROS ABS: 2.6 10*3/uL (ref 1.4–6.5)
NEUTROS PCT: 46 %
Platelets: 417 10*3/uL (ref 150–440)
RBC: 4.62 MIL/uL (ref 4.40–5.90)
RDW: 14.2 % (ref 11.5–14.5)
WBC: 5.7 10*3/uL (ref 3.8–10.6)

## 2017-05-11 LAB — COMPREHENSIVE METABOLIC PANEL
ALT: 55 U/L (ref 17–63)
AST: 47 U/L — AB (ref 15–41)
Albumin: 3.9 g/dL (ref 3.5–5.0)
Alkaline Phosphatase: 339 U/L — ABNORMAL HIGH (ref 38–126)
Anion gap: 6 (ref 5–15)
BILIRUBIN TOTAL: 0.5 mg/dL (ref 0.3–1.2)
BUN: 6 mg/dL (ref 6–20)
CO2: 30 mmol/L (ref 22–32)
CREATININE: 0.58 mg/dL — AB (ref 0.61–1.24)
Calcium: 8.9 mg/dL (ref 8.9–10.3)
Chloride: 94 mmol/L — ABNORMAL LOW (ref 101–111)
GFR calc Af Amer: >60 mL/min (ref >60)
GFR calc non Af Amer: >60 mL/min (ref >60)
GLUCOSE: 108 mg/dL — AB (ref 65–99)
Potassium: 4.4 mmol/L (ref 3.5–5.1)
Sodium: 130 mmol/L — ABNORMAL LOW (ref 135–145)
TOTAL PROTEIN: 7.1 g/dL (ref 6.5–8.1)

## 2017-05-11 MED ORDER — SODIUM CHLORIDE 0.9 % IV SOLN
Freq: Once | INTRAVENOUS | Status: AC
  Administered 2017-05-11: 10:00:00 via INTRAVENOUS
  Filled 2017-05-11: qty 1000

## 2017-05-11 MED ORDER — PACLITAXEL PROTEIN-BOUND CHEMO INJECTION 100 MG
200.0000 mg | Freq: Once | INTRAVENOUS | Status: AC
  Administered 2017-05-11: 200 mg via INTRAVENOUS
  Filled 2017-05-11: qty 40

## 2017-05-11 MED ORDER — PROCHLORPERAZINE MALEATE 10 MG PO TABS
10.0000 mg | ORAL_TABLET | Freq: Once | ORAL | Status: AC
  Administered 2017-05-11: 10 mg via ORAL
  Filled 2017-05-11: qty 1

## 2017-05-11 NOTE — Progress Notes (Signed)
[facility] CSW Progress Note  CSW received referral from Dr Grayland Ormond, states that patient is losing his insurance and has applied for Medicaid.  Spoke w patient by phone.  Patient turned in [place] application to [employer] on 05/10/17 (case worker is Ms Alveta Heimlich - [phone]).  Has been approved for disability from [facility], applied through his lawyer. Last worked at [employer] June 2018.  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  Has little/no support in the community.  Lives alone.  CSW reviewed process of Medicaid application, advised patient to keep in touch w Medicaid worker to make sure no additional documents are needed.  Will mail patient information on Patient and [facility], encouraged him to contact as needed for support and resources.  Edwyna Shell, LCSW Clinical Social Worker Phone:  [phone]

## 2017-05-12 LAB — CANCER ANTIGEN 19-9: CA 19-9: 158 U/mL — ABNORMAL HIGH (ref 0–35)

## 2017-05-15 NOTE — Progress Notes (Signed)
Benjamin Diaz  Telephone:([phone] Fax:([phone]  ID: Benjamin Diaz. OB: 10/27/1960  MR#: [id number]  PYP#:[id number]  Patient Care Team: Patient, [name] as PCP - General (General Practice) [name], MD as Consulting Physician (Oncology) [name], MD (Inactive) as Consulting Physician (Gastroenterology) [name], MD as Consulting Physician (Gastroenterology)  CHIEF COMPLAINT: Stage IV pancreatic cancer  INTERVAL HISTORY: Patient returns to clinic today for further evaluation and consideration of cycle 8, day 8 of single agent Abraxane. He continues to have intermittent abdominal cramping, but otherwise feels well.  He is about to lose insurance and has multiple financial concerns.  He continues to  have edema in his right lower   extremity which is worse in the evening.  He has no neurologic complaints. He denies any recent fevers or illnesses. His appetite has improved and his weight is stable. He has no chest pain or shortness of breath. He denies any nausea, vomiting, constipation, or diarrhea. He has no urinary complaints. Patient offers no further specific complaints today.  REVIEW OF SYSTEMS:   Review of Systems  Constitutional: Positive for malaise/fatigue. Negative for fever and weight loss.  Respiratory: Negative.  Negative for cough and shortness of breath.   Cardiovascular: Positive for leg swelling. Negative for chest pain.  Gastrointestinal: Positive for abdominal pain. Negative for diarrhea, nausea and vomiting.  Genitourinary: Negative.   Musculoskeletal: Negative.  Negative for joint pain.  Skin: Negative.  Negative for itching and rash.  Neurological: Positive for weakness. Negative for sensory change.  Psychiatric/Behavioral: Negative.  The patient is not nervous/anxious.     As per HPI. Otherwise, a complete review of systems is negative.  PAST MEDICAL HISTORY: Past Medical History:  Diagnosis Date  . Arthritis    . Asthma   . Atrial fibrillation ([place])   . Cancer ([place])   . Closed left ankle fracture age 32  . COPD (chronic obstructive pulmonary disease) ([place])   . Dysrhythmia   . Pneumonia 2011  . Right shoulder injury 09/27/2016  . Sciatica    left leg pinched nerve numb at times about once a year    PAST SURGICAL HISTORY: Past Surgical History:  Procedure Laterality Date  . BILIARY STENT PLACEMENT N/A 09/01/2016   Procedure: BILIARY STENT PLACEMENT;  Surgeon: [name], MD;  [facility];  Service: Endoscopy;  Laterality: N/A;  . DIAGNOSTIC LAPAROSCOPIC LIVER BIOPSY  09/16/2016   Procedure: DIAGNOSTIC LAPAROSCOPIC LIVER BIOPSY;  Surgeon: [name], MD;  Location: [place];  Service: General;;  . ERCP N/A 07/09/2016   Procedure: ENDOSCOPIC RETROGRADE CHOLANGIOPANCREATOGRAPHY (ERCP);  Surgeon: [name], MD;  [facility];  Service: Endoscopy;  Laterality: N/A;  . ERCP N/A 09/01/2016   Procedure: ENDOSCOPIC RETROGRADE CHOLANGIOPANCREATOGRAPHY (ERCP);  Surgeon: [name], MD;  Location[facility];  Service: Endoscopy;  Laterality: N/A;  . ESOPHAGOGASTRODUODENOSCOPY (EGD) WITH PROPOFOL N/A 09/01/2016   Procedure: ESOPHAGOGASTRODUODENOSCOPY (EGD) WITH PROPOFOL;  Surgeon: [name], MD;  [facility];  Service: Gastroenterology;  Laterality: N/A;  . EUS N/A 09/01/2016   Procedure: ESOPHAGEAL ENDOSCOPIC ULTRASOUND (EUS) RADIAL;  Surgeon: [name], MD;  [facility];  Service: Endoscopy;  Laterality: N/A;  . FINE NEEDLE ASPIRATION N/A 09/01/2016   Procedure: FINE NEEDLE ASPIRATION (FNA) RADIAL;  Surgeon: [name], MD;  [facility];  Service: Endoscopy;  Laterality: N/A;  . FOREIGN BODY REMOVAL N/A 09/01/2016   Procedure: FOREIGN BODY REMOVAL;  Surgeon: [name], MD;  [facility];  Service: Gastroenterology;  Laterality: N/A;  . KNEE SURGERY Right 2002   arthroscopy  . SHOULDER SURGERY Right 2002    FAMILY  HISTORY: Family History  Problem Relation Age of Onset  . Heart disease Father     ADVANCED DIRECTIVES (Y/N):  N  HEALTH MAINTENANCE: Social History   Tobacco Use  . Smoking status: Current Every Day Smoker    Packs/day: 0.50    Years: 40.00    Pack years: 20.00    Types: Cigarettes  . Smokeless tobacco: Never Used  Substance Use Topics  . Alcohol use: Yes    Alcohol/week: 4.2 oz    Types: 7 Cans of beer per week    Comment: last drink 5 weeeks ago  april 2018  . Drug use: No     Colonoscopy:  PAP:  Bone density:  Lipid panel:  Allergies  Allergen Reactions  . No Known Allergies     Current Outpatient Medications  Medication Sig Dispense Refill  . albuterol (PROVENTIL HFA;VENTOLIN HFA) 108 (90 Base) MCG/ACT inhaler Inhale 1-2 puffs into the lungs every 6 (six) hours as needed for wheezing or shortness of breath. 1 Inhaler 6  . Ipratropium-Albuterol (COMBIVENT RESPIMAT) 20-100 MCG/ACT AERS respimat Inhale 1 puff into the lungs 4 (four) times daily.     No current facility-administered medications for this visit.     OBJECTIVE: Vitals:   05/18/17 0944  BP: 120/79  Pulse: 79  Resp: 20  Temp: (!) 96.8 F (36 C)     Body mass index is 16.75 kg/m.    ECOG FS:1 - Symptomatic but completely ambulatory  General: Thin, no acute distress. Eyes: Pink conjunctiva, anicteric sclera. Lungs: Clear to auscultation bilaterally. Heart: Regular rate and rhythm. No rubs, murmurs, or gallops. Abdomen: Soft, nontender, nondistended. No organomegaly noted, normoactive bowel sounds. Musculoskeletal: Scant edema in right lower extremity.   Neuro: Alert, answering all questions appropriately. Cranial nerves grossly intact. Skin: No petechiae or rash noted. Psych: Normal affect.   LAB RESULTS:  Lab Results  Component Value Date   NA 130 (L) 05/18/2017   K 4.8 05/18/2017   CL 96 (L) 05/18/2017   CO2 29 05/18/2017   GLUCOSE 103 (H) 05/18/2017   BUN <5 (L) 05/18/2017    CREATININE 0.52 (L) 05/18/2017   CALCIUM 8.8 (L) 05/18/2017   PROT 6.7 05/18/2017   ALBUMIN 3.7 05/18/2017   AST 48 (H) 05/18/2017   ALT 70 (H) 05/18/2017   ALKPHOS 352 (H) 05/18/2017   BILITOT 0.6 05/18/2017   GFRNONAA >60 05/18/2017   GFRAA >60 05/18/2017    Lab Results  Component Value Date   WBC 6.6 05/18/2017   NEUTROABS 3.8 05/18/2017   HGB 14.4 05/18/2017   HCT 41.7 05/18/2017   MCV 94.3 05/18/2017   PLT 332 05/18/2017      STUDIES: No results found.  ASSESSMENT: Stage IV pancreatic cancer  PLAN:   1. Stage IV pancreatic cancer: Imaging, pathology, and Op note reviewed independently confirming stage IV disease with multiple peritoneal implants.  PET scan results from April 12, 2017 reviewed independently with near complete resolution of hepatic metastasis and reduction of metabolic activity in the pancreatic head.  Patient CA-19-9 is now trending back up, today's result is pending. Patient wishes to proceed with palliative chemotherapy. Given patient's rash, gemcitabine was discontinued. Proceed with cycle 8, day 8 of Abraxane today.  Patient will likely have to switch treatments in the near future to modified FOLFIRINOX (oxaliplatin 85 mg/m, leucovorin 400  mg/m, irinotecan 150 mg/m, 5-FU 2400 mg/m over 46 hours) every 2 weeks.  This will require a port placement, patient is hesitant to do.  Will continue single agent Abraxane for now.  Return to clinic in 1 week for further evaluation and consideration of cycle 8, day 15.   2. Pain: Intermittent and mild. Patient reports he is no longer taking oxycodone.  Patient was given a referral back to GI, Dr. [name], for evaluation and consideration of stent replacement. 3. Elevated liver enzymes: Mild. Secondary to malignancy, monitor. 4. Hyponatremia: Decreased, but stable. Monitor. 5. Rash: Resolved. Discontinue gemcitabine as above. 6. Peripheral edema: Continue elevation nightly.  Lower extremity ultrasound did  not reveal DVT.   7.  Diarrhea: Resolved. 8.  Financial concerns: Patient was given a referral to financial navigation.  Patient expressed understanding and was in agreement with this plan. He also understands that He can call clinic at any time with any questions, concerns, or complaints.   Cancer Staging Pancreatic cancer [facility]) Staging form: Exocrine Pancreas, AJCC 8th Edition - Clinical stage from 09/25/2016: Stage IV (cTX, cNX, pM1) - Signed by Lloyd Huger, MD on 09/25/2016   Lloyd Huger, MD   05/18/2017 9:45 AM

## 2017-05-17 ENCOUNTER — Other Ambulatory Visit: Payer: Self-pay | Admitting: Oncology

## 2017-05-18 ENCOUNTER — Inpatient Hospital Stay: Payer: BLUE CROSS/BLUE SHIELD

## 2017-05-18 ENCOUNTER — Inpatient Hospital Stay (HOSPITAL_BASED_OUTPATIENT_CLINIC_OR_DEPARTMENT_OTHER): Payer: BLUE CROSS/BLUE SHIELD | Admitting: Oncology

## 2017-05-18 VITALS — BP 120/79 | HR 79 | Temp 96.8°F | Resp 20 | Wt 123.5 lb

## 2017-05-18 DIAGNOSIS — E871 Hypo-osmolality and hyponatremia: Secondary | ICD-10-CM | POA: Diagnosis not present

## 2017-05-18 DIAGNOSIS — R109 Unspecified abdominal pain: Secondary | ICD-10-CM

## 2017-05-18 DIAGNOSIS — C259 Malignant neoplasm of pancreas, unspecified: Secondary | ICD-10-CM

## 2017-05-18 DIAGNOSIS — C787 Secondary malignant neoplasm of liver and intrahepatic bile duct: Secondary | ICD-10-CM

## 2017-05-18 DIAGNOSIS — R600 Localized edema: Secondary | ICD-10-CM | POA: Diagnosis not present

## 2017-05-18 DIAGNOSIS — C250 Malignant neoplasm of head of pancreas: Secondary | ICD-10-CM

## 2017-05-18 DIAGNOSIS — R748 Abnormal levels of other serum enzymes: Secondary | ICD-10-CM | POA: Diagnosis not present

## 2017-05-18 DIAGNOSIS — R5383 Other fatigue: Secondary | ICD-10-CM

## 2017-05-18 DIAGNOSIS — R531 Weakness: Secondary | ICD-10-CM

## 2017-05-18 DIAGNOSIS — Z5111 Encounter for antineoplastic chemotherapy: Secondary | ICD-10-CM | POA: Diagnosis not present

## 2017-05-18 LAB — COMPREHENSIVE METABOLIC PANEL
ALT: 70 U/L — ABNORMAL HIGH (ref 17–63)
ANION GAP: 5 (ref 5–15)
AST: 48 U/L — ABNORMAL HIGH (ref 15–41)
Albumin: 3.7 g/dL (ref 3.5–5.0)
Alkaline Phosphatase: 352 U/L — ABNORMAL HIGH (ref 38–126)
BUN: <5 mg/dL — ABNORMAL LOW (ref 6–20)
CALCIUM: 8.8 mg/dL — AB (ref 8.9–10.3)
CHLORIDE: 96 mmol/L — AB (ref 101–111)
CO2: 29 mmol/L (ref 22–32)
Creatinine, Ser: 0.52 mg/dL — ABNORMAL LOW (ref 0.61–1.24)
Glucose, Bld: 103 mg/dL — ABNORMAL HIGH (ref 65–99)
Potassium: 4.8 mmol/L (ref 3.5–5.1)
SODIUM: 130 mmol/L — AB (ref 135–145)
Total Bilirubin: 0.6 mg/dL (ref 0.3–1.2)
Total Protein: 6.7 g/dL (ref 6.5–8.1)

## 2017-05-18 LAB — CBC WITH DIFFERENTIAL/PLATELET
Basophils Absolute: 0 10*3/uL (ref 0–0.1)
Basophils Relative: 0 %
EOS ABS: 0.4 10*3/uL (ref 0–0.7)
EOS PCT: 7 %
HCT: 41.7 % (ref 40.0–52.0)
Hemoglobin: 14.4 g/dL (ref 13.0–18.0)
LYMPHS ABS: 1.8 10*3/uL (ref 1.0–3.6)
Lymphocytes Relative: 27 %
MCH: 32.5 pg (ref 26.0–34.0)
MCHC: 34.5 g/dL (ref 32.0–36.0)
MCV: 94.3 fL (ref 80.0–100.0)
MONOS PCT: 9 %
Monocytes Absolute: 0.6 10*3/uL (ref 0.2–1.0)
Neutro Abs: 3.8 10*3/uL (ref 1.4–6.5)
Neutrophils Relative %: 57 %
PLATELETS: 332 10*3/uL (ref 150–440)
RBC: 4.42 MIL/uL (ref 4.40–5.90)
RDW: 14.2 % (ref 11.5–14.5)
WBC: 6.6 10*3/uL (ref 3.8–10.6)

## 2017-05-18 MED ORDER — PROCHLORPERAZINE MALEATE 10 MG PO TABS
10.0000 mg | ORAL_TABLET | Freq: Once | ORAL | Status: AC
  Administered 2017-05-18: 10 mg via ORAL
  Filled 2017-05-18: qty 1

## 2017-05-18 MED ORDER — SODIUM CHLORIDE 0.9 % IV SOLN
Freq: Once | INTRAVENOUS | Status: AC
  Administered 2017-05-18: 10:00:00 via INTRAVENOUS
  Filled 2017-05-18: qty 1000

## 2017-05-18 MED ORDER — PACLITAXEL PROTEIN-BOUND CHEMO INJECTION 100 MG
200.0000 mg | Freq: Once | INTRAVENOUS | Status: AC
  Administered 2017-05-18: 200 mg via INTRAVENOUS
  Filled 2017-05-18: qty 40

## 2017-05-18 NOTE — Progress Notes (Signed)
Patient reports he lost his job 2 weeks ago. He is concerned about losing his insurance coverage.

## 2017-05-19 ENCOUNTER — Telehealth: Payer: Self-pay | Admitting: *Deleted

## 2017-05-19 NOTE — Telephone Encounter (Signed)
Patient called and states he has had problem with [facility] to get his tube in his stomach removed and that "we need another solution here"  Per office note he was to be referred to Dr Paulita Fujita GI for stent replacement.  Please advise

## 2017-05-19 NOTE — Telephone Encounter (Signed)
Ok, thank you

## 2017-05-19 NOTE — Telephone Encounter (Signed)
I called and spoke with Dr. Lavetta Nielsen office yesterday regarding referral that was sent several weeks ago. The office stated they received the referral and had reached out to the patient multiple times and that he needed to call the office to discuss scheduling follow up appointment. I left vm for patient to call and schedule his appointment. I am unsure about what the issue is.

## 2017-05-22 NOTE — Progress Notes (Signed)
[facility]  [phone] Fax:([phone]  ID: Benjamin Diaz. OB: [birth year]  MR#: [id number]  FIE#:[id number]  Patient Care Team: Patient, [name] as PCP - General (General Practice) [name], MD as Consulting Physician (Oncology) [name], MD (Inactive) as Consulting Physician (Gastroenterology) [name], MD as Consulting Physician (Gastroenterology)  CHIEF COMPLAINT: Stage IV pancreatic cancer  INTERVAL HISTORY: Patient returns to clinic today for further evaluation and consideration of cycle 8, day 15 of single agent Abraxane. He continues to have intermittent abdominal cramping, but otherwise feels well.  He was recently contacted by GI office for stent removal placement, but states he owes them money and cannot afford his appointments.  He continues to  have edema in his right lower extremity which is worse in the evening.  He has no neurologic complaints. He denies any recent fevers or illnesses. His appetite has improved and his weight is stable. He has no chest pain or shortness of breath. He denies any nausea, vomiting, constipation, or diarrhea. He has no urinary complaints. Patient offers no further specific complaints today.  REVIEW OF SYSTEMS:   Review of Systems  Constitutional: Positive for malaise/fatigue. Negative for fever and weight loss.  Respiratory: Negative.  Negative for cough and shortness of breath.   Cardiovascular: Positive for leg swelling. Negative for chest pain.  Gastrointestinal: Positive for abdominal pain. Negative for diarrhea, nausea and vomiting.  Genitourinary: Negative.   Musculoskeletal: Negative.  Negative for joint pain.  Skin: Negative.  Negative for itching and rash.  Neurological: Positive for weakness. Negative for sensory change.  Psychiatric/Behavioral: Negative.  The patient is not nervous/anxious.     As per HPI. Otherwise, a complete review of systems is negative.  PAST  MEDICAL HISTORY: Past Medical History:  Diagnosis Date  . Arthritis   . Asthma   . Atrial fibrillation ([place])   . Cancer ([place])   . Closed left ankle fracture age 47  . COPD (chronic obstructive pulmonary disease) ([place])   . Dysrhythmia   . Pneumonia 2011  . Right shoulder injury 09/27/2016  . Sciatica    left leg pinched nerve numb at times about once a year    PAST SURGICAL HISTORY: Past Surgical History:  Procedure Laterality Date  . BILIARY STENT PLACEMENT N/A 09/01/2016   Procedure: BILIARY STENT PLACEMENT;  Surgeon: [name], MD;  Location: [facility];  Service: Endoscopy;  Laterality: N/A;  . DIAGNOSTIC LAPAROSCOPIC LIVER BIOPSY  09/16/2016   Procedure: DIAGNOSTIC LAPAROSCOPIC LIVER BIOPSY;  Surgeon: [name], MD;  Location: [facility];  Service: General;;  . ERCP N/A 07/09/2016   Procedure: ENDOSCOPIC RETROGRADE CHOLANGIOPANCREATOGRAPHY (ERCP);  Surgeon: [name], MD;  Location: [facility];  Service: Endoscopy;  Laterality: N/A;  . ERCP N/A 09/01/2016   Procedure: ENDOSCOPIC RETROGRADE CHOLANGIOPANCREATOGRAPHY (ERCP);  Surgeon: [name], MD;  Location: [facility];  Service: Endoscopy;  Laterality: N/A;  . ESOPHAGOGASTRODUODENOSCOPY (EGD) WITH PROPOFOL N/A 09/01/2016   Procedure: ESOPHAGOGASTRODUODENOSCOPY (EGD) WITH PROPOFOL;  Surgeon: [name], MD;  [facility];  Service: Gastroenterology;  Laterality: N/A;  . EUS N/A 09/01/2016   Procedure: ESOPHAGEAL ENDOSCOPIC ULTRASOUND (EUS) RADIAL;  Surgeon: [name], MD;  [facility];  Service: Endoscopy;  Laterality: N/A;  . FINE NEEDLE ASPIRATION N/A 09/01/2016   Procedure: FINE NEEDLE ASPIRATION (FNA) RADIAL;  Surgeon: [name], MD;  [facility];  Service: Endoscopy;  Laterality: N/A;  . FOREIGN BODY REMOVAL N/A 09/01/2016   Procedure: FOREIGN BODY REMOVAL;  Surgeon: [name], MD;  Location: [facility];  Service: Gastroenterology;  Laterality: N/A;  . KNEE SURGERY Right  2002   arthroscopy  . SHOULDER SURGERY Right 2002    FAMILY HISTORY: Family History  Problem Relation Age of Onset  . Heart disease Father     ADVANCED DIRECTIVES (Y/N):  N  HEALTH MAINTENANCE: Social History   Tobacco Use  . Smoking status: Current Every Day Smoker    Packs/day: 0.50    Years: 40.00    Pack years: 20.00    Types: Cigarettes  . Smokeless tobacco: Never Used  Substance Use Topics  . Alcohol use: Yes    Alcohol/week: 4.2 oz    Types: 7 Cans of beer per week    Comment: last drink 5 weeeks ago  april 2018  . Drug use: No     Colonoscopy:  PAP:  Bone density:  Lipid panel:  Allergies  Allergen Reactions  . No Known Allergies     Current Outpatient Medications  Medication Sig Dispense Refill  . albuterol (PROVENTIL HFA;VENTOLIN HFA) 108 (90 Base) MCG/ACT inhaler Inhale 1-2 puffs into the lungs every 6 (six) hours as needed for wheezing or shortness of breath. 1 Inhaler 6  . Ipratropium-Albuterol (COMBIVENT RESPIMAT) 20-100 MCG/ACT AERS respimat Inhale 1 puff into the lungs 4 (four) times daily.     No current facility-administered medications for this visit.     OBJECTIVE: Vitals:   05/25/17 1000 05/25/17 1003  BP:  112/73  Pulse:  88  Resp: 16   Temp:  98.6 F (37 C)     Body mass index is 16.74 kg/m.    ECOG FS:1 - Symptomatic but completely ambulatory  General: Thin, no acute distress. Eyes: Pink conjunctiva, anicteric sclera. Lungs: Clear to auscultation bilaterally. Heart: Regular rate and rhythm. No rubs, murmurs, or gallops. Abdomen: Soft, nontender, nondistended. No organomegaly noted, normoactive bowel sounds. Musculoskeletal: Scant edema in right lower extremity.   Neuro: Alert, answering all questions appropriately. Cranial nerves grossly intact. Skin: No petechiae or rash noted. Psych: Normal affect.   LAB RESULTS:  Lab Results  Component Value Date   NA 126 (L) 05/25/2017   K 4.9 05/25/2017   CL 92 (L) 05/25/2017    CO2 25 05/25/2017   GLUCOSE 113 (H) 05/25/2017   BUN 9 05/25/2017   CREATININE 0.51 (L) 05/25/2017   CALCIUM 9.1 05/25/2017   PROT 6.9 05/25/2017   ALBUMIN 3.5 05/25/2017   AST 287 (H) 05/25/2017   ALT 192 (H) 05/25/2017   ALKPHOS 623 (H) 05/25/2017   BILITOT 0.8 05/25/2017   GFRNONAA >60 05/25/2017   GFRAA >60 05/25/2017    Lab Results  Component Value Date   WBC 5.4 05/25/2017   NEUTROABS 3.2 05/25/2017   HGB 13.8 05/25/2017   HCT 40.1 05/25/2017   MCV 93.8 05/25/2017   PLT 346 05/25/2017      STUDIES: No results found.  ASSESSMENT: Stage IV pancreatic cancer  PLAN:   1. Stage IV pancreatic cancer: Imaging, pathology, and Op note reviewed independently confirming stage IV disease with multiple peritoneal implants.  PET scan results from April 12, 2017 reviewed independently with near complete resolution of hepatic metastasis and reduction of metabolic activity in the pancreatic head.  Patient CA-19-9 is now trending back up and his most recent result was 158. Patient wishes to proceed with palliative chemotherapy. Given patient's rash, gemcitabine was discontinued.  Delay cycle 8, day 15 of Abraxane today secondary to elevated LFTs.  Patient will likely have to switch treatments in the near future to modified FOLFIRINOX (oxaliplatin 85 mg/m, leucovorin 400 mg/m, irinotecan 150 mg/m, 5-FU 2400 mg/m over 46 hours) every 2 weeks.  This will require a port placement, patient is hesitant to do.  Will continue single agent Abraxane for now.  Return to clinic in 1 week for further evaluation and reconsideration of cycle 8, day 15.   2. Pain: Intermittent and mild. Patient reports he is no longer taking oxycodone.  Patient states that he has outstanding bills for GI, Dr. [name], and cannot have his stent replacement at this time. 3. Elevated liver enzymes: Significantly worse this week.  Unclear etiology.  Delay treatment as above.  If not improved, will reimage. 4.  Hyponatremia: Decreased, but stable. Monitor. 5. Rash: Resolved. Discontinue gemcitabine as above. 6. Peripheral edema: Continue elevation nightly.  Lower extremity ultrasound did not reveal DVT.   7.  Diarrhea: Resolved. 8.  Financial concerns: Patient was given a referral to financial navigation.  Patient expressed understanding and was in agreement with this plan. He also understands that He can call clinic at any time with any questions, concerns, or complaints.   Cancer Staging Pancreatic cancer [facility]) Staging form: Exocrine Pancreas, AJCC 8th Edition - Clinical stage from 09/25/2016: Stage IV (cTX, cNX, pM1) - Signed by Lloyd Huger, MD on 09/25/2016   Lloyd Huger, MD   05/27/2017 1:33 PM

## 2017-05-25 ENCOUNTER — Encounter: Payer: Self-pay | Admitting: Oncology

## 2017-05-25 ENCOUNTER — Inpatient Hospital Stay (HOSPITAL_BASED_OUTPATIENT_CLINIC_OR_DEPARTMENT_OTHER): Payer: BLUE CROSS/BLUE SHIELD | Admitting: Oncology

## 2017-05-25 ENCOUNTER — Inpatient Hospital Stay: Payer: BLUE CROSS/BLUE SHIELD

## 2017-05-25 ENCOUNTER — Other Ambulatory Visit: Payer: Self-pay

## 2017-05-25 VITALS — BP 112/73 | HR 88 | Temp 98.6°F | Resp 16 | Ht 72.0 in | Wt 123.4 lb

## 2017-05-25 DIAGNOSIS — R531 Weakness: Secondary | ICD-10-CM | POA: Diagnosis not present

## 2017-05-25 DIAGNOSIS — C250 Malignant neoplasm of head of pancreas: Secondary | ICD-10-CM | POA: Diagnosis not present

## 2017-05-25 DIAGNOSIS — R5383 Other fatigue: Secondary | ICD-10-CM | POA: Diagnosis not present

## 2017-05-25 DIAGNOSIS — R109 Unspecified abdominal pain: Secondary | ICD-10-CM | POA: Diagnosis not present

## 2017-05-25 DIAGNOSIS — Z5111 Encounter for antineoplastic chemotherapy: Secondary | ICD-10-CM | POA: Diagnosis not present

## 2017-05-25 DIAGNOSIS — C259 Malignant neoplasm of pancreas, unspecified: Secondary | ICD-10-CM

## 2017-05-25 DIAGNOSIS — E871 Hypo-osmolality and hyponatremia: Secondary | ICD-10-CM | POA: Diagnosis not present

## 2017-05-25 DIAGNOSIS — R748 Abnormal levels of other serum enzymes: Secondary | ICD-10-CM | POA: Diagnosis not present

## 2017-05-25 DIAGNOSIS — R600 Localized edema: Secondary | ICD-10-CM

## 2017-05-25 DIAGNOSIS — F17210 Nicotine dependence, cigarettes, uncomplicated: Secondary | ICD-10-CM

## 2017-05-25 DIAGNOSIS — C787 Secondary malignant neoplasm of liver and intrahepatic bile duct: Secondary | ICD-10-CM | POA: Diagnosis not present

## 2017-05-25 LAB — COMPREHENSIVE METABOLIC PANEL
ALT: 192 U/L — AB (ref 17–63)
ANION GAP: 9 (ref 5–15)
AST: 287 U/L — AB (ref 15–41)
Albumin: 3.5 g/dL (ref 3.5–5.0)
Alkaline Phosphatase: 623 U/L — ABNORMAL HIGH (ref 38–126)
BILIRUBIN TOTAL: 0.8 mg/dL (ref 0.3–1.2)
BUN: 9 mg/dL (ref 6–20)
CALCIUM: 9.1 mg/dL (ref 8.9–10.3)
CHLORIDE: 92 mmol/L — AB (ref 101–111)
CO2: 25 mmol/L (ref 22–32)
Creatinine, Ser: 0.51 mg/dL — ABNORMAL LOW (ref 0.61–1.24)
GFR calc non Af Amer: >60 mL/min (ref >60)
GLUCOSE: 113 mg/dL — AB (ref 65–99)
Potassium: 4.9 mmol/L (ref 3.5–5.1)
Sodium: 126 mmol/L — ABNORMAL LOW (ref 135–145)
Total Protein: 6.9 g/dL (ref 6.5–8.1)

## 2017-05-25 LAB — CBC WITH DIFFERENTIAL/PLATELET
BASOS PCT: 1 %
Basophils Absolute: 0.1 10*3/uL (ref 0–0.1)
Eosinophils Absolute: 0.4 10*3/uL (ref 0–0.7)
Eosinophils Relative: 8 %
HCT: 40.1 % (ref 40.0–52.0)
Hemoglobin: 13.8 g/dL (ref 13.0–18.0)
LYMPHS ABS: 1.1 10*3/uL (ref 1.0–3.6)
LYMPHS PCT: 20 %
MCH: 32.4 pg (ref 26.0–34.0)
MCHC: 34.6 g/dL (ref 32.0–36.0)
MCV: 93.8 fL (ref 80.0–100.0)
MONO ABS: 0.6 10*3/uL (ref 0.2–1.0)
MONOS PCT: 11 %
NEUTROS ABS: 3.2 10*3/uL (ref 1.4–6.5)
Neutrophils Relative %: 60 %
Platelets: 346 10*3/uL (ref 150–440)
RBC: 4.27 MIL/uL — ABNORMAL LOW (ref 4.40–5.90)
RDW: 14.8 % — AB (ref 11.5–14.5)
WBC: 5.4 10*3/uL (ref 3.8–10.6)

## 2017-05-25 NOTE — Progress Notes (Signed)
Patient here for follow up no changes since last appt 

## 2017-05-27 ENCOUNTER — Other Ambulatory Visit: Payer: Self-pay | Admitting: Oncology

## 2017-05-28 NOTE — Progress Notes (Signed)
[facility]  [phone] [phone]  ID: [name]. OB: 1961/03/13  MR#: [id number]  VZD#:[id number]  Patient Care Team: Patient, [name] as PCP - General (General Practice) [name], MD as Consulting Physician (Oncology) [name], MD (Inactive) as Consulting Physician (Gastroenterology) [name], MD as Consulting Physician (Gastroenterology)  CHIEF COMPLAINT: Stage IV pancreatic cancer  INTERVAL HISTORY: Patient returns to clinic today for further evaluation and reconsideration of cycle 8, day 15 of single agent Abraxane. He has noticed a darkening of his urine and increased hair loss over the past week.  He continues to have intermittent abdominal cramping.  He continues to  have edema in his right lower extremity which is worse in the evening.  He has no neurologic complaints. He denies any recent fevers or illnesses. His appetite has improved and his weight is stable. He has no chest pain or shortness of breath. He denies any nausea, vomiting, constipation, or diarrhea. He has no urinary complaints. Patient offers no further specific complaints today.  REVIEW OF SYSTEMS:   Review of Systems  Constitutional: Positive for malaise/fatigue. Negative for fever and weight loss.  Respiratory: Negative.  Negative for cough and shortness of breath.   Cardiovascular: Positive for leg swelling. Negative for chest pain.  Gastrointestinal: Positive for abdominal pain. Negative for diarrhea, nausea and vomiting.  Genitourinary: Negative.  Negative for dysuria, frequency and urgency.  Musculoskeletal: Negative.  Negative for joint pain.  Skin: Negative.  Negative for itching and rash.  Neurological: Positive for weakness. Negative for sensory change.  Psychiatric/Behavioral: Negative.  The patient is not nervous/anxious.     As per HPI. Otherwise, a complete review of systems is negative.  PAST MEDICAL HISTORY: Past Medical  History:  Diagnosis Date  . Arthritis   . Asthma   . Atrial fibrillation ([place])   . Cancer ([place])   . Closed left ankle fracture age 66  . COPD (chronic obstructive pulmonary disease) ([place])   . Dysrhythmia   . Pneumonia 2011  . Right shoulder injury 09/27/2016  . Sciatica    left leg pinched nerve numb at times about once a year    PAST SURGICAL HISTORY: Past Surgical History:  Procedure Laterality Date  . BILIARY STENT PLACEMENT N/A 09/01/2016   Procedure: BILIARY STENT PLACEMENT;  Surgeon: [name], MD;  [facility];  Service: Endoscopy;  Laterality: N/A;  . DIAGNOSTIC LAPAROSCOPIC LIVER BIOPSY  09/16/2016   Procedure: DIAGNOSTIC LAPAROSCOPIC LIVER BIOPSY;  Surgeon: [name], MD;  Location: [facility];  Service: General;;  . ERCP N/A 07/09/2016   Procedure: ENDOSCOPIC RETROGRADE CHOLANGIOPANCREATOGRAPHY (ERCP);  Surgeon: [name], MD;  Location: [facility];  Service: Endoscopy;  Laterality: N/A;  . ERCP N/A 09/01/2016   Procedure: ENDOSCOPIC RETROGRADE CHOLANGIOPANCREATOGRAPHY (ERCP);  Surgeon: [name], MD;  Location: [facility];  Service: Endoscopy;  Laterality: N/A;  . ESOPHAGOGASTRODUODENOSCOPY (EGD) WITH PROPOFOL N/A 09/01/2016   Procedure: ESOPHAGOGASTRODUODENOSCOPY (EGD) WITH PROPOFOL;  Surgeon: [name], MD;  [facility];  Service: Gastroenterology;  Laterality: N/A;  . EUS N/A 09/01/2016   Procedure: ESOPHAGEAL ENDOSCOPIC ULTRASOUND (EUS) RADIAL;  Surgeon: [name], MD;  [facility];  Service: Endoscopy;  Laterality: N/A;  . FINE NEEDLE ASPIRATION N/A 09/01/2016   Procedure: FINE NEEDLE ASPIRATION (FNA) RADIAL;  Surgeon: [name], MD;  [facility];  Service: Endoscopy;  Laterality: N/A;  . FOREIGN BODY REMOVAL N/A 09/01/2016   Procedure: FOREIGN BODY REMOVAL;  Surgeon: [name],  MD;  [facility];  Service: Gastroenterology;  Laterality: N/A;  . KNEE SURGERY Right 2002   arthroscopy  . SHOULDER  SURGERY Right 2002    FAMILY HISTORY: Family History  Problem Relation Age of Onset  . Heart disease Father     ADVANCED DIRECTIVES (Y/N):  N  HEALTH MAINTENANCE: Social History   Tobacco Use  . Smoking status: Current Every Day Smoker    Packs/day: 0.50    Years: 40.00    Pack years: 20.00    Types: Cigarettes  . Smokeless tobacco: Never Used  Substance Use Topics  . Alcohol use: Yes    Alcohol/week: 4.2 oz    Types: 7 Cans of beer per week    Comment: last drink 5 weeeks ago  april 2018  . Drug use: No     Colonoscopy:  PAP:  Bone density:  Lipid panel:  Allergies  Allergen Reactions  . No Known Allergies     Current Outpatient Medications  Medication Sig Dispense Refill  . albuterol (PROVENTIL HFA;VENTOLIN HFA) 108 (90 Base) MCG/ACT inhaler Inhale 1-2 puffs into the lungs every 6 (six) hours as needed for wheezing or shortness of breath. 1 Inhaler 6  . Ipratropium-Albuterol (COMBIVENT RESPIMAT) 20-100 MCG/ACT AERS respimat Inhale 1 puff into the lungs 4 (four) times daily.     No current facility-administered medications for this visit.     OBJECTIVE: Vitals:   06/01/17 0831  BP: 112/75  Pulse: 88  Resp: 18  Temp: (!) 96.3 F (35.7 C)     Body mass index is 16.27 kg/m.    ECOG FS:1 - Symptomatic but completely ambulatory  General: Thin, no acute distress. Eyes: Pink conjunctiva, anicteric sclera. Lungs: Clear to auscultation bilaterally. Heart: Regular rate and rhythm. No rubs, murmurs, or gallops. Abdomen: Soft, nontender, nondistended. No organomegaly noted, normoactive bowel sounds. Musculoskeletal: Scant edema in right lower extremity.   Neuro: Alert, answering all questions appropriately. Cranial nerves grossly intact. Skin: No petechiae or rash noted. Psych: Normal affect.   LAB RESULTS:  Lab Results  Component Value Date   NA 126 (L) 06/01/2017   K 3.7 06/01/2017   CL 89 (L) 06/01/2017   CO2 28 06/01/2017   GLUCOSE 119 (H)  06/01/2017   BUN 7 06/01/2017   CREATININE 0.54 (L) 06/01/2017   CALCIUM 8.9 06/01/2017   PROT 6.8 06/01/2017   ALBUMIN 3.2 (L) 06/01/2017   AST 193 (H) 06/01/2017   ALT 304 (H) 06/01/2017   ALKPHOS 1,113 (H) 06/01/2017   BILITOT 3.0 (H) 06/01/2017   GFRNONAA >60 06/01/2017   GFRAA >60 06/01/2017    Lab Results  Component Value Date   WBC 6.9 06/01/2017   NEUTROABS 3.9 06/01/2017   HGB 14.0 06/01/2017   HCT 40.5 06/01/2017   MCV 92.6 06/01/2017   PLT 431 06/01/2017      STUDIES: No results found.  ASSESSMENT: Stage IV pancreatic cancer  PLAN:   1. Stage IV pancreatic cancer: Imaging, pathology, and Op note reviewed independently confirming stage IV disease with multiple peritoneal implants.  PET scan results from April 12, 2017 reviewed independently with near complete resolution of hepatic metastasis and reduction of metabolic activity in the pancreatic head.  Patient CA-19-9 is now trending back up and his most recent result was 158. Patient wishes to proceed with palliative chemotherapy. Given patient's rash, gemcitabine was discontinued.  Cycle 8, day 15 of Abraxane will be delayed once again today secondary to elevated LFTs.  Patient will  likely have to switch treatments in the near future to modified FOLFIRINOX (oxaliplatin 85 mg/m, leucovorin 400 mg/m, irinotecan 150 mg/m, 5-FU 2400 mg/m over 46 hours) every 2 weeks.  This will require a port placement, patient is hesitant to do.  Will continue single agent Abraxane for now.  Have ordered a stat MRI to evaluate his liver as well as his biliary stent since treatment is once again delayed secondary to his liver enzymes.  Have also sent an urgent referral to GI for further evaluation and stent replacement.  Return to clinic after his stent exchanged for repeat laboratory work to assess whether continuation of treatment is possible. 2. Pain: Intermittent and mild. Patient reports he is no longer taking oxycodone. 3.  Elevated liver enzymes: AST, ALT, and bilirubin continue to trend up.  Likely secondary to malfunctioning stent.  Stat MRI and urgent GI referral as above.   4. Hyponatremia: Decreased, but stable. Monitor. 5. Rash: Resolved. Discontinue gemcitabine as above. 6. Peripheral edema: Continue elevation nightly.  Lower extremity ultrasound did not reveal DVT.   7.  Diarrhea: Resolved. 8.  Financial concerns: Patient was given a referral to financial navigation.  Patient expressed understanding and was in agreement with this plan. He also understands that He can call clinic at any time with any questions, concerns, or complaints.   Cancer Staging Pancreatic cancer [facility]) Staging form: Exocrine Pancreas, AJCC 8th Edition - Clinical stage from 09/25/2016: Stage IV (cTX, cNX, pM1) - Signed by Lloyd Huger, MD on 09/25/2016   Lloyd Huger, MD   06/01/2017 9:47 AM

## 2017-06-01 ENCOUNTER — Inpatient Hospital Stay: Payer: Medicaid Other | Attending: Oncology

## 2017-06-01 ENCOUNTER — Inpatient Hospital Stay: Payer: Medicaid Other

## 2017-06-01 ENCOUNTER — Inpatient Hospital Stay (HOSPITAL_BASED_OUTPATIENT_CLINIC_OR_DEPARTMENT_OTHER): Payer: Medicaid Other | Admitting: Oncology

## 2017-06-01 ENCOUNTER — Other Ambulatory Visit: Payer: Self-pay

## 2017-06-01 VITALS — BP 112/75 | HR 88 | Temp 96.3°F | Resp 18 | Wt 120.0 lb

## 2017-06-01 DIAGNOSIS — C250 Malignant neoplasm of head of pancreas: Secondary | ICD-10-CM

## 2017-06-01 DIAGNOSIS — R600 Localized edema: Secondary | ICD-10-CM | POA: Diagnosis not present

## 2017-06-01 DIAGNOSIS — C787 Secondary malignant neoplasm of liver and intrahepatic bile duct: Secondary | ICD-10-CM | POA: Insufficient documentation

## 2017-06-01 DIAGNOSIS — R531 Weakness: Secondary | ICD-10-CM | POA: Diagnosis not present

## 2017-06-01 DIAGNOSIS — R109 Unspecified abdominal pain: Secondary | ICD-10-CM | POA: Insufficient documentation

## 2017-06-01 DIAGNOSIS — E871 Hypo-osmolality and hyponatremia: Secondary | ICD-10-CM | POA: Insufficient documentation

## 2017-06-01 DIAGNOSIS — R748 Abnormal levels of other serum enzymes: Secondary | ICD-10-CM | POA: Diagnosis not present

## 2017-06-01 DIAGNOSIS — C259 Malignant neoplasm of pancreas, unspecified: Secondary | ICD-10-CM

## 2017-06-01 LAB — COMPREHENSIVE METABOLIC PANEL
ALT: 304 U/L — ABNORMAL HIGH (ref 17–63)
ANION GAP: 9 (ref 5–15)
AST: 193 U/L — ABNORMAL HIGH (ref 15–41)
Albumin: 3.2 g/dL — ABNORMAL LOW (ref 3.5–5.0)
Alkaline Phosphatase: 1113 U/L — ABNORMAL HIGH (ref 38–126)
BUN: 7 mg/dL (ref 6–20)
CALCIUM: 8.9 mg/dL (ref 8.9–10.3)
CHLORIDE: 89 mmol/L — AB (ref 101–111)
CO2: 28 mmol/L (ref 22–32)
Creatinine, Ser: 0.54 mg/dL — ABNORMAL LOW (ref 0.61–1.24)
GFR calc non Af Amer: >60 mL/min (ref >60)
Glucose, Bld: 119 mg/dL — ABNORMAL HIGH (ref 65–99)
Potassium: 3.7 mmol/L (ref 3.5–5.1)
SODIUM: 126 mmol/L — AB (ref 135–145)
Total Bilirubin: 3 mg/dL — ABNORMAL HIGH (ref 0.3–1.2)
Total Protein: 6.8 g/dL (ref 6.5–8.1)

## 2017-06-01 LAB — CBC WITH DIFFERENTIAL/PLATELET
BASOS PCT: 1 %
Basophils Absolute: 0.1 10*3/uL (ref 0–0.1)
Eosinophils Absolute: 0.2 10*3/uL (ref 0–0.7)
Eosinophils Relative: 2 %
HEMATOCRIT: 40.5 % (ref 40.0–52.0)
Hemoglobin: 14 g/dL (ref 13.0–18.0)
LYMPHS ABS: 1.3 10*3/uL (ref 1.0–3.6)
Lymphocytes Relative: 19 %
MCH: 32.1 pg (ref 26.0–34.0)
MCHC: 34.7 g/dL (ref 32.0–36.0)
MCV: 92.6 fL (ref 80.0–100.0)
MONOS PCT: 20 %
Monocytes Absolute: 1.4 10*3/uL — ABNORMAL HIGH (ref 0.2–1.0)
NEUTROS PCT: 58 %
Neutro Abs: 3.9 10*3/uL (ref 1.4–6.5)
Platelets: 431 10*3/uL (ref 150–440)
RBC: 4.37 MIL/uL — AB (ref 4.40–5.90)
RDW: 15 % — ABNORMAL HIGH (ref 11.5–14.5)
WBC: 6.9 10*3/uL (ref 3.8–10.6)

## 2017-06-01 NOTE — Progress Notes (Signed)
Here for follow up. C/o continued mid abd pain,very little appetite.

## 2017-06-02 LAB — CANCER ANTIGEN 19-9: CA 19-9: 179 U/mL — ABNORMAL HIGH (ref 0–35)

## 2017-06-03 ENCOUNTER — Ambulatory Visit
Admission: RE | Admit: 2017-06-03 | Discharge: 2017-06-03 | Disposition: A | Discharge status: Home / Self Care | Payer: Medicaid Other | Source: Ambulatory Visit | Attending: Oncology | Admitting: Oncology

## 2017-06-03 DIAGNOSIS — C259 Malignant neoplasm of pancreas, unspecified: Secondary | ICD-10-CM

## 2017-06-06 ENCOUNTER — Other Ambulatory Visit: Payer: Self-pay | Admitting: *Deleted

## 2017-06-06 DIAGNOSIS — C259 Malignant neoplasm of pancreas, unspecified: Secondary | ICD-10-CM

## 2017-06-09 ENCOUNTER — Ambulatory Visit
Admission: RE | Admit: 2017-06-09 | Discharge: 2017-06-09 | Disposition: A | Discharge status: Home / Self Care | Payer: Medicaid Other | Source: Ambulatory Visit | Attending: Oncology | Admitting: Oncology

## 2017-06-09 DIAGNOSIS — C259 Malignant neoplasm of pancreas, unspecified: Secondary | ICD-10-CM | POA: Insufficient documentation

## 2017-06-09 MED ORDER — IOPAMIDOL (ISOVUE-300) INJECTION 61%
100.0000 mL | Freq: Once | INTRAVENOUS | Status: AC | PRN
  Administered 2017-06-09: 100 mL via INTRAVENOUS

## 2017-06-10 ENCOUNTER — Telehealth: Payer: Self-pay

## 2017-06-10 ENCOUNTER — Other Ambulatory Visit: Payer: Self-pay | Admitting: Oncology

## 2017-06-10 ENCOUNTER — Telehealth: Payer: Self-pay | Admitting: *Deleted

## 2017-06-10 MED ORDER — OXYCODONE HCL 10 MG PO TABS: 10.0000 mg | ORAL_TABLET | Freq: Two times a day (BID) | ORAL | 0 refills | Status: DC | PRN

## 2017-06-10 MED ORDER — ALBUTEROL SULFATE HFA 108 (90 BASE) MCG/ACT IN AERS: 1.0000 | INHALATION_SPRAY | Freq: Four times a day (QID) | RESPIRATORY_TRACT | 6 refills | Status: DC | PRN

## 2017-06-10 NOTE — Telephone Encounter (Signed)
Patient called asking for inhalers refill and for something for pain at his "stomach tube" site.Please advise

## 2017-06-10 NOTE — Telephone Encounter (Signed)
Proventil and oxycodone have been escribed to [facility].

## 2017-06-10 NOTE — Telephone Encounter (Signed)
PA was done over the phone for Ventolin 90 mcg 1-2 puff daily # (18) ( PA # [id number] Ref # I - [id number]). Due to the Oxycodone (opi) forms was printed and faxed to [phone] (waiting for approval) Oxycodone 10 mg 1 tab po BID (# 60)   Patient was contacted and inform of the PA. The patient expressed understanding

## 2017-06-10 NOTE — Telephone Encounter (Signed)
Patient informed of prescriptions sent

## 2017-06-21 ENCOUNTER — Ambulatory Visit: Payer: Medicaid Other | Admitting: Gastroenterology

## 2017-06-21 ENCOUNTER — Encounter: Payer: Self-pay | Admitting: Gastroenterology

## 2017-06-21 ENCOUNTER — Ambulatory Visit: Payer: Self-pay | Admitting: Gastroenterology

## 2017-06-21 ENCOUNTER — Other Ambulatory Visit: Payer: Self-pay

## 2017-06-21 VITALS — BP 115/79 | HR 102 | Ht 72.0 in | Wt 117.6 lb

## 2017-06-21 DIAGNOSIS — C250 Malignant neoplasm of head of pancreas: Secondary | ICD-10-CM

## 2017-06-21 NOTE — Progress Notes (Signed)
Scheduling patient for ERCP with Dr. Allen Norris. Patient does not want to see other doctors for procedure.   Gave preparation documentation.

## 2017-06-21 NOTE — Progress Notes (Signed)
Jonathon Bellows MD, MRCP(U.K) [address]  [place]  [place],  [address]  Main: [phone]  Fax: [phone]   Gastroenterology Consultation  Referring Provider:     Lloyd Huger, MD Primary Care Physician:  Patient, [name] Primary Gastroenterologist:  Dr. Jonathon Bellows  Reason for Consultation:     Pancreatic Cancer         HPI:   Benjamin Diaz. is a 57 y.o. y/o male with stage 4 pancreatic cancer , covered metal stent placed 08/2016 .   Presently referred back to GI for repeat ERCP. T bilirubin has been elevated at 3.0 , he does have hepatic metastasis but Ct scan from 2 weeks back shows possible blockage of the stent. Dr Allen Norris is away on vacation. Yellow skin last 2 weeks.  Denies any pain . Not on any blood thinners.  Past Medical History:  Diagnosis Date  . Arthritis   . Asthma   . Atrial fibrillation ([place])   . Cancer ([place])   . Closed left ankle fracture age 54  . COPD (chronic obstructive pulmonary disease) ([place])   . Dysrhythmia   . Pneumonia 2011  . Right shoulder injury 09/27/2016  . Sciatica    left leg pinched nerve numb at times about once a year    Past Surgical History:  Procedure Laterality Date  . BILIARY STENT PLACEMENT N/A 09/01/2016   Procedure: BILIARY STENT PLACEMENT;  Surgeon: [name], MD;  [facility];  Service: Endoscopy;  Laterality: N/A;  . DIAGNOSTIC LAPAROSCOPIC LIVER BIOPSY  09/16/2016   Procedure: DIAGNOSTIC LAPAROSCOPIC LIVER BIOPSY;  Surgeon: [name], MD;  Location: [place] [facility];  Service: General;;  . ERCP N/A 07/09/2016   Procedure: ENDOSCOPIC RETROGRADE CHOLANGIOPANCREATOGRAPHY (ERCP);  Surgeon: [name], MD;  Location: [facility];  Service: Endoscopy;  Laterality: N/A;  . ERCP N/A 09/01/2016   Procedure: ENDOSCOPIC RETROGRADE CHOLANGIOPANCREATOGRAPHY (ERCP);  Surgeon: [name], MD;  Location: [facility];  Service: Endoscopy;  Laterality: N/A;  . ESOPHAGOGASTRODUODENOSCOPY (EGD) WITH  PROPOFOL N/A 09/01/2016   Procedure: ESOPHAGOGASTRODUODENOSCOPY (EGD) WITH PROPOFOL;  Surgeon: [name], MD;  [facility];  Service: Gastroenterology;  Laterality: N/A;  . EUS N/A 09/01/2016   Procedure: ESOPHAGEAL ENDOSCOPIC ULTRASOUND (EUS) RADIAL;  Surgeon: [name], MD;  [facility];  Service: Endoscopy;  Laterality: N/A;  . FINE NEEDLE ASPIRATION N/A 09/01/2016   Procedure: FINE NEEDLE ASPIRATION (FNA) RADIAL;  Surgeon: [name], MD;  [facility];  Service: Endoscopy;  Laterality: N/A;  . FOREIGN BODY REMOVAL N/A 09/01/2016   Procedure: FOREIGN BODY REMOVAL;  Surgeon: [name], MD;  [facility];  Service: Gastroenterology;  Laterality: N/A;  . KNEE SURGERY Right 2002   arthroscopy  . SHOULDER SURGERY Right 2002    Prior to Admission medications   Medication Sig Start Date End Date Taking? Authorizing Provider  albuterol (PROVENTIL HFA;VENTOLIN HFA) 108 (90 Base) MCG/ACT inhaler Inhale 1-2 puffs into the lungs every 6 (six) hours as needed for wheezing or shortness of breath. 06/10/17   Lloyd Huger, MD  Ipratropium-Albuterol (COMBIVENT RESPIMAT) 20-100 MCG/ACT AERS respimat Inhale 1 puff into the lungs 4 (four) times daily.    [provider]  Oxycodone HCl 10 MG TABS Take 1 tablet (10 mg total) by mouth 2 (two) times daily as needed. 06/10/17   Lloyd Huger, MD    Family History  Problem Relation Age of Onset  . Heart disease Father  Social History   Tobacco Use  . Smoking status: Current Every Day Smoker    Packs/day: 0.50    Years: 40.00    Pack years: 20.00    Types: Cigarettes  . Smokeless tobacco: Never Used  Substance Use Topics  . Alcohol use: Yes    Alcohol/week: 4.2 oz    Types: 7 Cans of beer per week    Comment: last drink 5 weeeks ago  april 2018  . Drug use: No    Allergies as of 06/21/2017 - Review Complete 06/09/2017  Allergen Reaction Noted  . No known allergies   09/15/2016    Review of Systems:    All systems reviewed and negative except where noted in HPI.   Physical Exam:  There were no vitals taken for this visit. No LMP for male patient. Psych:  Alert and cooperative. Normal mood and affect. General:   Alert,  Well-developed, well-nourished, pleasant and cooperative in NAD Head:  Normocephalic and atraumatic. Eyes:  Icterus +++ Ears:  Normal auditory acuity. Nose:  No deformity, discharge, or lesions. Mouth:  No deformity or lesions,oropharynx pink & moist. Neck:  Supple; no masses or thyromegaly. Lungs:  Respirations even and unlabored.  Clear throughout to auscultation.   No wheezes, crackles, or rhonchi. No acute distress. Heart:  Regular rate and rhythm; no murmurs, clicks, rubs, or gallops. Abdomen:  Normal bowel sounds.  No bruits.  Soft, non-tender and non-distended without masses, hepatosplenomegaly or hernias noted.  No guarding or rebound tenderness.    Neurologic:  Alert and oriented x3;  grossly normal neurologically. Skin:  Intact without significant lesions or rashes. No jaundice. Lymph Nodes:  No significant cervical adenopathy. Psych:  Alert and cooperative. Normal mood and affect.  Imaging Studies: Ct Abdomen Pelvis W Contrast  Result Date: 06/09/2017 CLINICAL DATA:  Stage IV pancreatic cancer diagnosed in June. Treated with chemotherapy. Decreased appetite. EXAM: CT ABDOMEN AND PELVIS WITH CONTRAST TECHNIQUE: Multidetector CT imaging of the abdomen and pelvis was performed using the standard protocol following bolus administration of intravenous contrast. CONTRAST:  110mL ISOVUE-300 IOPAMIDOL (ISOVUE-300) INJECTION 61% COMPARISON:  04/12/2017 PET.  01/03/2017 CT. FINDINGS: Lower chest: Emphysema. Mild mucoid impaction within the left lower lobe medially is likely post infectious or inflammatory and was present on the prior. Normal heart size without pericardial or pleural effusion. Tiny hiatal hernia. Hepatobiliary: No  well-defined focal liver lesion. Moderate to marked intrahepatic biliary duct dilatation. The central common hepatic duct measures 1.3 cm on image 20/2 versus 1.0 cm at the same level on 01/03/2017. In the porta hepatis, the common duct measures 1.6 cm on image 24/2 versus 1.3 cm at the same level on the prior (when remeasured). Decreased pneumobilia. The stent is similar in position, originating in the common duct just at the level of the superior aspect of the pancreas and terminating in the descending duodenum. Pancreas: Borderline pancreatic duct dilatation within the body. An area of presumed side branch duct ectasia within the tail is similar at 9 mm on image 23/2. Borderline pancreatic duct dilatation is followed to the level of the head and uncinate process. Posterior pancreatic head hypoattenuating area measures 2.1 x 1.5 cm on image 25/2 and is felt to be similar to 2.2 x 1.7 cm on the prior. Spleen: Normal, without mass or ductal dilatation. Adrenals/Urinary Tract: Normal adrenal glands. Normal kidneys, without hydronephrosis. Decompressed urinary bladder. Stomach/Bowel: Normal remainder of the stomach. Scattered colonic diverticula. Normal small bowel. Vascular/Lymphatic: Advanced aortic and branch vessel atherosclerosis.  Portal and splenic veins are patent. Portal caval nodes of up to 1.3 cm on image 21/2. Upper normal and similar. No pelvic sidewall adenopathy. Reproductive: Normal prostate. Other: No significant free fluid. No evidence of omental or peritoneal disease. Musculoskeletal: Disc bulges at L4-5 and L5-S1. IMPRESSION: 1. Relatively similar size of pancreatic head mass, which is difficult to delineate secondary to technique and presence of common duct stent. 2. Increase in biliary duct dilatation. Although presence of pneumobilia argues against high-grade duct stent obstruction, a component of either stent malfunction or developing obstruction at the proximal portion of the stent cannot be  excluded. 3. Upper normal portal caval nodes are similar to on the prior exam and indeterminate. Recommend attention on follow-up. 4. No evidence of focal hepatic metastasis. 5.  Aortic Atherosclerosis (ICD10-I70.0). Electronically Signed   By: Abigail Miyamoto M.D.   On: 06/09/2017 12:07    Assessment and Plan:   Rutilio Yellowhair. is a 56 y.o. y/o male with stage 4 pancreatic cancer, S/p biliary stent placed ib 08/2016- now referred back since his T bilirubin has gone up to 3.0 and CT scan of the abdomen concerning for bloackage of stent .   Plan  1. ERCP Friday with Dr Allen Norris. I will have his chart reviewed by Dr Allen Norris before the procedure to ensure that the prior stent can be taken out and a new one placed.    I have discussed alternative options, risks & benefits,  which include, but are not limited to, bleeding, infection, perforation,respiratory complication & drug reaction.  The patient agrees with this plan & written consent will be obtained.     Follow up PRN  Dr Jonathon Bellows MD,MRCP(U.K)

## 2017-06-24 ENCOUNTER — Encounter: Payer: Self-pay | Admitting: Certified Registered Nurse Anesthetist

## 2017-06-24 ENCOUNTER — Ambulatory Visit: Admit: 2017-06-24 | Payer: Medicaid Other | Admitting: Gastroenterology

## 2017-06-24 ENCOUNTER — Encounter
Admission: RE | Disposition: A | Discharge status: Home / Self Care | Payer: Self-pay | Source: Ambulatory Visit | Attending: Gastroenterology

## 2017-06-24 ENCOUNTER — Ambulatory Visit
Admission: RE | Admit: 2017-06-24 | Discharge: 2017-06-24 | Disposition: A | Discharge status: Home / Self Care | Payer: Medicaid Other | Source: Ambulatory Visit | Attending: Gastroenterology | Admitting: Gastroenterology

## 2017-06-24 DIAGNOSIS — C250 Malignant neoplasm of head of pancreas: Secondary | ICD-10-CM | POA: Insufficient documentation

## 2017-06-24 DIAGNOSIS — Z538 Procedure and treatment not carried out for other reasons: Secondary | ICD-10-CM | POA: Insufficient documentation

## 2017-06-24 SURGERY — ENDOSCOPIC RETROGRADE CHOLANGIOPANCREATOGRAPHY (ERCP) WITH PROPOFOL
Anesthesia: Choice

## 2017-06-24 SURGERY — ERCP, WITH INTERVENTION IF INDICATED
Anesthesia: General

## 2017-06-24 NOTE — Clinical Social Work Note (Signed)
CSW contacted by Endo Unit this afternoon asking for assistance with patient as patient had arrived for his planned ERCP but stated that he has no ride home and does not have anyone that can stay with him 24 hours after procedure (as per Endo policy). Dr. Ronelle Nigh stated patient would have to reschedule his procedure. Dr. Allen Norris was no longer available to conduct procedure today. CSW spoke with patient's nurse and provided information for DSS Medicaid transportation to get approval for [employer] to provide transportation in the future. Patient is jaundice and has stage 4 pancreatic cancer. Patient has a shunt that is blocked and was to have the ERCP today. Patient's procedure had to be cancelled. CSW spoke with patient and patient had paid $20 to get here for the procedure by taxi. CSW approved to get patient back home with a taxi voucher today. Magda Paganini in Endo was able to speak with Dr. Allen Norris and get patient's procedure rescheduled for next Tuesday. CSW worked with Elease Etienne, [place], and arranged for the [facility] to pick patient up next Tuesday at 10:30, bring patient to cancer center lobby and from there the volunteers will take him to registration then to Endo. Once patient has his procedure, the volunteers will be called to take patient back to cancer center lobby and the cancer center Lucianne Lei will transport patient home where he states he will have someone there to stay with him for 24 hours. Patient, although very frustrated about not being able to have the procedure today, was very appreciative for CSW assistance in coordinating for his procedure next Tuesday. Shela Leff MSW,LCSW [phone]

## 2017-06-27 ENCOUNTER — Encounter: Payer: Self-pay | Admitting: Emergency Medicine

## 2017-06-28 ENCOUNTER — Ambulatory Visit: Payer: Medicaid Other | Admitting: Anesthesiology

## 2017-06-28 ENCOUNTER — Encounter
Admission: RE | Disposition: A | Discharge status: Home / Self Care | Payer: Self-pay | Source: Ambulatory Visit | Attending: Gastroenterology

## 2017-06-28 ENCOUNTER — Encounter: Payer: Self-pay | Admitting: *Deleted

## 2017-06-28 ENCOUNTER — Ambulatory Visit: Payer: Medicaid Other

## 2017-06-28 ENCOUNTER — Other Ambulatory Visit: Payer: Self-pay

## 2017-06-28 ENCOUNTER — Ambulatory Visit
Admission: RE | Admit: 2017-06-28 | Discharge: 2017-06-28 | Disposition: A | Discharge status: Home / Self Care | Payer: Medicaid Other | Source: Ambulatory Visit | Attending: Gastroenterology | Admitting: Gastroenterology

## 2017-06-28 DIAGNOSIS — Z7951 Long term (current) use of inhaled steroids: Secondary | ICD-10-CM | POA: Insufficient documentation

## 2017-06-28 DIAGNOSIS — I4891 Unspecified atrial fibrillation: Secondary | ICD-10-CM | POA: Insufficient documentation

## 2017-06-28 DIAGNOSIS — C250 Malignant neoplasm of head of pancreas: Secondary | ICD-10-CM | POA: Insufficient documentation

## 2017-06-28 DIAGNOSIS — I499 Cardiac arrhythmia, unspecified: Secondary | ICD-10-CM | POA: Diagnosis not present

## 2017-06-28 DIAGNOSIS — Z79899 Other long term (current) drug therapy: Secondary | ICD-10-CM | POA: Insufficient documentation

## 2017-06-28 DIAGNOSIS — J45909 Unspecified asthma, uncomplicated: Secondary | ICD-10-CM | POA: Diagnosis not present

## 2017-06-28 DIAGNOSIS — F17210 Nicotine dependence, cigarettes, uncomplicated: Secondary | ICD-10-CM | POA: Insufficient documentation

## 2017-06-28 DIAGNOSIS — Z8249 Family history of ischemic heart disease and other diseases of the circulatory system: Secondary | ICD-10-CM | POA: Diagnosis not present

## 2017-06-28 DIAGNOSIS — Z4659 Encounter for fitting and adjustment of other gastrointestinal appliance and device: Secondary | ICD-10-CM

## 2017-06-28 DIAGNOSIS — R17 Unspecified jaundice: Secondary | ICD-10-CM

## 2017-06-28 DIAGNOSIS — J449 Chronic obstructive pulmonary disease, unspecified: Secondary | ICD-10-CM | POA: Diagnosis not present

## 2017-06-28 DIAGNOSIS — M5432 Sciatica, left side: Secondary | ICD-10-CM | POA: Insufficient documentation

## 2017-06-28 DIAGNOSIS — M1990 Unspecified osteoarthritis, unspecified site: Secondary | ICD-10-CM | POA: Insufficient documentation

## 2017-06-28 HISTORY — PX: ERCP: SHX5425

## 2017-06-28 SURGERY — ERCP, WITH INTERVENTION IF INDICATED
Anesthesia: General

## 2017-06-28 MED ORDER — SODIUM CHLORIDE 0.9 % IV SOLN
INTRAVENOUS | Status: DC
  Administered 2017-06-28: 11:00:00 via INTRAVENOUS

## 2017-06-28 MED ORDER — PROPOFOL 10 MG/ML IV BOLUS
INTRAVENOUS | Status: DC | PRN
  Administered 2017-06-28 (×2): 20 mg via INTRAVENOUS

## 2017-06-28 MED ORDER — MIDAZOLAM HCL 2 MG/2ML IJ SOLN
INTRAMUSCULAR | Status: DC | PRN
  Administered 2017-06-28: 2 mg via INTRAVENOUS

## 2017-06-28 MED ORDER — LACTATED RINGERS IV SOLN
INTRAVENOUS | Status: DC | PRN
  Administered 2017-06-28: 12:00:00 via INTRAVENOUS

## 2017-06-28 MED ORDER — PROPOFOL 500 MG/50ML IV EMUL
INTRAVENOUS | Status: AC
  Filled 2017-06-28: qty 50

## 2017-06-28 MED ORDER — FENTANYL CITRATE (PF) 100 MCG/2ML IJ SOLN
INTRAMUSCULAR | Status: DC | PRN
  Administered 2017-06-28 (×3): 25 ug via INTRAVENOUS

## 2017-06-28 MED ORDER — FENTANYL CITRATE (PF) 100 MCG/2ML IJ SOLN
INTRAMUSCULAR | Status: AC
  Filled 2017-06-28: qty 2

## 2017-06-28 MED ORDER — IPRATROPIUM-ALBUTEROL 0.5-2.5 (3) MG/3ML IN SOLN
RESPIRATORY_TRACT | Status: AC
  Administered 2017-06-28: 3 mL
  Filled 2017-06-28: qty 3

## 2017-06-28 MED ORDER — MIDAZOLAM HCL 2 MG/2ML IJ SOLN
INTRAMUSCULAR | Status: AC
  Filled 2017-06-28: qty 2

## 2017-06-28 MED ORDER — GLYCOPYRROLATE 0.2 MG/ML IJ SOLN
INTRAMUSCULAR | Status: DC | PRN
  Administered 2017-06-28: 0.1 mg via INTRAVENOUS

## 2017-06-28 MED ORDER — PROPOFOL 500 MG/50ML IV EMUL
INTRAVENOUS | Status: DC | PRN
  Administered 2017-06-28: 120 ug/kg/min via INTRAVENOUS

## 2017-06-28 NOTE — H&P (Signed)
[name], MD [address] [place], [place] [address] Phone:[phone] Fax : [phone]  Primary Care Physician:  Patient, [name] Primary Gastroenterologist:  Dr. Allen Norris  Pre-Procedure History & Physical: HPI:  Benjamin Stan. is a 57 y.o. male is here for an ERCP.   Past Medical History:  Diagnosis Date  . Arthritis   . Asthma   . Atrial fibrillation ([place])   . Cancer ([place])   . Closed left ankle fracture age 61  . COPD (chronic obstructive pulmonary disease) ([place])   . Dysrhythmia   . Pneumonia 2011  . Right shoulder injury 09/27/2016  . Sciatica    left leg pinched nerve numb at times about once a year    Past Surgical History:  Procedure Laterality Date  . BILIARY STENT PLACEMENT N/A 09/01/2016   Procedure: BILIARY STENT PLACEMENT;  Surgeon: [name], MD;  [facility];  Service: Endoscopy;  Laterality: N/A;  . DIAGNOSTIC LAPAROSCOPIC LIVER BIOPSY  09/16/2016   Procedure: DIAGNOSTIC LAPAROSCOPIC LIVER BIOPSY;  Surgeon: [name], MD;  Location: [place];  Service: General;;  . ERCP N/A 07/09/2016   Procedure: ENDOSCOPIC RETROGRADE CHOLANGIOPANCREATOGRAPHY (ERCP);  Surgeon: [name], MD;  Location: [facility];  Service: Endoscopy;  Laterality: N/A;  . ERCP N/A 09/01/2016   Procedure: ENDOSCOPIC RETROGRADE CHOLANGIOPANCREATOGRAPHY (ERCP);  Surgeon: [name], MD;  Location: [facility];  Service: Endoscopy;  Laterality: N/A;  . ESOPHAGOGASTRODUODENOSCOPY (EGD) WITH PROPOFOL N/A 09/01/2016   Procedure: ESOPHAGOGASTRODUODENOSCOPY (EGD) WITH PROPOFOL;  Surgeon: [name], MD;  [facility];  Service: Gastroenterology;  Laterality: N/A;  . EUS N/A 09/01/2016   Procedure: ESOPHAGEAL ENDOSCOPIC ULTRASOUND (EUS) RADIAL;  Surgeon: [name], MD;  [facility];  Service: Endoscopy;  Laterality: N/A;  . FINE NEEDLE ASPIRATION N/A 09/01/2016   Procedure: FINE NEEDLE ASPIRATION (FNA) RADIAL;  Surgeon: [name], MD;  [facility];  Service: Endoscopy;  Laterality: N/A;  . FOREIGN BODY REMOVAL N/A 09/01/2016   Procedure: FOREIGN BODY REMOVAL;  Surgeon: [name], MD;  [facility];  Service: Gastroenterology;  Laterality: N/A;  . KNEE SURGERY Right 2002   arthroscopy  . SHOULDER SURGERY Right 2002    Prior to Admission medications   Medication Sig Start Date End Date Taking? Authorizing Provider  albuterol (PROVENTIL HFA;VENTOLIN HFA) 108 (90 Base) MCG/ACT inhaler Inhale 1-2 puffs into the lungs every 6 (six) hours as needed for wheezing or shortness of breath. 06/10/17  Yes Lloyd Huger, MD  Ipratropium-Albuterol (COMBIVENT RESPIMAT) 20-100 MCG/ACT AERS respimat Inhale 1 puff into the lungs 4 (four) times daily.   Yes [provider]  Oxycodone HCl 10 MG TABS Take 1 tablet (10 mg total) by mouth 2 (two) times daily as needed. 06/10/17  Yes Lloyd Huger, MD    Allergies as of 06/24/2017 - Review Complete 06/21/2017  Allergen Reaction Noted  . No known allergies  09/15/2016    Family History  Problem Relation Age of Onset  . Heart disease Father     Social History   Socioeconomic History  . Marital status: Single    Spouse name: Not on file  . Number of children: Not on file  . Years of education: Not on file  . Highest education level: Not on file  Occupational History  . Not on file  Social Needs  . Financial resource strain: Not on file  . Food insecurity:    Worry: Not on file  Inability: Not on file  . Transportation needs:    Medical: Not on file    Non-medical: Not on file  Tobacco Use  . Smoking status: Current Every Day Smoker    Packs/day: 1.00    Years: 40.00    Pack years: 40.00    Types: Cigarettes  . Smokeless tobacco: Never Used  Substance and Sexual Activity  . Alcohol use: Yes    Alcohol/week: 4.2 oz    Types: 7 Cans of beer per week    Comment: last drink 5 weeeks ago  april 2018  . Drug use: No  .  Sexual activity: Not Currently  Lifestyle  . Physical activity:    Days per week: Not on file    Minutes per session: Not on file  . Stress: Not on file  Relationships  . Social connections:    Talks on phone: Not on file    Gets together: Not on file    Attends religious service: Not on file    Active member of club or organization: Not on file    Attends meetings of clubs or organizations: Not on file    Relationship status: Not on file  . Intimate partner violence:    Fear of current or ex partner: Not on file    Emotionally abused: Not on file    Physically abused: Not on file    Forced sexual activity: Not on file  Other Topics Concern  . Not on file  Social History Narrative  . Not on file    Review of Systems: See HPI, otherwise negative ROS  Physical Exam: BP 103/72   Pulse 86   Temp (!) 96.9 F (36.1 C) (Tympanic)   Resp 20   Ht 6' (1.829 m)   Wt (!) 1147 lb (520.3 kg)   SpO2 100%   BMI 155.56 kg/m  General:   Alert,  pleasant and cooperative in NAD Jaundice Head:  Normocephalic and atraumatic. Neck:  Supple; no masses or thyromegaly. Lungs:  Clear throughout to auscultation.    Heart:  Regular rate and rhythm. Abdomen:  Soft, nontender and nondistended. Normal bowel sounds, without guarding, and without rebound.   Neurologic:  Alert and  oriented x4;  grossly normal neurologically.  Impression/Plan: Benjamin Bertin. is here for an ERCP to be performed for blocked stent  Risks, benefits, limitations, and alternatives regarding  ERCP have been reviewed with the patient.  Questions have been answered.  All parties agreeable.   [name], MD  06/28/2017, 11:53 AM

## 2017-06-28 NOTE — Anesthesia Post-op Follow-up Note (Signed)
Anesthesia QCDR form completed.        

## 2017-06-28 NOTE — Transfer of Care (Signed)
Immediate Anesthesia Transfer of Care Note  Patient: Benjamin Diaz.  Procedure(s) Performed: Procedure(s): ENDOSCOPIC RETROGRADE CHOLANGIOPANCREATOGRAPHY (ERCP) (N/A)  Patient Location: PACU  Anesthesia Type:General  Level of Consciousness: sedated  Airway & Oxygen Therapy: Patient Spontanous Breathing and Patient connected to face mask oxygen  Post-op Assessment: Report given to RN and Post -op Vital signs reviewed and stable  Post vital signs: Reviewed and stable  Last Vitals:  Vitals:   06/28/17 1036 06/28/17 1304  BP: 103/72 108/84  Pulse: 86 82  Resp: 20 (!) 27  Temp: (!) 36.1 C   SpO2: 012% 224%    Complications: No apparent anesthesia complications

## 2017-06-28 NOTE — Anesthesia Preprocedure Evaluation (Addendum)
Anesthesia Evaluation  Patient identified by MRN, date of birth, ID band Patient awake    Reviewed: Allergy & Precautions, H&P , NPO status , Patient's Chart, lab work & pertinent test results, reviewed documented beta blocker date and time   Airway Mallampati: II   Neck ROM: full    Dental  (+) Poor Dentition   Pulmonary neg pulmonary ROS, neg shortness of breath, asthma , pneumonia, resolved, COPD,  COPD inhaler, Current Smoker,    Pulmonary exam normal        Cardiovascular Exercise Tolerance: Poor hypertension, On Medications negative cardio ROS Normal cardiovascular exam+ dysrhythmias  Rhythm:regular Rate:Normal     Neuro/Psych  Neuromuscular disease negative neurological ROS  negative psych ROS   GI/Hepatic negative GI ROS, Neg liver ROS,   Endo/Other  negative endocrine ROS  Renal/GU negative Renal ROS  negative genitourinary   Musculoskeletal   Abdominal   Peds  Hematology negative hematology ROS (+)   Anesthesia Other Findings Past Medical History: No date: Arthritis No date: Asthma No date: Atrial fibrillation ([place]) No date: Cancer [facility]) age 57: Closed left ankle fracture No date: COPD (chronic obstructive pulmonary disease) ([place]) No date: Dysrhythmia 2011: Pneumonia 09/27/2016: Right shoulder injury No date: Sciatica     Comment:  left leg pinched nerve numb at times about once a year Past Surgical History: 09/01/2016: BILIARY STENT PLACEMENT; N/A     Comment:  Procedure: BILIARY STENT PLACEMENT;  Surgeon: [name], MD;  [facility];  Service:               Endoscopy;  Laterality: N/A; 09/16/2016: DIAGNOSTIC LAPAROSCOPIC LIVER BIOPSY     Comment:  Procedure: DIAGNOSTIC LAPAROSCOPIC LIVER BIOPSY;                Surgeon: [name], MD;  Location: [facility];  Service:               General;; 07/09/2016: ERCP; N/A     Comment:  Procedure: ENDOSCOPIC RETROGRADE        CHOLANGIOPANCREATOGRAPHY (ERCP);  Surgeon: [name],               MD;  Location: [facility];  Service: Endoscopy;                Laterality: N/A; 09/01/2016: ERCP; N/A     Comment:  Procedure: ENDOSCOPIC RETROGRADE               CHOLANGIOPANCREATOGRAPHY (ERCP);  Surgeon: [name], MD;  Location: [facility];  Service:               Endoscopy;  Laterality: N/A; 09/01/2016: ESOPHAGOGASTRODUODENOSCOPY (EGD) WITH PROPOFOL; N/A     Comment:  Procedure: ESOPHAGOGASTRODUODENOSCOPY (EGD) WITH               PROPOFOL;  Surgeon: [name], MD;  [facility];  Service: Gastroenterology;  Laterality: N/A; 09/01/2016: EUS; N/A     Comment:  Procedure: ESOPHAGEAL ENDOSCOPIC ULTRASOUND (EUS)               RADIAL;  Surgeon: [name], MD;  [facility];  Service: Endoscopy;  Laterality: N/A; 09/01/2016:  FINE NEEDLE ASPIRATION; N/A     Comment:  Procedure: FINE NEEDLE ASPIRATION (FNA) RADIAL;                Surgeon: [name], MD;  [facility];                Service: Endoscopy;  Laterality: N/A; 09/01/2016: FOREIGN BODY REMOVAL; N/A     Comment:  Procedure: FOREIGN BODY REMOVAL;  Surgeon: [name],               MD;  [facility];  Service: Gastroenterology;               Laterality: N/A; 2002: KNEE SURGERY; Right     Comment:  arthroscopy 2002: SHOULDER SURGERY; Right BMI    Body Mass Index:  155.56 kg/m     Reproductive/Obstetrics negative OB ROS                             Anesthesia Physical Anesthesia Plan  ASA: III  Anesthesia Plan: General   Post-op Pain Management:    Induction: Intravenous  PONV Risk Score and Plan:   Airway Management Planned:   Additional Equipment:   Intra-op Plan:   Post-operative Plan:   Informed Consent: I have reviewed the patients History and Physical, chart, labs and discussed the procedure including the risks,  benefits and alternatives for the proposed anesthesia with the patient or authorized representative who has indicated his/her understanding and acceptance.   Dental Advisory Given  Plan Discussed with: CRNA  Anesthesia Plan Comments:        Anesthesia Quick Evaluation

## 2017-06-28 NOTE — Op Note (Signed)
[facility] Gastroenterology Patient Name: Benjamin Diaz Procedure Date: 06/28/2017 12:09 PM MRN: [id number] Account #: [id number] Date of Birth: [birth year] Admit Type: Outpatient Age: 57 Room: [facility] ENDO ROOM 4 Gender: Male Note Status: Finalized Procedure:            ERCP Indications:          Malignant tumor of the head of pancreas, Stent change Providers:            [name] MD, MD Referring MD:         No Local Md, MD (Referring MD) Medicines:            Propofol per Anesthesia Complications:        No immediate complications. Procedure:            Pre-Anesthesia Assessment:                       - Prior to the procedure, a History and Physical was                        performed, and patient medications and allergies were                        reviewed. The patient's tolerance of previous                        anesthesia was also reviewed. The risks and benefits of                        the procedure and the sedation options and risks were                        discussed with the patient. All questions were                        answered, and informed consent was obtained. Prior                        Anticoagulants: The patient has taken no previous                        anticoagulant or antiplatelet agents. ASA Grade                        Assessment: III - A patient with severe systemic                        disease. After reviewing the risks and benefits, the                        patient was deemed in satisfactory condition to undergo                        the procedure.                       After obtaining informed consent, the scope was passed                        under direct vision. Throughout the procedure, the  patient's blood pressure, pulse, and oxygen saturations                        were monitored continuously. The Endoscope was                        introduced through the mouth, and used to inject                     contrast into and used to inject contrast into the bile                        duct. The ERCP was accomplished without difficulty. The                        patient tolerated the procedure well. Findings:      A metal biliary stent was visible on the scout film. An attempt was made       to remove the previously reported covered biliary stent without success       The bile duct was deeply cannulated with the short-nosed traction       sphincterotome. Contrast was injected. I personally interpreted the bile       duct images. There was brisk flow of contrast through the ducts. Image       quality was excellent. Contrast extended to the entire biliary tree. A       wire was passed into the biliary tree. The biliary tree was swept with a       15 mm balloon starting at the above the stent. Debris was swept from the       duct. One 10 Fr by 12 cm plastic stent with a single external flap and a       single internal flap was placed 10 cm into the common bile duct. Bile       flowed through the stent. The stent was in good position. Impression:           - The biliary tree was swept and debris was found.                       - The previous stent was blocked.                       - The metal stent would not be safely removed.                       - One plastic stent was placed into the common bile                        duct through the metal stent.                       - Dispite cleaning the metal duct out there was still                        no drainage above the stent. Recommendation:       - Discharge patient to home.                       - Return to this GI lab for stent exchange at ERCP  in 3                        months. Procedure Code(s):    --- Professional ---                       [phone], Endoscopic retrograde cholangiopancreatography                        (ERCP); with placement of endoscopic stent into biliary                        or pancreatic duct,  including pre- and post-dilation                        and guide wire passage, when performed, including                        sphincterotomy, when performed, each stent                       [id number], Endoscopic retrograde cholangiopancreatography                        (ERCP); with removal of calculi/debris from                        biliary/pancreatic duct(s)                       [id number], Endoscopic catheterization of the biliary ductal                        system, radiological supervision and interpretation Diagnosis Code(s):    --- Professional ---                       C25.0, Malignant neoplasm of head of pancreas                       Z46.59, Encounter for fitting and adjustment of other                        gastrointestinal appliance and device CPT copyright 2017 American Medical Association. All rights reserved. The codes documented in this report are preliminary and upon coder review may  be revised to meet current compliance requirements. [name] MD, MD 06/28/2017 1:13:05 PM This report has been signed electronically. Number of Addenda: 0 Note Initiated On: 06/28/2017 12:09 PM      [facility]

## 2017-06-28 NOTE — Anesthesia Postprocedure Evaluation (Signed)
Anesthesia Post Note  Patient: Beckem Tomberlin.  Procedure(s) Performed: ENDOSCOPIC RETROGRADE CHOLANGIOPANCREATOGRAPHY (ERCP) (N/A )  Patient location during evaluation: Endoscopy Anesthesia Type: General Level of consciousness: awake and alert Pain management: pain level controlled Vital Signs Assessment: post-procedure vital signs reviewed and stable Respiratory status: spontaneous breathing, nonlabored ventilation, respiratory function stable and patient connected to nasal cannula oxygen Cardiovascular status: blood pressure returned to baseline and stable Postop Assessment: no apparent nausea or vomiting Anesthetic complications: no     Last Vitals:  Vitals:   06/28/17 1314 06/28/17 1324  BP:    Pulse:    Resp: 16 16  Temp:    SpO2:      Last Pain:  Vitals:   06/28/17 1334  TempSrc:   PainSc: 0-No pain                 Safire Gordin S

## 2017-06-28 NOTE — Anesthesia Procedure Notes (Cosign Needed Addendum)
Date/Time: 06/28/2017 12:18 PM Performed by: Gunnar Bulla, MD Pre-anesthesia Checklist: Patient being monitored, Timeout performed, Suction available, Emergency Drugs available and Patient identified Oxygen Delivery Method: Simple face mask Preoxygenation: Pre-oxygenation with 100% oxygen Induction Type: IV induction

## 2017-07-01 ENCOUNTER — Encounter: Payer: Self-pay | Admitting: Gastroenterology

## 2017-07-03 NOTE — Progress Notes (Signed)
[place]  [phone] [phone]  ID: Benjamin Diaz. OB: 1960/09/26  MR#: [id number]  GUR#:[id number]  Patient Care Team: Patient, [name] as PCP - General (General Practice) [name], MD as Consulting Physician (Oncology) [name], MD (Inactive) as Consulting Physician (Gastroenterology) [name], MD as Consulting Physician (Gastroenterology)  CHIEF COMPLAINT: Stage IV pancreatic cancer  INTERVAL HISTORY: Patient returns to clinic today for further evaluation and reinitiation of treatment with single agent Abraxane.  This would be considered cycle 9, day 1. He recently had his biliary stent replaced, but continues to have significant abdominal pain.  He is more jaundiced today. He continues to have edema in his right lower extremity which is worse in the evening.  He has no neurologic complaints. He denies any recent fevers or illnesses.  He has a poor appetite and is losing weight. He has no chest pain or shortness of breath. He denies any nausea, vomiting, constipation, or diarrhea. He has no urinary complaints. Patient offers no further specific complaints today.  REVIEW OF SYSTEMS:   Review of Systems  Constitutional: Positive for malaise/fatigue and weight loss. Negative for fever.  Respiratory: Negative.  Negative for cough and shortness of breath.   Cardiovascular: Positive for leg swelling. Negative for chest pain.  Gastrointestinal: Positive for abdominal pain. Negative for diarrhea, nausea and vomiting.  Genitourinary: Negative.  Negative for dysuria, frequency and urgency.  Musculoskeletal: Negative.  Negative for joint pain.  Skin: Negative.  Negative for itching and rash.  Neurological: Positive for weakness. Negative for sensory change and focal weakness.  Psychiatric/Behavioral: Negative.  The patient is not nervous/anxious.     As per HPI. Otherwise, a complete review of systems is negative.  PAST  MEDICAL HISTORY: Past Medical History:  Diagnosis Date  . Arthritis   . Asthma   . Atrial fibrillation ([place])   . Cancer ([place])   . Closed left ankle fracture age 65  . COPD (chronic obstructive pulmonary disease) ([place])   . Dysrhythmia   . Pneumonia 2011  . Right shoulder injury 09/27/2016  . Sciatica    left leg pinched nerve numb at times about once a year    PAST SURGICAL HISTORY: Past Surgical History:  Procedure Laterality Date  . BILIARY STENT PLACEMENT N/A 09/01/2016   Procedure: BILIARY STENT PLACEMENT;  Surgeon: [name], MD;  Location: [facility] ENDOSCOPY;  Service: Endoscopy;  Laterality: N/A;  . DIAGNOSTIC LAPAROSCOPIC LIVER BIOPSY  09/16/2016   Procedure: DIAGNOSTIC LAPAROSCOPIC LIVER BIOPSY;  Surgeon: [name], MD;  Location: [place];  Service: General;;  . ERCP N/A 07/09/2016   Procedure: ENDOSCOPIC RETROGRADE CHOLANGIOPANCREATOGRAPHY (ERCP);  Surgeon: [name], MD;  Location: [facility];  Service: Endoscopy;  Laterality: N/A;  . ERCP N/A 09/01/2016   Procedure: ENDOSCOPIC RETROGRADE CHOLANGIOPANCREATOGRAPHY (ERCP);  Surgeon: [name], MD;  Location: [facility]OS[facility];  Service: Endoscopy;  Laterality: N/A;  . ERCP N/A 06/28/2017   Procedure: ENDOSCOPIC RETROGRADE CHOLANGIOPANCREATOGRAPHY (ERCP);  Surgeon: [name], MD;  Location: [facility] ENDOSCOPY;  Service: Endoscopy;  Laterality: N/A;  . ESOPHAGOGASTRODUODENOSCOPY (EGD) WITH PROPOFOL N/A 09/01/2016   Procedure: ESOPHAGOGASTRODUODENOSCOPY (EGD) WITH PROPOFOL;  Surgeon: [name], MD;  Location: [facility] ENDOSCOPY;  Service: Gastroenterology;  Laterality: N/A;  . EUS N/A 09/01/2016   Procedure: ESOPHAGEAL ENDOSCOPIC ULTRASOUND (EUS) RADIAL;  Surgeon: [name], MD;  Location: [facility] ENDOSCOPY;  Service: Endoscopy;  Laterality: N/A;  . FINE NEEDLE ASPIRATION N/A 09/01/2016   Procedure: FINE NEEDLE ASPIRATION (FNA) RADIAL;  Surgeon: [name], MD;  Location: [facility]OS[facility];  Service: Endoscopy;  Laterality: N/A;  . FOREIGN  BODY REMOVAL N/A 09/01/2016   Procedure: FOREIGN BODY REMOVAL;  Surgeon: [name], MD;  Location: [facility] ENDOSCOPY;  Service: Gastroenterology;  Laterality: N/A;  . KNEE SURGERY Right 2002   arthroscopy  . SHOULDER SURGERY Right 2002    FAMILY HISTORY: Family History  Problem Relation Age of Onset  . Heart disease Father     ADVANCED DIRECTIVES (Y/N):  N  HEALTH MAINTENANCE: Social History   Tobacco Use  . Smoking status: Current Every Day Smoker    Packs/day: 1.00    Years: 40.00    Pack years: 40.00    Types: Cigarettes  . Smokeless tobacco: Never Used  Substance Use Topics  . Alcohol use: Yes    Alcohol/week: 4.2 oz    Types: 7 Cans of beer per week    Comment: last drink 5 weeeks ago  april 2018  . Drug use: No     Colonoscopy:  PAP:  Bone density:  Lipid panel:  Allergies  Allergen Reactions  . No Known Allergies     Current Outpatient Medications  Medication Sig Dispense Refill  . albuterol (PROVENTIL HFA;VENTOLIN HFA) 108 (90 Base) MCG/ACT inhaler Inhale 1-2 puffs into the lungs every 6 (six) hours as needed for wheezing or shortness of breath. 1 Inhaler 6  . Ipratropium-Albuterol (COMBIVENT RESPIMAT) 20-100 MCG/ACT AERS respimat Inhale 1 puff into the lungs 4 (four) times daily.    . Oxycodone HCl 10 MG TABS Take 1 tablet (10 mg total) by mouth 2 (two) times daily as needed. 60 tablet 0  . morphine (MS CONTIN) 15 MG 12 hr tablet Take 1 tablet (15 mg total) by mouth every 12 (twelve) hours. 60 tablet 0   No current facility-administered medications for this visit.     OBJECTIVE: Vitals:   07/06/17 1022  BP: 135/85  Pulse: 66  Resp: 20  Temp: (!) 96.8 F (36 C)     Body mass index is 15.22 kg/m.    ECOG FS:1 - Symptomatic but completely ambulatory  General: Thin, no acute distress. Eyes: Pink conjunctiva, anicteric sclera. Lungs: Clear to auscultation bilaterally. Heart: Regular rate and rhythm. No rubs, murmurs, or gallops. Abdomen: Soft, mildly  tender, normoactive bowel sounds. Musculoskeletal: No edema, cyanosis, or clubbing. Neuro: Alert, answering all questions appropriately. Cranial nerves grossly intact. Skin: No rashes or petechiae noted. Psych: Normal affect.  LAB RESULTS:  Lab Results  Component Value Date   NA 127 (L) 07/06/2017   K 4.4 07/06/2017   CL 93 (L) 07/06/2017   CO2 26 07/06/2017   GLUCOSE 106 (H) 07/06/2017   BUN 12 07/06/2017   CREATININE 0.46 (L) 07/06/2017   CALCIUM 9.2 07/06/2017   PROT 7.4 07/06/2017   ALBUMIN 3.0 (L) 07/06/2017   AST 85 (H) 07/06/2017   ALT 107 (H) 07/06/2017   ALKPHOS 1,119 (H) 07/06/2017   BILITOT 6.8 (H) 07/06/2017   GFRNONAA >60 07/06/2017   GFRAA >60 07/06/2017    Lab Results  Component Value Date   WBC 14.0 (H) 07/06/2017   NEUTROABS 9.3 (H) 07/06/2017   HGB 12.0 (L) 07/06/2017   HCT 35.1 (L) 07/06/2017   MCV 97.0 07/06/2017   PLT 576 (H) 07/06/2017      STUDIES: Ct Abdomen Pelvis W Contrast  Result Date: 07/07/2017 CLINICAL DATA:  Worsening abdominal pain and elevated bilirubin. Malignant neoplasm of the pancreas. EXAM: CT ABDOMEN AND PELVIS WITH  CONTRAST TECHNIQUE: Multidetector CT imaging of the abdomen and pelvis was performed using the standard protocol following bolus administration of intravenous contrast. CONTRAST:  62mL ISOVUE-300 IOPAMIDOL (ISOVUE-300) INJECTION 61% COMPARISON:  06/09/2016. FINDINGS: Lower chest: No pleural effusion. Unchanged left lower lobe nodular density measuring 5 mm, image 1/4. Hepatobiliary: There is minimally improved intrahepatic bile duct dilatation. The central common hepatic duct measures 1.1 cm, image 18/2. Previously 1.3 cm. The common bile duct in the porta hepatis measures 7 mm, image 33/5. Previously 1.7 cm. No pneumobilia noted. Interval placement a second plastic stent which originates in the distal common bile duct and extends into the proximal jejunum. Pancreas: Posterior pancreatic head hypoattenuating mass measures  2.4 by 2.2 cm, image 25/2. Previously 2.1 x 1.5 cm. Borderline main duct dilatation appears similar to the previous study. Small fluid attenuating structure within the tail of pancreas is unchanged measuring 0.8 cm. Spleen: Normal in size without focal abnormality. Adrenals/Urinary Tract: Normal adrenal glands. Stomach/Bowel: Stomach is unremarkable. Contrast opacification proximal small bowel loops noted. These appear increased in caliber measuring up to 4.2 cm, image 53/2. This is compared with 2.5 cm previously. The distal small bowel loops and colon have a normal caliber. Vascular/Lymphatic: Aortic atherosclerosis. No aneurysm. No retroperitoneal adenopathy. No pelvic or inguinal adenopathy. Reproductive: Prostate is unremarkable. Other: No free fluid or fluid collections. No peritoneal nodularity identified. Musculoskeletal: No aggressive lytic or sclerotic bone lesions. IMPRESSION: 1. Interval mild increase in size of low-attenuation hypoattenuating mass within the head of pancreas. 2. Mild interval improvement in biliary dilatation. The central common hepatic duct is decreased in caliber from previous exam. At the porta hepatis the previously noted common bile duct has decreased in caliber from the previous exam. Pneumobilia is no longer visualized however. 3. Abnormal increase caliber of the proximal small bowel loops which measure up to 4.2 cm. Nonspecific cannot rule out partial obstruction versus ileus. Clinical correlation advised. 4. No evidence for liver metastasis 5.  Aortic Atherosclerosis (ICD10-I70.0). Electronically Signed   By: Kerby Moors M.D.   On: 07/07/2017 16:13   Ct Abdomen Pelvis W Contrast  Result Date: 06/09/2017 CLINICAL DATA:  Stage IV pancreatic cancer diagnosed in June. Treated with chemotherapy. Decreased appetite. EXAM: CT ABDOMEN AND PELVIS WITH CONTRAST TECHNIQUE: Multidetector CT imaging of the abdomen and pelvis was performed using the standard protocol following bolus  administration of intravenous contrast. CONTRAST:  166mL ISOVUE-300 IOPAMIDOL (ISOVUE-300) INJECTION 61% COMPARISON:  04/12/2017 PET.  01/03/2017 CT. FINDINGS: Lower chest: Emphysema. Mild mucoid impaction within the left lower lobe medially is likely post infectious or inflammatory and was present on the prior. Normal heart size without pericardial or pleural effusion. Tiny hiatal hernia. Hepatobiliary: No well-defined focal liver lesion. Moderate to marked intrahepatic biliary duct dilatation. The central common hepatic duct measures 1.3 cm on image 20/2 versus 1.0 cm at the same level on 01/03/2017. In the porta hepatis, the common duct measures 1.6 cm on image 24/2 versus 1.3 cm at the same level on the prior (when remeasured). Decreased pneumobilia. The stent is similar in position, originating in the common duct just at the level of the superior aspect of the pancreas and terminating in the descending duodenum. Pancreas: Borderline pancreatic duct dilatation within the body. An area of presumed side branch duct ectasia within the tail is similar at 9 mm on image 23/2. Borderline pancreatic duct dilatation is followed to the level of the head and uncinate process. Posterior pancreatic head hypoattenuating area measures 2.1 x 1.5 cm  on image 25/2 and is felt to be similar to 2.2 x 1.7 cm on the prior. Spleen: Normal, without mass or ductal dilatation. Adrenals/Urinary Tract: Normal adrenal glands. Normal kidneys, without hydronephrosis. Decompressed urinary bladder. Stomach/Bowel: Normal remainder of the stomach. Scattered colonic diverticula. Normal small bowel. Vascular/Lymphatic: Advanced aortic and branch vessel atherosclerosis. Portal and splenic veins are patent. Portal caval nodes of up to 1.3 cm on image 21/2. Upper normal and similar. No pelvic sidewall adenopathy. Reproductive: Normal prostate. Other: No significant free fluid. No evidence of omental or peritoneal disease. Musculoskeletal: Disc bulges  at L4-5 and L5-S1. IMPRESSION: 1. Relatively similar size of pancreatic head mass, which is difficult to delineate secondary to technique and presence of common duct stent. 2. Increase in biliary duct dilatation. Although presence of pneumobilia argues against high-grade duct stent obstruction, a component of either stent malfunction or developing obstruction at the proximal portion of the stent cannot be excluded. 3. Upper normal portal caval nodes are similar to on the prior exam and indeterminate. Recommend attention on follow-up. 4. No evidence of focal hepatic metastasis. 5.  Aortic Atherosclerosis (ICD10-I70.0). Electronically Signed   By: Abigail Miyamoto M.D.   On: 06/09/2017 12:07   Dg C-arm 1-60 Min-no Report  Result Date: 06/28/2017 Fluoroscopy was utilized by the requesting physician.  No radiographic interpretation.    ASSESSMENT: Stage IV pancreatic cancer  PLAN:   1. Stage IV pancreatic cancer: Imaging, pathology, and Op note reviewed independently confirming stage IV disease with multiple peritoneal implants.  PET scan results from April 12, 2017 reviewed independently with near complete resolution of hepatic metastasis and reduction of metabolic activity in the pancreatic head.  CT scan from June 09, 2017 appears to have stable disease.  Despite this, his CA 19 9 continue to trend up and is now 391.  His progressive disease is likely secondary to the fact that he has not received treatment since May 18, 2017.  Although patient had recent stent replacement, cannot proceed with treatment today secondary to significantly elevated bilirubin.  Stat CT performed on July 07, 2017 reviewed independently and report as above with no obvious reason for his elevated bilirubin.  This may be as a result of his recent stent placement and manipulation of his bile duct.  Patient will likely have to switch treatments in the near future to modified FOLFIRINOX (oxaliplatin 85 mg/m, leucovorin 400 mg/m,  irinotecan 150 mg/m, 5-FU 2400 mg/m over 46 hours) every 2 weeks.  This will require a port placement, patient is hesitant to do.  Will continue single agent Abraxane for now.  Return to clinic in 1 week for further evaluation and consideration of cycle 9, day 1 if his elevated bilirubin is resolved. 2. Pain: CT results as above.  Patient was given a prescription for 15 mg MS Contin every 12 hours.  Continue oxycodone 10 mg as needed. 3. Elevated liver enzymes: AST and ALT are improving, but still elevated.  Bilirubin significantly worse.  CT scan as above.  Appreciate GI input.  Patient recently had biliary stent replaced.   4. Hyponatremia: Chronic.  Continue to monitor closely. 5. Rash: Likely secondary to gemcitabine which has been discontinued. 6. Peripheral edema: Continue elevation nightly.  Lower extremity ultrasound did not reveal DVT.   7.  Diarrhea: Resolved. 8.  Elevated bilirubin: CT scan results as above.  Unclear etiology at this time.  Possibly secondary to recent bile duct manipulation.  Patient expressed understanding and was in agreement with this plan.  He also understands that He can call clinic at any time with any questions, concerns, or complaints.   Cancer Staging Pancreatic cancer [facility]) Staging form: Exocrine Pancreas, AJCC 8th Edition - Clinical stage from 09/25/2016: Stage IV (cTX, cNX, pM1) - Signed by Lloyd Huger, MD on 09/25/2016   Lloyd Huger, MD   07/08/2017 12:21 PM

## 2017-07-05 ENCOUNTER — Other Ambulatory Visit: Payer: Self-pay | Admitting: Oncology

## 2017-07-06 ENCOUNTER — Inpatient Hospital Stay: Payer: Medicaid Other | Attending: Oncology

## 2017-07-06 ENCOUNTER — Encounter: Payer: Self-pay | Admitting: Oncology

## 2017-07-06 ENCOUNTER — Inpatient Hospital Stay: Payer: Medicaid Other

## 2017-07-06 ENCOUNTER — Other Ambulatory Visit: Payer: Self-pay

## 2017-07-06 ENCOUNTER — Other Ambulatory Visit: Payer: Self-pay | Admitting: *Deleted

## 2017-07-06 ENCOUNTER — Inpatient Hospital Stay (HOSPITAL_BASED_OUTPATIENT_CLINIC_OR_DEPARTMENT_OTHER): Payer: Medicaid Other | Admitting: Oncology

## 2017-07-06 VITALS — BP 135/85 | HR 66 | Temp 96.8°F | Resp 20 | Wt 112.2 lb

## 2017-07-06 DIAGNOSIS — M7989 Other specified soft tissue disorders: Secondary | ICD-10-CM | POA: Diagnosis not present

## 2017-07-06 DIAGNOSIS — R531 Weakness: Secondary | ICD-10-CM

## 2017-07-06 DIAGNOSIS — R21 Rash and other nonspecific skin eruption: Secondary | ICD-10-CM | POA: Diagnosis not present

## 2017-07-06 DIAGNOSIS — E871 Hypo-osmolality and hyponatremia: Secondary | ICD-10-CM

## 2017-07-06 DIAGNOSIS — R5381 Other malaise: Secondary | ICD-10-CM | POA: Insufficient documentation

## 2017-07-06 DIAGNOSIS — R5383 Other fatigue: Secondary | ICD-10-CM | POA: Insufficient documentation

## 2017-07-06 DIAGNOSIS — R740 Nonspecific elevation of levels of transaminase and lactic acid dehydrogenase [LDH]: Secondary | ICD-10-CM | POA: Insufficient documentation

## 2017-07-06 DIAGNOSIS — R109 Unspecified abdominal pain: Secondary | ICD-10-CM | POA: Insufficient documentation

## 2017-07-06 DIAGNOSIS — R600 Localized edema: Secondary | ICD-10-CM | POA: Insufficient documentation

## 2017-07-06 DIAGNOSIS — R630 Anorexia: Secondary | ICD-10-CM | POA: Insufficient documentation

## 2017-07-06 DIAGNOSIS — R748 Abnormal levels of other serum enzymes: Secondary | ICD-10-CM

## 2017-07-06 DIAGNOSIS — C259 Malignant neoplasm of pancreas, unspecified: Secondary | ICD-10-CM

## 2017-07-06 DIAGNOSIS — R634 Abnormal weight loss: Secondary | ICD-10-CM

## 2017-07-06 DIAGNOSIS — F17210 Nicotine dependence, cigarettes, uncomplicated: Secondary | ICD-10-CM | POA: Diagnosis not present

## 2017-07-06 DIAGNOSIS — C787 Secondary malignant neoplasm of liver and intrahepatic bile duct: Secondary | ICD-10-CM

## 2017-07-06 LAB — CBC WITH DIFFERENTIAL/PLATELET
BASOS ABS: 0.1 10*3/uL (ref 0–0.1)
BASOS PCT: 1 %
EOS ABS: 0.6 10*3/uL (ref 0–0.7)
EOS PCT: 5 %
HCT: 35.1 % — ABNORMAL LOW (ref 40.0–52.0)
Hemoglobin: 12 g/dL — ABNORMAL LOW (ref 13.0–18.0)
LYMPHS PCT: 19 %
Lymphs Abs: 2.6 10*3/uL (ref 1.0–3.6)
MCH: 33.2 pg (ref 26.0–34.0)
MCHC: 34.2 g/dL (ref 32.0–36.0)
MCV: 97 fL (ref 80.0–100.0)
MONO ABS: 1.3 10*3/uL — AB (ref 0.2–1.0)
Monocytes Relative: 9 %
Neutro Abs: 9.3 10*3/uL — ABNORMAL HIGH (ref 1.4–6.5)
Neutrophils Relative %: 66 %
Platelets: 576 10*3/uL — ABNORMAL HIGH (ref 150–440)
RBC: 3.62 MIL/uL — AB (ref 4.40–5.90)
RDW: 17.3 % — AB (ref 11.5–14.5)
WBC: 14 10*3/uL — AB (ref 3.8–10.6)

## 2017-07-06 LAB — COMPREHENSIVE METABOLIC PANEL
ALK PHOS: 1119 U/L — AB (ref 38–126)
ALT: 107 U/L — ABNORMAL HIGH (ref 17–63)
AST: 85 U/L — AB (ref 15–41)
Albumin: 3 g/dL — ABNORMAL LOW (ref 3.5–5.0)
Anion gap: 8 (ref 5–15)
BILIRUBIN TOTAL: 6.8 mg/dL — AB (ref 0.3–1.2)
BUN: 12 mg/dL (ref 6–20)
CALCIUM: 9.2 mg/dL (ref 8.9–10.3)
CO2: 26 mmol/L (ref 22–32)
CREATININE: 0.46 mg/dL — AB (ref 0.61–1.24)
Chloride: 93 mmol/L — ABNORMAL LOW (ref 101–111)
GFR calc Af Amer: >60 mL/min (ref >60)
Glucose, Bld: 106 mg/dL — ABNORMAL HIGH (ref 65–99)
POTASSIUM: 4.4 mmol/L (ref 3.5–5.1)
Sodium: 127 mmol/L — ABNORMAL LOW (ref 135–145)
TOTAL PROTEIN: 7.4 g/dL (ref 6.5–8.1)

## 2017-07-06 MED ORDER — MORPHINE SULFATE ER 15 MG PO TBCR: 15.0000 mg | EXTENDED_RELEASE_TABLET | Freq: Two times a day (BID) | ORAL | 0 refills | Status: DC

## 2017-07-06 NOTE — Progress Notes (Signed)
Patient

## 2017-07-07 ENCOUNTER — Ambulatory Visit
Admission: RE | Admit: 2017-07-07 | Discharge: 2017-07-07 | Disposition: A | Discharge status: Home / Self Care | Payer: Medicaid Other | Source: Ambulatory Visit | Attending: Oncology | Admitting: Oncology

## 2017-07-07 DIAGNOSIS — R933 Abnormal findings on diagnostic imaging of other parts of digestive tract: Secondary | ICD-10-CM | POA: Insufficient documentation

## 2017-07-07 DIAGNOSIS — C259 Malignant neoplasm of pancreas, unspecified: Secondary | ICD-10-CM | POA: Insufficient documentation

## 2017-07-07 DIAGNOSIS — I700 Atherosclerosis of aorta: Secondary | ICD-10-CM | POA: Diagnosis not present

## 2017-07-07 LAB — CANCER ANTIGEN 19-9: CAN 19-9: 391 U/mL — AB (ref 0–35)

## 2017-07-07 MED ORDER — IOPAMIDOL (ISOVUE-300) INJECTION 61%
75.0000 mL | Freq: Once | INTRAVENOUS | Status: AC | PRN
  Administered 2017-07-07: 75 mL via INTRAVENOUS

## 2017-07-10 NOTE — Progress Notes (Signed)
[facility]  Telephone:([phone] Fax:([phone]) [phone]  ID: Benjamin Diaz. OB: Feb 09, 1961  MR#: [id number]  DDU#:[id number]  Patient Care Team: Patient, [name] as PCP - General (General Practice) [name], MD as Consulting Physician (Oncology) [name], MD (Inactive) as Consulting Physician (Gastroenterology) [name], MD as Consulting Physician (Gastroenterology)  CHIEF COMPLAINT: Stage IV pancreatic cancer  INTERVAL HISTORY: Patient returns to clinic today for further evaluation, discussion of his imaging results, and reconsideration of cycle 9, day 1 of single agent Abraxane.  He continues to have abdominal pain, but this has improved with his current narcotic regimen.  He otherwise feels well. He continues to have edema in his right lower extremity which is worse in the evening.  He has no neurologic complaints. He denies any recent fevers or illnesses.  His appetite has improved, but his weight continues to trend down. He has no chest pain or shortness of breath. He denies any nausea, vomiting, constipation, or diarrhea. He has no urinary complaints.  Patient offers no further specific complaints today.  REVIEW OF SYSTEMS:   Review of Systems  Constitutional: Positive for malaise/fatigue and weight loss. Negative for fever.  Respiratory: Negative.  Negative for cough and shortness of breath.   Cardiovascular: Positive for leg swelling. Negative for chest pain.  Gastrointestinal: Positive for abdominal pain. Negative for diarrhea, nausea and vomiting.  Genitourinary: Negative.  Negative for dysuria, frequency and urgency.  Musculoskeletal: Negative.  Negative for joint pain.  Skin: Negative.  Negative for itching and rash.  Neurological: Positive for weakness. Negative for sensory change and focal weakness.  Psychiatric/Behavioral: Negative.  The patient is not nervous/anxious.     As per HPI. Otherwise, a complete review of systems is  negative.  PAST MEDICAL HISTORY: Past Medical History:  Diagnosis Date  . Arthritis   . Asthma   . Atrial fibrillation ([place])   . Cancer ([place])   . Closed left ankle fracture age 68  . COPD (chronic obstructive pulmonary disease) ([place])   . Dysrhythmia   . Pneumonia 2011  . Right shoulder injury 09/27/2016  . Sciatica    left leg pinched nerve numb at times about once a year    PAST SURGICAL HISTORY: Past Surgical History:  Procedure Laterality Date  . BILIARY STENT PLACEMENT N/A 09/01/2016   Procedure: BILIARY STENT PLACEMENT;  Surgeon: [name], MD;  Location: [facility];  Service: Endoscopy;  Laterality: N/A;  . DIAGNOSTIC LAPAROSCOPIC LIVER BIOPSY  09/16/2016   Procedure: DIAGNOSTIC LAPAROSCOPIC LIVER BIOPSY;  Surgeon: [name], MD;  Location: [facility];  Service: General;;  . ERCP N/A 07/09/2016   Procedure: ENDOSCOPIC RETROGRADE CHOLANGIOPANCREATOGRAPHY (ERCP);  Surgeon: [name], MD;  Location: [facility];  Service: Endoscopy;  Laterality: N/A;  . ERCP N/A 09/01/2016   Procedure: ENDOSCOPIC RETROGRADE CHOLANGIOPANCREATOGRAPHY (ERCP);  Surgeon: [name], MD;  Location: [facility];  Service: Endoscopy;  Laterality: N/A;  . ERCP N/A 06/28/2017   Procedure: ENDOSCOPIC RETROGRADE CHOLANGIOPANCREATOGRAPHY (ERCP);  Surgeon: [name], MD;  Location: [facility] ENDOSCOPY;  Service: Endoscopy;  Laterality: N/A;  . ESOPHAGOGASTRODUODENOSCOPY (EGD) WITH PROPOFOL N/A 09/01/2016   Procedure: ESOPHAGOGASTRODUODENOSCOPY (EGD) WITH PROPOFOL;  Surgeon: [name], MD;  [facility];  Service: Gastroenterology;  Laterality: N/A;  . EUS N/A 09/01/2016   Procedure: ESOPHAGEAL ENDOSCOPIC ULTRASOUND (EUS) RADIAL;  Surgeon: [name], MD;  [facility];  Service: Endoscopy;  Laterality: N/A;  . FINE NEEDLE ASPIRATION N/A 09/01/2016   Procedure: FINE NEEDLE ASPIRATION (  FNA) RADIAL;  Surgeon: [name], MD;  [facility];  Service: Endoscopy;   Laterality: N/A;  . FOREIGN BODY REMOVAL N/A 09/01/2016   Procedure: FOREIGN BODY REMOVAL;  Surgeon: [name], MD;  [facility];  Service: Gastroenterology;  Laterality: N/A;  . KNEE SURGERY Right 2002   arthroscopy  . SHOULDER SURGERY Right 2002    FAMILY HISTORY: Family History  Problem Relation Age of Onset  . Heart disease Father     ADVANCED DIRECTIVES (Y/N):  N  HEALTH MAINTENANCE: Social History   Tobacco Use  . Smoking status: Current Every Day Smoker    Packs/day: 1.00    Years: 40.00    Pack years: 40.00    Types: Cigarettes  . Smokeless tobacco: Never Used  Substance Use Topics  . Alcohol use: Yes    Alcohol/week: 4.2 oz    Types: 7 Cans of beer per week    Comment: last drink 5 weeeks ago  april 2018  . Drug use: No     Colonoscopy:  PAP:  Bone density:  Lipid panel:  Allergies  Allergen Reactions  . No Known Allergies     Current Outpatient Medications  Medication Sig Dispense Refill  . albuterol (PROVENTIL HFA;VENTOLIN HFA) 108 (90 Base) MCG/ACT inhaler Inhale 1-2 puffs into the lungs every 6 (six) hours as needed for wheezing or shortness of breath. 1 Inhaler 6  . Ipratropium-Albuterol (COMBIVENT RESPIMAT) 20-100 MCG/ACT AERS respimat Inhale 1 puff into the lungs 4 (four) times daily.    Marland Kitchen morphine (MS CONTIN) 15 MG 12 hr tablet Take 1 tablet (15 mg total) by mouth every 12 (twelve) hours. 60 tablet 0  . Oxycodone HCl 10 MG TABS Take 1 tablet (10 mg total) by mouth 2 (two) times daily as needed. 60 tablet 0   No current facility-administered medications for this visit.     OBJECTIVE: Vitals:   07/13/17 1018 07/13/17 1021  BP:  106/71  Pulse:  80  Resp: 12   Temp:  98.2 F (36.8 C)     Body mass index is 15.11 kg/m.    ECOG FS:1 - Symptomatic but completely ambulatory  General: Thin, no acute distress. Eyes: Pink conjunctiva, anicteric sclera. Lungs: Clear to auscultation bilaterally. Heart: Regular rate and rhythm. No  rubs, murmurs, or gallops. Abdomen: Soft, nontender, nondistended. No organomegaly noted, normoactive bowel sounds. Musculoskeletal: No edema, cyanosis, or clubbing. Neuro: Alert, answering all questions appropriately. Cranial nerves grossly intact. Skin: No rashes or petechiae noted. Psych: Normal affect.  LAB RESULTS:  Lab Results  Component Value Date   NA 129 (L) 07/13/2017   K 4.4 07/13/2017   CL 94 (L) 07/13/2017   CO2 28 07/13/2017   GLUCOSE 106 (H) 07/13/2017   BUN 9 07/13/2017   CREATININE 0.57 (L) 07/13/2017   CALCIUM 9.4 07/13/2017   PROT 7.3 07/13/2017   ALBUMIN 3.4 (L) 07/13/2017   AST 103 (H) 07/13/2017   ALT 122 (H) 07/13/2017   ALKPHOS 1,187 (H) 07/13/2017   BILITOT 4.9 (H) 07/13/2017   GFRNONAA >60 07/13/2017   GFRAA >60 07/13/2017    Lab Results  Component Value Date   WBC 11.4 (H) 07/13/2017   NEUTROABS 7.5 (H) 07/13/2017   HGB 13.8 07/13/2017   HCT 39.7 (L) 07/13/2017   MCV 96.0 07/13/2017   PLT 549 (H) 07/13/2017      STUDIES: Ct Abdomen Pelvis W Contrast  Result Date: 07/07/2017 CLINICAL DATA:  Worsening abdominal pain and elevated bilirubin. Malignant  neoplasm of the pancreas. EXAM: CT ABDOMEN AND PELVIS WITH CONTRAST TECHNIQUE: Multidetector CT imaging of the abdomen and pelvis was performed using the standard protocol following bolus administration of intravenous contrast. CONTRAST:  71mL ISOVUE-300 IOPAMIDOL (ISOVUE-300) INJECTION 61% COMPARISON:  06/09/2016. FINDINGS: Lower chest: No pleural effusion. Unchanged left lower lobe nodular density measuring 5 mm, image 1/4. Hepatobiliary: There is minimally improved intrahepatic bile duct dilatation. The central common hepatic duct measures 1.1 cm, image 18/2. Previously 1.3 cm. The common bile duct in the porta hepatis measures 7 mm, image 33/5. Previously 1.7 cm. No pneumobilia noted. Interval placement a second plastic stent which originates in the distal common bile duct and extends into the  proximal jejunum. Pancreas: Posterior pancreatic head hypoattenuating mass measures 2.4 by 2.2 cm, image 25/2. Previously 2.1 x 1.5 cm. Borderline main duct dilatation appears similar to the previous study. Small fluid attenuating structure within the tail of pancreas is unchanged measuring 0.8 cm. Spleen: Normal in size without focal abnormality. Adrenals/Urinary Tract: Normal adrenal glands. Stomach/Bowel: Stomach is unremarkable. Contrast opacification proximal small bowel loops noted. These appear increased in caliber measuring up to 4.2 cm, image 53/2. This is compared with 2.5 cm previously. The distal small bowel loops and colon have a normal caliber. Vascular/Lymphatic: Aortic atherosclerosis. No aneurysm. No retroperitoneal adenopathy. No pelvic or inguinal adenopathy. Reproductive: Prostate is unremarkable. Other: No free fluid or fluid collections. No peritoneal nodularity identified. Musculoskeletal: No aggressive lytic or sclerotic bone lesions. IMPRESSION: 1. Interval mild increase in size of low-attenuation hypoattenuating mass within the head of pancreas. 2. Mild interval improvement in biliary dilatation. The central common hepatic duct is decreased in caliber from previous exam. At the porta hepatis the previously noted common bile duct has decreased in caliber from the previous exam. Pneumobilia is no longer visualized however. 3. Abnormal increase caliber of the proximal small bowel loops which measure up to 4.2 cm. Nonspecific cannot rule out partial obstruction versus ileus. Clinical correlation advised. 4. No evidence for liver metastasis 5.  Aortic Atherosclerosis (ICD10-I70.0). Electronically Signed   By: Kerby Moors M.D.   On: 07/07/2017 16:13   Dg C-arm 1-60 Min-no Report  Result Date: 06/28/2017 Fluoroscopy was utilized by the requesting physician.  No radiographic interpretation.    ASSESSMENT: Stage IV pancreatic cancer  PLAN:   1. Stage IV pancreatic cancer: Imaging,  pathology, and Op note reviewed independently confirming stage IV disease with multiple peritoneal implants.  CT scan results from July 07, 2017 reviewed independently and reported as above with mild progression of disease, but no obvious etiology of his increased bilirubin. CA-19-9 has trended up and is now 377. His progressive disease is likely secondary to the fact that he has not received treatment since May 18, 2017.  Although patient had recent stent replacement, cannot proceed with treatment today secondary to significantly elevated bilirubin.  Patient will likely have to switch treatments in the near future to modified FOLFIRINOX (oxaliplatin 85 mg/m, leucovorin 400 mg/m, irinotecan 150 mg/m, 5-FU 2400 mg/m over 46 hours) every 2 weeks.  This will require a port placement, which patient is hesitant to do.  Will continue single agent Abraxane for now.  Return to clinic in 1 week for further evaluation and reconsideration of cycle 9, day 1 if his elevated bilirubin is resolved. 2. Pain: CT results as above.  Continue 15 mg MS Contin every 12 hours and oxycodone 10 mg as needed. 3. Elevated liver enzymes: AST and ALT remain elevated, monitor. CT scan  as above.  Appreciate GI input.  Patient recently had biliary stent replaced.   4. Hyponatremia: Chronic.  Sodium is 129 today.  Monitor. 5. Rash: Likely secondary to gemcitabine which has been discontinued. 6. Peripheral edema: Continue elevation nightly.  Lower extremity ultrasound did not reveal DVT.   7.  Diarrhea: Resolved. 8.  Elevated bilirubin: CT scan results as above.  Possibly secondary to recent bile duct manipulation.  Patient's bilirubin continues to improve, but still not decreased enough to proceed with treatment.  Return to clinic in 1 week as above.  Patient expressed understanding and was in agreement with this plan. He also understands that He can call clinic at any time with any questions, concerns, or complaints.   Cancer  Staging Pancreatic cancer [facility]) Staging form: Exocrine Pancreas, AJCC 8th Edition - Clinical stage from 09/25/2016: Stage IV (cTX, cNX, pM1) - Signed by Lloyd Huger, MD on 09/25/2016   Lloyd Huger, MD   07/17/2017 7:59 AM

## 2017-07-12 ENCOUNTER — Other Ambulatory Visit: Payer: Self-pay | Admitting: Oncology

## 2017-07-13 ENCOUNTER — Inpatient Hospital Stay (HOSPITAL_BASED_OUTPATIENT_CLINIC_OR_DEPARTMENT_OTHER): Payer: Medicaid Other | Admitting: Oncology

## 2017-07-13 ENCOUNTER — Inpatient Hospital Stay: Payer: Medicaid Other

## 2017-07-13 ENCOUNTER — Encounter: Payer: Self-pay | Admitting: Oncology

## 2017-07-13 ENCOUNTER — Other Ambulatory Visit: Payer: Self-pay

## 2017-07-13 VITALS — BP 106/71 | HR 80 | Temp 98.2°F | Resp 12 | Ht 72.0 in | Wt 111.4 lb

## 2017-07-13 DIAGNOSIS — R21 Rash and other nonspecific skin eruption: Secondary | ICD-10-CM

## 2017-07-13 DIAGNOSIS — R748 Abnormal levels of other serum enzymes: Secondary | ICD-10-CM

## 2017-07-13 DIAGNOSIS — E871 Hypo-osmolality and hyponatremia: Secondary | ICD-10-CM

## 2017-07-13 DIAGNOSIS — C259 Malignant neoplasm of pancreas, unspecified: Secondary | ICD-10-CM

## 2017-07-13 DIAGNOSIS — R600 Localized edema: Secondary | ICD-10-CM

## 2017-07-13 DIAGNOSIS — F1210 Cannabis abuse, uncomplicated: Secondary | ICD-10-CM | POA: Diagnosis not present

## 2017-07-13 DIAGNOSIS — R109 Unspecified abdominal pain: Secondary | ICD-10-CM

## 2017-07-13 LAB — CBC WITH DIFFERENTIAL/PLATELET
BASOS ABS: 0.2 10*3/uL — AB (ref 0–0.1)
BASOS PCT: 1 %
EOS ABS: 0.9 10*3/uL — AB (ref 0–0.7)
EOS PCT: 8 %
HCT: 39.7 % — ABNORMAL LOW (ref 40.0–52.0)
Hemoglobin: 13.8 g/dL (ref 13.0–18.0)
Lymphocytes Relative: 18 %
Lymphs Abs: 2.1 10*3/uL (ref 1.0–3.6)
MCH: 33.3 pg (ref 26.0–34.0)
MCHC: 34.7 g/dL (ref 32.0–36.0)
MCV: 96 fL (ref 80.0–100.0)
MONO ABS: 0.8 10*3/uL (ref 0.2–1.0)
Monocytes Relative: 7 %
Neutro Abs: 7.5 10*3/uL — ABNORMAL HIGH (ref 1.4–6.5)
Neutrophils Relative %: 66 %
PLATELETS: 549 10*3/uL — AB (ref 150–440)
RBC: 4.13 MIL/uL — ABNORMAL LOW (ref 4.40–5.90)
RDW: 15.8 % — AB (ref 11.5–14.5)
WBC: 11.4 10*3/uL — ABNORMAL HIGH (ref 3.8–10.6)

## 2017-07-13 LAB — COMPREHENSIVE METABOLIC PANEL
ALBUMIN: 3.4 g/dL — AB (ref 3.5–5.0)
ALK PHOS: 1187 U/L — AB (ref 38–126)
ALT: 122 U/L — ABNORMAL HIGH (ref 17–63)
ANION GAP: 7 (ref 5–15)
AST: 103 U/L — AB (ref 15–41)
BUN: 9 mg/dL (ref 6–20)
CALCIUM: 9.4 mg/dL (ref 8.9–10.3)
CO2: 28 mmol/L (ref 22–32)
Chloride: 94 mmol/L — ABNORMAL LOW (ref 101–111)
Creatinine, Ser: 0.57 mg/dL — ABNORMAL LOW (ref 0.61–1.24)
GFR calc Af Amer: >60 mL/min (ref >60)
GFR calc non Af Amer: >60 mL/min (ref >60)
GLUCOSE: 106 mg/dL — AB (ref 65–99)
Potassium: 4.4 mmol/L (ref 3.5–5.1)
Sodium: 129 mmol/L — ABNORMAL LOW (ref 135–145)
TOTAL PROTEIN: 7.3 g/dL (ref 6.5–8.1)
Total Bilirubin: 4.9 mg/dL — ABNORMAL HIGH (ref 0.3–1.2)

## 2017-07-13 NOTE — Progress Notes (Signed)
Patient here for pre treatment check. Abdominal pain constant but responds to medication.

## 2017-07-14 LAB — CANCER ANTIGEN 19-9: CA 19-9: 377 U/mL — ABNORMAL HIGH (ref 0–35)

## 2017-07-17 NOTE — Progress Notes (Signed)
Benjamin Diaz  Telephone:([phone] Fax:([phone]  ID: Benjamin Diaz. OB: Feb 28, 1961  MR#: [id number]  IPJ#:[id number]  Patient Care Team: Patient, [name] as PCP - General (General Practice) [name], MD as Consulting Physician (Oncology) [name], MD (Inactive) as Consulting Physician (Gastroenterology) [name], MD as Consulting Physician (Gastroenterology)  CHIEF COMPLAINT: Stage IV pancreatic cancer  INTERVAL HISTORY: Patient returns to clinic today for further evaluation and a second consideration of cycle 9, day 1 of single agent Abraxane.  He continues to have abdominal pain, but this is much better controlled with his current narcotic regimen. He otherwise feels well. He continues to have edema in his right lower extremity which is worse in the evening.  He has no neurologic complaints. He denies any recent fevers or illnesses.  His appetite has improved, but his weight continues to trend down. He has no chest pain or shortness of breath. He denies any nausea, vomiting, constipation, or diarrhea. He has no urinary complaints.  Patient offers no further specific complaints today.  REVIEW OF SYSTEMS:   Review of Systems  Constitutional: Positive for malaise/fatigue and weight loss. Negative for fever.  Respiratory: Negative.  Negative for cough and shortness of breath.   Cardiovascular: Positive for leg swelling. Negative for chest pain.  Gastrointestinal: Positive for abdominal pain. Negative for diarrhea, nausea and vomiting.  Genitourinary: Negative.  Negative for dysuria, frequency and urgency.  Musculoskeletal: Negative.  Negative for joint pain.  Skin: Negative.  Negative for itching and rash.  Neurological: Positive for weakness. Negative for sensory change and focal weakness.  Psychiatric/Behavioral: Negative.  The patient is not nervous/anxious.     As per HPI. Otherwise, a complete review of systems is negative.  PAST  MEDICAL HISTORY: Past Medical History:  Diagnosis Date  . Arthritis   . Asthma   . Atrial fibrillation ([place])   . Cancer ([place])   . Closed left ankle fracture age 52  . COPD (chronic obstructive pulmonary disease) ([place])   . Dysrhythmia   . Pneumonia 2011  . Right shoulder injury 09/27/2016  . Sciatica    left leg pinched nerve numb at times about once a year    PAST SURGICAL HISTORY: Past Surgical History:  Procedure Laterality Date  . BILIARY STENT PLACEMENT N/A 09/01/2016   Procedure: BILIARY STENT PLACEMENT;  Surgeon: [name], MD;  [facility]: [facility];  Service: Endoscopy;  Laterality: N/A;  . DIAGNOSTIC LAPAROSCOPIC LIVER BIOPSY  09/16/2016   Procedure: DIAGNOSTIC LAPAROSCOPIC LIVER BIOPSY;  Surgeon: [name], MD;  Location: [facility];  Service: General;;  . ERCP N/A 07/09/2016   Procedure: ENDOSCOPIC RETROGRADE CHOLANGIOPANCREATOGRAPHY (ERCP);  Surgeon: [name], MD;  Location: [facility];  Service: Endoscopy;  Laterality: N/A;  . ERCP N/A 09/01/2016   Procedure: ENDOSCOPIC RETROGRADE CHOLANGIOPANCREATOGRAPHY (ERCP);  Surgeon: [name], MD;  Location: [facility];  Service: Endoscopy;  Laterality: N/A;  . ERCP N/A 06/28/2017   Procedure: ENDOSCOPIC RETROGRADE CHOLANGIOPANCREATOGRAPHY (ERCP);  Surgeon: [name], MD;  Location[facility];  Service: Endoscopy;  Laterality: N/A;  . ESOPHAGOGASTRODUODENOSCOPY (EGD) WITH PROPOFOL N/A 09/01/2016   Procedure: ESOPHAGOGASTRODUODENOSCOPY (EGD) WITH PROPOFOL;  Surgeon: [name], MD;  Location: [facility];  Service: Gastroenterology;  Laterality: N/A;  . EUS N/A 09/01/2016   Procedure: ESOPHAGEAL ENDOSCOPIC ULTRASOUND (EUS) RADIAL;  Surgeon: [name], MD;  Location: [facility];  Service: Endoscopy;  Laterality: N/A;  . FINE NEEDLE ASPIRATION N/A 09/01/2016   Procedure: FINE NEEDLE ASPIRATION (FNA) RADIAL;  Surgeon: [name], MD;  Location: [facility];  Service: Endoscopy;  Laterality: N/A;  . FOREIGN  BODY REMOVAL N/A 09/01/2016   Procedure: FOREIGN BODY REMOVAL;  Surgeon: [name], MD;  Location: [facility];  Service: Gastroenterology;  Laterality: N/A;  . KNEE SURGERY Right 2002   arthroscopy  . SHOULDER SURGERY Right 2002    FAMILY HISTORY: Family History  Problem Relation Age of Onset  . Heart disease Father     ADVANCED DIRECTIVES (Y/N):  N  HEALTH MAINTENANCE: Social History   Tobacco Use  . Smoking status: Current Every Day Smoker    Packs/day: 1.00    Years: 40.00    Pack years: 40.00    Types: Cigarettes  . Smokeless tobacco: Never Used  Substance Use Topics  . Alcohol use: Yes    Alcohol/week: 4.2 oz    Types: 7 Cans of beer per week    Comment: last drink 5 weeeks ago  april 2018  . Drug use: No     Colonoscopy:  PAP:  Bone density:  Lipid panel:  Allergies  Allergen Reactions  . No Known Allergies     Current Outpatient Medications  Medication Sig Dispense Refill  . albuterol (PROVENTIL HFA;VENTOLIN HFA) 108 (90 Base) MCG/ACT inhaler Inhale 1-2 puffs into the lungs every 6 (six) hours as needed for wheezing or shortness of breath. 1 Inhaler 6  . Ipratropium-Albuterol (COMBIVENT RESPIMAT) 20-100 MCG/ACT AERS respimat Inhale 1 puff into the lungs 4 (four) times daily.    Marland Kitchen morphine (MS CONTIN) 15 MG 12 hr tablet Take 1 tablet (15 mg total) by mouth every 12 (twelve) hours. 60 tablet 0  . capecitabine (XELODA) 500 MG tablet Take 3 tablets (1,500 mg total) by mouth 2 (two) times daily after a meal. Take for 14 days on, then 7 days off. 84 tablet 2  . Oxycodone HCl 10 MG TABS Take 1 tablet (10 mg total) by mouth 2 (two) times daily as needed. (Patient not taking: Reported on 07/20/2017) 60 tablet 0   No current facility-administered medications for this visit.     OBJECTIVE: Vitals:   07/20/17 0946  BP: 124/81  Pulse: 70  Resp: 18  Temp: (!) 94.9 F (34.9 C)     Body mass index is 14.58 kg/m.    ECOG FS:1 - Symptomatic but completely  ambulatory  General: Thin, no acute distress. Eyes: Pink conjunctiva, anicteric sclera. Lungs: Clear to auscultation bilaterally. Heart: Regular rate and rhythm. No rubs, murmurs, or gallops. Abdomen: Soft, nontender, nondistended. No organomegaly noted, normoactive bowel sounds. Musculoskeletal: No edema, cyanosis, or clubbing. Neuro: Alert, answering all questions appropriately. Cranial nerves grossly intact. Skin: Minimal edema.  No rashes or petechiae noted. Psych: Normal affect.  LAB RESULTS:  Lab Results  Component Value Date   NA 129 (L) 07/20/2017   K 3.4 (L) 07/20/2017   CL 94 (L) 07/20/2017   CO2 25 07/20/2017   GLUCOSE 114 (H) 07/20/2017   BUN 6 07/20/2017   CREATININE 0.54 (L) 07/20/2017   CALCIUM 9.4 07/20/2017   PROT 7.4 07/20/2017   ALBUMIN 3.4 (L) 07/20/2017   AST 319 (H) 07/20/2017   ALT 467 (H) 07/20/2017   ALKPHOS 1,992 (H) 07/20/2017   BILITOT 5.5 (H) 07/20/2017   GFRNONAA >60 07/20/2017   GFRAA >60 07/20/2017    Lab Results  Component Value Date   WBC 10.6 07/20/2017   NEUTROABS 7.3 (H) 07/20/2017   HGB 14.1 07/20/2017   HCT 39.8 (L) 07/20/2017  MCV 94.9 07/20/2017   PLT 408 07/20/2017      STUDIES: Ct Abdomen Pelvis W Contrast  Result Date: 07/07/2017 CLINICAL DATA:  Worsening abdominal pain and elevated bilirubin. Malignant neoplasm of the pancreas. EXAM: CT ABDOMEN AND PELVIS WITH CONTRAST TECHNIQUE: Multidetector CT imaging of the abdomen and pelvis was performed using the standard protocol following bolus administration of intravenous contrast. CONTRAST:  19mL ISOVUE-300 IOPAMIDOL (ISOVUE-300) INJECTION 61% COMPARISON:  06/09/2016. FINDINGS: Lower chest: No pleural effusion. Unchanged left lower lobe nodular density measuring 5 mm, image 1/4. Hepatobiliary: There is minimally improved intrahepatic bile duct dilatation. The central common hepatic duct measures 1.1 cm, image 18/2. Previously 1.3 cm. The common bile duct in the porta hepatis  measures 7 mm, image 33/5. Previously 1.7 cm. No pneumobilia noted. Interval placement a second plastic stent which originates in the distal common bile duct and extends into the proximal jejunum. Pancreas: Posterior pancreatic head hypoattenuating mass measures 2.4 by 2.2 cm, image 25/2. Previously 2.1 x 1.5 cm. Borderline main duct dilatation appears similar to the previous study. Small fluid attenuating structure within the tail of pancreas is unchanged measuring 0.8 cm. Spleen: Normal in size without focal abnormality. Adrenals/Urinary Tract: Normal adrenal glands. Stomach/Bowel: Stomach is unremarkable. Contrast opacification proximal small bowel loops noted. These appear increased in caliber measuring up to 4.2 cm, image 53/2. This is compared with 2.5 cm previously. The distal small bowel loops and colon have a normal caliber. Vascular/Lymphatic: Aortic atherosclerosis. No aneurysm. No retroperitoneal adenopathy. No pelvic or inguinal adenopathy. Reproductive: Prostate is unremarkable. Other: No free fluid or fluid collections. No peritoneal nodularity identified. Musculoskeletal: No aggressive lytic or sclerotic bone lesions. IMPRESSION: 1. Interval mild increase in size of low-attenuation hypoattenuating mass within the head of pancreas. 2. Mild interval improvement in biliary dilatation. The central common hepatic duct is decreased in caliber from previous exam. At the porta hepatis the previously noted common bile duct has decreased in caliber from the previous exam. Pneumobilia is no longer visualized however. 3. Abnormal increase caliber of the proximal small bowel loops which measure up to 4.2 cm. Nonspecific cannot rule out partial obstruction versus ileus. Clinical correlation advised. 4. No evidence for liver metastasis 5.  Aortic Atherosclerosis (ICD10-I70.0). Electronically Signed   By: Kerby Moors M.D.   On: 07/07/2017 16:13   Dg C-arm 1-60 Min-no Report  Result Date: 06/28/2017 Fluoroscopy  was utilized by the requesting physician.  No radiographic interpretation.    ASSESSMENT: Stage IV pancreatic cancer  PLAN:   1. Stage IV pancreatic cancer: Imaging, pathology, and Op note reviewed independently confirming stage IV disease with multiple peritoneal implants.  CT scan results from July 07, 2017 reviewed independently and reported as above with mild progression of disease, but no obvious etiology of his increased bilirubin.  CA-19-9 continues to trend up slowly and is now 385.  Because of patient's persistently elevated bilirubin, will discontinue Abraxane and initiate treatment with CAPEOX.  Patient will receive 1000 mg/m or 1500 mg twice per day of keep Cytovene on days 1 through 14 with 7 days off.  He will also receive oxaliplatin on day 1 of every cycle.  This will be a 21-day cycle.  Patient continues to refuse a port, but also can consider modified FOLFIRINOX (oxaliplatin 85 mg/m, leucovorin 400 mg/m, irinotecan 150 mg/m, 5-FU 2400 mg/m over 46 hours) every 2 weeks in the future.  Return to clinic in 1 week for further evaluation and consideration of cycle 1 of CAPEOX.  3. Elevated liver enzymes: AST and ALT continue to trend up.  Change treatment as above.  Appreciate GI input.  Patient recently had biliary stent replaced.   4. Hyponatremia: Chronic.  Sodium is stable at 129 today.  Monitor. 5. Peripheral edema: Continue elevation nightly.  Lower extremity ultrasound did not reveal DVT.   6.  Diarrhea: Resolved. 7.  Elevated bilirubin: CT scan results as above.  Possibly secondary to recent bile duct manipulation.  Since bilirubin is not improving as expected, will switch treatments as above.  Patient expressed understanding and was in agreement with this plan. He also understands that He can call clinic at any time with any questions, concerns, or complaints.   Cancer Staging Pancreatic cancer [facility]) Staging form: Exocrine Pancreas, AJCC 8th Edition - Clinical stage  from 09/25/2016: Stage IV (cTX, cNX, pM1) - Signed by Lloyd Huger, MD on 09/25/2016   Lloyd Huger, MD   07/22/2017 1:55 PM

## 2017-07-20 ENCOUNTER — Telehealth: Payer: Self-pay | Admitting: Pharmacist

## 2017-07-20 ENCOUNTER — Inpatient Hospital Stay: Payer: Medicaid Other

## 2017-07-20 ENCOUNTER — Other Ambulatory Visit: Payer: Self-pay

## 2017-07-20 ENCOUNTER — Inpatient Hospital Stay (HOSPITAL_BASED_OUTPATIENT_CLINIC_OR_DEPARTMENT_OTHER): Payer: Medicaid Other | Admitting: Oncology

## 2017-07-20 VITALS — BP 124/81 | HR 70 | Temp 94.9°F | Resp 18 | Wt 107.5 lb

## 2017-07-20 DIAGNOSIS — C259 Malignant neoplasm of pancreas, unspecified: Secondary | ICD-10-CM

## 2017-07-20 DIAGNOSIS — E871 Hypo-osmolality and hyponatremia: Secondary | ICD-10-CM | POA: Diagnosis not present

## 2017-07-20 DIAGNOSIS — F17210 Nicotine dependence, cigarettes, uncomplicated: Secondary | ICD-10-CM | POA: Diagnosis not present

## 2017-07-20 DIAGNOSIS — R5381 Other malaise: Secondary | ICD-10-CM

## 2017-07-20 DIAGNOSIS — R5383 Other fatigue: Secondary | ICD-10-CM

## 2017-07-20 DIAGNOSIS — R600 Localized edema: Secondary | ICD-10-CM | POA: Diagnosis not present

## 2017-07-20 DIAGNOSIS — R109 Unspecified abdominal pain: Secondary | ICD-10-CM | POA: Diagnosis not present

## 2017-07-20 DIAGNOSIS — R740 Nonspecific elevation of levels of transaminase and lactic acid dehydrogenase [LDH]: Secondary | ICD-10-CM

## 2017-07-20 DIAGNOSIS — R634 Abnormal weight loss: Secondary | ICD-10-CM | POA: Diagnosis not present

## 2017-07-20 DIAGNOSIS — R531 Weakness: Secondary | ICD-10-CM | POA: Diagnosis not present

## 2017-07-20 LAB — COMPREHENSIVE METABOLIC PANEL
ALT: 467 U/L — AB (ref 17–63)
AST: 319 U/L — ABNORMAL HIGH (ref 15–41)
Albumin: 3.4 g/dL — ABNORMAL LOW (ref 3.5–5.0)
Alkaline Phosphatase: 1992 U/L — ABNORMAL HIGH (ref 38–126)
Anion gap: 10 (ref 5–15)
BUN: 6 mg/dL (ref 6–20)
CHLORIDE: 94 mmol/L — AB (ref 101–111)
CO2: 25 mmol/L (ref 22–32)
CREATININE: 0.54 mg/dL — AB (ref 0.61–1.24)
Calcium: 9.4 mg/dL (ref 8.9–10.3)
GFR calc non Af Amer: >60 mL/min (ref >60)
Glucose, Bld: 114 mg/dL — ABNORMAL HIGH (ref 65–99)
POTASSIUM: 3.4 mmol/L — AB (ref 3.5–5.1)
SODIUM: 129 mmol/L — AB (ref 135–145)
Total Bilirubin: 5.5 mg/dL — ABNORMAL HIGH (ref 0.3–1.2)
Total Protein: 7.4 g/dL (ref 6.5–8.1)

## 2017-07-20 LAB — CBC WITH DIFFERENTIAL/PLATELET
Basophils Absolute: 0.1 10*3/uL (ref 0–0.1)
Basophils Relative: 1 %
EOS ABS: 1 10*3/uL — AB (ref 0–0.7)
Eosinophils Relative: 9 %
HEMATOCRIT: 39.8 % — AB (ref 40.0–52.0)
HEMOGLOBIN: 14.1 g/dL (ref 13.0–18.0)
LYMPHS ABS: 1.6 10*3/uL (ref 1.0–3.6)
LYMPHS PCT: 15 %
MCH: 33.6 pg (ref 26.0–34.0)
MCHC: 35.4 g/dL (ref 32.0–36.0)
MCV: 94.9 fL (ref 80.0–100.0)
MONOS PCT: 6 %
Monocytes Absolute: 0.6 10*3/uL (ref 0.2–1.0)
NEUTROS ABS: 7.3 10*3/uL — AB (ref 1.4–6.5)
NEUTROS PCT: 69 %
Platelets: 408 10*3/uL (ref 150–440)
RBC: 4.19 MIL/uL — ABNORMAL LOW (ref 4.40–5.90)
RDW: 15.5 % — ABNORMAL HIGH (ref 11.5–14.5)
WBC: 10.6 10*3/uL (ref 3.8–10.6)

## 2017-07-20 MED ORDER — CAPECITABINE 500 MG PO TABS: 1000.0000 mg/m2 | ORAL_TABLET | Freq: Two times a day (BID) | ORAL | 2 refills | Status: DC

## 2017-07-20 MED FILL — XELODA 500 MG TABLET: 500 | 21 days supply | Qty: 84 | Fill #0

## 2017-07-20 NOTE — Progress Notes (Signed)
Here for follow up.pain level abd/ right sided is 3 level. Stated pain meds are holding off pain.

## 2017-07-20 NOTE — Telephone Encounter (Signed)
Oral Oncology Pharmacist Encounter  Received new prescription for Xeloda (capecitabine) for the treatment of Stage IV pancreatic cancer in combination with oxilaplatin, planned duration until disease progression or unacceptable drug toxicity.  CMP/CBC from 07/20/17 assessed, no relevant lab abnormalities. Prescription dose and frequency assessed.   Current medication list in Epic reviewed, no DDIs with Xeloda identified.  Prescription has been e-scribed to the [facility] for benefits analysis and approval.  Patient education Counseled patient, following his office visit, on administration, dosing, side effects, monitoring, drug-food interactions, safe handling, storage, and disposal. Patient will take 3 tablets (1,500 mg total) by mouth 2 (two) times daily after a meal. Take for 14 days on, then 7 days off.  Side effects include but not limited to: diarrhea, hand-foot syndrome, N/V, decreased WBC/hgb/plt.    Reviewed with patient importance of keeping a medication schedule and plan for any missed doses.  Benjamin Diaz voiced understanding and appreciation. All questions answered. Medication hand out was provided to patient.  Oral Oncology Clinic will continue to follow for copayment issues and start date.  Provided patient with [facility] [place] phone number. Patient knows to call the office with questions or concerns. Oral Chemotherapy Navigation Clinic will continue to follow.  Darl Pikes, PharmD, BCPS Hematology/Oncology Clinical Pharmacist [facility] [place] [facility] [phone]  07/20/2017 11:15 AM

## 2017-07-20 NOTE — Telephone Encounter (Signed)
Oral Chemotherapy Pharmacist Encounter   Called patient to set-up medication delivery. Medication should be delivered from [place] by Friday 07/22/17.   Patient knows not to started until he starts his new infusion (oxaliplatin) therapy next week. This will allow his Xeloda and oxaliplatin cycles to be aligned.    Darl Pikes, PharmD, BCPS Hematology/Oncology Clinical Pharmacist [facility] [place] [facility] [phone]  07/20/2017 1:52 PM

## 2017-07-21 LAB — CANCER ANTIGEN 19-9: CA 19-9: 385 U/mL — ABNORMAL HIGH (ref 0–35)

## 2017-07-24 NOTE — Progress Notes (Signed)
[place]  Telephone:([phone] [phone]  ID: Benjamin Diaz. OB: 03/27/1961  MR#: [id number]  QIO#:[id number]  Patient Care Team: Patient, [name] as PCP - General (General Practice) [name], MD as Consulting Physician (Oncology) [name], MD (Inactive) as Consulting Physician (Gastroenterology) [name], MD as Consulting Physician (Gastroenterology)  CHIEF COMPLAINT: Stage IV pancreatic cancer  INTERVAL HISTORY: Clinic today for further evaluation and initiation of cycle 1 of CAPEOX.  He continues to have a crampy abdominal pain, but otherwise feels well.  He has a poor appetite. He continues to have edema in his right lower extremity which is worse in the evening.  He has no neurologic complaints. He denies any recent fevers or illnesses. He has no chest pain or shortness of breath. He denies any nausea, vomiting, constipation, or diarrhea. He has no urinary complaints.  Patient offers no further specific complaints today.  REVIEW OF SYSTEMS:   Review of Systems  Constitutional: Positive for malaise/fatigue and weight loss. Negative for fever.  Respiratory: Negative.  Negative for cough and shortness of breath.   Cardiovascular: Positive for leg swelling. Negative for chest pain.  Gastrointestinal: Positive for abdominal pain. Negative for diarrhea, nausea and vomiting.  Genitourinary: Negative.  Negative for dysuria, frequency and urgency.  Musculoskeletal: Negative.  Negative for joint pain.  Skin: Negative.  Negative for itching and rash.  Neurological: Positive for weakness. Negative for sensory change and focal weakness.  Psychiatric/Behavioral: Negative.  The patient is not nervous/anxious.     As per HPI. Otherwise, a complete review of systems is negative.  PAST MEDICAL HISTORY: Past Medical History:  Diagnosis Date  . Arthritis   . Asthma   . Atrial fibrillation ([place])   . Cancer ([place])   . Closed left ankle  fracture age 72  . COPD (chronic obstructive pulmonary disease) ([place])   . Dysrhythmia   . Pneumonia 2011  . Right shoulder injury 09/27/2016  . Sciatica    left leg pinched nerve numb at times about once a year    PAST SURGICAL HISTORY: Past Surgical History:  Procedure Laterality Date  . BILIARY STENT PLACEMENT N/A 09/01/2016   Procedure: BILIARY STENT PLACEMENT;  Surgeon: [name], MD;  Location: [facility];  Service: Endoscopy;  Laterality: N/A;  . DIAGNOSTIC LAPAROSCOPIC LIVER BIOPSY  09/16/2016   Procedure: DIAGNOSTIC LAPAROSCOPIC LIVER BIOPSY;  Surgeon: [name], MD;  Location: [place];  Service: General;;  . ERCP N/A 07/09/2016   Procedure: ENDOSCOPIC RETROGRADE CHOLANGIOPANCREATOGRAPHY (ERCP);  Surgeon: [name], MD;  Location: [facility];  Service: Endoscopy;  Laterality: N/A;  . ERCP N/A 09/01/2016   Procedure: ENDOSCOPIC RETROGRADE CHOLANGIOPANCREATOGRAPHY (ERCP);  Surgeon: [name], MD;  Location: [facility];  Service: Endoscopy;  Laterality: N/A;  . ERCP N/A 06/28/2017   Procedure: ENDOSCOPIC RETROGRADE CHOLANGIOPANCREATOGRAPHY (ERCP);  Surgeon: [name], MD;  Location: [facility] ENDOSCOPY;  Service: Endoscopy;  Laterality: N/A;  . ESOPHAGOGASTRODUODENOSCOPY (EGD) WITH PROPOFOL N/A 09/01/2016   Procedure: ESOPHAGOGASTRODUODENOSCOPY (EGD) WITH PROPOFOL;  Surgeon: [name], MD;  [facility];  Service: Gastroenterology;  Laterality: N/A;  . EUS N/A 09/01/2016   Procedure: ESOPHAGEAL ENDOSCOPIC ULTRASOUND (EUS) RADIAL;  Surgeon: [name], MD;  [facility];  Service: Endoscopy;  Laterality: N/A;  . FINE NEEDLE ASPIRATION N/A 09/01/2016   Procedure: FINE NEEDLE ASPIRATION (FNA) RADIAL;  Surgeon: [name], MD;  [facility];  Service: Endoscopy;  Laterality: N/A;  . FOREIGN BODY REMOVAL N/A 09/01/2016  Procedure: FOREIGN BODY REMOVAL;  Surgeon: [name], MD;  [facility];  Service: Gastroenterology;  Laterality:  N/A;  . KNEE SURGERY Right 2002   arthroscopy  . SHOULDER SURGERY Right 2002    FAMILY HISTORY: Family History  Problem Relation Age of Onset  . Heart disease Father     ADVANCED DIRECTIVES (Y/N):  N  HEALTH MAINTENANCE: Social History   Tobacco Use  . Smoking status: Current Every Day Smoker    Packs/day: 1.00    Years: 40.00    Pack years: 40.00    Types: Cigarettes  . Smokeless tobacco: Never Used  Substance Use Topics  . Alcohol use: Yes    Alcohol/week: 4.2 oz    Types: 7 Cans of beer per week    Comment: last drink 5 weeeks ago  april 2018  . Drug use: No     Colonoscopy:  PAP:  Bone density:  Lipid panel:  Allergies  Allergen Reactions  . No Known Allergies     Current Outpatient Medications  Medication Sig Dispense Refill  . albuterol (PROVENTIL HFA;VENTOLIN HFA) 108 (90 Base) MCG/ACT inhaler Inhale 1-2 puffs into the lungs every 6 (six) hours as needed for wheezing or shortness of breath. 1 Inhaler 6  . capecitabine (XELODA) 500 MG tablet Take 3 tablets (1,500 mg total) by mouth 2 (two) times daily after a meal. Take for 14 days on, then 7 days off. (Patient not taking: Reported on 07/27/2017) 84 tablet 2  . Ipratropium-Albuterol (COMBIVENT RESPIMAT) 20-100 MCG/ACT AERS respimat Inhale 1 puff into the lungs 4 (four) times daily.    Marland Kitchen morphine (MS CONTIN) 15 MG 12 hr tablet Take 1 tablet (15 mg total) by mouth every 12 (twelve) hours. (Patient not taking: Reported on 07/27/2017) 60 tablet 0  . Oxycodone HCl 10 MG TABS Take 1 tablet (10 mg total) by mouth 2 (two) times daily as needed. (Patient not taking: Reported on 07/20/2017) 60 tablet 0   No current facility-administered medications for this visit.     OBJECTIVE: Vitals:   07/27/17 1012  BP: 112/75  Pulse: 78  Resp: 18  Temp: (!) 97.3 F (36.3 C)     Body mass index is 14.78 kg/m.    ECOG FS:1 - Symptomatic but completely ambulatory  General: Thin, no acute distress. Eyes: Pink conjunctiva,  anicteric sclera. Lungs: Clear to auscultation bilaterally. Heart: Regular rate and rhythm. No rubs, murmurs, or gallops. Abdomen: Soft, mildly tender, normoactive bowel sounds. Musculoskeletal: No edema, cyanosis, or clubbing. Neuro: Alert, answering all questions appropriately. Cranial nerves grossly intact. Skin: No rashes or petechiae noted. Psych: Normal affect.  LAB RESULTS:  Lab Results  Component Value Date   NA 124 (L) 07/27/2017   K 4.7 07/27/2017   CL 88 (L) 07/27/2017   CO2 25 07/27/2017   GLUCOSE 104 (H) 07/27/2017   BUN 13 07/27/2017   CREATININE 0.44 (L) 07/27/2017   CALCIUM 9.4 07/27/2017   PROT 7.6 07/27/2017   ALBUMIN 3.4 (L) 07/27/2017   AST 271 (H) 07/27/2017   ALT 389 (H) 07/27/2017   ALKPHOS 2,174 (H) 07/27/2017   BILITOT 4.1 (H) 07/27/2017   GFRNONAA >60 07/27/2017   GFRAA >60 07/27/2017    Lab Results  Component Value Date   WBC 8.6 07/27/2017   NEUTROABS 5.7 07/27/2017   HGB 14.3 07/27/2017   HCT 40.2 07/27/2017   MCV 93.8 07/27/2017   PLT 421 07/27/2017      STUDIES: Ct Abdomen Pelvis W Contrast  Result Date: 07/07/2017 CLINICAL DATA:  Worsening abdominal pain and elevated bilirubin. Malignant neoplasm of the pancreas. EXAM: CT ABDOMEN AND PELVIS WITH CONTRAST TECHNIQUE: Multidetector CT imaging of the abdomen and pelvis was performed using the standard protocol following bolus administration of intravenous contrast. CONTRAST:  60mL ISOVUE-300 IOPAMIDOL (ISOVUE-300) INJECTION 61% COMPARISON:  06/09/2016. FINDINGS: Lower chest: No pleural effusion. Unchanged left lower lobe nodular density measuring 5 mm, image 1/4. Hepatobiliary: There is minimally improved intrahepatic bile duct dilatation. The central common hepatic duct measures 1.1 cm, image 18/2. Previously 1.3 cm. The common bile duct in the porta hepatis measures 7 mm, image 33/5. Previously 1.7 cm. No pneumobilia noted. Interval placement a second plastic stent which originates in the  distal common bile duct and extends into the proximal jejunum. Pancreas: Posterior pancreatic head hypoattenuating mass measures 2.4 by 2.2 cm, image 25/2. Previously 2.1 x 1.5 cm. Borderline main duct dilatation appears similar to the previous study. Small fluid attenuating structure within the tail of pancreas is unchanged measuring 0.8 cm. Spleen: Normal in size without focal abnormality. Adrenals/Urinary Tract: Normal adrenal glands. Stomach/Bowel: Stomach is unremarkable. Contrast opacification proximal small bowel loops noted. These appear increased in caliber measuring up to 4.2 cm, image 53/2. This is compared with 2.5 cm previously. The distal small bowel loops and colon have a normal caliber. Vascular/Lymphatic: Aortic atherosclerosis. No aneurysm. No retroperitoneal adenopathy. No pelvic or inguinal adenopathy. Reproductive: Prostate is unremarkable. Other: No free fluid or fluid collections. No peritoneal nodularity identified. Musculoskeletal: No aggressive lytic or sclerotic bone lesions. IMPRESSION: 1. Interval mild increase in size of low-attenuation hypoattenuating mass within the head of pancreas. 2. Mild interval improvement in biliary dilatation. The central common hepatic duct is decreased in caliber from previous exam. At the porta hepatis the previously noted common bile duct has decreased in caliber from the previous exam. Pneumobilia is no longer visualized however. 3. Abnormal increase caliber of the proximal small bowel loops which measure up to 4.2 cm. Nonspecific cannot rule out partial obstruction versus ileus. Clinical correlation advised. 4. No evidence for liver metastasis 5.  Aortic Atherosclerosis (ICD10-I70.0). Electronically Signed   By: Kerby Moors M.D.   On: 07/07/2017 16:13   Dg C-arm 1-60 Min-no Report  Result Date: 06/28/2017 Fluoroscopy was utilized by the requesting physician.  No radiographic interpretation.    ASSESSMENT: Stage IV pancreatic cancer  PLAN:    1. Stage IV pancreatic cancer: Imaging, pathology, and Op note reviewed independently confirming stage IV disease with multiple peritoneal implants.  CT scan results from July 07, 2017 reviewed independently with mild progression of disease, but no obvious etiology of his increased bilirubin.  CA-19-9 continues to trend up slowly and is now 407.  Because of patient's persistently elevated bilirubin, will discontinue Abraxane and initiate treatment with CAPEOX.  Patient will receive 1000 mg/m or 1500 mg twice per day of capcitabine on days 1 through 14 with 7 days off.  He will also receive oxaliplatin on day 1 of every cycle.  This will be a 21-day cycle.  Patient continues to refuse a port, but also can consider modified FOLFIRINOX (oxaliplatin 85 mg/m, leucovorin 400 mg/m, irinotecan 150 mg/m, 5-FU 2400 mg/m over 46 hours) every 2 weeks in the future.  Proceed with cycle 1 of oxaliplatin today.  Patient is also initiating oral capcitibine  today.  R return to clinic in 1 week for laboratory work and further evaluation and then 3 weeks for consideration of cycle 2 of CAPEOX.  3. Elevated liver enzymes: AST and ALT continue to be elevated, but are now trending down.  Patient was given a referral back to GI for further evaluation. 4. Hyponatremia: Patient's sodium level is trending down.  Monitor and consider salt tablets in the future. 5. Peripheral edema: Continue elevation nightly.  Lower extremity ultrasound did not reveal DVT.   6.  Diarrhea: Resolved. 7.  Elevated bilirubin: CT scan results as above.  Slightly improved.  Referral to GI as above.   Patient expressed understanding and was in agreement with this plan. He also understands that He can call clinic at any time with any questions, concerns, or complaints.   Cancer Staging Pancreatic cancer [facility]) Staging form: Exocrine Pancreas, AJCC 8th Edition - Clinical stage from 09/25/2016: Stage IV (cTX, cNX, pM1) - Signed by Lloyd Huger, MD on 09/25/2016   Lloyd Huger, MD   07/28/2017 10:39 AM

## 2017-07-27 ENCOUNTER — Encounter: Payer: Self-pay | Admitting: Oncology

## 2017-07-27 ENCOUNTER — Inpatient Hospital Stay: Payer: Medicaid Other | Attending: Oncology

## 2017-07-27 ENCOUNTER — Telehealth: Payer: Self-pay | Admitting: Pharmacist

## 2017-07-27 ENCOUNTER — Inpatient Hospital Stay: Payer: Medicaid Other

## 2017-07-27 ENCOUNTER — Inpatient Hospital Stay (HOSPITAL_BASED_OUTPATIENT_CLINIC_OR_DEPARTMENT_OTHER): Payer: Medicaid Other | Admitting: Oncology

## 2017-07-27 ENCOUNTER — Other Ambulatory Visit: Payer: Self-pay

## 2017-07-27 VITALS — BP 112/75 | HR 78 | Temp 97.3°F | Resp 18 | Wt 109.0 lb

## 2017-07-27 DIAGNOSIS — C259 Malignant neoplasm of pancreas, unspecified: Secondary | ICD-10-CM

## 2017-07-27 DIAGNOSIS — R5381 Other malaise: Secondary | ICD-10-CM | POA: Diagnosis not present

## 2017-07-27 DIAGNOSIS — R5383 Other fatigue: Secondary | ICD-10-CM

## 2017-07-27 DIAGNOSIS — M7989 Other specified soft tissue disorders: Secondary | ICD-10-CM | POA: Diagnosis not present

## 2017-07-27 DIAGNOSIS — E871 Hypo-osmolality and hyponatremia: Secondary | ICD-10-CM | POA: Insufficient documentation

## 2017-07-27 DIAGNOSIS — R531 Weakness: Secondary | ICD-10-CM

## 2017-07-27 DIAGNOSIS — R600 Localized edema: Secondary | ICD-10-CM | POA: Diagnosis not present

## 2017-07-27 DIAGNOSIS — R109 Unspecified abdominal pain: Secondary | ICD-10-CM | POA: Insufficient documentation

## 2017-07-27 DIAGNOSIS — Z5111 Encounter for antineoplastic chemotherapy: Secondary | ICD-10-CM | POA: Insufficient documentation

## 2017-07-27 DIAGNOSIS — R634 Abnormal weight loss: Secondary | ICD-10-CM | POA: Diagnosis not present

## 2017-07-27 LAB — CBC WITH DIFFERENTIAL/PLATELET
Basophils Absolute: 0.2 10*3/uL — ABNORMAL HIGH (ref 0–0.1)
Basophils Relative: 2 %
EOS ABS: 0.6 10*3/uL (ref 0–0.7)
EOS PCT: 7 %
HCT: 40.2 % (ref 40.0–52.0)
Hemoglobin: 14.3 g/dL (ref 13.0–18.0)
LYMPHS ABS: 1.4 10*3/uL (ref 1.0–3.6)
Lymphocytes Relative: 16 %
MCH: 33.4 pg (ref 26.0–34.0)
MCHC: 35.6 g/dL (ref 32.0–36.0)
MCV: 93.8 fL (ref 80.0–100.0)
Monocytes Absolute: 0.8 10*3/uL (ref 0.2–1.0)
Monocytes Relative: 9 %
Neutro Abs: 5.7 10*3/uL (ref 1.4–6.5)
Neutrophils Relative %: 66 %
Platelets: 421 10*3/uL (ref 150–440)
RBC: 4.29 MIL/uL — ABNORMAL LOW (ref 4.40–5.90)
RDW: 14.6 % — AB (ref 11.5–14.5)
WBC: 8.6 10*3/uL (ref 3.8–10.6)

## 2017-07-27 LAB — COMPREHENSIVE METABOLIC PANEL
ALK PHOS: 2174 U/L — AB (ref 38–126)
ALT: 389 U/L — AB (ref 17–63)
AST: 271 U/L — AB (ref 15–41)
Albumin: 3.4 g/dL — ABNORMAL LOW (ref 3.5–5.0)
Anion gap: 11 (ref 5–15)
BUN: 13 mg/dL (ref 6–20)
CALCIUM: 9.4 mg/dL (ref 8.9–10.3)
CHLORIDE: 88 mmol/L — AB (ref 101–111)
CO2: 25 mmol/L (ref 22–32)
CREATININE: 0.44 mg/dL — AB (ref 0.61–1.24)
GFR calc Af Amer: >60 mL/min (ref >60)
Glucose, Bld: 104 mg/dL — ABNORMAL HIGH (ref 65–99)
Potassium: 4.7 mmol/L (ref 3.5–5.1)
SODIUM: 124 mmol/L — AB (ref 135–145)
Total Bilirubin: 4.1 mg/dL — ABNORMAL HIGH (ref 0.3–1.2)
Total Protein: 7.6 g/dL (ref 6.5–8.1)

## 2017-07-27 MED ORDER — PALONOSETRON HCL INJECTION 0.25 MG/5ML
0.2500 mg | Freq: Once | INTRAVENOUS | Status: AC
  Administered 2017-07-27: 0.25 mg via INTRAVENOUS
  Filled 2017-07-27: qty 5

## 2017-07-27 MED ORDER — DEXAMETHASONE SODIUM PHOSPHATE 10 MG/ML IJ SOLN
10.0000 mg | Freq: Once | INTRAMUSCULAR | Status: AC
  Administered 2017-07-27: 10 mg via INTRAVENOUS
  Filled 2017-07-27: qty 1

## 2017-07-27 MED ORDER — SODIUM CHLORIDE 0.9 % IV SOLN: 10.0000 mg | Freq: Once | INTRAVENOUS | Status: DC

## 2017-07-27 MED ORDER — OXALIPLATIN CHEMO INJECTION 100 MG/20ML
150.0000 mg | Freq: Once | INTRAVENOUS | Status: AC
  Administered 2017-07-27: 150 mg via INTRAVENOUS
  Filled 2017-07-27: qty 20

## 2017-07-27 MED ORDER — DEXTROSE 5 % IV SOLN
Freq: Once | INTRAVENOUS | Status: AC
  Administered 2017-07-27: 11:00:00 via INTRAVENOUS
  Filled 2017-07-27: qty 1000

## 2017-07-27 NOTE — Progress Notes (Signed)
Pt in for follow up, states has continued to feel worse since having surgery.  Pt had xeloda filled and has not started per instructions.  Pt reports continuous pain in mid abdomen all the time, rates 5-6.  States medication does not help and he is not taking.

## 2017-07-27 NOTE — Telephone Encounter (Signed)
Oral Chemotherapy Pharmacist Encounter   Met with patient in infusion and provided him with a medication calendar for his Xeloda. Reviewed with him administration and dosing. He stated his understanding and plans to take his first dose this evening following dinner.    Darl Pikes, PharmD, BCPS Hematology/Oncology Clinical Pharmacist [facility] [place] [facility] [phone]  07/27/2017 11:48 AM

## 2017-07-28 LAB — CANCER ANTIGEN 19-9: CA 19-9: 407 U/mL — ABNORMAL HIGH (ref 0–35)

## 2017-08-01 NOTE — Progress Notes (Signed)
Benjamin Diaz  Telephone:([phone] [phone]  ID: Benjamin Diaz. OB: April 23, 1960  MR#: [id number]  YDX#:[id number]  Patient Care Team: Patient, [name] as PCP - General (General Practice) [name], MD as Consulting Physician (Oncology) [name], MD (Inactive) as Consulting Physician (Gastroenterology) [name], MD as Consulting Physician (Gastroenterology)  CHIEF COMPLAINT: Stage IV pancreatic cancer  INTERVAL HISTORY: Patient returns to clinic today for further evaluation and to assess his toleration of cycle 1 of CAPEOX.  He tolerated his treatment well without significant side effects.  He continues to have abdominal pain, but otherwise feels well.  He continues to have a poor appetite.  Patient offers no further specific complaints today.  He has no neurologic complaints. He denies any recent fevers or illnesses. He has no chest pain or shortness of breath. He denies any nausea, vomiting, constipation, or diarrhea. He has no urinary complaints.  Patient offers no further specific complaints today.  REVIEW OF SYSTEMS:   Review of Systems  Constitutional: Positive for malaise/fatigue and weight loss. Negative for fever.  Respiratory: Negative.  Negative for cough and shortness of breath.   Cardiovascular: Positive for leg swelling. Negative for chest pain.  Gastrointestinal: Positive for abdominal pain. Negative for diarrhea, nausea and vomiting.  Genitourinary: Negative.  Negative for dysuria, frequency and urgency.  Musculoskeletal: Negative.  Negative for joint pain.  Skin: Negative.  Negative for itching and rash.  Neurological: Positive for weakness. Negative for sensory change and focal weakness.  Psychiatric/Behavioral: Negative.  The patient is not nervous/anxious.     As per HPI. Otherwise, a complete review of systems is negative.  PAST MEDICAL HISTORY: Past Medical History:  Diagnosis Date  . Arthritis   . Asthma    . Atrial fibrillation ([place])   . Cancer ([place])   . Closed left ankle fracture age 37  . COPD (chronic obstructive pulmonary disease) ([place])   . Dysrhythmia   . Pneumonia 2011  . Right shoulder injury 09/27/2016  . Sciatica    left leg pinched nerve numb at times about once a year    PAST SURGICAL HISTORY: Past Surgical History:  Procedure Laterality Date  . BILIARY STENT PLACEMENT N/A 09/01/2016   Procedure: BILIARY STENT PLACEMENT;  Surgeon: [name], MD;  [facility];  Service: Endoscopy;  Laterality: N/A;  . DIAGNOSTIC LAPAROSCOPIC LIVER BIOPSY  09/16/2016   Procedure: DIAGNOSTIC LAPAROSCOPIC LIVER BIOPSY;  Surgeon: [name], MD;  Location: [place];  Service: General;;  . ERCP N/A 07/09/2016   Procedure: ENDOSCOPIC RETROGRADE CHOLANGIOPANCREATOGRAPHY (ERCP);  Surgeon: [name], MD;  Location: [facility];  Service: Endoscopy;  Laterality: N/A;  . ERCP N/A 09/01/2016   Procedure: ENDOSCOPIC RETROGRADE CHOLANGIOPANCREATOGRAPHY (ERCP);  Surgeon: [name], MD;  Location: [facility];  Service: Endoscopy;  Laterality: N/A;  . ERCP N/A 06/28/2017   Procedure: ENDOSCOPIC RETROGRADE CHOLANGIOPANCREATOGRAPHY (ERCP);  Surgeon: [name], MD;  Location[facility];  Service: Endoscopy;  Laterality: N/A;  . ESOPHAGOGASTRODUODENOSCOPY (EGD) WITH PROPOFOL N/A 09/01/2016   Procedure: ESOPHAGOGASTRODUODENOSCOPY (EGD) WITH PROPOFOL;  Surgeon: [name], MD;  [facility];  Service: Gastroenterology;  Laterality: N/A;  . EUS N/A 09/01/2016   Procedure: ESOPHAGEAL ENDOSCOPIC ULTRASOUND (EUS) RADIAL;  Surgeon: [name], MD;  [facility];  Service: Endoscopy;  Laterality: N/A;  . FINE NEEDLE ASPIRATION N/A 09/01/2016   Procedure: FINE NEEDLE ASPIRATION (FNA) RADIAL;  Surgeon: [name], MD;  [facility];  Service: Endoscopy;  Laterality: N/A;  .  FOREIGN BODY REMOVAL N/A 09/01/2016   Procedure: FOREIGN BODY REMOVAL;  Surgeon: [name], MD;  Location: [facility];  Service: Gastroenterology;  Laterality: N/A;  . KNEE SURGERY Right 2002   arthroscopy  . SHOULDER SURGERY Right 2002    FAMILY HISTORY: Family History  Problem Relation Age of Onset  . Heart disease Father     ADVANCED DIRECTIVES (Y/N):  N  HEALTH MAINTENANCE: Social History   Tobacco Use  . Smoking status: Current Every Day Smoker    Packs/day: 1.00    Years: 40.00    Pack years: 40.00    Types: Cigarettes  . Smokeless tobacco: Never Used  Substance Use Topics  . Alcohol use: Yes    Alcohol/week: 4.2 oz    Types: 7 Cans of beer per week    Comment: last drink 5 weeeks ago  april 2018  . Drug use: No     Colonoscopy:  PAP:  Bone density:  Lipid panel:  Allergies  Allergen Reactions  . No Known Allergies     Current Outpatient Medications  Medication Sig Dispense Refill  . capecitabine (XELODA) 500 MG tablet Take 3 tablets (1,500 mg total) by mouth 2 (two) times daily after a meal. Take for 14 days on, then 7 days off. 84 tablet 2  . Ipratropium-Albuterol (COMBIVENT RESPIMAT) 20-100 MCG/ACT AERS respimat Inhale 1 puff into the lungs 4 (four) times daily.    . Oxycodone HCl 10 MG TABS Take 1 tablet (10 mg total) by mouth 2 (two) times daily as needed. 60 tablet 0  . albuterol (PROVENTIL HFA;VENTOLIN HFA) 108 (90 Base) MCG/ACT inhaler Inhale 1-2 puffs into the lungs every 6 (six) hours as needed for wheezing or shortness of breath. (Patient not taking: Reported on 08/03/2017) 1 Inhaler 6  . morphine (MS CONTIN) 15 MG 12 hr tablet Take 1 tablet (15 mg total) by mouth every 12 (twelve) hours. (Patient not taking: Reported on 07/27/2017) 60 tablet 0   No current facility-administered medications for this visit.    OBJECTIVE: Vitals:   08/03/17 1433  BP: 122/82  Pulse: 76  Resp: 18  Temp: (!) 94.7 F (34.8 C)     Body mass index is 14.73 kg/m.    ECOG FS:1 - Symptomatic but completely ambulatory  General: Thin, no acute  distress. Eyes: Pink conjunctiva, anicteric sclera. HEENT: Normocephalic, moist mucous membranes, clear oropharnyx. Lungs: Clear to auscultation bilaterally. Heart: Regular rate and rhythm. No rubs, murmurs, or gallops. Abdomen: Soft, nontender, nondistended. No organomegaly noted, normoactive bowel sounds. Musculoskeletal: No edema, cyanosis, or clubbing. Neuro: Alert, answering all questions appropriately. Cranial nerves grossly intact. Skin: No rashes or petechiae noted. Psych: Normal affect. Lymphatics: No cervical, calvicular, axillary or inguinal LAD.  LAB RESULTS:  Lab Results  Component Value Date   NA 130 (L) 08/03/2017   K 4.4 08/03/2017   CL 94 (L) 08/03/2017   CO2 26 08/03/2017   GLUCOSE 107 (H) 08/03/2017   BUN 12 08/03/2017   CREATININE 0.39 (L) 08/03/2017   CALCIUM 9.2 08/03/2017   PROT 6.9 08/03/2017   ALBUMIN 3.2 (L) 08/03/2017   AST 441 (H) 08/03/2017   ALT 577 (H) 08/03/2017   ALKPHOS 2,170 (H) 08/03/2017   BILITOT 3.0 (H) 08/03/2017   GFRNONAA >60 08/03/2017   GFRAA >60 08/03/2017    Lab Results  Component Value Date   WBC 10.7 (H) 08/03/2017   NEUTROABS 7.1 (H) 08/03/2017   HGB 13.6 08/03/2017   HCT 39.5 (L) 08/03/2017  MCV 94.3 08/03/2017   PLT 444 (H) 08/03/2017      STUDIES: No results found.  ASSESSMENT: Stage IV pancreatic cancer  PLAN:   1. Stage IV pancreatic cancer: Imaging, pathology, and Op note reviewed independently confirming stage IV disease with multiple peritoneal implants.  CT scan results from July 07, 2017 reviewed independently with mild progression of disease, but no obvious etiology of his increased bilirubin. CA-19-9 continues to trend up and is now 510.  Continue with CAPEOX. Patient will receive 1000 mg/m or 1500 mg twice per day of capcitabine on days 1 through 14 with 7 days off.  He will also receive oxaliplatin on day 1 of every cycle.  This will be a 21-day cycle.  Patient continues to refuse a port, but also  can consider modified FOLFIRINOX (oxaliplatin 85 mg/m, leucovorin 400 mg/m, irinotecan 150 mg/m, 5-FU 2400 mg/m over 46 hours) every 2 weeks in the future.   Return to clinic in 2 weeks for further evaluation and consideration of cycle 2 of CAPEOX.   3. Elevated liver enzymes: AST and ALT have trended back up, monitor.  A referral was previously given back to GI for further evaluation.   4. Hyponatremia: Patient's sodium has trended back up and although still decreased as approximately his baseline.   5. Peripheral edema: Continue elevation nightly.  Lower extremity ultrasound did not reveal DVT.   6.  Diarrhea: Resolved. 7.  Elevated bilirubin: Slowly trending down.  Referral to GI as above. 8.  Pain: Continue MS Contin every 12 hours and oxycodone as needed.   Patient expressed understanding and was in agreement with this plan. He also understands that He can call clinic at any time with any questions, concerns, or complaints.   Cancer Staging Pancreatic cancer [facility]) Staging form: Exocrine Pancreas, AJCC 8th Edition - Clinical stage from 09/25/2016: Stage IV (cTX, cNX, pM1) - Signed by Lloyd Huger, MD on 09/25/2016   Lloyd Huger, MD   08/07/2017 3:46 PM

## 2017-08-03 ENCOUNTER — Other Ambulatory Visit: Payer: Self-pay

## 2017-08-03 ENCOUNTER — Inpatient Hospital Stay (HOSPITAL_BASED_OUTPATIENT_CLINIC_OR_DEPARTMENT_OTHER): Payer: Medicaid Other | Admitting: Oncology

## 2017-08-03 ENCOUNTER — Inpatient Hospital Stay: Payer: Medicaid Other

## 2017-08-03 VITALS — BP 122/82 | HR 76 | Temp 94.7°F | Resp 18 | Wt 108.6 lb

## 2017-08-03 DIAGNOSIS — R109 Unspecified abdominal pain: Secondary | ICD-10-CM | POA: Diagnosis not present

## 2017-08-03 DIAGNOSIS — R5381 Other malaise: Secondary | ICD-10-CM | POA: Diagnosis not present

## 2017-08-03 DIAGNOSIS — R531 Weakness: Secondary | ICD-10-CM | POA: Diagnosis not present

## 2017-08-03 DIAGNOSIS — C259 Malignant neoplasm of pancreas, unspecified: Secondary | ICD-10-CM | POA: Diagnosis not present

## 2017-08-03 DIAGNOSIS — R600 Localized edema: Secondary | ICD-10-CM

## 2017-08-03 DIAGNOSIS — R634 Abnormal weight loss: Secondary | ICD-10-CM | POA: Diagnosis not present

## 2017-08-03 DIAGNOSIS — E871 Hypo-osmolality and hyponatremia: Secondary | ICD-10-CM | POA: Diagnosis not present

## 2017-08-03 DIAGNOSIS — M7989 Other specified soft tissue disorders: Secondary | ICD-10-CM

## 2017-08-03 DIAGNOSIS — Z5111 Encounter for antineoplastic chemotherapy: Secondary | ICD-10-CM | POA: Diagnosis not present

## 2017-08-03 DIAGNOSIS — R5383 Other fatigue: Secondary | ICD-10-CM

## 2017-08-03 LAB — CBC WITH DIFFERENTIAL/PLATELET
BASOS ABS: 0.1 10*3/uL (ref 0–0.1)
Basophils Relative: 1 %
EOS PCT: 9 %
Eosinophils Absolute: 1 10*3/uL — ABNORMAL HIGH (ref 0–0.7)
HEMATOCRIT: 39.5 % — AB (ref 40.0–52.0)
Hemoglobin: 13.6 g/dL (ref 13.0–18.0)
LYMPHS PCT: 15 %
Lymphs Abs: 1.6 10*3/uL (ref 1.0–3.6)
MCH: 32.5 pg (ref 26.0–34.0)
MCHC: 34.4 g/dL (ref 32.0–36.0)
MCV: 94.3 fL (ref 80.0–100.0)
MONO ABS: 0.9 10*3/uL (ref 0.2–1.0)
MONOS PCT: 8 %
NEUTROS ABS: 7.1 10*3/uL — AB (ref 1.4–6.5)
Neutrophils Relative %: 67 %
PLATELETS: 444 10*3/uL — AB (ref 150–440)
RBC: 4.19 MIL/uL — ABNORMAL LOW (ref 4.40–5.90)
RDW: 14.7 % — ABNORMAL HIGH (ref 11.5–14.5)
WBC: 10.7 10*3/uL — ABNORMAL HIGH (ref 3.8–10.6)

## 2017-08-03 LAB — COMPREHENSIVE METABOLIC PANEL
ALK PHOS: 2170 U/L — AB (ref 38–126)
ALT: 577 U/L — ABNORMAL HIGH (ref 17–63)
AST: 441 U/L — AB (ref 15–41)
Albumin: 3.2 g/dL — ABNORMAL LOW (ref 3.5–5.0)
Anion gap: 10 (ref 5–15)
BILIRUBIN TOTAL: 3 mg/dL — AB (ref 0.3–1.2)
BUN: 12 mg/dL (ref 6–20)
CALCIUM: 9.2 mg/dL (ref 8.9–10.3)
CO2: 26 mmol/L (ref 22–32)
Chloride: 94 mmol/L — ABNORMAL LOW (ref 101–111)
Creatinine, Ser: 0.39 mg/dL — ABNORMAL LOW (ref 0.61–1.24)
GFR calc Af Amer: >60 mL/min (ref >60)
GFR calc non Af Amer: >60 mL/min (ref >60)
Glucose, Bld: 107 mg/dL — ABNORMAL HIGH (ref 65–99)
Potassium: 4.4 mmol/L (ref 3.5–5.1)
SODIUM: 130 mmol/L — AB (ref 135–145)
TOTAL PROTEIN: 6.9 g/dL (ref 6.5–8.1)

## 2017-08-03 LAB — MAGNESIUM: Magnesium: 1.6 mg/dL — ABNORMAL LOW (ref 1.7–2.4)

## 2017-08-03 NOTE — Progress Notes (Signed)
Here for follow up. Per pt [facility] Contin no effective for pain mgt. abd pain-mid region -5 level today.

## 2017-08-04 LAB — CANCER ANTIGEN 19-9: CA 19-9: 510 U/mL — ABNORMAL HIGH (ref 0–35)

## 2017-08-12 MED FILL — XELODA 500 MG TABLET: 500 | 21 days supply | Qty: 84 | Fill #1

## 2017-08-14 NOTE — Progress Notes (Signed)
[place]  Telephone:([phone] [phone]  ID: Benjamin Diaz. OB: 1960-10-13  MR#: [id number]  GEX#:[id number]  Patient Care Team: Patient, [name] as PCP - General (General Practice) [name], MD as Consulting Physician (Oncology) [name], MD (Inactive) as Consulting Physician (Gastroenterology) [name], MD as Consulting Physician (Gastroenterology)  CHIEF COMPLAINT: Stage IV pancreatic cancer  INTERVAL HISTORY: Patient returns to clinic today for further evaluation and consideration of cycle 2 of CAPEOX.  He continues to have abdominal pain, but states this has improved. He continues to have a poor appetite.  He continues to have chronic weakness and fatigue.  He has no neurologic complaints. He denies any recent fevers or illnesses. He has no chest pain or shortness of breath. He denies any nausea, vomiting, constipation, or diarrhea. He has no urinary complaints.  Patient offers no further specific complaints today.  REVIEW OF SYSTEMS:   Review of Systems  Constitutional: Positive for malaise/fatigue and weight loss. Negative for fever.  Respiratory: Negative.  Negative for cough and shortness of breath.   Cardiovascular: Negative.  Negative for chest pain and leg swelling.  Gastrointestinal: Positive for abdominal pain. Negative for diarrhea, nausea and vomiting.  Genitourinary: Negative.  Negative for dysuria, frequency and urgency.  Musculoskeletal: Negative.  Negative for joint pain.  Skin: Negative.  Negative for itching and rash.  Neurological: Positive for weakness. Negative for sensory change and focal weakness.  Psychiatric/Behavioral: Negative.  The patient is not nervous/anxious.     As per HPI. Otherwise, a complete review of systems is negative.  PAST MEDICAL HISTORY: Past Medical History:  Diagnosis Date  . Arthritis   . Asthma   . Atrial fibrillation ([place])   . Cancer ([place])   . Closed left ankle  fracture age 67  . COPD (chronic obstructive pulmonary disease) ([place])   . Dysrhythmia   . Pneumonia 2011  . Right shoulder injury 09/27/2016  . Sciatica    left leg pinched nerve numb at times about once a year    PAST SURGICAL HISTORY: Past Surgical History:  Procedure Laterality Date  . BILIARY STENT PLACEMENT N/A 09/01/2016   Procedure: BILIARY STENT PLACEMENT;  Surgeon: [name], MD;  Location: [facility];  Service: Endoscopy;  Laterality: N/A;  . DIAGNOSTIC LAPAROSCOPIC LIVER BIOPSY  09/16/2016   Procedure: DIAGNOSTIC LAPAROSCOPIC LIVER BIOPSY;  Surgeon: [name], MD;  Location: [facility];  Service: General;;  . ERCP N/A 07/09/2016   Procedure: ENDOSCOPIC RETROGRADE CHOLANGIOPANCREATOGRAPHY (ERCP);  Surgeon: [name], MD;  Location: [facility];  Service: Endoscopy;  Laterality: N/A;  . ERCP N/A 09/01/2016   Procedure: ENDOSCOPIC RETROGRADE CHOLANGIOPANCREATOGRAPHY (ERCP);  Surgeon: [name], MD;  Location: [facility];  Service: Endoscopy;  Laterality: N/A;  . ERCP N/A 06/28/2017   Procedure: ENDOSCOPIC RETROGRADE CHOLANGIOPANCREATOGRAPHY (ERCP);  Surgeon: [name], MD;  Location[facility];  Service: Endoscopy;  Laterality: N/A;  . ESOPHAGOGASTRODUODENOSCOPY (EGD) WITH PROPOFOL N/A 09/01/2016   Procedure: ESOPHAGOGASTRODUODENOSCOPY (EGD) WITH PROPOFOL;  Surgeon: [name], MD;  [facility];  Service: Gastroenterology;  Laterality: N/A;  . EUS N/A 09/01/2016   Procedure: ESOPHAGEAL ENDOSCOPIC ULTRASOUND (EUS) RADIAL;  Surgeon: [name], MD;  [facility];  Service: Endoscopy;  Laterality: N/A;  . FINE NEEDLE ASPIRATION N/A 09/01/2016   Procedure: FINE NEEDLE ASPIRATION (FNA) RADIAL;  Surgeon: [name], MD;  [facility];  Service: Endoscopy;  Laterality: N/A;  . FOREIGN BODY REMOVAL N/A 09/01/2016   Procedure: FOREIGN BODY  REMOVAL;  Surgeon: [name], MD;  Location: [facility];  Service: Gastroenterology;  Laterality:  N/A;  . KNEE SURGERY Right 2002   arthroscopy  . SHOULDER SURGERY Right 2002    FAMILY HISTORY: Family History  Problem Relation Age of Onset  . Heart disease Father     ADVANCED DIRECTIVES (Y/N):  N  HEALTH MAINTENANCE: Social History   Tobacco Use  . Smoking status: Current Every Day Smoker    Packs/day: 1.00    Years: 40.00    Pack years: 40.00    Types: Cigarettes  . Smokeless tobacco: Never Used  Substance Use Topics  . Alcohol use: Yes    Alcohol/week: 4.2 oz    Types: 7 Cans of beer per week    Comment: last drink 5 weeeks ago  april 2018  . Drug use: No     Colonoscopy:  PAP:  Bone density:  Lipid panel:  Allergies  Allergen Reactions  . No Known Allergies     Current Outpatient Medications  Medication Sig Dispense Refill  . albuterol (PROVENTIL HFA;VENTOLIN HFA) 108 (90 Base) MCG/ACT inhaler Inhale 1-2 puffs into the lungs every 6 (six) hours as needed for wheezing or shortness of breath. 1 Inhaler 6  . capecitabine (XELODA) 500 MG tablet Take 3 tablets (1,500 mg total) by mouth 2 (two) times daily after a meal. Take for 14 days on, then 7 days off. 84 tablet 2  . Ipratropium-Albuterol (COMBIVENT RESPIMAT) 20-100 MCG/ACT AERS respimat Inhale 1 puff into the lungs 4 (four) times daily.    Marland Kitchen morphine (MS CONTIN) 15 MG 12 hr tablet Take 1 tablet (15 mg total) by mouth every 12 (twelve) hours. (Patient not taking: Reported on 07/27/2017) 60 tablet 0  . Oxycodone HCl 10 MG TABS Take 1 tablet (10 mg total) by mouth 2 (two) times daily as needed. (Patient not taking: Reported on 08/17/2017) 60 tablet 0   No current facility-administered medications for this visit.    OBJECTIVE: Vitals:   08/17/17 0906  BP: 111/71  Pulse: 79  Resp: 20  Temp: (!) 96.3 F (35.7 C)     Body mass index is 14.16 kg/m.    ECOG FS:1 - Symptomatic but completely ambulatory  General: Thin, no acute distress. Eyes: Pink conjunctiva, anicteric sclera. Lungs: Clear to auscultation  bilaterally. Heart: Regular rate and rhythm. No rubs, murmurs, or gallops. Abdomen: Soft, nontender, nondistended. No organomegaly noted, normoactive bowel sounds. Musculoskeletal: No edema, cyanosis, or clubbing. Neuro: Alert, answering all questions appropriately. Cranial nerves grossly intact. Skin: No rashes or petechiae noted. Psych: Normal affect.  LAB RESULTS:  Lab Results  Component Value Date   NA 128 (L) 08/17/2017   K 4.8 08/17/2017   CL 92 (L) 08/17/2017   CO2 25 08/17/2017   GLUCOSE 78 08/17/2017   BUN 12 08/17/2017   CREATININE 0.50 (L) 08/17/2017   CALCIUM 9.7 08/17/2017   PROT 7.5 08/17/2017   ALBUMIN 3.4 (L) 08/17/2017   AST 162 (H) 08/17/2017   ALT 253 (H) 08/17/2017   ALKPHOS 1,792 (H) 08/17/2017   BILITOT 2.5 (H) 08/17/2017   GFRNONAA >60 08/17/2017   GFRAA >60 08/17/2017    Lab Results  Component Value Date   WBC 7.2 08/17/2017   NEUTROABS 4.2 08/17/2017   HGB 15.3 08/17/2017   HCT 43.1 08/17/2017   MCV 93.6 08/17/2017   PLT 406 08/17/2017      STUDIES: No results found.  ASSESSMENT: Stage IV pancreatic cancer  PLAN:  1. Stage IV pancreatic cancer: Imaging, pathology, and Op note reviewed independently confirming stage IV disease with multiple peritoneal implants.  CT scan results from July 07, 2017 reviewed independently with mild progression of disease, but no obvious etiology of his increased bilirubin. CA-19-9 continues to trend up and is now 491.  Patient states he does not like taking so many pills and wishes to discontinue capcitabine.  He has agreed to complete cycle 2 of CAPEOX and will receive oxaliplatin today and then take 14 days of capcitabine.  Patient has finally agreed to have a port placed, therefore when he returns to clinic in 3 weeks will proceed with modified FOLFIRINOX (oxaliplatin 85 mg/m, leucovorin 400 mg/m, irinotecan 150 mg/m, 5-FU 2400 mg/m over 46 hours) every 2 weeks. 2.  Elevated liver enzymes: AST and ALT  are elevated, but trending down.  Patient has follow-up with GI in early July.   3. Hyponatremia: Patient's sodium is decreased, but approximately his baseline. 4. Peripheral edema: Continue elevation nightly.  Lower extremity ultrasound did not reveal DVT.   5.  Elevated bilirubin: Slowly trending down.  Referral to GI as above. 6.  Pain: Continue MS Contin every 12 hours and oxycodone as needed.  Approximately 30 minutes was spent in discussion of which greater than 50% was consultation.   Patient expressed understanding and was in agreement with this plan. He also understands that He can call clinic at any time with any questions, concerns, or complaints.   Cancer Staging Pancreatic cancer [facility]) Staging form: Exocrine Pancreas, AJCC 8th Edition - Clinical stage from 09/25/2016: Stage IV (cTX, cNX, pM1) - Signed by Lloyd Huger, MD on 09/25/2016   Lloyd Huger, MD   08/20/2017 4:42 PM

## 2017-08-16 ENCOUNTER — Other Ambulatory Visit: Payer: Self-pay

## 2017-08-16 DIAGNOSIS — C250 Malignant neoplasm of head of pancreas: Secondary | ICD-10-CM

## 2017-08-17 ENCOUNTER — Inpatient Hospital Stay: Payer: Medicaid Other

## 2017-08-17 ENCOUNTER — Inpatient Hospital Stay (HOSPITAL_BASED_OUTPATIENT_CLINIC_OR_DEPARTMENT_OTHER): Payer: Medicaid Other | Admitting: Oncology

## 2017-08-17 ENCOUNTER — Encounter: Payer: Self-pay | Admitting: Oncology

## 2017-08-17 ENCOUNTER — Other Ambulatory Visit: Payer: Self-pay

## 2017-08-17 VITALS — BP 111/71 | HR 79 | Temp 96.3°F | Resp 20 | Wt 104.4 lb

## 2017-08-17 DIAGNOSIS — R600 Localized edema: Secondary | ICD-10-CM | POA: Diagnosis not present

## 2017-08-17 DIAGNOSIS — C259 Malignant neoplasm of pancreas, unspecified: Secondary | ICD-10-CM

## 2017-08-17 DIAGNOSIS — Z5111 Encounter for antineoplastic chemotherapy: Secondary | ICD-10-CM | POA: Diagnosis not present

## 2017-08-17 DIAGNOSIS — R109 Unspecified abdominal pain: Secondary | ICD-10-CM

## 2017-08-17 DIAGNOSIS — R740 Nonspecific elevation of levels of transaminase and lactic acid dehydrogenase [LDH]: Secondary | ICD-10-CM | POA: Diagnosis not present

## 2017-08-17 DIAGNOSIS — E871 Hypo-osmolality and hyponatremia: Secondary | ICD-10-CM

## 2017-08-17 DIAGNOSIS — R634 Abnormal weight loss: Secondary | ICD-10-CM | POA: Diagnosis not present

## 2017-08-17 DIAGNOSIS — R531 Weakness: Secondary | ICD-10-CM

## 2017-08-17 DIAGNOSIS — R5382 Chronic fatigue, unspecified: Secondary | ICD-10-CM

## 2017-08-17 LAB — CBC WITH DIFFERENTIAL/PLATELET
BASOS ABS: 0.1 10*3/uL (ref 0–0.1)
Basophils Relative: 1 %
EOS PCT: 7 %
Eosinophils Absolute: 0.5 10*3/uL (ref 0–0.7)
HEMATOCRIT: 43.1 % (ref 40.0–52.0)
Hemoglobin: 15.3 g/dL (ref 13.0–18.0)
LYMPHS ABS: 1.7 10*3/uL (ref 1.0–3.6)
LYMPHS PCT: 23 %
MCH: 33.2 pg (ref 26.0–34.0)
MCHC: 35.4 g/dL (ref 32.0–36.0)
MCV: 93.6 fL (ref 80.0–100.0)
MONO ABS: 0.8 10*3/uL (ref 0.2–1.0)
Monocytes Relative: 11 %
NEUTROS ABS: 4.2 10*3/uL (ref 1.4–6.5)
NEUTROS PCT: 58 %
PLATELETS: 406 10*3/uL (ref 150–440)
RBC: 4.61 MIL/uL (ref 4.40–5.90)
RDW: 15.1 % — AB (ref 11.5–14.5)
WBC: 7.2 10*3/uL (ref 3.8–10.6)

## 2017-08-17 LAB — COMPREHENSIVE METABOLIC PANEL
ALK PHOS: 1792 U/L — AB (ref 38–126)
ALT: 253 U/L — ABNORMAL HIGH (ref 17–63)
ANION GAP: 11 (ref 5–15)
AST: 162 U/L — ABNORMAL HIGH (ref 15–41)
Albumin: 3.4 g/dL — ABNORMAL LOW (ref 3.5–5.0)
BUN: 12 mg/dL (ref 6–20)
CALCIUM: 9.7 mg/dL (ref 8.9–10.3)
CO2: 25 mmol/L (ref 22–32)
Chloride: 92 mmol/L — ABNORMAL LOW (ref 101–111)
Creatinine, Ser: 0.5 mg/dL — ABNORMAL LOW (ref 0.61–1.24)
GFR calc non Af Amer: >60 mL/min (ref >60)
Glucose, Bld: 78 mg/dL (ref 65–99)
Potassium: 4.8 mmol/L (ref 3.5–5.1)
Sodium: 128 mmol/L — ABNORMAL LOW (ref 135–145)
Total Bilirubin: 2.5 mg/dL — ABNORMAL HIGH (ref 0.3–1.2)
Total Protein: 7.5 g/dL (ref 6.5–8.1)

## 2017-08-17 LAB — MAGNESIUM: Magnesium: 1.7 mg/dL (ref 1.7–2.4)

## 2017-08-17 MED ORDER — DEXTROSE 5 % IV SOLN
Freq: Once | INTRAVENOUS | Status: AC
  Administered 2017-08-17: 10:00:00 via INTRAVENOUS
  Filled 2017-08-17: qty 1000

## 2017-08-17 MED ORDER — SODIUM CHLORIDE 0.9 % IV SOLN: 10.0000 mg | Freq: Once | INTRAVENOUS | Status: DC

## 2017-08-17 MED ORDER — PALONOSETRON HCL INJECTION 0.25 MG/5ML
0.2500 mg | Freq: Once | INTRAVENOUS | Status: AC
  Administered 2017-08-17: 0.25 mg via INTRAVENOUS
  Filled 2017-08-17: qty 5

## 2017-08-17 MED ORDER — DEXAMETHASONE SODIUM PHOSPHATE 10 MG/ML IJ SOLN
10.0000 mg | Freq: Once | INTRAMUSCULAR | Status: AC
  Administered 2017-08-17: 10 mg via INTRAVENOUS
  Filled 2017-08-17: qty 1

## 2017-08-17 MED ORDER — OXALIPLATIN CHEMO INJECTION 100 MG/20ML
150.0000 mg | Freq: Once | INTRAVENOUS | Status: AC
  Administered 2017-08-17: 150 mg via INTRAVENOUS
  Filled 2017-08-17: qty 10

## 2017-08-17 NOTE — Progress Notes (Signed)
Patient reports pain is improved, rates abdominal pain 3/10 this morning.

## 2017-08-18 LAB — CANCER ANTIGEN 19-9: CA 19-9: 491 U/mL — ABNORMAL HIGH (ref 0–35)

## 2017-08-20 MED ORDER — LOPERAMIDE HCL 2 MG PO TABS: 2.0000 mg | ORAL_TABLET | Freq: Three times a day (TID) | ORAL | 1 refills | Status: DC | PRN

## 2017-08-20 MED ORDER — LIDOCAINE-PRILOCAINE 2.5-2.5 % EX CREA: TOPICAL_CREAM | CUTANEOUS | 3 refills | Status: DC

## 2017-08-26 ENCOUNTER — Telehealth: Payer: Self-pay | Admitting: *Deleted

## 2017-08-26 NOTE — Telephone Encounter (Signed)
na

## 2017-08-31 ENCOUNTER — Telehealth (INDEPENDENT_AMBULATORY_CARE_PROVIDER_SITE_OTHER): Payer: Self-pay

## 2017-08-31 NOTE — Telephone Encounter (Signed)
I have attempted to contact the patient again to schedule his port placement and left a message for a return call. I spoke with the patient last week and he stated he would speak with Dr. Grayland Ormond regarding transportation and call back.

## 2017-09-01 MED FILL — XELODA 500 MG TABLET: 500 | 21 days supply | Qty: 84 | Fill #2

## 2017-09-04 NOTE — Progress Notes (Signed)
Benjamin Diaz  Telephone:([phone] [phone]  ID: Benjamin Diaz. OB: December 24, 1960  MR#: [id number]  SEG#:[id number]  Patient Care Team: Patient, [name] as PCP - General (General Practice) Benjamin Napoleon, MD as Consulting Physician (Oncology) Benjamin Irani, MD (Inactive) as Consulting Physician (Gastroenterology) Benjamin Silence, MD as Consulting Physician (Gastroenterology)  CHIEF COMPLAINT: Stage IV pancreatic cancer  INTERVAL HISTORY: Patient returns to clinic today for further evaluation and consideration of cycle 3 of CAPEOX.  He was unable to get transportation for his port placement, therefore could not initiate FOLFIRINOX as planned.  He initiated capecitabine this morning.  He continues to have abdominal pain, but states this has improved. He continues to have a poor appetite, but his weight has improved.  He continues to have chronic weakness and fatigue.  He has no neurologic complaints. He denies any recent fevers or illnesses. He has no chest pain or shortness of breath. He denies any nausea, vomiting, constipation, or diarrhea. He has no urinary complaints.  Patient offers no further specific complaints today.  REVIEW OF SYSTEMS:   Review of Systems  Constitutional: Positive for malaise/fatigue. Negative for fever and weight loss.  Respiratory: Negative.  Negative for cough and shortness of breath.   Cardiovascular: Negative.  Negative for chest pain and leg swelling.  Gastrointestinal: Positive for abdominal pain. Negative for diarrhea, nausea and vomiting.  Genitourinary: Negative.  Negative for dysuria, frequency and urgency.  Musculoskeletal: Negative.  Negative for joint pain.  Skin: Negative.  Negative for itching and rash.  Neurological: Positive for weakness. Negative for sensory change and focal weakness.  Psychiatric/Behavioral: Negative.  The patient is not nervous/anxious.     As per HPI. Otherwise, a complete review of  systems is negative.  PAST MEDICAL HISTORY: Past Medical History:  Diagnosis Date  . Arthritis   . Asthma   . Atrial fibrillation ([place])   . Cancer ([place])   . Closed left ankle fracture age 57  . COPD (chronic obstructive pulmonary disease) ([place])   . Dysrhythmia   . Pneumonia 2011  . Right shoulder injury 09/27/2016  . Sciatica    left leg pinched nerve numb at times about once a year    PAST SURGICAL HISTORY: Past Surgical History:  Procedure Laterality Date  . BILIARY STENT PLACEMENT N/A 09/01/2016   Procedure: BILIARY STENT PLACEMENT;  Surgeon: Benjamin Silence, MD;  [facility];  Service: Endoscopy;  Laterality: N/A;  . DIAGNOSTIC LAPAROSCOPIC LIVER BIOPSY  09/16/2016   Procedure: DIAGNOSTIC LAPAROSCOPIC LIVER BIOPSY;  Surgeon: Benjamin Klein, MD;  Location: [place];  Service: General;;  . ERCP N/A 07/09/2016   Procedure: ENDOSCOPIC RETROGRADE CHOLANGIOPANCREATOGRAPHY (ERCP);  Surgeon: Benjamin Irani, MD;  Location: [facility];  Service: Endoscopy;  Laterality: N/A;  . ERCP N/A 09/01/2016   Procedure: ENDOSCOPIC RETROGRADE CHOLANGIOPANCREATOGRAPHY (ERCP);  Surgeon: Benjamin Silence, MD;  Location: [facility];  Service: Endoscopy;  Laterality: N/A;  . ERCP N/A 06/28/2017   Procedure: ENDOSCOPIC RETROGRADE CHOLANGIOPANCREATOGRAPHY (ERCP);  Surgeon: Benjamin Lame, MD;  Location: [facility] ENDOSCOPY;  Service: Endoscopy;  Laterality: N/A;  . ESOPHAGOGASTRODUODENOSCOPY (EGD) WITH PROPOFOL N/A 09/01/2016   Procedure: ESOPHAGOGASTRODUODENOSCOPY (EGD) WITH PROPOFOL;  Surgeon: Benjamin Juniper, MD;  [facility];  Service: Gastroenterology;  Laterality: N/A;  . EUS N/A 09/01/2016   Procedure: ESOPHAGEAL ENDOSCOPIC ULTRASOUND (EUS) RADIAL;  Surgeon: Benjamin Silence, MD;  Location: [facility];  Service: Endoscopy;  Laterality: N/A;  . FINE NEEDLE ASPIRATION N/A 09/01/2016   Procedure: FINE NEEDLE ASPIRATION (  FNA) RADIAL;  Surgeon: Benjamin Silence, MD;  [facility];  Service: Endoscopy;   Laterality: N/A;  . FOREIGN BODY REMOVAL N/A 09/01/2016   Procedure: FOREIGN BODY REMOVAL;  Surgeon: Benjamin Juniper, MD;  [facility];  Service: Gastroenterology;  Laterality: N/A;  . KNEE SURGERY Right 2002   arthroscopy  . SHOULDER SURGERY Right 2002    FAMILY HISTORY: Family History  Problem Relation Age of Onset  . Heart disease Father     ADVANCED DIRECTIVES (Y/N):  N  HEALTH MAINTENANCE: Social History   Tobacco Use  . Smoking status: Current Every Day Smoker    Packs/day: 1.00    Years: 40.00    Pack years: 40.00    Types: Cigarettes  . Smokeless tobacco: Never Used  Substance Use Topics  . Alcohol use: Yes    Alcohol/week: 4.2 oz    Types: 7 Cans of beer per week    Comment: last drink 5 weeeks ago  april 2018  . Drug use: No     Colonoscopy:  PAP:  Bone density:  Lipid panel:  Allergies  Allergen Reactions  . No Known Allergies     Current Outpatient Medications  Medication Sig Dispense Refill  . albuterol (PROVENTIL HFA;VENTOLIN HFA) 108 (90 Base) MCG/ACT inhaler Inhale 1-2 puffs into the lungs every 6 (six) hours as needed for wheezing or shortness of breath. 1 Inhaler 6  . capecitabine (XELODA) 500 MG tablet Take 3 tablets (1,500 mg total) by mouth 2 (two) times daily after a meal. Take for 14 days on, then 7 days off. 84 tablet 2  . Ipratropium-Albuterol (COMBIVENT RESPIMAT) 20-100 MCG/ACT AERS respimat Inhale 1 puff into the lungs 4 (four) times daily.    Marland Kitchen morphine (MS CONTIN) 15 MG 12 hr tablet Take 1 tablet (15 mg total) by mouth every 12 (twelve) hours. (Patient not taking: Reported on 07/27/2017) 60 tablet 0  . Oxycodone HCl 10 MG TABS Take 1 tablet (10 mg total) by mouth 2 (two) times daily as needed. (Patient not taking: Reported on 08/17/2017) 60 tablet 0   No current facility-administered medications for this visit.    OBJECTIVE: Vitals:   09/07/17 0859  BP: 116/74  Pulse: 74  Resp: 18  Temp: (!) 97.4 F (36.3 C)     Body mass  index is 14.65 kg/m.    ECOG FS:1 - Symptomatic but completely ambulatory  General: Thin, no acute distress. Eyes: Pink conjunctiva, anicteric sclera. Lungs: Clear to auscultation bilaterally. Heart: Regular rate and rhythm. No rubs, murmurs, or gallops. Abdomen: Soft, nontender, nondistended. No organomegaly noted, normoactive bowel sounds. Musculoskeletal: No edema, cyanosis, or clubbing. Neuro: Alert, answering all questions appropriately. Cranial nerves grossly intact. Skin: No rashes or petechiae noted. Psych: Normal affect.  LAB RESULTS:  Lab Results  Component Value Date   NA 129 (L) 09/07/2017   K 4.7 09/07/2017   CL 93 (L) 09/07/2017   CO2 27 09/07/2017   GLUCOSE 95 09/07/2017   BUN 13 09/07/2017   CREATININE 0.39 (L) 09/07/2017   CALCIUM 9.5 09/07/2017   PROT 7.4 09/07/2017   ALBUMIN 3.4 (L) 09/07/2017   AST 256 (H) 09/07/2017   ALT 342 (H) 09/07/2017   ALKPHOS 2,061 (H) 09/07/2017   BILITOT 1.6 (H) 09/07/2017   GFRNONAA >60 09/07/2017   GFRAA >60 09/07/2017    Lab Results  Component Value Date   WBC 7.4 09/07/2017   NEUTROABS 4.3 09/07/2017   HGB 15.1 09/07/2017   HCT 42.3 09/07/2017  MCV 91.7 09/07/2017   PLT 379 09/07/2017      STUDIES: No results found.  ASSESSMENT: Stage IV pancreatic cancer  PLAN:   1. Stage IV pancreatic cancer: Imaging, pathology, and Op note reviewed independently confirming stage IV disease with multiple peritoneal implants.  CT scan results from July 07, 2017 reviewed independently with mild progression of disease, but no obvious etiology of his increased bilirubin.  CA-19-9 is now trending down to 333.  Patient had difficulty obtaining transportation for port placement, therefore we will proceed with cycle 3 of CAPEOX and will receive oxaliplatin today and 14 days of capecitabine.  Patient has difficult venous access, and likely will require port anyway.  Can always reconsider modified FOLFIRINOX (oxaliplatin 85 mg/m,  leucovorin 400 mg/m, irinotecan 150 mg/m, 5-FU 2400 mg/m over 46 hours) every 2 weeks in the future.  Return to clinic in 3 weeks for further evaluation and consideration of cycle 4. 2.  Elevated liver enzymes: AST and ALT are slightly worse today.  Proceed with treatment as above.  Continue follow-up with GI in early July.  3. Hyponatremia: Patient's sodium is decreased, but approximately his baseline. 4. Peripheral edema: Continue elevation nightly.  Lower extremity ultrasound did not reveal DVT.   5.  Elevated bilirubin: Slowly trending down.  Follow-up with GI as above. 6.  Pain: Continue MS Contin every 12 hours and oxycodone as needed.  Approximately 30 minutes was spent in discussion of which greater than 50% was consultation.   Patient expressed understanding and was in agreement with this plan. He also understands that He can call clinic at any time with any questions, concerns, or complaints.   Cancer Staging Pancreatic cancer [facility]) Staging form: Exocrine Pancreas, AJCC 8th Edition - Clinical stage from 09/25/2016: Stage IV (cTX, cNX, pM1) - Signed by Lloyd Huger, MD on 09/25/2016   Lloyd Huger, MD   09/10/2017 7:23 AM

## 2017-09-07 ENCOUNTER — Inpatient Hospital Stay (HOSPITAL_BASED_OUTPATIENT_CLINIC_OR_DEPARTMENT_OTHER): Payer: Medicaid Other | Admitting: Oncology

## 2017-09-07 ENCOUNTER — Encounter: Payer: Self-pay | Admitting: Oncology

## 2017-09-07 ENCOUNTER — Inpatient Hospital Stay: Payer: Medicaid Other | Attending: Oncology

## 2017-09-07 ENCOUNTER — Inpatient Hospital Stay: Payer: Medicaid Other

## 2017-09-07 ENCOUNTER — Other Ambulatory Visit: Payer: Self-pay

## 2017-09-07 VITALS — BP 116/74 | HR 74 | Temp 97.4°F | Resp 18 | Wt 108.0 lb

## 2017-09-07 DIAGNOSIS — Z5111 Encounter for antineoplastic chemotherapy: Secondary | ICD-10-CM | POA: Insufficient documentation

## 2017-09-07 DIAGNOSIS — C250 Malignant neoplasm of head of pancreas: Secondary | ICD-10-CM | POA: Insufficient documentation

## 2017-09-07 DIAGNOSIS — R109 Unspecified abdominal pain: Secondary | ICD-10-CM | POA: Diagnosis not present

## 2017-09-07 DIAGNOSIS — R5383 Other fatigue: Secondary | ICD-10-CM

## 2017-09-07 DIAGNOSIS — R600 Localized edema: Secondary | ICD-10-CM | POA: Diagnosis not present

## 2017-09-07 DIAGNOSIS — E871 Hypo-osmolality and hyponatremia: Secondary | ICD-10-CM | POA: Diagnosis not present

## 2017-09-07 DIAGNOSIS — R740 Nonspecific elevation of levels of transaminase and lactic acid dehydrogenase [LDH]: Secondary | ICD-10-CM

## 2017-09-07 DIAGNOSIS — F17210 Nicotine dependence, cigarettes, uncomplicated: Secondary | ICD-10-CM

## 2017-09-07 DIAGNOSIS — C259 Malignant neoplasm of pancreas, unspecified: Secondary | ICD-10-CM

## 2017-09-07 DIAGNOSIS — R531 Weakness: Secondary | ICD-10-CM

## 2017-09-07 DIAGNOSIS — R5382 Chronic fatigue, unspecified: Secondary | ICD-10-CM | POA: Diagnosis not present

## 2017-09-07 LAB — COMPREHENSIVE METABOLIC PANEL
ALBUMIN: 3.4 g/dL — AB (ref 3.5–5.0)
ALK PHOS: 2061 U/L — AB (ref 38–126)
ALT: 342 U/L — AB (ref 17–63)
AST: 256 U/L — AB (ref 15–41)
Anion gap: 9 (ref 5–15)
BUN: 13 mg/dL (ref 6–20)
CALCIUM: 9.5 mg/dL (ref 8.9–10.3)
CHLORIDE: 93 mmol/L — AB (ref 101–111)
CO2: 27 mmol/L (ref 22–32)
CREATININE: 0.39 mg/dL — AB (ref 0.61–1.24)
GFR calc non Af Amer: >60 mL/min (ref >60)
GLUCOSE: 95 mg/dL (ref 65–99)
Potassium: 4.7 mmol/L (ref 3.5–5.1)
SODIUM: 129 mmol/L — AB (ref 135–145)
Total Bilirubin: 1.6 mg/dL — ABNORMAL HIGH (ref 0.3–1.2)
Total Protein: 7.4 g/dL (ref 6.5–8.1)

## 2017-09-07 LAB — CBC WITH DIFFERENTIAL/PLATELET
Basophils Absolute: 0.1 K/uL (ref 0–0.1)
Basophils Relative: 1 %
Eosinophils Absolute: 0.6 K/uL (ref 0–0.7)
Eosinophils Relative: 8 %
HCT: 42.3 % (ref 40.0–52.0)
Hemoglobin: 15.1 g/dL (ref 13.0–18.0)
Lymphocytes Relative: 22 %
Lymphs Abs: 1.7 K/uL (ref 1.0–3.6)
MCH: 32.7 pg (ref 26.0–34.0)
MCHC: 35.6 g/dL (ref 32.0–36.0)
MCV: 91.7 fL (ref 80.0–100.0)
Monocytes Absolute: 0.8 K/uL (ref 0.2–1.0)
Monocytes Relative: 11 %
Neutro Abs: 4.3 K/uL (ref 1.4–6.5)
Neutrophils Relative %: 58 %
Platelets: 379 K/uL (ref 150–440)
RBC: 4.62 MIL/uL (ref 4.40–5.90)
RDW: 16 % — ABNORMAL HIGH (ref 11.5–14.5)
WBC: 7.4 K/uL (ref 3.8–10.6)

## 2017-09-07 LAB — MAGNESIUM: Magnesium: 1.8 mg/dL (ref 1.7–2.4)

## 2017-09-07 MED ORDER — SODIUM CHLORIDE 0.9 % IV SOLN: 10.0000 mg | Freq: Once | INTRAVENOUS | Status: DC

## 2017-09-07 MED ORDER — PALONOSETRON HCL INJECTION 0.25 MG/5ML
0.2500 mg | Freq: Once | INTRAVENOUS | Status: AC
  Administered 2017-09-07: 0.25 mg via INTRAVENOUS
  Filled 2017-09-07: qty 5

## 2017-09-07 MED ORDER — DEXAMETHASONE SODIUM PHOSPHATE 10 MG/ML IJ SOLN
10.0000 mg | Freq: Once | INTRAMUSCULAR | Status: AC
  Administered 2017-09-07: 10 mg via INTRAVENOUS
  Filled 2017-09-07: qty 1

## 2017-09-07 MED ORDER — DEXAMETHASONE SODIUM PHOSPHATE 4 MG/ML IJ SOLN
10.0000 mg | Freq: Once | INTRAMUSCULAR | Status: DC
  Filled 2017-09-07: qty 3

## 2017-09-07 MED ORDER — DEXTROSE 5 % IV SOLN
Freq: Once | INTRAVENOUS | Status: AC
  Administered 2017-09-07: 10:00:00 via INTRAVENOUS
  Filled 2017-09-07: qty 1000

## 2017-09-07 MED ORDER — OXALIPLATIN CHEMO INJECTION 100 MG/20ML
150.0000 mg | Freq: Once | INTRAVENOUS | Status: AC
  Administered 2017-09-07: 150 mg via INTRAVENOUS
  Filled 2017-09-07: qty 20

## 2017-09-07 NOTE — Progress Notes (Signed)
Pt in for follow up. Reports he did not get port a cath because he didn't have anyone to go with him and it was required.  Weight up 4 lbs.

## 2017-09-07 NOTE — Progress Notes (Signed)
Patient asked to stop today's treatment due to pain in his arm from treatment.   Argentina Donovan, RN will be scheduling patient for port placement.  Patient understands that he will not be able to get his treatment without a port.

## 2017-09-08 ENCOUNTER — Encounter (INDEPENDENT_AMBULATORY_CARE_PROVIDER_SITE_OTHER): Payer: Self-pay

## 2017-09-08 LAB — CANCER ANTIGEN 19-9: CAN 19-9: 333 U/mL — AB (ref 0–35)

## 2017-09-12 ENCOUNTER — Other Ambulatory Visit (INDEPENDENT_AMBULATORY_CARE_PROVIDER_SITE_OTHER): Payer: Self-pay | Admitting: Vascular Surgery

## 2017-09-19 ENCOUNTER — Other Ambulatory Visit: Payer: Self-pay | Admitting: Oncology

## 2017-09-19 DIAGNOSIS — C259 Malignant neoplasm of pancreas, unspecified: Secondary | ICD-10-CM

## 2017-09-20 ENCOUNTER — Ambulatory Visit
Admission: RE | Admit: 2017-09-20 | Discharge: 2017-09-20 | Disposition: A | Discharge status: Home / Self Care | Payer: Medicaid Other | Source: Ambulatory Visit | Attending: Vascular Surgery | Admitting: Vascular Surgery

## 2017-09-20 ENCOUNTER — Encounter
Admission: RE | Disposition: A | Discharge status: Home / Self Care | Payer: Self-pay | Source: Ambulatory Visit | Attending: Vascular Surgery

## 2017-09-20 DIAGNOSIS — F17210 Nicotine dependence, cigarettes, uncomplicated: Secondary | ICD-10-CM | POA: Insufficient documentation

## 2017-09-20 DIAGNOSIS — Z79899 Other long term (current) drug therapy: Secondary | ICD-10-CM | POA: Insufficient documentation

## 2017-09-20 DIAGNOSIS — J449 Chronic obstructive pulmonary disease, unspecified: Secondary | ICD-10-CM | POA: Diagnosis not present

## 2017-09-20 DIAGNOSIS — J45909 Unspecified asthma, uncomplicated: Secondary | ICD-10-CM | POA: Diagnosis not present

## 2017-09-20 DIAGNOSIS — I499 Cardiac arrhythmia, unspecified: Secondary | ICD-10-CM | POA: Diagnosis not present

## 2017-09-20 DIAGNOSIS — Z95820 Peripheral vascular angioplasty status with implants and grafts: Secondary | ICD-10-CM | POA: Insufficient documentation

## 2017-09-20 DIAGNOSIS — Z9889 Other specified postprocedural states: Secondary | ICD-10-CM | POA: Diagnosis not present

## 2017-09-20 DIAGNOSIS — R600 Localized edema: Secondary | ICD-10-CM | POA: Insufficient documentation

## 2017-09-20 DIAGNOSIS — C259 Malignant neoplasm of pancreas, unspecified: Secondary | ICD-10-CM | POA: Diagnosis present

## 2017-09-20 DIAGNOSIS — Z8249 Family history of ischemic heart disease and other diseases of the circulatory system: Secondary | ICD-10-CM | POA: Diagnosis not present

## 2017-09-20 DIAGNOSIS — G893 Neoplasm related pain (acute) (chronic): Secondary | ICD-10-CM | POA: Insufficient documentation

## 2017-09-20 DIAGNOSIS — I4891 Unspecified atrial fibrillation: Secondary | ICD-10-CM | POA: Insufficient documentation

## 2017-09-20 DIAGNOSIS — M1990 Unspecified osteoarthritis, unspecified site: Secondary | ICD-10-CM | POA: Diagnosis not present

## 2017-09-20 HISTORY — PX: PORTA CATH INSERTION: CATH118285

## 2017-09-20 SURGERY — PORTA CATH INSERTION
Anesthesia: Moderate Sedation

## 2017-09-20 MED ORDER — CEFAZOLIN SODIUM-DEXTROSE 2-4 GM/100ML-% IV SOLN
2.0000 g | Freq: Once | INTRAVENOUS | Status: AC
  Administered 2017-09-20: 2 g via INTRAVENOUS

## 2017-09-20 MED ORDER — SODIUM CHLORIDE 0.9 % IV SOLN
Freq: Once | INTRAVENOUS | Status: DC
  Filled 2017-09-20: qty 2

## 2017-09-20 MED ORDER — HEPARIN (PORCINE) IN NACL 1000-0.9 UT/500ML-% IV SOLN
INTRAVENOUS | Status: AC
  Filled 2017-09-20: qty 500

## 2017-09-20 MED ORDER — MIDAZOLAM HCL 5 MG/5ML IJ SOLN
INTRAMUSCULAR | Status: AC
Start: 2017-09-20 — End: ?
  Filled 2017-09-20: qty 5

## 2017-09-20 MED ORDER — CEFAZOLIN SODIUM-DEXTROSE 2-4 GM/100ML-% IV SOLN
INTRAVENOUS | Status: AC
  Administered 2017-09-20: 2 g via INTRAVENOUS
  Filled 2017-09-20: qty 100

## 2017-09-20 MED ORDER — MIDAZOLAM HCL 2 MG/2ML IJ SOLN
INTRAMUSCULAR | Status: DC | PRN
  Administered 2017-09-20: 0.5 mg via INTRAVENOUS
  Administered 2017-09-20: 2 mg via INTRAVENOUS
  Administered 2017-09-20: 0.5 mg via INTRAVENOUS

## 2017-09-20 MED ORDER — SODIUM CHLORIDE 0.9 % IV SOLN
INTRAVENOUS | Status: DC
  Administered 2017-09-20: 09:00:00 via INTRAVENOUS

## 2017-09-20 MED ORDER — LIDOCAINE-EPINEPHRINE (PF) 1 %-1:200000 IJ SOLN
INTRAMUSCULAR | Status: AC
  Filled 2017-09-20: qty 30

## 2017-09-20 MED ORDER — FENTANYL CITRATE (PF) 100 MCG/2ML IJ SOLN
INTRAMUSCULAR | Status: AC
  Filled 2017-09-20: qty 2

## 2017-09-20 MED ORDER — HYDROMORPHONE HCL 1 MG/ML IJ SOLN: 1.0000 mg | Freq: Once | INTRAMUSCULAR | Status: DC | PRN

## 2017-09-20 MED ORDER — FENTANYL CITRATE (PF) 100 MCG/2ML IJ SOLN
INTRAMUSCULAR | Status: DC | PRN
  Administered 2017-09-20: 50 ug via INTRAVENOUS
  Administered 2017-09-20 (×2): 25 ug via INTRAVENOUS

## 2017-09-20 MED ORDER — ONDANSETRON HCL 4 MG/2ML IJ SOLN: 4.0000 mg | Freq: Four times a day (QID) | INTRAMUSCULAR | Status: DC | PRN

## 2017-09-20 SURGICAL SUPPLY — 11 items
DERMABOND ADVANCED (GAUZE/BANDAGES/DRESSINGS) ×2
DERMABOND ADVANCED .7 DNX12 (GAUZE/BANDAGES/DRESSINGS) ×1 IMPLANT
DRAPE INCISE IOBAN 66X45 STRL (DRAPES) ×3 IMPLANT
KIT PORT POWER 8FR ISP CVUE (Port) ×3 IMPLANT
NEEDLE ENTRY 21GA 7CM ECHOTIP (NEEDLE) ×3 IMPLANT
PACK ANGIOGRAPHY (CUSTOM PROCEDURE TRAY) ×3 IMPLANT
SET INTRO CAPELLA COAXIAL (SET/KITS/TRAYS/PACK) ×3 IMPLANT
SUT MNCRL AB 4-0 PS2 18 (SUTURE) ×3 IMPLANT
SUT PROLENE 0 CT 1 30 (SUTURE) ×3 IMPLANT
SUT VIC AB 3-0 SH 27 (SUTURE) ×2
SUT VIC AB 3-0 SH 27X BRD (SUTURE) ×1 IMPLANT

## 2017-09-20 NOTE — H&P (Signed)
[facility] History & Physical Update  The patient was interviewed and re-examined.  The patient's previous History and Physical has been reviewed and is unchanged.  The patient has stage IV pancreatic cancer and will require chemotherapy.  There is no change in the plan of care. We plan to proceed with the scheduled procedure.  Hortencia Pilar, MD  09/20/2017, 9:06 AM

## 2017-09-20 NOTE — Op Note (Signed)
OPERATIVE NOTE   PROCEDURE: 1. Placement of a right IJ Infuse-a-Port  PRE-OPERATIVE DIAGNOSIS: Pancreatic CA  POST-OPERATIVE DIAGNOSIS: Same  SURGEON: [name] M.D.  ANESTHESIA: Conscious sedation was administered under my direct supervision by the interventional radiology RN. IV Versed plus fentanyl were utilized. Continuous ECG, pulse oximetry and blood pressure was monitored throughout the entire procedure. Conscious sedation was for a total of 38 minutes.  ESTIMATED BLOOD LOSS: Minimal   FINDING(S): 1.  Patent vein  SPECIMEN(S): None  INDICATIONS:   Benjamin Diaz. is a 57 y.o. male who presents with pancreatic CA and will be under going chemotherapy and therefore needs appropriate IV access.Risk and benefits were reviewed the patient.  Indications for the procedure were reviewed.  All questions were answered, the patient agrees to proceed.   DESCRIPTION: After obtaining full informed written consent, the patient was brought back to the special procedure suite and placed in the supine position. The patient's right neck and chest wall are prepped and draped in sterile fashion. Appropriate timeout was called.  Ultrasound is placed in a sterile sleeve, ultrasound is utilized to avoid vascular injury as well as secondary to lack of appropriate landmarks. The right IJ vein is identified. It is echolucent and homogeneous as well as easily compressible indicating patency. An image is recorded for the permanent record.  Access to the vein with a micropuncture needle is done under direct ultrasound visualization.  1% lidocaine is infiltrated into the soft tissue at the base of the neck as well as on the chest wall.  Under direct ultrasound visualization a micro-needle is inserted into the vein followed by the micro-wire. Micro-sheath was then advanced and a J wire is inserted without difficulty under fluoroscopic guidance. A small counterincision was created at the wire  insertion site. A transverse incision is created 2 fingerbreadths below the scapula and a pocket is fashioned using both blunt and sharp dissection. The pocket is tested for appropriate size with the hub of the Infuse-a-Port. The tunneling device is then used to pull the intravascular portion of the catheter from the pocket to the neck counterincision.  Dilator and peel-away sheath were then inserted over the wire and the wire is removed. Catheter is then advanced into the venous system without difficulty. Peel-away sheath was then removed.  Catheter is then positioned under fluoroscopic guidance at the atrial caval junction. It is then transected connected to the hub and the hope is slipped into the subcutaneous pocket on the chest wall. The hub was then accessed percutaneously and aspirates easily and flushes well and is flushed with 30 cc of heparinized saline. The pocket incision is then closed in layers using interrupted 3-0 Vicryl for the subcutaneous tissues and 4-0 Monocryl subcuticular for skin closure. Dermabond is applied. The neck counterincision was closed with 4-0 Monocryl subcuticular and Dermabond as well.  The patient tolerated the procedure well and there were no immediate complications.  COMPLICATIONS: None  CONDITION: Unchanged  [name] M.D.  vein and vascular Office: [phone]   09/20/2017, 11:58 AM

## 2017-09-20 NOTE — Discharge Instructions (Signed)
Implanted Port Insertion, Care After °This sheet gives you information about how to care for yourself after your procedure. Your health care provider may also give you more specific instructions. If you have problems or questions, contact your health care provider. °What can I expect after the procedure? °After your procedure, it is common to have: °· Discomfort at the port insertion site. °· Bruising on the skin over the port. This should improve over 3-4 days. ° °Follow these instructions at home: °Port care °· After your port is placed, you will get a manufacturer's information card. The card has information about your port. Keep this card with you at all times. °· Take care of the port as told by your health care provider. Ask your health care provider if you or a family member can get training for taking care of the port at home. A home health care nurse may also take care of the port. °· Make sure to remember what type of port you have. °Incision care °· Follow instructions from your health care provider about how to take care of your port insertion site. Make sure you: °? Wash your hands with soap and water before you change your bandage (dressing). If soap and water are not available, use hand sanitizer. °? Change your dressing as told by your health care provider. °? Leave stitches (sutures), skin glue, or adhesive strips in place. These skin closures may need to stay in place for 2 weeks or longer. If adhesive strip edges start to loosen and curl up, you may trim the loose edges. Do not remove adhesive strips completely unless your health care provider tells you to do that. °· Check your port insertion site every day for signs of infection. Check for: °? More redness, swelling, or pain. °? More fluid or blood. °? Warmth. °? Pus or a bad smell. °General instructions °· Do not take baths, swim, or use a hot tub until your health care provider approves. °· Do not lift anything that is heavier than 10 lb (4.5  kg) for a week, or as told by your health care provider. °· Ask your health care provider when it is okay to: °? Return to work or school. °? Resume usual physical activities or sports. °· Do not drive for 24 hours if you were given a medicine to help you relax (sedative). °· Take over-the-counter and prescription medicines only as told by your health care provider. °· Wear a medical alert bracelet in case of an emergency. This will tell any health care providers that you have a port. °· Keep all follow-up visits as told by your health care provider. This is important. °Contact a health care provider if: °· You cannot flush your port with saline as directed, or you cannot draw blood from the port. °· You have a fever or chills. °· You have more redness, swelling, or pain around your port insertion site. °· You have more fluid or blood coming from your port insertion site. °· Your port insertion site feels warm to the touch. °· You have pus or a bad smell coming from the port insertion site. °Get help right away if: °· You have chest pain or shortness of breath. °· You have bleeding from your port that you cannot control. °Summary °· Take care of the port as told by your health care provider. °· Change your dressing as told by your health care provider. °· Keep all follow-up visits as told by your health care provider. °  This information is not intended to replace advice given to you by your health care provider. Make sure you discuss any questions you have with your health care provider. °Document Released: 01/03/2013 Document Revised: 02/04/2016 Document Reviewed: 02/04/2016 °Elsevier Interactive Patient Education © 2017 Else[employer] Inc[employer] °Moderate Conscious Sedation, Adult, Care After °These instructions provide you with information about caring for yourself after your procedure. Your health care provider may also give you more specific instructions. Your treatment has been planned according to current medical  practices, but problems sometimes occur. Call your health care provider if you have any problems or questions after your procedure. °What can I expect after the procedure? °After your procedure, it is common: °· To feel sleepy for several hours. °· To feel clumsy and have poor balance for several hours. °· To have poor judgment for several hours. °· To vomit if you eat too soon. ° °Follow these instructions at home: °For at least 24 hours after the procedure: ° °· Do not: °? Participate in activities where you could fall or become injured. °? Drive. °? Use heavy machinery. °? Drink alcohol. °? Take sleeping pills or medicines that cause drowsiness. °? Make important decisions or sign legal documents. °? Take care of children on your own. °· Rest. °Eating and drinking °· Follow the diet recommended by your health care provider. °· If you vomit: °? Drink water, juice, or soup when you can drink without vomiting. °? Make sure you have little or no nausea before eating solid foods. °General instructions °· Have a responsible adult stay with you until you are awake and alert. °· Take over-the-counter and prescription medicines only as told by your health care provider. °· If you smoke, do not smoke without supervision. °· Keep all follow-up visits as told by your health care provider. This is important. °Contact a health care provider if: °· You keep feeling nauseous or you keep vomiting. °· You feel light-headed. °· You develop a rash. °· You have a fever. °Get help right away if: °· You have trouble breathing. °This information is not intended to replace advice given to you by your health care provider. Make sure you discuss any questions you have with your health care provider. °Document Released: 01/03/2013 Document Revised: 08/18/2015 Document Reviewed: 07/05/2015 °Elsevier Interactive Patient Education © 2018 Elsevier Inc. ° °

## 2017-09-21 ENCOUNTER — Encounter: Payer: Self-pay | Admitting: Vascular Surgery

## 2017-09-21 DIAGNOSIS — C259 Malignant neoplasm of pancreas, unspecified: Secondary | ICD-10-CM | POA: Diagnosis not present

## 2017-09-22 MED FILL — XELODA 500 MG TABLET: 500 | 21 days supply | Qty: 84 | Fill #0

## 2017-09-23 ENCOUNTER — Telehealth: Payer: Self-pay

## 2017-09-23 NOTE — Telephone Encounter (Signed)
Patient contacted office to reschedule his ERCP 09/27/17.  I will call patient back on Monday to reschedule, once I discuss with Ginger.  Thanks [employer]

## 2017-09-26 ENCOUNTER — Encounter: Payer: Self-pay | Admitting: *Deleted

## 2017-09-26 NOTE — Telephone Encounter (Addendum)
Contacted pt and rescheduled his ERCP to 10/18/17. New instructions have been mailed to pt.

## 2017-09-27 NOTE — Progress Notes (Signed)
[place]  [phone] [phone]  ID: Benjamin Diaz. OB: 12-Jul-1960  MR#: [id number]  QIH#:[id number]  Patient Care Team: Patient, [name] as PCP - General (General Practice) [name], MD as Consulting Physician (Oncology) [name], MD (Inactive) as Consulting Physician (Gastroenterology) [name], MD as Consulting Physician (Gastroenterology)  CHIEF COMPLAINT: Stage IV pancreatic cancer  INTERVAL HISTORY: Patient returns the clinic today for further evaluation and consideration of cycle 4 of CAPEOX.  He recently had his port placed and tolerated the procedure well.  His abdominal pain has improved, the patient noted he passed his biliary stent with a recent bowel movement.  He continues to have a fair appetite and his weight has remained stable. He continues to have chronic weakness and fatigue.  He has no neurologic complaints. He denies any recent fevers or illnesses. He has no chest pain or shortness of breath. He denies any nausea, vomiting, constipation, or diarrhea. He has no urinary complaints.  Patient offers no further specific complaints today.  REVIEW OF SYSTEMS:   Review of Systems  Constitutional: Positive for malaise/fatigue. Negative for fever and weight loss.  Respiratory: Negative.  Negative for cough and shortness of breath.   Cardiovascular: Negative.  Negative for chest pain and leg swelling.  Gastrointestinal: Negative.  Negative for abdominal pain, diarrhea, nausea and vomiting.  Genitourinary: Negative.  Negative for dysuria, frequency and urgency.  Musculoskeletal: Negative.  Negative for joint pain.  Skin: Negative.  Negative for itching and rash.  Neurological: Positive for weakness. Negative for sensory change and focal weakness.  Psychiatric/Behavioral: Negative.  The patient is not nervous/anxious.     As per HPI. Otherwise, a complete review of systems is negative.  PAST MEDICAL  HISTORY: Past Medical History:  Diagnosis Date  . Arthritis   . Asthma   . Atrial fibrillation ([place])   . Cancer ([place])   . Closed left ankle fracture age 71  . COPD (chronic obstructive pulmonary disease) ([place])   . Dysrhythmia   . Pneumonia 2011  . Right shoulder injury 09/27/2016  . Sciatica    left leg pinched nerve numb at times about once a year    PAST SURGICAL HISTORY: Past Surgical History:  Procedure Laterality Date  . BILIARY STENT PLACEMENT N/A 09/01/2016   Procedure: BILIARY STENT PLACEMENT;  Surgeon: [name], MD;  Location: [facility];  Service: Endoscopy;  Laterality: N/A;  . DIAGNOSTIC LAPAROSCOPIC LIVER BIOPSY  09/16/2016   Procedure: DIAGNOSTIC LAPAROSCOPIC LIVER BIOPSY;  Surgeon: [name], MD;  Location: [facility];  Service: General;;  . ERCP N/A 07/09/2016   Procedure: ENDOSCOPIC RETROGRADE CHOLANGIOPANCREATOGRAPHY (ERCP);  Surgeon: [name], MD;  Location: [facility];  Service: Endoscopy;  Laterality: N/A;  . ERCP N/A 09/01/2016   Procedure: ENDOSCOPIC RETROGRADE CHOLANGIOPANCREATOGRAPHY (ERCP);  Surgeon: [name], MD;  Location: [facility];  Service: Endoscopy;  Laterality: N/A;  . ERCP N/A 06/28/2017   Procedure: ENDOSCOPIC RETROGRADE CHOLANGIOPANCREATOGRAPHY (ERCP);  Surgeon: [name], MD;  Location: [facility] ENDOSCOPY;  Service: Endoscopy;  Laterality: N/A;  . ESOPHAGOGASTRODUODENOSCOPY (EGD) WITH PROPOFOL N/A 09/01/2016   Procedure: ESOPHAGOGASTRODUODENOSCOPY (EGD) WITH PROPOFOL;  Surgeon: [name], MD;  [facility];  Service: Gastroenterology;  Laterality: N/A;  . EUS N/A 09/01/2016   Procedure: ESOPHAGEAL ENDOSCOPIC ULTRASOUND (EUS) RADIAL;  Surgeon: [name], MD;  [facility];  Service: Endoscopy;  Laterality: N/A;  . FINE NEEDLE ASPIRATION N/A 09/01/2016   Procedure: FINE NEEDLE ASPIRATION (FNA) RADIAL;  Surgeon:  [name], MD;  Location: [facility];  Service: Endoscopy;  Laterality: N/A;  . FOREIGN BODY  REMOVAL N/A 09/01/2016   Procedure: FOREIGN BODY REMOVAL;  Surgeon: [name], MD;  [facility];  Service: Gastroenterology;  Laterality: N/A;  . KNEE SURGERY Right 2002   arthroscopy  . PORTA CATH INSERTION N/A 09/20/2017   Procedure: PORTA CATH INSERTION;  Surgeon: [name], MD;  Location: [place];  Service: Cardiovascular;  Laterality: N/A;  . SHOULDER SURGERY Right 2002    FAMILY HISTORY: Family History  Problem Relation Age of Onset  . Heart disease Father     ADVANCED DIRECTIVES (Y/N):  N  HEALTH MAINTENANCE: Social History   Tobacco Use  . Smoking status: Current Every Day Smoker    Packs/day: 1.00    Years: 40.00    Pack years: 40.00    Types: Cigarettes  . Smokeless tobacco: Never Used  Substance Use Topics  . Alcohol use: Yes    Alcohol/week: 4.2 oz    Types: 7 Cans of beer per week    Comment: last drink 5 weeeks ago  april 2018  . Drug use: No     Colonoscopy:  PAP:  Bone density:  Lipid panel:  Allergies  Allergen Reactions  . No Known Allergies     Current Outpatient Medications  Medication Sig Dispense Refill  . XELODA 500 MG tablet TAKE 3 TABLETS (1,500 MG TOTAL) BY MOUTH 2 (TWO) TIMES DAILY AFTER A MEAL. TAKE FOR 14 DAYS ON, THEN 7 DAYS OFF. 84 tablet 2  . albuterol (PROVENTIL HFA;VENTOLIN HFA) 108 (90 Base) MCG/ACT inhaler Inhale 1-2 puffs into the lungs every 6 (six) hours as needed for wheezing or shortness of breath. (Patient not taking: Reported on 09/28/2017) 1 Inhaler 6  . Ipratropium-Albuterol (COMBIVENT RESPIMAT) 20-100 MCG/ACT AERS respimat Inhale 1 puff into the lungs 4 (four) times daily.     No current facility-administered medications for this visit.    OBJECTIVE: Vitals:   09/28/17 0944  BP: 106/73  Pulse: 97  Resp: 18  Temp: (!) 96.5 F (35.8 C)     Body mass index is 14.77 kg/m.    ECOG FS:1 - Symptomatic but completely ambulatory  General: Thin, no acute distress. Eyes: Pink conjunctiva,  anicteric sclera. HEENT: Normocephalic, moist mucous membranes, clear oropharnyx. Lungs: Clear to auscultation bilaterally. Heart: Regular rate and rhythm. No rubs, murmurs, or gallops. Abdomen: Soft, nontender, nondistended. No organomegaly noted, normoactive bowel sounds. Musculoskeletal: No edema, cyanosis, or clubbing. Neuro: Alert, answering all questions appropriately. Cranial nerves grossly intact. Skin: No rashes or petechiae noted. Psych: Normal affect.  LAB RESULTS:  Lab Results  Component Value Date   NA 128 (L) 09/28/2017   K 4.2 09/28/2017   CL 96 (L) 09/28/2017   CO2 23 09/28/2017   GLUCOSE 94 09/28/2017   BUN 13 09/28/2017   CREATININE 0.40 (L) 09/28/2017   CALCIUM 9.1 09/28/2017   PROT 7.3 09/28/2017   ALBUMIN 3.5 09/28/2017   AST 182 (H) 09/28/2017   ALT 244 (H) 09/28/2017   ALKPHOS 1,458 (H) 09/28/2017   BILITOT 1.1 09/28/2017   GFRNONAA >60 09/28/2017   GFRAA >60 09/28/2017    Lab Results  Component Value Date   WBC 8.4 09/28/2017   NEUTROABS 4.7 09/28/2017   HGB 14.5 09/28/2017   HCT 40.8 09/28/2017   MCV 92.4 09/28/2017   PLT 317 09/28/2017      STUDIES: No results found.  ASSESSMENT: Stage IV pancreatic  cancer  PLAN:   1. Stage IV pancreatic cancer: Imaging, pathology, and Op note reviewed independently confirming stage IV disease with multiple peritoneal implants.  CT scan results from July 07, 2017 reviewed independently with mild progression of disease, but no obvious etiology of his increased bilirubin.  CA-19-9 continues to trend down to 311.  Patient now has port placement.  Proceed with cycle 4 of CAPEOX today.  He will receive oxaliplatin today and 14 days of capecitabine.  Patient will then have 7 days off.  Can always reconsider modified FOLFIRINOX (oxaliplatin 85 mg/m, leucovorin 400 mg/m, irinotecan 150 mg/m, 5-FU 2400 mg/m over 46 hours) every 2 weeks in the future.  Return to clinic in 3 weeks for further evaluation and  consideration of cycle 5.  Plan to reimage after cycle 6.   2.  Elevated liver enzymes: Improving.  Proceed with treatment as above.  Patient has not been evaluated by GI secondary to transportation issues. 3. Hyponatremia: Chronic and unchanged.  Patient's sodium is 128 today.   4. Peripheral edema: Continue elevation nightly.  Lower extremity ultrasound did not reveal DVT.   5.  Elevated bilirubin: Bilirubin is now within normal limits.  It is possible that the biliary stent he passed his bowel movement was causing obstruction. 6.  Pain: Improved.  Continue MS Contin every 12 hours and oxycodone as needed.   Patient expressed understanding and was in agreement with this plan. He also understands that He can call clinic at any time with any questions, concerns, or complaints.   Cancer Staging Pancreatic cancer [facility] Staging form: Exocrine Pancreas, AJCC 8th Edition - Clinical stage from 09/25/2016: Stage IV (cTX, cNX, pM1) - Signed by Lloyd Huger, MD on 09/25/2016   Lloyd Huger, MD   09/30/2017 6:51 AM

## 2017-09-28 ENCOUNTER — Inpatient Hospital Stay (HOSPITAL_BASED_OUTPATIENT_CLINIC_OR_DEPARTMENT_OTHER): Payer: Medicaid Other | Admitting: Oncology

## 2017-09-28 ENCOUNTER — Inpatient Hospital Stay: Payer: Medicaid Other | Attending: Oncology

## 2017-09-28 ENCOUNTER — Other Ambulatory Visit: Payer: Self-pay

## 2017-09-28 ENCOUNTER — Inpatient Hospital Stay: Payer: Medicaid Other

## 2017-09-28 VITALS — BP 106/73 | HR 97 | Temp 96.5°F | Resp 18 | Wt 108.9 lb

## 2017-09-28 DIAGNOSIS — C259 Malignant neoplasm of pancreas, unspecified: Secondary | ICD-10-CM

## 2017-09-28 DIAGNOSIS — E871 Hypo-osmolality and hyponatremia: Secondary | ICD-10-CM | POA: Insufficient documentation

## 2017-09-28 DIAGNOSIS — R748 Abnormal levels of other serum enzymes: Secondary | ICD-10-CM | POA: Diagnosis not present

## 2017-09-28 DIAGNOSIS — R609 Edema, unspecified: Secondary | ICD-10-CM

## 2017-09-28 DIAGNOSIS — Z5111 Encounter for antineoplastic chemotherapy: Secondary | ICD-10-CM | POA: Diagnosis present

## 2017-09-28 LAB — COMPREHENSIVE METABOLIC PANEL
ALK PHOS: 1458 U/L — AB (ref 38–126)
ALT: 244 U/L — ABNORMAL HIGH (ref 0–44)
ANION GAP: 9 (ref 5–15)
AST: 182 U/L — ABNORMAL HIGH (ref 15–41)
Albumin: 3.5 g/dL (ref 3.5–5.0)
BUN: 13 mg/dL (ref 6–20)
CALCIUM: 9.1 mg/dL (ref 8.9–10.3)
CO2: 23 mmol/L (ref 22–32)
Chloride: 96 mmol/L — ABNORMAL LOW (ref 98–111)
Creatinine, Ser: 0.4 mg/dL — ABNORMAL LOW (ref 0.61–1.24)
GFR calc non Af Amer: >60 mL/min (ref >60)
Glucose, Bld: 94 mg/dL (ref 70–99)
POTASSIUM: 4.2 mmol/L (ref 3.5–5.1)
SODIUM: 128 mmol/L — AB (ref 135–145)
TOTAL PROTEIN: 7.3 g/dL (ref 6.5–8.1)
Total Bilirubin: 1.1 mg/dL (ref 0.3–1.2)

## 2017-09-28 LAB — CBC WITH DIFFERENTIAL/PLATELET
BASOS ABS: 0 10*3/uL (ref 0–0.1)
BASOS PCT: 0 %
Eosinophils Absolute: 0.7 10*3/uL (ref 0–0.7)
Eosinophils Relative: 9 %
HEMATOCRIT: 40.8 % (ref 40.0–52.0)
HEMOGLOBIN: 14.5 g/dL (ref 13.0–18.0)
LYMPHS PCT: 23 %
Lymphs Abs: 1.9 10*3/uL (ref 1.0–3.6)
MCH: 32.9 pg (ref 26.0–34.0)
MCHC: 35.6 g/dL (ref 32.0–36.0)
MCV: 92.4 fL (ref 80.0–100.0)
MONOS PCT: 12 %
Monocytes Absolute: 1 10*3/uL (ref 0.2–1.0)
NEUTROS ABS: 4.7 10*3/uL (ref 1.4–6.5)
NEUTROS PCT: 56 %
Platelets: 317 10*3/uL (ref 150–440)
RBC: 4.42 MIL/uL (ref 4.40–5.90)
RDW: 17.1 % — ABNORMAL HIGH (ref 11.5–14.5)
WBC: 8.4 10*3/uL (ref 3.8–10.6)

## 2017-09-28 LAB — MAGNESIUM: Magnesium: 1.7 mg/dL (ref 1.7–2.4)

## 2017-09-28 MED ORDER — DEXTROSE 5 % IV SOLN
Freq: Once | INTRAVENOUS | Status: AC
  Administered 2017-09-28: 12:00:00 via INTRAVENOUS
  Filled 2017-09-28: qty 1000

## 2017-09-28 MED ORDER — DEXTROSE 5 % IV SOLN
150.0000 mg | Freq: Once | INTRAVENOUS | Status: AC
  Administered 2017-09-28: 150 mg via INTRAVENOUS
  Filled 2017-09-28: qty 20

## 2017-09-28 MED ORDER — SODIUM CHLORIDE 0.9% FLUSH
10.0000 mL | Freq: Once | INTRAVENOUS | Status: AC
  Administered 2017-09-28: 10 mL via INTRAVENOUS
  Filled 2017-09-28: qty 10

## 2017-09-28 MED ORDER — SODIUM CHLORIDE 0.9 % IV SOLN: 10.0000 mg | Freq: Once | INTRAVENOUS | Status: DC

## 2017-09-28 MED ORDER — DEXAMETHASONE SODIUM PHOSPHATE 10 MG/ML IJ SOLN
10.0000 mg | Freq: Once | INTRAMUSCULAR | Status: AC
  Administered 2017-09-28: 10 mg via INTRAVENOUS
  Filled 2017-09-28: qty 1

## 2017-09-28 MED ORDER — HEPARIN SOD (PORK) LOCK FLUSH 100 UNIT/ML IV SOLN
500.0000 [IU] | Freq: Once | INTRAVENOUS | Status: AC
  Administered 2017-09-28: 500 [IU] via INTRAVENOUS
  Filled 2017-09-28: qty 5

## 2017-09-28 MED ORDER — PALONOSETRON HCL INJECTION 0.25 MG/5ML
0.2500 mg | Freq: Once | INTRAVENOUS | Status: AC
Start: 2017-09-28 — End: 2017-09-28
  Administered 2017-09-28: 0.25 mg via INTRAVENOUS
  Filled 2017-09-28: qty 5

## 2017-09-28 NOTE — Progress Notes (Signed)
Here for follow up. Per pt I am doing alright

## 2017-09-29 LAB — CANCER ANTIGEN 19-9: CA 19-9: 311 U/mL — ABNORMAL HIGH (ref 0–35)

## 2017-09-30 ENCOUNTER — Other Ambulatory Visit: Payer: Self-pay | Admitting: Oncology

## 2017-09-30 MED ORDER — IPRATROPIUM-ALBUTEROL 20-100 MCG/ACT IN AERS: 1.0000 | INHALATION_SPRAY | Freq: Four times a day (QID) | RESPIRATORY_TRACT | 6 refills | Status: DC

## 2017-10-17 MED FILL — XELODA 500 MG TABLET: 500 | 21 days supply | Qty: 84 | Fill #1

## 2017-10-17 NOTE — Progress Notes (Signed)
[place]  [phone] [phone]  ID: Benjamin Diaz. OB: 06-07-1960  MR#: [id number]  BSJ#:[id number]  Patient Care Team: Patient, [name] as PCP - General (General Practice) [name], MD as Consulting Physician (Oncology) [name], MD (Inactive) as Consulting Physician (Gastroenterology) [name], MD as Consulting Physician (Gastroenterology)  CHIEF COMPLAINT: Stage IV pancreatic cancer  INTERVAL HISTORY: Patient returns to clinic today for further evaluation and consideration of cycle 5 of CAPOX.  He continues to tolerate his treatments well.  His abdominal pain has essentially resolved since he passed his biliary stent with a bowel movement approximately 1 month ago.  He has chronic weakness and fatigue.  He has a poor appetite. He has no neurologic complaints. He denies any recent fevers or illnesses. He has no chest pain or shortness of breath. He denies any nausea, vomiting, constipation, or diarrhea. He has no urinary complaints.  Patient offers no further specific complaints today.  REVIEW OF SYSTEMS:   Review of Systems  Constitutional: Positive for malaise/fatigue and weight loss. Negative for fever.  Respiratory: Negative.  Negative for cough and shortness of breath.   Cardiovascular: Negative.  Negative for chest pain and leg swelling.  Gastrointestinal: Negative.  Negative for abdominal pain, diarrhea, nausea and vomiting.  Genitourinary: Negative.  Negative for dysuria, frequency and urgency.  Musculoskeletal: Negative.  Negative for joint pain.  Skin: Negative.  Negative for itching and rash.  Neurological: Positive for weakness. Negative for sensory change and focal weakness.  Psychiatric/Behavioral: Negative.  The patient is not nervous/anxious.     As per HPI. Otherwise, a complete review of systems is negative.  PAST MEDICAL HISTORY: Past Medical History:  Diagnosis Date  . Arthritis   . Asthma    . Atrial fibrillation ([place])   . Cancer ([place])   . Closed left ankle fracture age 14  . COPD (chronic obstructive pulmonary disease) ([place])   . Dysrhythmia   . Pneumonia 2011  . Right shoulder injury 09/27/2016  . Sciatica    left leg pinched nerve numb at times about once a year    PAST SURGICAL HISTORY: Past Surgical History:  Procedure Laterality Date  . BILIARY STENT PLACEMENT N/A 09/01/2016   Procedure: BILIARY STENT PLACEMENT;  Surgeon: [name], MD;  [facility]: [facility];  Service: Endoscopy;  Laterality: N/A;  . DIAGNOSTIC LAPAROSCOPIC LIVER BIOPSY  09/16/2016   Procedure: DIAGNOSTIC LAPAROSCOPIC LIVER BIOPSY;  Surgeon: [name], MD;  Location: [place];  Service: General;;  . ERCP N/A 07/09/2016   Procedure: ENDOSCOPIC RETROGRADE CHOLANGIOPANCREATOGRAPHY (ERCP);  Surgeon: [name], MD;  Location: [facility];  Service: Endoscopy;  Laterality: N/A;  . ERCP N/A 09/01/2016   Procedure: ENDOSCOPIC RETROGRADE CHOLANGIOPANCREATOGRAPHY (ERCP);  Surgeon: [name], MD;  Location: [facility];  Service: Endoscopy;  Laterality: N/A;  . ERCP N/A 06/28/2017   Procedure: ENDOSCOPIC RETROGRADE CHOLANGIOPANCREATOGRAPHY (ERCP);  Surgeon: [name], MD;  Location: [facility] ENDOSCOPY;  Service: Endoscopy;  Laterality: N/A;  . ESOPHAGOGASTRODUODENOSCOPY (EGD) WITH PROPOFOL N/A 09/01/2016   Procedure: ESOPHAGOGASTRODUODENOSCOPY (EGD) WITH PROPOFOL;  Surgeon: [name], MD;  [facility];  Service: Gastroenterology;  Laterality: N/A;  . EUS N/A 09/01/2016   Procedure: ESOPHAGEAL ENDOSCOPIC ULTRASOUND (EUS) RADIAL;  Surgeon: [name], MD;  [facility];  Service: Endoscopy;  Laterality: N/A;  . FINE NEEDLE ASPIRATION N/A 09/01/2016   Procedure: FINE NEEDLE ASPIRATION (FNA) RADIAL;  Surgeon: [name], MD;  [facility];  Service: Endoscopy;  Laterality:  N/A;  . FOREIGN BODY REMOVAL N/A 09/01/2016   Procedure: FOREIGN BODY REMOVAL;  Surgeon: [name], MD;  [facility];  Service: Gastroenterology;  Laterality: N/A;  . KNEE SURGERY Right 2002   arthroscopy  . PORTA CATH INSERTION N/A 09/20/2017   Procedure: PORTA CATH INSERTION;  Surgeon: [name], MD;  Location: [place] [facility];  Service: Cardiovascular;  Laterality: N/A;  . SHOULDER SURGERY Right 2002    FAMILY HISTORY: Family History  Problem Relation Age of Onset  . Heart disease Father     ADVANCED DIRECTIVES (Y/N):  N  HEALTH MAINTENANCE: Social History   Tobacco Use  . Smoking status: Current Every Day Smoker    Packs/day: 1.00    Years: 40.00    Pack years: 40.00    Types: Cigarettes  . Smokeless tobacco: Never Used  Substance Use Topics  . Alcohol use: Yes    Alcohol/week: 4.2 oz    Types: 7 Cans of beer per week    Comment: last drink 5 weeeks ago  april 2018  . Drug use: No     Colonoscopy:  PAP:  Bone density:  Lipid panel:  Allergies  Allergen Reactions  . No Known Allergies     Current Outpatient Medications  Medication Sig Dispense Refill  . XELODA 500 MG tablet TAKE 3 TABLETS (1,500 MG TOTAL) BY MOUTH 2 (TWO) TIMES DAILY AFTER A MEAL. TAKE FOR 14 DAYS ON, THEN 7 DAYS OFF. 84 tablet 2  . albuterol (PROVENTIL HFA;VENTOLIN HFA) 108 (90 Base) MCG/ACT inhaler Inhale 1-2 puffs into the lungs every 6 (six) hours as needed for wheezing or shortness of breath. 1 Inhaler 6  . Ipratropium-Albuterol (COMBIVENT RESPIMAT) 20-100 MCG/ACT AERS respimat Inhale 1 puff into the lungs 4 (four) times daily. 1 Inhaler 6  . megestrol (MEGACE) 40 MG tablet Take 1 tablet (40 mg total) by mouth daily. 30 tablet 2   No current facility-administered medications for this visit.    OBJECTIVE: Vitals:   10/19/17 1013  BP: 112/77  Pulse: 81  Resp: 18  Temp: (!) 96.5 F (35.8 C)     Body mass index is 15.26 kg/m.    ECOG FS:1 - Symptomatic but completely ambulatory  General: Thin, no acute distress. Eyes: Pink conjunctiva, anicteric  sclera. HEENT: Normocephalic, moist mucous membranes, clear oropharnyx. Lungs: Clear to auscultation bilaterally. Heart: Regular rate and rhythm. No rubs, murmurs, or gallops. Abdomen: Soft, nontender, nondistended. No organomegaly noted, normoactive bowel sounds. Musculoskeletal: No edema, cyanosis, or clubbing. Neuro: Alert, answering all questions appropriately. Cranial nerves grossly intact. Skin: No rashes or petechiae noted. Psych: Normal affect.   LAB RESULTS:  Lab Results  Component Value Date   NA 129 (L) 10/19/2017   K 4.4 10/19/2017   CL 96 (L) 10/19/2017   CO2 24 10/19/2017   GLUCOSE 114 (H) 10/19/2017   BUN 10 10/19/2017   CREATININE 0.34 (L) 10/19/2017   CALCIUM 9.0 10/19/2017   PROT 7.0 10/19/2017   ALBUMIN 3.3 (L) 10/19/2017   AST 140 (H) 10/19/2017   ALT 161 (H) 10/19/2017   ALKPHOS 1,215 (H) 10/19/2017   BILITOT 0.8 10/19/2017   GFRNONAA >60 10/19/2017   GFRAA >60 10/19/2017    Lab Results  Component Value Date   WBC 8.4 10/19/2017   NEUTROABS 4.6 10/19/2017   HGB 14.5 10/19/2017   HCT 41.5 10/19/2017   MCV 94.1 10/19/2017   PLT 284 10/19/2017      STUDIES: No results found.  ASSESSMENT: Stage IV pancreatic cancer  PLAN:   1. Stage IV pancreatic cancer: Imaging, pathology, and Op note reviewed independently confirming stage IV disease with multiple peritoneal implants.  CT scan results from July 07, 2017 reviewed independently with mild progression of disease, but no obvious etiology of his increased bilirubin.  CA-19-9 continues to trend down and is now 247.  Patient now has port placement.  Proceed with cycle 5 of CAPEOX today.  He will receive oxaliplatin today and 14 days of capecitabine.  Patient will then have 7 days off.  Can always reconsider modified FOLFIRINOX (oxaliplatin 85 mg/m, leucovorin 400 mg/m, irinotecan 150 mg/m, 5-FU 2400 mg/m over 46 hours) every 2 weeks in the future.  Return to clinic in 3 weeks for further evaluation  and consideration of cycle 6.  Plan to reimage at the conclusion of cycle 6. 2.  Elevated liver enzymes: AST and ALT continue to trend down.  Proceed with treatment as above. Patient has not been evaluated by GI secondary to transportation issues. 3. Hyponatremia: Chronic and unchanged.  Patient's sodium is 129 today.   4. Peripheral edema: Continue elevation nightly.  Lower extremity ultrasound did not reveal DVT.   5.  Elevated bilirubin: Resolved. 6.  Pain: Improved.  Continue MS Contin every 12 hours and oxycodone as needed. 7.  Poor appetite: Patient is given a prescription for Megace. 8.  Shortness of breath: Patient was given a refill for his inhalers.   Patient expressed understanding and was in agreement with this plan. He also understands that He can call clinic at any time with any questions, concerns, or complaints.   Cancer Staging Pancreatic cancer [facility]) Staging form: Exocrine Pancreas, AJCC 8th Edition - Clinical stage from 09/25/2016: Stage IV (cTX, cNX, pM1) - Signed by Lloyd Huger, MD on 09/25/2016   Lloyd Huger, MD   10/21/2017 1:21 PM

## 2017-10-18 ENCOUNTER — Encounter: Admission: RE | Payer: Self-pay | Source: Ambulatory Visit

## 2017-10-18 ENCOUNTER — Ambulatory Visit: Admission: RE | Admit: 2017-10-18 | Payer: Medicaid Other | Source: Ambulatory Visit | Admitting: Gastroenterology

## 2017-10-18 SURGERY — ERCP, WITH INTERVENTION IF INDICATED
Anesthesia: General

## 2017-10-19 ENCOUNTER — Other Ambulatory Visit: Payer: Self-pay

## 2017-10-19 ENCOUNTER — Inpatient Hospital Stay: Payer: Medicaid Other

## 2017-10-19 ENCOUNTER — Telehealth: Payer: Self-pay | Admitting: Pharmacist

## 2017-10-19 ENCOUNTER — Inpatient Hospital Stay (HOSPITAL_BASED_OUTPATIENT_CLINIC_OR_DEPARTMENT_OTHER): Payer: Medicaid Other | Admitting: Oncology

## 2017-10-19 VITALS — BP 112/77 | HR 81 | Temp 96.5°F | Resp 18 | Wt 112.5 lb

## 2017-10-19 DIAGNOSIS — C259 Malignant neoplasm of pancreas, unspecified: Secondary | ICD-10-CM

## 2017-10-19 DIAGNOSIS — R748 Abnormal levels of other serum enzymes: Secondary | ICD-10-CM | POA: Diagnosis not present

## 2017-10-19 DIAGNOSIS — Z5111 Encounter for antineoplastic chemotherapy: Secondary | ICD-10-CM | POA: Diagnosis not present

## 2017-10-19 DIAGNOSIS — R600 Localized edema: Secondary | ICD-10-CM

## 2017-10-19 DIAGNOSIS — E871 Hypo-osmolality and hyponatremia: Secondary | ICD-10-CM

## 2017-10-19 DIAGNOSIS — R0602 Shortness of breath: Secondary | ICD-10-CM

## 2017-10-19 DIAGNOSIS — R5382 Chronic fatigue, unspecified: Secondary | ICD-10-CM

## 2017-10-19 DIAGNOSIS — F17210 Nicotine dependence, cigarettes, uncomplicated: Secondary | ICD-10-CM

## 2017-10-19 DIAGNOSIS — R5383 Other fatigue: Secondary | ICD-10-CM

## 2017-10-19 DIAGNOSIS — R630 Anorexia: Secondary | ICD-10-CM

## 2017-10-19 LAB — COMPREHENSIVE METABOLIC PANEL
ALBUMIN: 3.3 g/dL — AB (ref 3.5–5.0)
ALT: 161 U/L — ABNORMAL HIGH (ref 0–44)
AST: 140 U/L — ABNORMAL HIGH (ref 15–41)
Alkaline Phosphatase: 1215 U/L — ABNORMAL HIGH (ref 38–126)
Anion gap: 9 (ref 5–15)
BUN: 10 mg/dL (ref 6–20)
CALCIUM: 9 mg/dL (ref 8.9–10.3)
CO2: 24 mmol/L (ref 22–32)
Chloride: 96 mmol/L — ABNORMAL LOW (ref 98–111)
Creatinine, Ser: 0.34 mg/dL — ABNORMAL LOW (ref 0.61–1.24)
GFR calc non Af Amer: >60 mL/min (ref >60)
Glucose, Bld: 114 mg/dL — ABNORMAL HIGH (ref 70–99)
Potassium: 4.4 mmol/L (ref 3.5–5.1)
SODIUM: 129 mmol/L — AB (ref 135–145)
Total Bilirubin: 0.8 mg/dL (ref 0.3–1.2)
Total Protein: 7 g/dL (ref 6.5–8.1)

## 2017-10-19 LAB — CBC WITH DIFFERENTIAL/PLATELET
BASOS ABS: 0.1 10*3/uL (ref 0–0.1)
BASOS PCT: 2 %
Eosinophils Absolute: 0.6 10*3/uL (ref 0–0.7)
Eosinophils Relative: 8 %
HCT: 41.5 % (ref 40.0–52.0)
Hemoglobin: 14.5 g/dL (ref 13.0–18.0)
Lymphocytes Relative: 22 %
Lymphs Abs: 1.8 10*3/uL (ref 1.0–3.6)
MCH: 32.8 pg (ref 26.0–34.0)
MCHC: 34.9 g/dL (ref 32.0–36.0)
MCV: 94.1 fL (ref 80.0–100.0)
Monocytes Absolute: 1.2 10*3/uL — ABNORMAL HIGH (ref 0.2–1.0)
Monocytes Relative: 14 %
NEUTROS ABS: 4.6 10*3/uL (ref 1.4–6.5)
NEUTROS PCT: 54 %
Platelets: 284 10*3/uL (ref 150–440)
RBC: 4.41 MIL/uL (ref 4.40–5.90)
RDW: 17 % — ABNORMAL HIGH (ref 11.5–14.5)
WBC: 8.4 10*3/uL (ref 3.8–10.6)

## 2017-10-19 LAB — MAGNESIUM: MAGNESIUM: 1.7 mg/dL (ref 1.7–2.4)

## 2017-10-19 MED ORDER — DEXTROSE 5 % IV SOLN
Freq: Once | INTRAVENOUS | Status: AC
  Administered 2017-10-19: 11:00:00 via INTRAVENOUS
  Filled 2017-10-19: qty 1000

## 2017-10-19 MED ORDER — ALBUTEROL SULFATE HFA 108 (90 BASE) MCG/ACT IN AERS: 1.0000 | INHALATION_SPRAY | Freq: Four times a day (QID) | RESPIRATORY_TRACT | 6 refills | Status: DC | PRN

## 2017-10-19 MED ORDER — SODIUM CHLORIDE 0.9 % IV SOLN
10.0000 mg | Freq: Once | INTRAVENOUS | Status: DC
  Filled 2017-10-19: qty 1

## 2017-10-19 MED ORDER — OXALIPLATIN CHEMO INJECTION 100 MG/20ML
150.0000 mg | Freq: Once | INTRAVENOUS | Status: AC
  Administered 2017-10-19: 150 mg via INTRAVENOUS
  Filled 2017-10-19: qty 30

## 2017-10-19 MED ORDER — SODIUM CHLORIDE 0.9% FLUSH
10.0000 mL | INTRAVENOUS | Status: DC | PRN
  Administered 2017-10-19: 10 mL
  Filled 2017-10-19: qty 10

## 2017-10-19 MED ORDER — IPRATROPIUM-ALBUTEROL 20-100 MCG/ACT IN AERS: 1.0000 | INHALATION_SPRAY | Freq: Four times a day (QID) | RESPIRATORY_TRACT | 6 refills | Status: DC

## 2017-10-19 MED ORDER — HEPARIN SOD (PORK) LOCK FLUSH 100 UNIT/ML IV SOLN
500.0000 [IU] | Freq: Once | INTRAVENOUS | Status: AC | PRN
  Administered 2017-10-19: 500 [IU]
  Filled 2017-10-19: qty 5

## 2017-10-19 MED ORDER — MEGESTROL ACETATE 40 MG PO TABS: 40.0000 mg | ORAL_TABLET | Freq: Every day | ORAL | 2 refills | Status: DC

## 2017-10-19 MED ORDER — PALONOSETRON HCL INJECTION 0.25 MG/5ML
0.2500 mg | Freq: Once | INTRAVENOUS | Status: AC
  Administered 2017-10-19: 0.25 mg via INTRAVENOUS
  Filled 2017-10-19: qty 5

## 2017-10-19 MED ORDER — DEXAMETHASONE SODIUM PHOSPHATE 10 MG/ML IJ SOLN
10.0000 mg | Freq: Once | INTRAMUSCULAR | Status: AC
  Administered 2017-10-19: 10 mg via INTRAVENOUS
  Filled 2017-10-19: qty 1

## 2017-10-19 NOTE — Progress Notes (Signed)
Here for follow up. Stated appetite poor  ( given ensure)  Stated overall feels weak but "ok"

## 2017-10-19 NOTE — Telephone Encounter (Signed)
Oral Chemotherapy Pharmacist Encounter  Follow-Up Form  Called patient today to follow up regarding patient's oral chemotherapy medication: Xeloda (capecitabine)  Original Start date of oral chemotherapy: 07/2017  Pt reports 0 tablets/doses of Xeloda missed in the last cycle.   Pt reports the following side effects: None reported  New medications?: None reported  Other Issues: None reported  Patient knows to call the office with questions or concerns. Oral Oncology Clinic will continue to follow.  Darl Pikes, PharmD, BCPS, [facility] Hematology/Oncology Clinical Pharmacist [facility] [place] [facility] [phone]  10/19/2017 11:11 AM

## 2017-10-20 LAB — CANCER ANTIGEN 19-9: CA 19-9: 247 U/mL — ABNORMAL HIGH (ref 0–35)

## 2017-11-06 NOTE — Progress Notes (Deleted)
Benjamin Diaz  Telephone:([phone] Fax:([phone]) Diaz  ID: Benjamin Diaz. OB: Feb 06, 1961  MR#: [id number]  WUX#:[id number]  Patient Care Team: Patient, [name] as PCP - General (General Practice) [name], MD as Consulting Physician (Oncology) [name], MD (Inactive) as Consulting Physician (Gastroenterology) [name], MD as Consulting Physician (Gastroenterology)  CHIEF COMPLAINT: Stage IV pancreatic cancer  INTERVAL HISTORY: Patient returns to clinic today for further evaluation and consideration of cycle 5 of CAPOX.  He continues to tolerate his treatments well.  His abdominal pain has essentially resolved since he passed his biliary stent with a bowel movement approximately 1 month ago.  He has chronic weakness and fatigue.  He has a poor appetite. He has no neurologic complaints. He denies any recent fevers or illnesses. He has no chest pain or shortness of breath. He denies any nausea, vomiting, constipation, or diarrhea. He has no urinary complaints.  Patient offers no further specific complaints today.  REVIEW OF SYSTEMS:   Review of Systems  Constitutional: Positive for malaise/fatigue and weight loss. Negative for fever.  Respiratory: Negative.  Negative for cough and shortness of breath.   Cardiovascular: Negative.  Negative for chest pain and leg swelling.  Gastrointestinal: Negative.  Negative for abdominal pain, diarrhea, nausea and vomiting.  Genitourinary: Negative.  Negative for dysuria, frequency and urgency.  Musculoskeletal: Negative.  Negative for joint pain.  Skin: Negative.  Negative for itching and rash.  Neurological: Positive for weakness. Negative for sensory change and focal weakness.  Psychiatric/Behavioral: Negative.  The patient is not nervous/anxious.     As per HPI. Otherwise, a complete review of systems is negative.  PAST MEDICAL HISTORY: Past Medical History:  Diagnosis Date  . Arthritis   . Asthma    . Atrial fibrillation ([place])   . Cancer ([place])   . Closed left ankle fracture age 82  . COPD (chronic obstructive pulmonary disease) ([place])   . Dysrhythmia   . Pneumonia 2011  . Right shoulder injury 09/27/2016  . Sciatica    left leg pinched nerve numb at times about once a year    PAST SURGICAL HISTORY: Past Surgical History:  Procedure Laterality Date  . BILIARY STENT PLACEMENT N/A 09/01/2016   Procedure: BILIARY STENT PLACEMENT;  Surgeon: [name], MD;  [facility];  Service: Endoscopy;  Laterality: N/A;  . DIAGNOSTIC LAPAROSCOPIC LIVER BIOPSY  09/16/2016   Procedure: DIAGNOSTIC LAPAROSCOPIC LIVER BIOPSY;  Surgeon: [name], MD;  Location: [place];  Service: General;;  . ERCP N/A 07/09/2016   Procedure: ENDOSCOPIC RETROGRADE CHOLANGIOPANCREATOGRAPHY (ERCP);  Surgeon: [name], MD;  Location: [facility];  Service: Endoscopy;  Laterality: N/A;  . ERCP N/A 09/01/2016   Procedure: ENDOSCOPIC RETROGRADE CHOLANGIOPANCREATOGRAPHY (ERCP);  Surgeon: [name], MD;  Location: [facility];  Service: Endoscopy;  Laterality: N/A;  . ERCP N/A 06/28/2017   Procedure: ENDOSCOPIC RETROGRADE CHOLANGIOPANCREATOGRAPHY (ERCP);  Surgeon: [name], MD;  Location: [facility] ENDOSCOPY;  Service: Endoscopy;  Laterality: N/A;  . ESOPHAGOGASTRODUODENOSCOPY (EGD) WITH PROPOFOL N/A 09/01/2016   Procedure: ESOPHAGOGASTRODUODENOSCOPY (EGD) WITH PROPOFOL;  Surgeon: [name], MD;  [facility];  Service: Gastroenterology;  Laterality: N/A;  . EUS N/A 09/01/2016   Procedure: ESOPHAGEAL ENDOSCOPIC ULTRASOUND (EUS) RADIAL;  Surgeon: [name], MD;  [facility];  Service: Endoscopy;  Laterality: N/A;  . FINE NEEDLE ASPIRATION N/A 09/01/2016   Procedure: FINE NEEDLE ASPIRATION (FNA) RADIAL;  Surgeon: [name], MD;  [facility];  Service: Endoscopy;  Laterality:  N/A;  . FOREIGN BODY REMOVAL N/A 09/01/2016   Procedure: FOREIGN BODY REMOVAL;  Surgeon: [name], MD;  [facility];  Service: Gastroenterology;  Laterality: N/A;  . KNEE SURGERY Right 2002   arthroscopy  . PORTA CATH INSERTION N/A 09/20/2017   Procedure: PORTA CATH INSERTION;  Surgeon: [name], MD;  Location: [place] [facility];  Service: Cardiovascular;  Laterality: N/A;  . SHOULDER SURGERY Right 2002    FAMILY HISTORY: Family History  Problem Relation Age of Onset  . Heart disease Father     ADVANCED DIRECTIVES (Y/N):  N  HEALTH MAINTENANCE: Social History   Tobacco Use  . Smoking status: Current Every Day Smoker    Packs/day: 1.00    Years: 40.00    Pack years: 40.00    Types: Cigarettes  . Smokeless tobacco: Never Used  Substance Use Topics  . Alcohol use: Yes    Alcohol/week: 7.0 standard drinks    Types: 7 Cans of beer per week    Comment: last drink 5 weeeks ago  april 2018  . Drug use: No     Colonoscopy:  PAP:  Bone density:  Lipid panel:  Allergies  Allergen Reactions  . No Known Allergies     Current Outpatient Medications  Medication Sig Dispense Refill  . albuterol (PROVENTIL HFA;VENTOLIN HFA) 108 (90 Base) MCG/ACT inhaler Inhale 1-2 puffs into the lungs every 6 (six) hours as needed for wheezing or shortness of breath. 1 Inhaler 6  . Ipratropium-Albuterol (COMBIVENT RESPIMAT) 20-100 MCG/ACT AERS respimat Inhale 1 puff into the lungs 4 (four) times daily. 1 Inhaler 6  . megestrol (MEGACE) 40 MG tablet Take 1 tablet (40 mg total) by mouth daily. 30 tablet 2  . XELODA 500 MG tablet TAKE 3 TABLETS (1,500 MG TOTAL) BY MOUTH 2 (TWO) TIMES DAILY AFTER A MEAL. TAKE FOR 14 DAYS ON, THEN 7 DAYS OFF. 84 tablet 2   No current facility-administered medications for this visit.    OBJECTIVE: There were no vitals filed for this visit.   There is no height or weight on file to calculate BMI.    ECOG FS:1 - Symptomatic but completely ambulatory  General: Thin, no acute distress. Eyes: Pink conjunctiva, anicteric sclera. HEENT:  Normocephalic, moist mucous membranes, clear oropharnyx. Lungs: Clear to auscultation bilaterally. Heart: Regular rate and rhythm. No rubs, murmurs, or gallops. Abdomen: Soft, nontender, nondistended. No organomegaly noted, normoactive bowel sounds. Musculoskeletal: No edema, cyanosis, or clubbing. Neuro: Alert, answering all questions appropriately. Cranial nerves grossly intact. Skin: No rashes or petechiae noted. Psych: Normal affect.   LAB RESULTS:  Lab Results  Component Value Date   NA 129 (L) 10/19/2017   K 4.4 10/19/2017   CL 96 (L) 10/19/2017   CO2 24 10/19/2017   GLUCOSE 114 (H) 10/19/2017   BUN 10 10/19/2017   CREATININE 0.34 (L) 10/19/2017   CALCIUM 9.0 10/19/2017   PROT 7.0 10/19/2017   ALBUMIN 3.3 (L) 10/19/2017   AST 140 (H) 10/19/2017   ALT 161 (H) 10/19/2017   ALKPHOS 1,215 (H) 10/19/2017   BILITOT 0.8 10/19/2017   GFRNONAA >60 10/19/2017   GFRAA >60 10/19/2017    Lab Results  Component Value Date   WBC 8.4 10/19/2017   NEUTROABS 4.6 10/19/2017   HGB 14.5 10/19/2017   HCT 41.5 10/19/2017   MCV 94.1 10/19/2017   PLT 284 10/19/2017      STUDIES: No results found.  ASSESSMENT: Stage IV pancreatic cancer  PLAN:  1. Stage IV pancreatic cancer: Imaging, pathology, and Op note reviewed independently confirming stage IV disease with multiple peritoneal implants.  CT scan results from July 07, 2017 reviewed independently with mild progression of disease, but no obvious etiology of his increased bilirubin.  CA-19-9 continues to trend down and is now 247.  Patient now has port placement.  Proceed with cycle 5 of CAPEOX today.  He will receive oxaliplatin today and 14 days of capecitabine.  Patient will then have 7 days off.  Can always reconsider modified FOLFIRINOX (oxaliplatin 85 mg/m, leucovorin 400 mg/m, irinotecan 150 mg/m, 5-FU 2400 mg/m over 46 hours) every 2 weeks in the future.  Return to clinic in 3 weeks for further evaluation and  consideration of cycle 6.  Plan to reimage at the conclusion of cycle 6. 2.  Elevated liver enzymes: AST and ALT continue to trend down.  Proceed with treatment as above. Patient has not been evaluated by GI secondary to transportation issues. 3. Hyponatremia: Chronic and unchanged.  Patient's sodium is 129 today.   4. Peripheral edema: Continue elevation nightly.  Lower extremity ultrasound did not reveal DVT.   5.  Elevated bilirubin: Resolved. 6.  Pain: Improved.  Continue MS Contin every 12 hours and oxycodone as needed. 7.  Poor appetite: Patient is given a prescription for Megace. 8.  Shortness of breath: Patient was given a refill for his inhalers.   Patient expressed understanding and was in agreement with this plan. He also understands that He can call clinic at any time with any questions, concerns, or complaints.   Cancer Staging Pancreatic cancer [facility]) Staging form: Exocrine Pancreas, AJCC 8th Edition - Clinical stage from 09/25/2016: Stage IV (cTX, cNX, pM1) - Signed by Lloyd Huger, MD on 09/25/2016   Lloyd Huger, MD   11/06/2017 11:26 PM

## 2017-11-07 MED FILL — XELODA 500 MG TABLET: 500 | 21 days supply | Qty: 84 | Fill #2

## 2017-11-09 ENCOUNTER — Inpatient Hospital Stay: Payer: Medicaid Other | Admitting: Oncology

## 2017-11-09 ENCOUNTER — Inpatient Hospital Stay: Payer: Medicaid Other

## 2017-11-15 ENCOUNTER — Other Ambulatory Visit: Payer: Self-pay | Admitting: Oncology

## 2017-11-15 NOTE — Progress Notes (Signed)
[place]  Telephone:([phone] Fax:(336) [phone]  ID: Benjamin Diaz. OB: 17-Apr-1960  MR#: [id number]  UQJ#:[id number]  Patient Care Team: Patient, [name] as PCP - General (General Practice) [name], MD as Consulting Physician (Oncology) [name], MD (Inactive) as Consulting Physician (Gastroenterology) [name], MD as Consulting Physician (Gastroenterology)  CHIEF COMPLAINT: Stage IV pancreatic cancer  INTERVAL HISTORY: Patient returns to clinic today for further evaluation and consideration of cycle 6 of CAPOX.  Patient skipped his appointment last week for treatment stating he did not feel well.  He currently feels well and is back to his baseline.  He has chronic weakness and fatigue.  He has a poor appetite. He has no neurologic complaints. He denies any recent fevers or illnesses.  He denies any chest pain, cough, or hemoptysis but admits to chronic shortness of breath.  He denies any nausea, vomiting, constipation, or diarrhea. He has no urinary complaints.  Patient offers no further specific complaints today.  REVIEW OF SYSTEMS:   Review of Systems  Constitutional: Positive for malaise/fatigue. Negative for fever and weight loss.  Respiratory: Positive for shortness of breath. Negative for cough.   Cardiovascular: Negative.  Negative for chest pain and leg swelling.  Gastrointestinal: Negative.  Negative for abdominal pain, diarrhea, nausea and vomiting.  Genitourinary: Negative.  Negative for dysuria, frequency and urgency.  Musculoskeletal: Negative.  Negative for joint pain.  Skin: Negative.  Negative for itching and rash.  Neurological: Positive for weakness. Negative for sensory change and focal weakness.  Psychiatric/Behavioral: Negative.  The patient is not nervous/anxious.     As per HPI. Otherwise, a complete review of systems is negative.  PAST MEDICAL HISTORY: Past Medical History:  Diagnosis Date  . Arthritis    . Asthma   . Atrial fibrillation ([place])   . Cancer ([place])   . Closed left ankle fracture age 43  . COPD (chronic obstructive pulmonary disease) ([place])   . Dysrhythmia   . Pneumonia 2011  . Right shoulder injury 09/27/2016  . Sciatica    left leg pinched nerve numb at times about once a year    PAST SURGICAL HISTORY: Past Surgical History:  Procedure Laterality Date  . BILIARY STENT PLACEMENT N/A 09/01/2016   Procedure: BILIARY STENT PLACEMENT;  Surgeon: [name], MD;  [facility];  Service: Endoscopy;  Laterality: N/A;  . DIAGNOSTIC LAPAROSCOPIC LIVER BIOPSY  09/16/2016   Procedure: DIAGNOSTIC LAPAROSCOPIC LIVER BIOPSY;  Surgeon: [name], MD;  Location: [place];  Service: General;;  . ERCP N/A 07/09/2016   Procedure: ENDOSCOPIC RETROGRADE CHOLANGIOPANCREATOGRAPHY (ERCP);  Surgeon: [name], MD;  Location: [facility];  Service: Endoscopy;  Laterality: N/A;  . ERCP N/A 09/01/2016   Procedure: ENDOSCOPIC RETROGRADE CHOLANGIOPANCREATOGRAPHY (ERCP);  Surgeon: [name], MD;  Location: [facility];  Service: Endoscopy;  Laterality: N/A;  . ERCP N/A 06/28/2017   Procedure: ENDOSCOPIC RETROGRADE CHOLANGIOPANCREATOGRAPHY (ERCP);  Surgeon: [name], MD;  Location: [facility] ENDOSCOPY;  Service: Endoscopy;  Laterality: N/A;  . ESOPHAGOGASTRODUODENOSCOPY (EGD) WITH PROPOFOL N/A 09/01/2016   Procedure: ESOPHAGOGASTRODUODENOSCOPY (EGD) WITH PROPOFOL;  Surgeon: [name], MD;  [facility];  Service: Gastroenterology;  Laterality: N/A;  . EUS N/A 09/01/2016   Procedure: ESOPHAGEAL ENDOSCOPIC ULTRASOUND (EUS) RADIAL;  Surgeon: [name], MD;  [facility];  Service: Endoscopy;  Laterality: N/A;  . FINE NEEDLE ASPIRATION N/A 09/01/2016   Procedure: FINE NEEDLE ASPIRATION (FNA) RADIAL;  Surgeon: [name], MD;  [facility];  Service: Endoscopy;  Laterality: N/A;  . FOREIGN BODY REMOVAL N/A 09/01/2016   Procedure: FOREIGN BODY REMOVAL;  Surgeon:  [name], MD;  [facility];  Service: Gastroenterology;  Laterality: N/A;  . KNEE SURGERY Right 2002   arthroscopy  . PORTA CATH INSERTION N/A 09/20/2017   Procedure: PORTA CATH INSERTION;  Surgeon: [name], MD;  Location: [place] [facility];  Service: Cardiovascular;  Laterality: N/A;  . SHOULDER SURGERY Right 2002    FAMILY HISTORY: Family History  Problem Relation Age of Onset  . Heart disease Father     ADVANCED DIRECTIVES (Y/N):  N  HEALTH MAINTENANCE: Social History   Tobacco Use  . Smoking status: Current Every Day Smoker    Packs/day: 1.00    Years: 40.00    Pack years: 40.00    Types: Cigarettes  . Smokeless tobacco: Never Used  Substance Use Topics  . Alcohol use: Yes    Alcohol/week: 7.0 standard drinks    Types: 7 Cans of beer per week    Comment: last drink 5 weeeks ago  april 2018  . Drug use: No     Colonoscopy:  PAP:  Bone density:  Lipid panel:  Allergies  Allergen Reactions  . No Known Allergies     Current Outpatient Medications  Medication Sig Dispense Refill  . albuterol (PROVENTIL HFA;VENTOLIN HFA) 108 (90 Base) MCG/ACT inhaler Inhale 1-2 puffs into the lungs every 6 (six) hours as needed for wheezing or shortness of breath. 1 Inhaler 6  . Ipratropium-Albuterol (COMBIVENT RESPIMAT) 20-100 MCG/ACT AERS respimat Inhale 1 puff into the lungs 4 (four) times daily. 1 Inhaler 6  . megestrol (MEGACE) 40 MG tablet Take 1 tablet (40 mg total) by mouth daily. 30 tablet 2  . XELODA 500 MG tablet TAKE 3 TABLETS (1,500 MG TOTAL) BY MOUTH 2 (TWO) TIMES DAILY AFTER A MEAL. TAKE FOR 14 DAYS ON, THEN 7 DAYS OFF. 84 tablet 2   No current facility-administered medications for this visit.    OBJECTIVE: Vitals:   11/16/17 0934  BP: 110/67  Pulse: 94  Resp: 18  Temp: (!) 96.4 F (35.8 C)     Body mass index is 14.99 kg/m.    ECOG FS:1 - Symptomatic but completely ambulatory  General: Thin, no acute distress. Eyes: Pink  conjunctiva, anicteric sclera. HEENT: Normocephalic, moist mucous membranes. Lungs: Clear to auscultation bilaterally. Heart: Regular rate and rhythm. No rubs, murmurs, or gallops. Abdomen: Soft, nontender, nondistended. No organomegaly noted, normoactive bowel sounds. Musculoskeletal: No edema, cyanosis, or clubbing. Neuro: Alert, answering all questions appropriately. Cranial nerves grossly intact. Skin: No rashes or petechiae noted. Psych: Normal affect.   LAB RESULTS:  Lab Results  Component Value Date   NA 129 (L) 11/16/2017   K 4.2 11/16/2017   CL 96 (L) 11/16/2017   CO2 27 11/16/2017   GLUCOSE 104 (H) 11/16/2017   BUN 10 11/16/2017   CREATININE 0.33 (L) 11/16/2017   CALCIUM 9.3 11/16/2017   PROT 7.4 11/16/2017   ALBUMIN 3.7 11/16/2017   AST 173 (H) 11/16/2017   ALT 177 (H) 11/16/2017   ALKPHOS 1,201 (H) 11/16/2017   BILITOT 0.8 11/16/2017   GFRNONAA >60 11/16/2017   GFRAA >60 11/16/2017    Lab Results  Component Value Date   WBC 8.3 11/16/2017   NEUTROABS 4.5 11/16/2017   HGB 15.8 11/16/2017   HCT 45.7 11/16/2017   MCV 96.5 11/16/2017   PLT 270 11/16/2017      STUDIES: No  results found.  ASSESSMENT: Stage IV pancreatic cancer  PLAN:   1. Stage IV pancreatic cancer: Imaging, pathology, and Op note reviewed independently confirming stage IV disease with multiple peritoneal implants.  CT scan results from July 07, 2017 reviewed independently with mild progression of disease, but no obvious etiology of his increased bilirubin.  CA-19-9 continues to trend down and is now 246.  Patient now has port placement.  Proceed with cycle 6 of CAPEOX today.  He will receive oxaliplatin today and 14 days of capecitabine.  Patient will then have 7 days off.  Can always reconsider modified FOLFIRINOX (oxaliplatin 85 mg/m, leucovorin 400 mg/m, irinotecan 150 mg/m, 5-FU 2400 mg/m over 46 hours) every 2 weeks in the future.  Return to clinic in 3 weeks for further evaluation  and consideration of cycle 7.  Will reimage with CT scans 1 to 2 days prior to next treatment. 2.  Elevated liver enzymes: AST and ALT are trending back up slightly.  Proceed with treatment as above. Patient has not been evaluated by GI secondary to transportation issues. 3. Hyponatremia: Chronic and unchanged.  Patient's sodium remained stable at 129 today. 4. Peripheral edema: Continue elevation nightly.  Lower extremity ultrasound did not reveal DVT.   5.  Elevated bilirubin: Resolved. 6.  Pain: Improved.  Continue MS Contin every 12 hours and oxycodone as needed. 7.  Poor appetite: Continue Megace as prescribed. 8.  Shortness of breath: Continue inhalers as prescribed.  Patient expressed understanding and was in agreement with this plan. He also understands that He can call clinic at any time with any questions, concerns, or complaints.   Cancer Staging Pancreatic cancer [facility]) Staging form: Exocrine Pancreas, AJCC 8th Edition - Clinical stage from 09/25/2016: Stage IV (cTX, cNX, pM1) - Signed by Lloyd Huger, MD on 09/25/2016   Lloyd Huger, MD   11/18/2017 3:56 PM

## 2017-11-16 ENCOUNTER — Inpatient Hospital Stay: Payer: Medicaid Other

## 2017-11-16 ENCOUNTER — Other Ambulatory Visit: Payer: Self-pay

## 2017-11-16 ENCOUNTER — Inpatient Hospital Stay (HOSPITAL_BASED_OUTPATIENT_CLINIC_OR_DEPARTMENT_OTHER): Payer: Medicaid Other | Admitting: Oncology

## 2017-11-16 ENCOUNTER — Inpatient Hospital Stay: Payer: Medicaid Other | Attending: Oncology

## 2017-11-16 VITALS — BP 110/67 | HR 94 | Temp 96.4°F | Resp 18 | Wt 110.5 lb

## 2017-11-16 DIAGNOSIS — C259 Malignant neoplasm of pancreas, unspecified: Secondary | ICD-10-CM

## 2017-11-16 DIAGNOSIS — R5382 Chronic fatigue, unspecified: Secondary | ICD-10-CM

## 2017-11-16 DIAGNOSIS — R630 Anorexia: Secondary | ICD-10-CM

## 2017-11-16 DIAGNOSIS — R748 Abnormal levels of other serum enzymes: Secondary | ICD-10-CM

## 2017-11-16 DIAGNOSIS — R600 Localized edema: Secondary | ICD-10-CM | POA: Diagnosis not present

## 2017-11-16 DIAGNOSIS — R52 Pain, unspecified: Secondary | ICD-10-CM

## 2017-11-16 DIAGNOSIS — R0602 Shortness of breath: Secondary | ICD-10-CM

## 2017-11-16 DIAGNOSIS — Z5111 Encounter for antineoplastic chemotherapy: Secondary | ICD-10-CM | POA: Insufficient documentation

## 2017-11-16 DIAGNOSIS — E871 Hypo-osmolality and hyponatremia: Secondary | ICD-10-CM | POA: Diagnosis not present

## 2017-11-16 DIAGNOSIS — R5383 Other fatigue: Secondary | ICD-10-CM

## 2017-11-16 LAB — CBC WITH DIFFERENTIAL/PLATELET
Basophils Absolute: 0 10*3/uL (ref 0–0.1)
Basophils Relative: 0 %
EOS ABS: 0.8 10*3/uL — AB (ref 0–0.7)
EOS PCT: 10 %
HCT: 45.7 % (ref 40.0–52.0)
Hemoglobin: 15.8 g/dL (ref 13.0–18.0)
LYMPHS ABS: 2 10*3/uL (ref 1.0–3.6)
Lymphocytes Relative: 25 %
MCH: 33.3 pg (ref 26.0–34.0)
MCHC: 34.5 g/dL (ref 32.0–36.0)
MCV: 96.5 fL (ref 80.0–100.0)
MONO ABS: 0.9 10*3/uL (ref 0.2–1.0)
MONOS PCT: 11 %
Neutro Abs: 4.5 10*3/uL (ref 1.4–6.5)
Neutrophils Relative %: 54 %
PLATELETS: 270 10*3/uL (ref 150–440)
RBC: 4.73 MIL/uL (ref 4.40–5.90)
RDW: 14.1 % (ref 11.5–14.5)
WBC: 8.3 10*3/uL (ref 3.8–10.6)

## 2017-11-16 LAB — COMPREHENSIVE METABOLIC PANEL
ALT: 177 U/L — ABNORMAL HIGH (ref 0–44)
ANION GAP: 6 (ref 5–15)
AST: 173 U/L — ABNORMAL HIGH (ref 15–41)
Albumin: 3.7 g/dL (ref 3.5–5.0)
Alkaline Phosphatase: 1201 U/L — ABNORMAL HIGH (ref 38–126)
BUN: 10 mg/dL (ref 6–20)
CHLORIDE: 96 mmol/L — AB (ref 98–111)
CO2: 27 mmol/L (ref 22–32)
Calcium: 9.3 mg/dL (ref 8.9–10.3)
Creatinine, Ser: 0.33 mg/dL — ABNORMAL LOW (ref 0.61–1.24)
Glucose, Bld: 104 mg/dL — ABNORMAL HIGH (ref 70–99)
Potassium: 4.2 mmol/L (ref 3.5–5.1)
Sodium: 129 mmol/L — ABNORMAL LOW (ref 135–145)
Total Bilirubin: 0.8 mg/dL (ref 0.3–1.2)
Total Protein: 7.4 g/dL (ref 6.5–8.1)

## 2017-11-16 LAB — MAGNESIUM: MAGNESIUM: 1.9 mg/dL (ref 1.7–2.4)

## 2017-11-16 MED ORDER — PALONOSETRON HCL INJECTION 0.25 MG/5ML
0.2500 mg | Freq: Once | INTRAVENOUS | Status: AC
  Administered 2017-11-16: 0.25 mg via INTRAVENOUS
  Filled 2017-11-16: qty 5

## 2017-11-16 MED ORDER — SODIUM CHLORIDE 0.9% FLUSH
10.0000 mL | INTRAVENOUS | Status: DC | PRN
  Administered 2017-11-16: 10 mL via INTRAVENOUS
  Filled 2017-11-16: qty 10

## 2017-11-16 MED ORDER — DEXAMETHASONE SODIUM PHOSPHATE 10 MG/ML IJ SOLN
10.0000 mg | Freq: Once | INTRAMUSCULAR | Status: AC
  Administered 2017-11-16: 10 mg via INTRAVENOUS
  Filled 2017-11-16: qty 1

## 2017-11-16 MED ORDER — DEXTROSE 5 % IV SOLN
Freq: Once | INTRAVENOUS | Status: AC
  Administered 2017-11-16: 11:00:00 via INTRAVENOUS
  Filled 2017-11-16: qty 1000

## 2017-11-16 MED ORDER — HEPARIN SOD (PORK) LOCK FLUSH 100 UNIT/ML IV SOLN
500.0000 [IU] | Freq: Once | INTRAVENOUS | Status: AC | PRN
  Administered 2017-11-16: 500 [IU]
  Filled 2017-11-16: qty 5

## 2017-11-16 MED ORDER — SODIUM CHLORIDE 0.9 % IV SOLN: 10.0000 mg | Freq: Once | INTRAVENOUS | Status: DC

## 2017-11-16 MED ORDER — OXALIPLATIN CHEMO INJECTION 100 MG/20ML
150.0000 mg | Freq: Once | INTRAVENOUS | Status: AC
  Administered 2017-11-16: 150 mg via INTRAVENOUS
  Filled 2017-11-16: qty 20

## 2017-11-16 NOTE — Progress Notes (Signed)
Here for follow up. Overall per pt " feeling alright as long as im not running around" per pt on going sob-using inhalers that he stated helps  Pulse ox 91%on RA. Pt in no noted distress. Stated on going issues with" my breathing"

## 2017-11-17 LAB — CANCER ANTIGEN 19-9: CA 19-9: 246 U/mL — ABNORMAL HIGH (ref 0–35)

## 2017-11-17 IMAGING — US US ABDOMEN LIMITED
1 series · 14 of 25 positions shown · non-contrast
Comparison: None.

CLINICAL DATA: Jaundice and pain

EXAM:
US ABDOMEN LIMITED - RIGHT UPPER QUADRANT

[Series 1: us abdomen limited · 0.18mm/px · 14 of 97 slices shown]
[im 1/97]
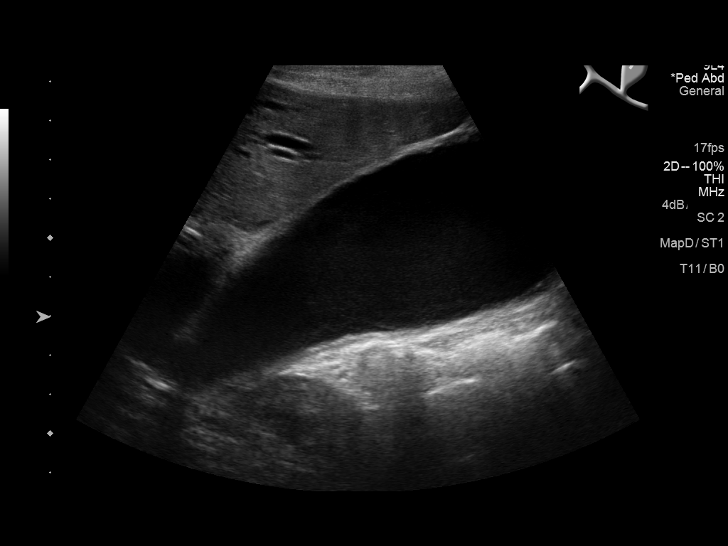
[im 9/97]
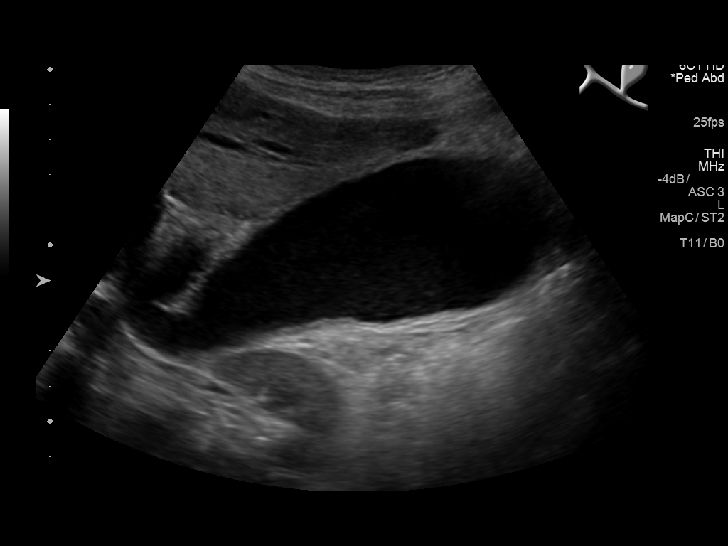
[im 17/97]
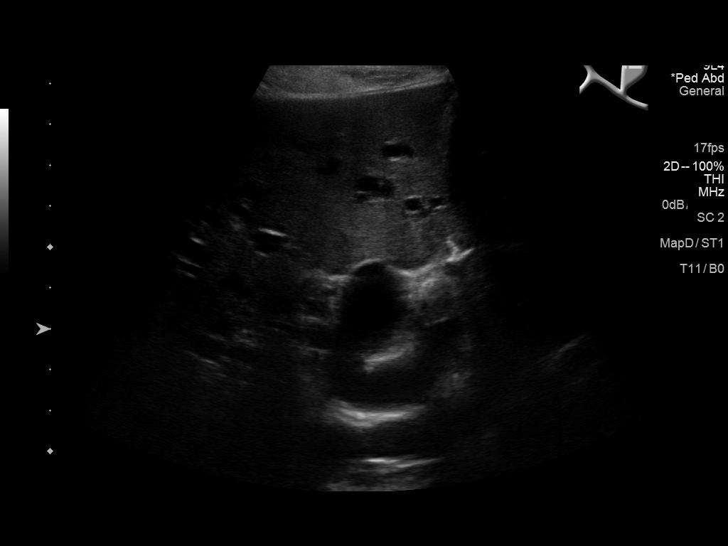
[im 25/97]
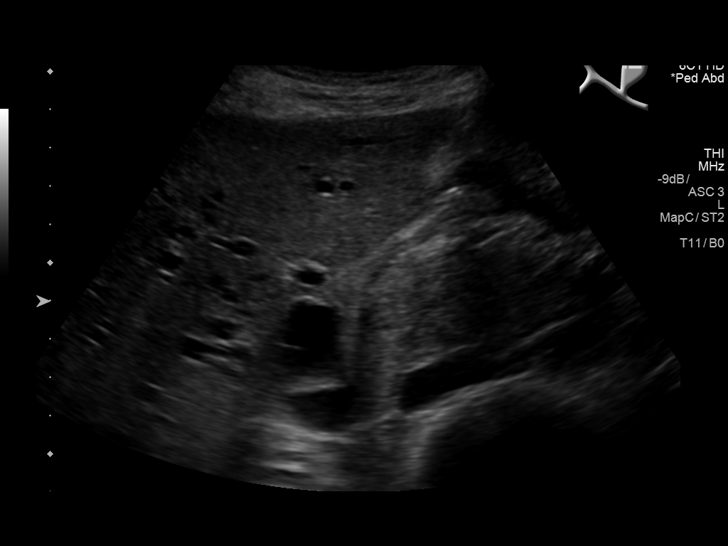
[im 33/97]
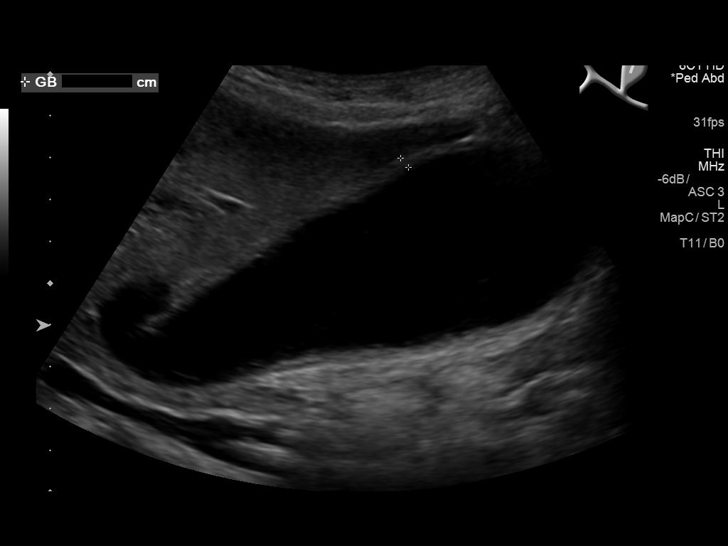
[im 37/97]
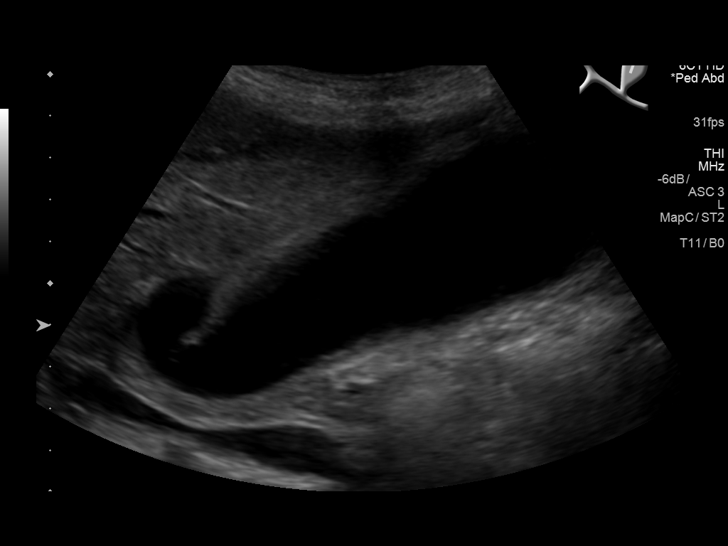
[im 45/97]
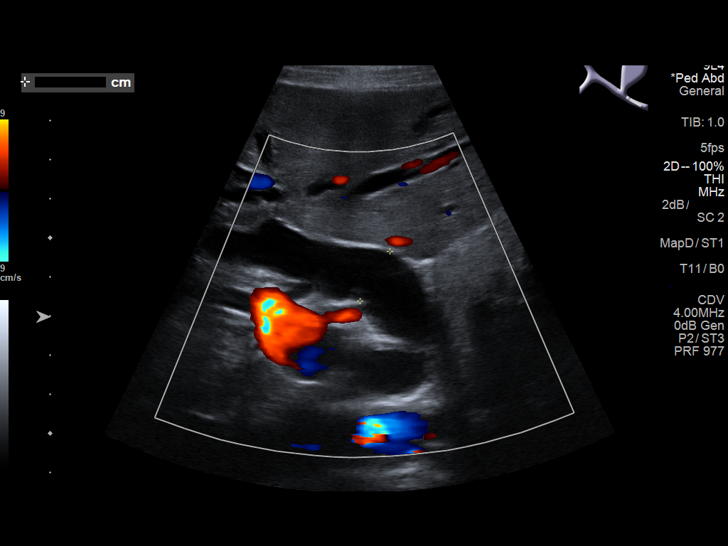
[im 53/97]
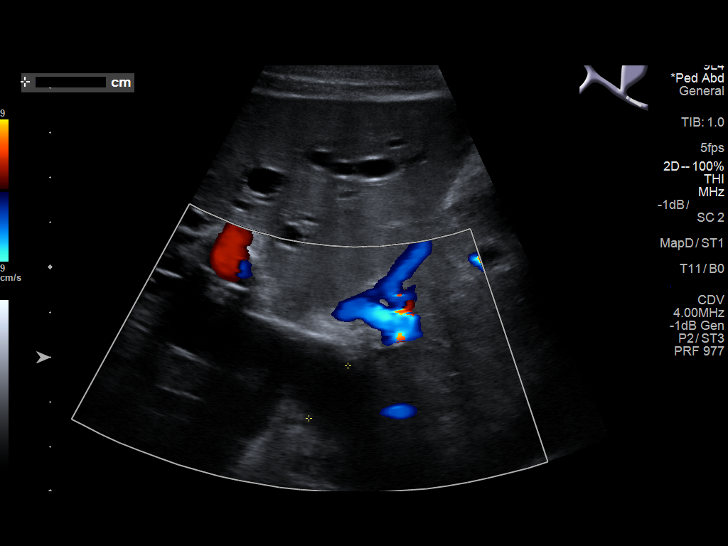
[im 61/97]
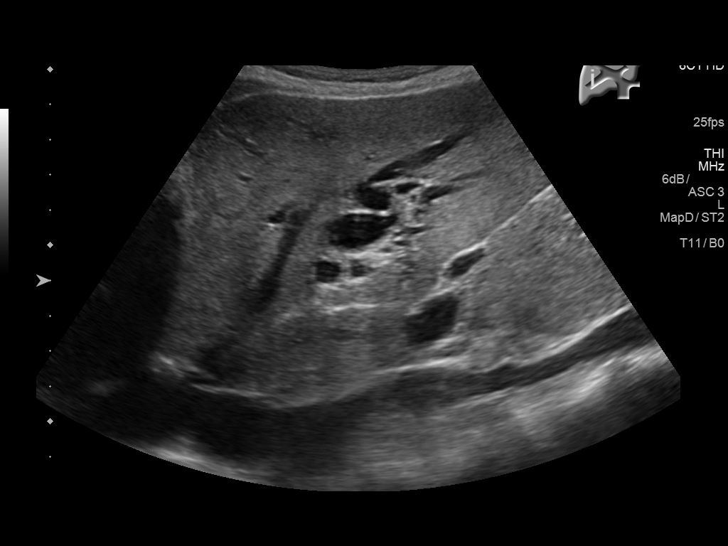
[im 65/97]
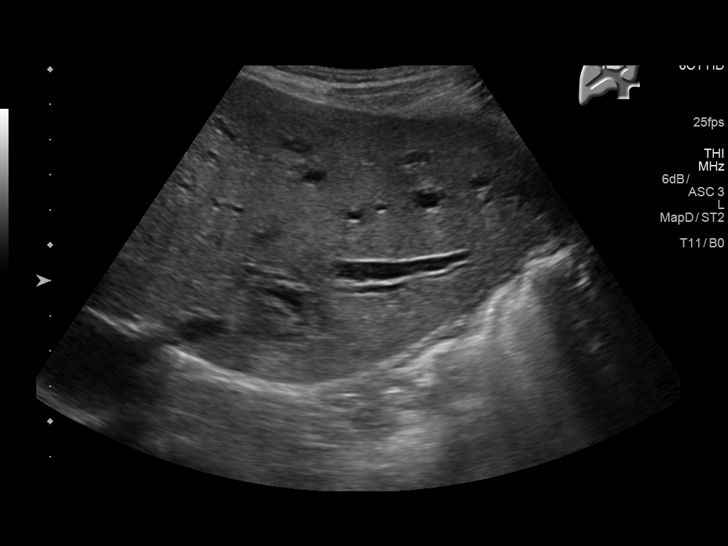
[im 73/97]
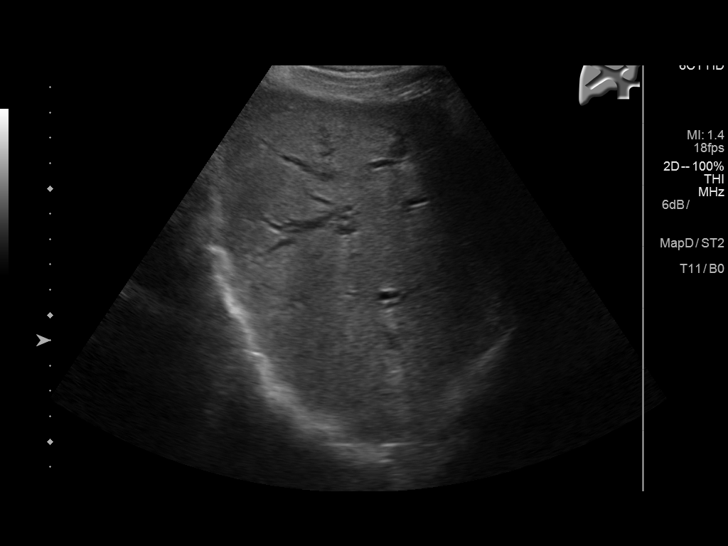
[im 81/97]
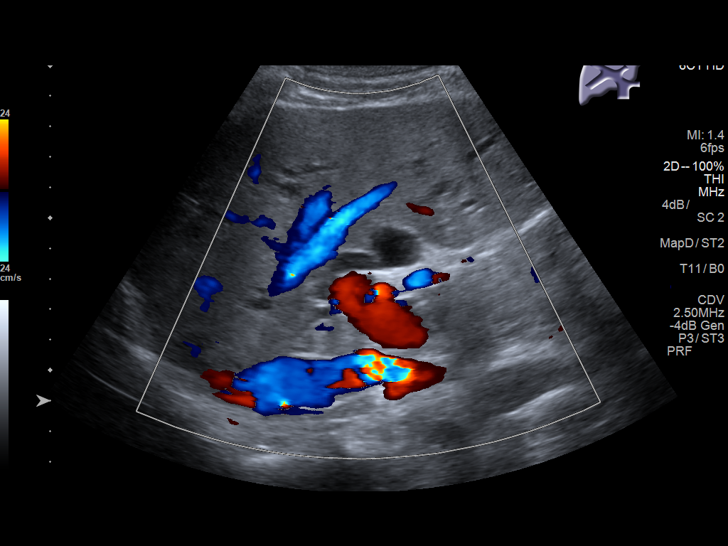
[im 89/97]
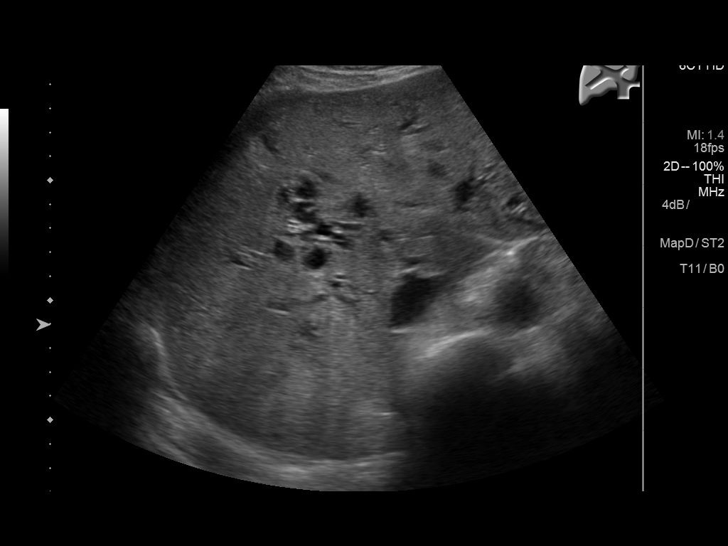
[im 97/97]
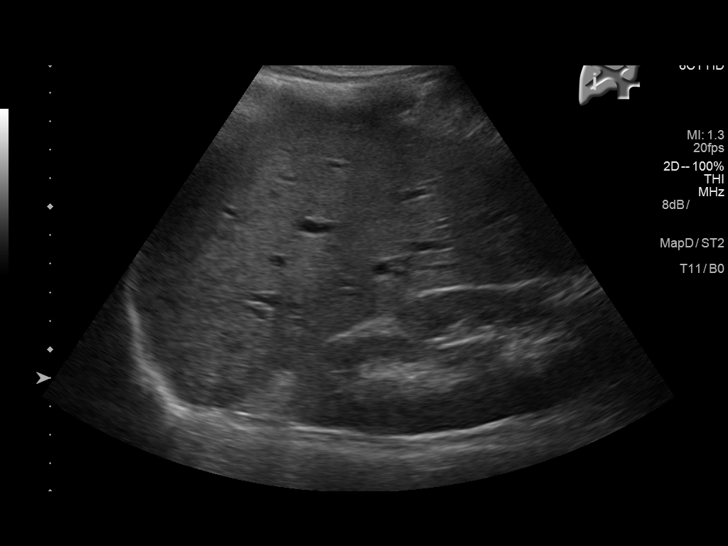

[14 of 25 positions shown; findings below may reference images not displayed]

FINDINGS: Gallbladder:

The gallbladder is distended with borderline wall thickening. No
gallstones or pericholecystic fluid evident. No sonographic Murphy
sign noted by sonographer.

Common bile duct:

Diameter: 15 mm common distended. There is generalized intrahepatic
biliary duct dilatation. There is common hepatic and common bile
duct dilatation without evident mass or calculus. No obvious
pancreatic head lesion seen by ultrasound.

Liver:

No focal lesion identified. Within normal limits in parenchymal
echogenicity. There is intrahepatic biliary duct dilatation.
IMPRESSION: Distended gallbladder with slight gallbladder wall thickening. There
may be early acalculus cholecystitis. There is generalized
intrahepatic and extrahepatic biliary duct dilatation without mass
or calculus evident. Etiology uncertain. This finding may warrant
MRCP to further assess. No parenchymal liver lesions evident.

## 2017-11-22 ENCOUNTER — Other Ambulatory Visit: Payer: Self-pay | Admitting: Oncology

## 2017-11-22 DIAGNOSIS — C259 Malignant neoplasm of pancreas, unspecified: Secondary | ICD-10-CM

## 2017-11-24 MED FILL — XELODA 500 MG TABLET: 500 | 21 days supply | Qty: 84 | Fill #0

## 2017-12-02 ENCOUNTER — Ambulatory Visit
Admission: RE | Admit: 2017-12-02 | Discharge: 2017-12-02 | Disposition: A | Discharge status: Home / Self Care | Payer: Medicaid Other | Source: Ambulatory Visit | Attending: Oncology | Admitting: Oncology

## 2017-12-02 DIAGNOSIS — C259 Malignant neoplasm of pancreas, unspecified: Secondary | ICD-10-CM | POA: Diagnosis present

## 2017-12-02 DIAGNOSIS — J439 Emphysema, unspecified: Secondary | ICD-10-CM | POA: Insufficient documentation

## 2017-12-02 DIAGNOSIS — R590 Localized enlarged lymph nodes: Secondary | ICD-10-CM | POA: Insufficient documentation

## 2017-12-02 DIAGNOSIS — R918 Other nonspecific abnormal finding of lung field: Secondary | ICD-10-CM | POA: Insufficient documentation

## 2017-12-02 DIAGNOSIS — I700 Atherosclerosis of aorta: Secondary | ICD-10-CM | POA: Insufficient documentation

## 2017-12-02 HISTORY — DX: Malignant neoplasm of pancreas, unspecified: C25.9

## 2017-12-02 MED ORDER — IOPAMIDOL (ISOVUE-300) INJECTION 61%
85.0000 mL | Freq: Once | INTRAVENOUS | Status: AC | PRN
  Administered 2017-12-02: 85 mL via INTRAVENOUS

## 2017-12-04 NOTE — Progress Notes (Signed)
[place]  Telephone:([phone] Fax:([phone]) [phone]  ID: Benjamin Diaz. OB: [birth year]  MR#: [id number]  QIO#:[id number]  Patient Care Team: Patient, [name] as PCP - General (General Practice) [name], MD as Consulting Physician (Oncology) [name], MD (Inactive) as Consulting Physician (Gastroenterology) [name], MD as Consulting Physician (Gastroenterology)  CHIEF COMPLAINT: Stage IV pancreatic cancer  INTERVAL HISTORY: Patient returns to clinic today for further evaluation, discussion of his imaging results, and consideration of cycle 7 of CAPOX.  He continues to tolerate his treatments well without significant side effects.  He has chronic weakness and fatigue that is unchanged.  He has a poor appetite, but no weight loss. He has no neurologic complaints. He denies any recent fevers or illnesses.  He denies any chest pain, shortness of breath, cough, or hemoptysis.  He denies any nausea, vomiting, constipation, or diarrhea. He has no urinary complaints.  Patient offers no further specific complaints today.  REVIEW OF SYSTEMS:   Review of Systems  Constitutional: Positive for malaise/fatigue. Negative for fever and weight loss.  Respiratory: Negative.  Negative for cough and shortness of breath.   Cardiovascular: Negative.  Negative for chest pain and leg swelling.  Gastrointestinal: Negative.  Negative for abdominal pain, diarrhea, nausea and vomiting.  Genitourinary: Negative.  Negative for dysuria, frequency and urgency.  Musculoskeletal: Negative.  Negative for joint pain.  Skin: Negative.  Negative for itching and rash.  Neurological: Positive for weakness. Negative for sensory change and focal weakness.  Psychiatric/Behavioral: Negative.  The patient is not nervous/anxious.     As per HPI. Otherwise, a complete review of systems is negative.  PAST MEDICAL HISTORY: Past Medical History:  Diagnosis Date  . Arthritis   .  Asthma   . Atrial fibrillation ([place])   . Closed left ankle fracture age 5  . COPD (chronic obstructive pulmonary disease) ([place])   . Dysrhythmia   . Pancreatic cancer ([place]) 06/2016   Chemo tx's.   . Pneumonia 2011  . Right shoulder injury 09/27/2016  . Sciatica    left leg pinched nerve numb at times about once a year    PAST SURGICAL HISTORY: Past Surgical History:  Procedure Laterality Date  . BILIARY STENT PLACEMENT N/A 09/01/2016   Procedure: BILIARY STENT PLACEMENT;  Surgeon: [name], MD;  Location: [facility];  Service: Endoscopy;  Laterality: N/A;  . DIAGNOSTIC LAPAROSCOPIC LIVER BIOPSY  09/16/2016   Procedure: DIAGNOSTIC LAPAROSCOPIC LIVER BIOPSY;  Surgeon: [name], MD;  Location: [facility];  Service: General;;  . ERCP N/A 07/09/2016   Procedure: ENDOSCOPIC RETROGRADE CHOLANGIOPANCREATOGRAPHY (ERCP);  Surgeon: [name], MD;  Location: [facility];  Service: Endoscopy;  Laterality: N/A;  . ERCP N/A 09/01/2016   Procedure: ENDOSCOPIC RETROGRADE CHOLANGIOPANCREATOGRAPHY (ERCP);  Surgeon: [name], MD;  Location: [facility];  Service: Endoscopy;  Laterality: N/A;  . ERCP N/A 06/28/2017   Procedure: ENDOSCOPIC RETROGRADE CHOLANGIOPANCREATOGRAPHY (ERCP);  Surgeon: [name], MD;  Location[facility];  Service: Endoscopy;  Laterality: N/A;  . ESOPHAGOGASTRODUODENOSCOPY (EGD) WITH PROPOFOL N/A 09/01/2016   Procedure: ESOPHAGOGASTRODUODENOSCOPY (EGD) WITH PROPOFOL;  Surgeon: [name], MD;  Location: [facility];  Service: Gastroenterology;  Laterality: N/A;  . EUS N/A 09/01/2016   Procedure: ESOPHAGEAL ENDOSCOPIC ULTRASOUND (EUS) RADIAL;  Surgeon: [name], MD;  Location: [facility];  Service: Endoscopy;  Laterality: N/A;  . FINE NEEDLE ASPIRATION N/A 09/01/2016   Procedure: FINE NEEDLE ASPIRATION (FNA) RADIAL;  Surgeon: [name], MD;  Location: [facility];  Service: Endoscopy;  Laterality: N/A;  . FOREIGN BODY REMOVAL N/A 09/01/2016   Procedure:  FOREIGN BODY REMOVAL;  Surgeon: [name], MD;  Location: [facility];  Service: Gastroenterology;  Laterality: N/A;  . KNEE SURGERY Right 2002   arthroscopy  . PORTA CATH INSERTION N/A 09/20/2017   Procedure: PORTA CATH INSERTION;  Surgeon: [name], MD;  Location: [place] [facility];  Service: Cardiovascular;  Laterality: N/A;  . SHOULDER SURGERY Right 2002    FAMILY HISTORY: Family History  Problem Relation Age of Onset  . Heart disease Father     ADVANCED DIRECTIVES (Y/N):  N  HEALTH MAINTENANCE: Social History   Tobacco Use  . Smoking status: Current Every Day Smoker    Packs/day: 1.00    Years: 40.00    Pack years: 40.00    Types: Cigarettes  . Smokeless tobacco: Never Used  Substance Use Topics  . Alcohol use: Yes    Alcohol/week: 7.0 standard drinks    Types: 7 Cans of beer per week    Comment: last drink 5 weeeks ago  april 2018  . Drug use: No     Colonoscopy:  PAP:  Bone density:  Lipid panel:  Allergies  Allergen Reactions  . No Known Allergies     Current Outpatient Medications  Medication Sig Dispense Refill  . albuterol (PROVENTIL HFA;VENTOLIN HFA) 108 (90 Base) MCG/ACT inhaler Inhale 1-2 puffs into the lungs every 6 (six) hours as needed for wheezing or shortness of breath. 1 Inhaler 6  . Ipratropium-Albuterol (COMBIVENT RESPIMAT) 20-100 MCG/ACT AERS respimat Inhale 1 puff into the lungs 4 (four) times daily. 1 Inhaler 6  . megestrol (MEGACE) 40 MG tablet Take 1 tablet (40 mg total) by mouth daily. 30 tablet 2  . XELODA 500 MG tablet TAKE 3 TABLETS (1,500 MG TOTAL) BY MOUTH 2 (TWO) TIMES DAILY AFTER A MEAL. TAKE FOR 14 DAYS ON, THEN 7 DAYS OFF. 84 tablet 2   No current facility-administered medications for this visit.    OBJECTIVE: Vitals:   12/07/17 0950  BP: 116/79  Pulse: 87  Resp: 18  Temp: (!) 97.5 F (36.4 C)     Body mass index is 15.23 kg/m.    ECOG FS:1 - Symptomatic but completely ambulatory  General: Thin, no  acute distress. Eyes: Pink conjunctiva, anicteric sclera. HEENT: Normocephalic, moist mucous membranes. Lungs: Clear to auscultation bilaterally. Heart: Regular rate and rhythm. No rubs, murmurs, or gallops. Abdomen: Soft, nontender, nondistended. No organomegaly noted, normoactive bowel sounds. Musculoskeletal: No edema, cyanosis, or clubbing. Neuro: Alert, answering all questions appropriately. Cranial nerves grossly intact. Skin: No rashes or petechiae noted. Psych: Normal affect.   LAB RESULTS:  Lab Results  Component Value Date   NA 131 (L) 12/07/2017   K 3.9 12/07/2017   CL 95 (L) 12/07/2017   CO2 26 12/07/2017   GLUCOSE 101 (H) 12/07/2017   BUN 6 12/07/2017   CREATININE 0.30 (L) 12/07/2017   CALCIUM 9.1 12/07/2017   PROT 7.2 12/07/2017   ALBUMIN 3.7 12/07/2017   AST 142 (H) 12/07/2017   ALT 140 (H) 12/07/2017   ALKPHOS 901 (H) 12/07/2017   BILITOT 0.7 12/07/2017   GFRNONAA >60 12/07/2017   GFRAA >60 12/07/2017    Lab Results  Component Value Date   WBC 6.1 12/07/2017   NEUTROABS 2.8 12/07/2017   HGB 15.8 12/07/2017   HCT 46.0 12/07/2017   MCV 95.7 12/07/2017   PLT 223 12/07/2017      STUDIES: Ct  Chest W Contrast  Result Date: 12/02/2017 CLINICAL DATA:  Stage IV pancreatic cancer with liver and gallbladder metastases presents for restaging with interval chemotherapy. EXAM: CT CHEST, ABDOMEN, AND PELVIS WITH CONTRAST TECHNIQUE: Multidetector CT imaging of the chest, abdomen and pelvis was performed following the standard protocol during bolus administration of intravenous contrast. CONTRAST:  57mL ISOVUE-300 IOPAMIDOL (ISOVUE-300) INJECTION 61% COMPARISON:  07/07/2017 CT abdomen/pelvis. 04/12/2017 PET-CT. 01/03/2017 CT chest, abdomen and pelvis. FINDINGS: CT CHEST FINDINGS Cardiovascular: Normal heart size. No significant pericardial effusion/thickening. Right internal jugular MediPort terminates at the cavoatrial junction. Atherosclerotic nonaneurysmal thoracic  aorta. Normal caliber pulmonary arteries. No central pulmonary emboli. Mediastinum/Nodes: No discrete thyroid nodules. Mild oral contrast in the mid to lower thoracic esophagus. No pathologically enlarged axillary, mediastinal or hilar lymph nodes. Lungs/Pleura: No pneumothorax. No pleural effusion. Moderate centrilobular and paraseptal emphysema with diffuse bronchial wall thickening. Anterior right lower lobe 4 mm solid pulmonary nodule (series 3/image 111) and medial left lower lobe 5 mm solid pulmonary nodule (series 3/image 130) are both stable since at least 01/03/2017 chest CT. No acute consolidative airspace disease, lung masses or new significant pulmonary nodules. Stable parenchymal band in medial left lower lobe compatible with postinfectious/postinflammatory scarring. Musculoskeletal: No aggressive appearing focal osseous lesions. Mild thoracic spondylosis. CT ABDOMEN PELVIS FINDINGS Hepatobiliary: Normal liver size. No liver masses. Normal gallbladder. No radiopaque cholelithiasis. No evidence of a recurrent mass along the gallbladder. Mild diffuse intrahepatic biliary ductal dilatation appears decreased since 07/07/2017 CT. Well-positioned lower CBD stent with tip in the duodenal lumen. Common hepatic duct diameter 9 mm, decreased from 11 mm. Minimal pneumobilia scattered in the CBD stent. Pancreas: Poorly marginated hypoenhancing 2.9 x 1.9 cm posterior pancreatic head mass (series 2/image 76), previously 3.1 x 2.2 cm on 07/07/2017 CT using similar measurement technique, mildly decreased. Hypodense pancreatic tail 0.8 cm lesion (series 2/image 75) is stable. No new pancreatic lesions. Stable top-normal pancreatic duct caliber. Spleen: Normal size. No mass. Adrenals/Urinary Tract: Normal adrenals. Normal kidneys with no hydronephrosis and no renal mass. Normal bladder. Stomach/Bowel: Small hiatal hernia. Otherwise normal nondistended stomach. Normal caliber small bowel with no small bowel wall  thickening. Normal appendix. Mild sigmoid diverticulosis, with no large bowel wall thickening or significant pericolonic fat stranding. Vascular/Lymphatic: Atherosclerotic nonaneurysmal abdominal aorta. Patent hepatic portal, splenic and renal veins. Stable mildly enlarged 1.2 cm portacaval node (series 2/image 71). No new pathologically enlarged lymph nodes in the abdomen or pelvis. Reproductive: Mildly enlarged prostate. Other: No pneumoperitoneum, ascites or focal fluid collection. Musculoskeletal: No aggressive appearing focal osseous lesions. IMPRESSION: 1. Posterior pancreatic head tumor is mildly decreased in size since 07/07/2017 CT. 2. Well-positioned CBD stent. Mild intrahepatic biliary ductal dilatation is decreased. 3. Stable mild portacaval adenopathy. No new or progressive metastatic disease in the abdomen or pelvis. No recurrent liver or gallbladder metastases. 4. No definite metastatic disease in the chest. Tiny lower lobe pulmonary nodules are stable since 01/03/2017 CT, more likely benign. 5. Aortic Atherosclerosis (ICD10-I70.0) and Emphysema (ICD10-J43.9). Electronically Signed   By: Ilona Sorrel M.D.   On: 12/02/2017 14:02   Ct Abdomen Pelvis W Contrast  Result Date: 12/02/2017 CLINICAL DATA:  Stage IV pancreatic cancer with liver and gallbladder metastases presents for restaging with interval chemotherapy. EXAM: CT CHEST, ABDOMEN, AND PELVIS WITH CONTRAST TECHNIQUE: Multidetector CT imaging of the chest, abdomen and pelvis was performed following the standard protocol during bolus administration of intravenous contrast. CONTRAST:  99mL ISOVUE-300 IOPAMIDOL (ISOVUE-300) INJECTION 61% COMPARISON:  07/07/2017 CT abdomen/pelvis. 04/12/2017 PET-CT.  01/03/2017 CT chest, abdomen and pelvis. FINDINGS: CT CHEST FINDINGS Cardiovascular: Normal heart size. No significant pericardial effusion/thickening. Right internal jugular MediPort terminates at the cavoatrial junction. Atherosclerotic nonaneurysmal  thoracic aorta. Normal caliber pulmonary arteries. No central pulmonary emboli. Mediastinum/Nodes: No discrete thyroid nodules. Mild oral contrast in the mid to lower thoracic esophagus. No pathologically enlarged axillary, mediastinal or hilar lymph nodes. Lungs/Pleura: No pneumothorax. No pleural effusion. Moderate centrilobular and paraseptal emphysema with diffuse bronchial wall thickening. Anterior right lower lobe 4 mm solid pulmonary nodule (series 3/image 111) and medial left lower lobe 5 mm solid pulmonary nodule (series 3/image 130) are both stable since at least 01/03/2017 chest CT. No acute consolidative airspace disease, lung masses or new significant pulmonary nodules. Stable parenchymal band in medial left lower lobe compatible with postinfectious/postinflammatory scarring. Musculoskeletal: No aggressive appearing focal osseous lesions. Mild thoracic spondylosis. CT ABDOMEN PELVIS FINDINGS Hepatobiliary: Normal liver size. No liver masses. Normal gallbladder. No radiopaque cholelithiasis. No evidence of a recurrent mass along the gallbladder. Mild diffuse intrahepatic biliary ductal dilatation appears decreased since 07/07/2017 CT. Well-positioned lower CBD stent with tip in the duodenal lumen. Common hepatic duct diameter 9 mm, decreased from 11 mm. Minimal pneumobilia scattered in the CBD stent. Pancreas: Poorly marginated hypoenhancing 2.9 x 1.9 cm posterior pancreatic head mass (series 2/image 76), previously 3.1 x 2.2 cm on 07/07/2017 CT using similar measurement technique, mildly decreased. Hypodense pancreatic tail 0.8 cm lesion (series 2/image 75) is stable. No new pancreatic lesions. Stable top-normal pancreatic duct caliber. Spleen: Normal size. No mass. Adrenals/Urinary Tract: Normal adrenals. Normal kidneys with no hydronephrosis and no renal mass. Normal bladder. Stomach/Bowel: Small hiatal hernia. Otherwise normal nondistended stomach. Normal caliber small bowel with no small bowel wall  thickening. Normal appendix. Mild sigmoid diverticulosis, with no large bowel wall thickening or significant pericolonic fat stranding. Vascular/Lymphatic: Atherosclerotic nonaneurysmal abdominal aorta. Patent hepatic portal, splenic and renal veins. Stable mildly enlarged 1.2 cm portacaval node (series 2/image 71). No new pathologically enlarged lymph nodes in the abdomen or pelvis. Reproductive: Mildly enlarged prostate. Other: No pneumoperitoneum, ascites or focal fluid collection. Musculoskeletal: No aggressive appearing focal osseous lesions. IMPRESSION: 1. Posterior pancreatic head tumor is mildly decreased in size since 07/07/2017 CT. 2. Well-positioned CBD stent. Mild intrahepatic biliary ductal dilatation is decreased. 3. Stable mild portacaval adenopathy. No new or progressive metastatic disease in the abdomen or pelvis. No recurrent liver or gallbladder metastases. 4. No definite metastatic disease in the chest. Tiny lower lobe pulmonary nodules are stable since 01/03/2017 CT, more likely benign. 5. Aortic Atherosclerosis (ICD10-I70.0) and Emphysema (ICD10-J43.9). Electronically Signed   By: Ilona Sorrel M.D.   On: 12/02/2017 14:02    ASSESSMENT: Stage IV pancreatic cancer  PLAN:   1. Stage IV pancreatic cancer: Imaging, pathology, and Op note reviewed independently confirming stage IV disease with multiple peritoneal implants.  CT scan results from December 02, 2017 reviewed independently and report as above with mild improvement in pancreatic head tumor.  No new or progressive metastatic disease noted.  Despite this, his CA 19-9 has increased slightly to 377.  Proceed with cycle 7 of CAPEOX today.  He will receive oxaliplatin today and 14 days of capecitabine.  Patient will then have 7 days off.  Can always reconsider modified FOLFIRINOX (oxaliplatin 85 mg/m, leucovorin 400 mg/m, irinotecan 150 mg/m, 5-FU 2400 mg/m over 46 hours) every 2 weeks in the future.  Return to clinic in 3 weeks for  further evaluation and consideration of cycle 8.  We  will reimage in December 2019. 2.  Elevated liver enzymes: AST and ALT are trending down again.  Monitor and proceed with treatment as above. Patient has not been evaluated by GI secondary to transportation issues. 3. Hyponatremia: Chronic.  Sodium mildly improved to 131 today.   4. Peripheral edema: Continue elevation nightly.  Lower extremity ultrasound did not reveal DVT.   5.  Elevated bilirubin: Resolved. 6.  Pain: Resolved.  Patient no longer is taking narcotics. 7.  Poor appetite: Patient has Megace prescribed, but is unclear his compliance.   8.  Shortness of breath: Patient does not complain of this today.  Continue inhalers as prescribed.  Patient expressed understanding and was in agreement with this plan. He also understands that He can call clinic at any time with any questions, concerns, or complaints.   Cancer Staging Pancreatic cancer [facility]) Staging form: Exocrine Pancreas, AJCC 8th Edition - Clinical stage from 09/25/2016: Stage IV (cTX, cNX, pM1) - Signed by Lloyd Huger, MD on 09/25/2016   Lloyd Huger, MD   12/08/2017 9:26 AM

## 2017-12-05 ENCOUNTER — Other Ambulatory Visit: Payer: Self-pay | Admitting: Oncology

## 2017-12-07 ENCOUNTER — Encounter: Payer: Self-pay | Admitting: Oncology

## 2017-12-07 ENCOUNTER — Inpatient Hospital Stay (HOSPITAL_BASED_OUTPATIENT_CLINIC_OR_DEPARTMENT_OTHER): Payer: Medicaid Other | Admitting: Oncology

## 2017-12-07 ENCOUNTER — Inpatient Hospital Stay: Payer: Medicaid Other | Attending: Oncology

## 2017-12-07 ENCOUNTER — Inpatient Hospital Stay: Payer: Medicaid Other

## 2017-12-07 VITALS — BP 116/79 | HR 87 | Temp 97.5°F | Resp 18 | Wt 112.3 lb

## 2017-12-07 DIAGNOSIS — R945 Abnormal results of liver function studies: Secondary | ICD-10-CM

## 2017-12-07 DIAGNOSIS — C786 Secondary malignant neoplasm of retroperitoneum and peritoneum: Secondary | ICD-10-CM

## 2017-12-07 DIAGNOSIS — Z5111 Encounter for antineoplastic chemotherapy: Secondary | ICD-10-CM | POA: Diagnosis present

## 2017-12-07 DIAGNOSIS — C259 Malignant neoplasm of pancreas, unspecified: Secondary | ICD-10-CM | POA: Diagnosis present

## 2017-12-07 DIAGNOSIS — F5089 Other specified eating disorder: Secondary | ICD-10-CM

## 2017-12-07 DIAGNOSIS — C250 Malignant neoplasm of head of pancreas: Secondary | ICD-10-CM

## 2017-12-07 DIAGNOSIS — R600 Localized edema: Secondary | ICD-10-CM

## 2017-12-07 DIAGNOSIS — R978 Other abnormal tumor markers: Secondary | ICD-10-CM | POA: Diagnosis not present

## 2017-12-07 DIAGNOSIS — E871 Hypo-osmolality and hyponatremia: Secondary | ICD-10-CM

## 2017-12-07 LAB — CBC WITH DIFFERENTIAL/PLATELET
BASOS ABS: 0.1 10*3/uL (ref 0–0.1)
Basophils Relative: 1 %
EOS PCT: 12 %
Eosinophils Absolute: 0.7 10*3/uL (ref 0–0.7)
HCT: 46 % (ref 40.0–52.0)
Hemoglobin: 15.8 g/dL (ref 13.0–18.0)
LYMPHS ABS: 1.7 10*3/uL (ref 1.0–3.6)
LYMPHS PCT: 28 %
MCH: 32.8 pg (ref 26.0–34.0)
MCHC: 34.3 g/dL (ref 32.0–36.0)
MCV: 95.7 fL (ref 80.0–100.0)
MONO ABS: 0.8 10*3/uL (ref 0.2–1.0)
Monocytes Relative: 13 %
Neutro Abs: 2.8 10*3/uL (ref 1.4–6.5)
Neutrophils Relative %: 46 %
Platelets: 223 10*3/uL (ref 150–440)
RBC: 4.81 MIL/uL (ref 4.40–5.90)
RDW: 13.9 % (ref 11.5–14.5)
WBC: 6.1 10*3/uL (ref 3.8–10.6)

## 2017-12-07 LAB — COMPREHENSIVE METABOLIC PANEL
ALBUMIN: 3.7 g/dL (ref 3.5–5.0)
ALT: 140 U/L — ABNORMAL HIGH (ref 0–44)
ANION GAP: 10 (ref 5–15)
AST: 142 U/L — AB (ref 15–41)
Alkaline Phosphatase: 901 U/L — ABNORMAL HIGH (ref 38–126)
BILIRUBIN TOTAL: 0.7 mg/dL (ref 0.3–1.2)
BUN: 6 mg/dL (ref 6–20)
CHLORIDE: 95 mmol/L — AB (ref 98–111)
CO2: 26 mmol/L (ref 22–32)
Calcium: 9.1 mg/dL (ref 8.9–10.3)
Creatinine, Ser: 0.3 mg/dL — ABNORMAL LOW (ref 0.61–1.24)
GFR calc Af Amer: >60 mL/min (ref >60)
GFR calc non Af Amer: >60 mL/min (ref >60)
GLUCOSE: 101 mg/dL — AB (ref 70–99)
POTASSIUM: 3.9 mmol/L (ref 3.5–5.1)
SODIUM: 131 mmol/L — AB (ref 135–145)
TOTAL PROTEIN: 7.2 g/dL (ref 6.5–8.1)

## 2017-12-07 LAB — MAGNESIUM: MAGNESIUM: 1.7 mg/dL (ref 1.7–2.4)

## 2017-12-07 MED ORDER — HEPARIN SOD (PORK) LOCK FLUSH 100 UNIT/ML IV SOLN
500.0000 [IU] | Freq: Once | INTRAVENOUS | Status: AC | PRN
  Administered 2017-12-07: 500 [IU]
  Filled 2017-12-07: qty 5

## 2017-12-07 MED ORDER — DEXAMETHASONE SODIUM PHOSPHATE 10 MG/ML IJ SOLN
10.0000 mg | Freq: Once | INTRAMUSCULAR | Status: AC
  Administered 2017-12-07: 10 mg via INTRAVENOUS
  Filled 2017-12-07: qty 1

## 2017-12-07 MED ORDER — PALONOSETRON HCL INJECTION 0.25 MG/5ML
0.2500 mg | Freq: Once | INTRAVENOUS | Status: AC
  Administered 2017-12-07: 0.25 mg via INTRAVENOUS
  Filled 2017-12-07: qty 5

## 2017-12-07 MED ORDER — OXALIPLATIN CHEMO INJECTION 100 MG/20ML
150.0000 mg | Freq: Once | INTRAVENOUS | Status: AC
  Administered 2017-12-07: 150 mg via INTRAVENOUS
  Filled 2017-12-07: qty 20

## 2017-12-07 MED ORDER — DEXTROSE 5 % IV SOLN
Freq: Once | INTRAVENOUS | Status: AC
  Administered 2017-12-07: 11:00:00 via INTRAVENOUS
  Filled 2017-12-07: qty 250

## 2017-12-07 MED ORDER — SODIUM CHLORIDE 0.9 % IV SOLN: 10.0000 mg | Freq: Once | INTRAVENOUS | Status: DC

## 2017-12-07 NOTE — Progress Notes (Signed)
Patient denies any concerns today.  

## 2017-12-08 LAB — CANCER ANTIGEN 19-9: CA 19-9: 377 U/mL — ABNORMAL HIGH (ref 0–35)

## 2017-12-15 MED FILL — XELODA 500 MG TABLET: 500 | 21 days supply | Qty: 84 | Fill #1

## 2017-12-26 NOTE — Progress Notes (Signed)
Benjamin Diaz  Telephone:([phone] [phone]  ID: Benjamin Diaz. OB: 1960/12/29  MR#: [id number]  KKX#:[id number]  Patient Care Team: Patient, [name] as PCP - General (General Practice) Benjamin Napoleon, MD as Consulting Physician (Oncology) Benjamin Irani, MD (Inactive) as Consulting Physician (Gastroenterology) Benjamin Silence, MD as Consulting Physician (Gastroenterology)  CHIEF COMPLAINT: Stage IV pancreatic cancer  INTERVAL HISTORY: Patient returns to clinic today for further evaluation and consideration of cycle 8 of CAPOX.  He continues to tolerate his treatments well without significant side effects.  He has chronic weakness and fatigue that is unchanged. He has a poor appetite, but no weight loss. He has no neurologic complaints. He denies any recent fevers or illnesses.  He denies any chest pain, shortness of breath, cough, or hemoptysis.  He denies any nausea, vomiting, constipation, or diarrhea. He has no urinary complaints.  Patient offers no further specific complaints today.  REVIEW OF SYSTEMS:   Review of Systems  Constitutional: Positive for malaise/fatigue. Negative for fever and weight loss.  Respiratory: Negative.  Negative for cough and shortness of breath.   Cardiovascular: Negative.  Negative for chest pain and leg swelling.  Gastrointestinal: Negative.  Negative for abdominal pain, diarrhea, nausea and vomiting.  Genitourinary: Negative.  Negative for dysuria, frequency and urgency.  Musculoskeletal: Negative.  Negative for joint pain.  Skin: Negative.  Negative for itching and rash.  Neurological: Positive for weakness. Negative for sensory change and focal weakness.  Psychiatric/Behavioral: Negative.  The patient is not nervous/anxious.     As per HPI. Otherwise, a complete review of systems is negative.  PAST MEDICAL HISTORY: Past Medical History:  Diagnosis Date  . Arthritis   . Asthma   . Atrial fibrillation ([place])     . Closed left ankle fracture age 79  . COPD (chronic obstructive pulmonary disease) ([place])   . Dysrhythmia   . Pancreatic cancer ([place]) 06/2016   Chemo tx's.   . Pneumonia 2011  . Right shoulder injury 09/27/2016  . Sciatica    left leg pinched nerve numb at times about once a year    PAST SURGICAL HISTORY: Past Surgical History:  Procedure Laterality Date  . BILIARY STENT PLACEMENT N/A 09/01/2016   Procedure: BILIARY STENT PLACEMENT;  Surgeon: Benjamin Silence, MD;  Location: [facility];  Service: Endoscopy;  Laterality: N/A;  . DIAGNOSTIC LAPAROSCOPIC LIVER BIOPSY  09/16/2016   Procedure: DIAGNOSTIC LAPAROSCOPIC LIVER BIOPSY;  Surgeon: Benjamin Klein, MD;  Location: [facility];  Service: General;;  . ERCP N/A 07/09/2016   Procedure: ENDOSCOPIC RETROGRADE CHOLANGIOPANCREATOGRAPHY (ERCP);  Surgeon: Benjamin Irani, MD;  Location: [facility];  Service: Endoscopy;  Laterality: N/A;  . ERCP N/A 09/01/2016   Procedure: ENDOSCOPIC RETROGRADE CHOLANGIOPANCREATOGRAPHY (ERCP);  Surgeon: Benjamin Silence, MD;  Location: [facility];  Service: Endoscopy;  Laterality: N/A;  . ERCP N/A 06/28/2017   Procedure: ENDOSCOPIC RETROGRADE CHOLANGIOPANCREATOGRAPHY (ERCP);  Surgeon: Benjamin Lame, MD;  Location: [facility] ENDOSCOPY;  Service: Endoscopy;  Laterality: N/A;  . ESOPHAGOGASTRODUODENOSCOPY (EGD) WITH PROPOFOL N/A 09/01/2016   Procedure: ESOPHAGOGASTRODUODENOSCOPY (EGD) WITH PROPOFOL;  Surgeon: Benjamin Juniper, MD;  Location: [facility];  Service: Gastroenterology;  Laterality: N/A;  . EUS N/A 09/01/2016   Procedure: ESOPHAGEAL ENDOSCOPIC ULTRASOUND (EUS) RADIAL;  Surgeon: Benjamin Silence, MD;  Location: [facility];  Service: Endoscopy;  Laterality: N/A;  . FINE NEEDLE ASPIRATION N/A 09/01/2016   Procedure: FINE NEEDLE ASPIRATION (FNA) RADIAL;  Surgeon: Benjamin Silence, MD;  Location: [facility];  Service: Endoscopy;  Laterality:  N/A;  . FOREIGN BODY REMOVAL N/A 09/01/2016   Procedure: FOREIGN BODY REMOVAL;  Surgeon: Benjamin Juniper, MD;  Location: [facility];  Service: Gastroenterology;  Laterality: N/A;  . KNEE SURGERY Right 2002   arthroscopy  . PORTA CATH INSERTION N/A 09/20/2017   Procedure: PORTA CATH INSERTION;  Surgeon: Benjamin Cabal, MD;  Location: [facility];  Service: Cardiovascular;  Laterality: N/A;  . SHOULDER SURGERY Right 2002    FAMILY HISTORY: Family History  Problem Relation Age of Onset  . Heart disease Father     ADVANCED DIRECTIVES (Y/N):  N  HEALTH MAINTENANCE: Social History   Tobacco Use  . Smoking status: Current Every Day Smoker    Packs/day: 1.00    Years: 40.00    Pack years: 40.00    Types: Cigarettes  . Smokeless tobacco: Never Used  Substance Use Topics  . Alcohol use: Yes    Alcohol/week: 7.0 standard drinks    Types: 7 Cans of beer per week    Comment: last drink 5 weeeks ago  april 2018  . Drug use: No     Colonoscopy:  PAP:  Bone density:  Lipid panel:  Allergies  Allergen Reactions  . No Known Allergies     Current Outpatient Medications  Medication Sig Dispense Refill  . albuterol (PROVENTIL HFA;VENTOLIN HFA) 108 (90 Base) MCG/ACT inhaler Inhale 1-2 puffs into the lungs every 6 (six) hours as needed for wheezing or shortness of breath. 1 Inhaler 6  . Ipratropium-Albuterol (COMBIVENT RESPIMAT) 20-100 MCG/ACT AERS respimat Inhale 1 puff into the lungs 4 (four) times daily. 1 Inhaler 6  . XELODA 500 MG tablet TAKE 3 TABLETS (1,500 MG TOTAL) BY MOUTH 2 (TWO) TIMES DAILY AFTER A MEAL. TAKE FOR 14 DAYS ON, THEN 7 DAYS OFF. 84 tablet 2  . megestrol (MEGACE) 40 MG tablet Take 1 tablet (40 mg total) by mouth daily. (Patient not taking: Reported on 12/28/2017) 30 tablet 2   No current facility-administered medications for this visit.    OBJECTIVE: Vitals:   12/28/17 0926  BP: 103/78  Pulse: (!) 101  Resp: 18  Temp: (!) 94.9 F (34.9 C)     Body mass index is 15.31 kg/m.    ECOG FS:1 - Symptomatic but completely ambulatory  General:  Thin, no acute distress. Eyes: Pink conjunctiva, anicteric sclera. HEENT: Normocephalic, moist mucous membranes, clear oropharnyx. Lungs: Clear to auscultation bilaterally. Heart: Regular rate and rhythm. No rubs, murmurs, or gallops. Abdomen: Soft, nontender, nondistended. No organomegaly noted, normoactive bowel sounds. Musculoskeletal: No edema, cyanosis, or clubbing. Neuro: Alert, answering all questions appropriately. Cranial nerves grossly intact. Skin: No rashes or petechiae noted. Psych: Normal affect.  LAB RESULTS:  Lab Results  Component Value Date   NA 131 (L) 12/28/2017   K 4.1 12/28/2017   CL 95 (L) 12/28/2017   CO2 25 12/28/2017   GLUCOSE 115 (H) 12/28/2017   BUN 7 12/28/2017   CREATININE 0.40 (L) 12/28/2017   CALCIUM 9.5 12/28/2017   PROT 7.4 12/28/2017   ALBUMIN 3.7 12/28/2017   AST 151 (H) 12/28/2017   ALT 133 (H) 12/28/2017   ALKPHOS 1,146 (H) 12/28/2017   BILITOT 0.7 12/28/2017   GFRNONAA >60 12/28/2017   GFRAA >60 12/28/2017    Lab Results  Component Value Date   WBC 7.2 12/28/2017   NEUTROABS 3.7 12/28/2017   HGB 16.5 12/28/2017   HCT 47.4 12/28/2017   MCV 93.7 12/28/2017   PLT 245 12/28/2017  STUDIES: Ct Chest W Contrast  Result Date: 12/02/2017 CLINICAL DATA:  Stage IV pancreatic cancer with liver and gallbladder metastases presents for restaging with interval chemotherapy. EXAM: CT CHEST, ABDOMEN, AND PELVIS WITH CONTRAST TECHNIQUE: Multidetector CT imaging of the chest, abdomen and pelvis was performed following the standard protocol during bolus administration of intravenous contrast. CONTRAST:  54mL ISOVUE-300 IOPAMIDOL (ISOVUE-300) INJECTION 61% COMPARISON:  07/07/2017 CT abdomen/pelvis. 04/12/2017 PET-CT. 01/03/2017 CT chest, abdomen and pelvis. FINDINGS: CT CHEST FINDINGS Cardiovascular: Normal heart size. No significant pericardial effusion/thickening. Right internal jugular MediPort terminates at the cavoatrial junction.  Atherosclerotic nonaneurysmal thoracic aorta. Normal caliber pulmonary arteries. No central pulmonary emboli. Mediastinum/Nodes: No discrete thyroid nodules. Mild oral contrast in the mid to lower thoracic esophagus. No pathologically enlarged axillary, mediastinal or hilar lymph nodes. Lungs/Pleura: No pneumothorax. No pleural effusion. Moderate centrilobular and paraseptal emphysema with diffuse bronchial wall thickening. Anterior right lower lobe 4 mm solid pulmonary nodule (series 3/image 111) and medial left lower lobe 5 mm solid pulmonary nodule (series 3/image 130) are both stable since at least 01/03/2017 chest CT. No acute consolidative airspace disease, lung masses or new significant pulmonary nodules. Stable parenchymal band in medial left lower lobe compatible with postinfectious/postinflammatory scarring. Musculoskeletal: No aggressive appearing focal osseous lesions. Mild thoracic spondylosis. CT ABDOMEN PELVIS FINDINGS Hepatobiliary: Normal liver size. No liver masses. Normal gallbladder. No radiopaque cholelithiasis. No evidence of a recurrent mass along the gallbladder. Mild diffuse intrahepatic biliary ductal dilatation appears decreased since 07/07/2017 CT. Well-positioned lower CBD stent with tip in the duodenal lumen. Common hepatic duct diameter 9 mm, decreased from 11 mm. Minimal pneumobilia scattered in the CBD stent. Pancreas: Poorly marginated hypoenhancing 2.9 x 1.9 cm posterior pancreatic head mass (series 2/image 76), previously 3.1 x 2.2 cm on 07/07/2017 CT using similar measurement technique, mildly decreased. Hypodense pancreatic tail 0.8 cm lesion (series 2/image 75) is stable. No new pancreatic lesions. Stable top-normal pancreatic duct caliber. Spleen: Normal size. No mass. Adrenals/Urinary Tract: Normal adrenals. Normal kidneys with no hydronephrosis and no renal mass. Normal bladder. Stomach/Bowel: Small hiatal hernia. Otherwise normal nondistended stomach. Normal caliber small  bowel with no small bowel wall thickening. Normal appendix. Mild sigmoid diverticulosis, with no large bowel wall thickening or significant pericolonic fat stranding. Vascular/Lymphatic: Atherosclerotic nonaneurysmal abdominal aorta. Patent hepatic portal, splenic and renal veins. Stable mildly enlarged 1.2 cm portacaval node (series 2/image 71). No new pathologically enlarged lymph nodes in the abdomen or pelvis. Reproductive: Mildly enlarged prostate. Other: No pneumoperitoneum, ascites or focal fluid collection. Musculoskeletal: No aggressive appearing focal osseous lesions. IMPRESSION: 1. Posterior pancreatic head tumor is mildly decreased in size since 07/07/2017 CT. 2. Well-positioned CBD stent. Mild intrahepatic biliary ductal dilatation is decreased. 3. Stable mild portacaval adenopathy. No new or progressive metastatic disease in the abdomen or pelvis. No recurrent liver or gallbladder metastases. 4. No definite metastatic disease in the chest. Tiny lower lobe pulmonary nodules are stable since 01/03/2017 CT, more likely benign. 5. Aortic Atherosclerosis (ICD10-I70.0) and Emphysema (ICD10-J43.9). Electronically Signed   By: Ilona Sorrel M.D.   On: 12/02/2017 14:02   Ct Abdomen Pelvis W Contrast  Result Date: 12/02/2017 CLINICAL DATA:  Stage IV pancreatic cancer with liver and gallbladder metastases presents for restaging with interval chemotherapy. EXAM: CT CHEST, ABDOMEN, AND PELVIS WITH CONTRAST TECHNIQUE: Multidetector CT imaging of the chest, abdomen and pelvis was performed following the standard protocol during bolus administration of intravenous contrast. CONTRAST:  57mL ISOVUE-300 IOPAMIDOL (ISOVUE-300) INJECTION 61% COMPARISON:  07/07/2017 CT abdomen/pelvis.  04/12/2017 PET-CT. 01/03/2017 CT chest, abdomen and pelvis. FINDINGS: CT CHEST FINDINGS Cardiovascular: Normal heart size. No significant pericardial effusion/thickening. Right internal jugular MediPort terminates at the cavoatrial junction.  Atherosclerotic nonaneurysmal thoracic aorta. Normal caliber pulmonary arteries. No central pulmonary emboli. Mediastinum/Nodes: No discrete thyroid nodules. Mild oral contrast in the mid to lower thoracic esophagus. No pathologically enlarged axillary, mediastinal or hilar lymph nodes. Lungs/Pleura: No pneumothorax. No pleural effusion. Moderate centrilobular and paraseptal emphysema with diffuse bronchial wall thickening. Anterior right lower lobe 4 mm solid pulmonary nodule (series 3/image 111) and medial left lower lobe 5 mm solid pulmonary nodule (series 3/image 130) are both stable since at least 01/03/2017 chest CT. No acute consolidative airspace disease, lung masses or new significant pulmonary nodules. Stable parenchymal band in medial left lower lobe compatible with postinfectious/postinflammatory scarring. Musculoskeletal: No aggressive appearing focal osseous lesions. Mild thoracic spondylosis. CT ABDOMEN PELVIS FINDINGS Hepatobiliary: Normal liver size. No liver masses. Normal gallbladder. No radiopaque cholelithiasis. No evidence of a recurrent mass along the gallbladder. Mild diffuse intrahepatic biliary ductal dilatation appears decreased since 07/07/2017 CT. Well-positioned lower CBD stent with tip in the duodenal lumen. Common hepatic duct diameter 9 mm, decreased from 11 mm. Minimal pneumobilia scattered in the CBD stent. Pancreas: Poorly marginated hypoenhancing 2.9 x 1.9 cm posterior pancreatic head mass (series 2/image 76), previously 3.1 x 2.2 cm on 07/07/2017 CT using similar measurement technique, mildly decreased. Hypodense pancreatic tail 0.8 cm lesion (series 2/image 75) is stable. No new pancreatic lesions. Stable top-normal pancreatic duct caliber. Spleen: Normal size. No mass. Adrenals/Urinary Tract: Normal adrenals. Normal kidneys with no hydronephrosis and no renal mass. Normal bladder. Stomach/Bowel: Small hiatal hernia. Otherwise normal nondistended stomach. Normal caliber small  bowel with no small bowel wall thickening. Normal appendix. Mild sigmoid diverticulosis, with no large bowel wall thickening or significant pericolonic fat stranding. Vascular/Lymphatic: Atherosclerotic nonaneurysmal abdominal aorta. Patent hepatic portal, splenic and renal veins. Stable mildly enlarged 1.2 cm portacaval node (series 2/image 71). No new pathologically enlarged lymph nodes in the abdomen or pelvis. Reproductive: Mildly enlarged prostate. Other: No pneumoperitoneum, ascites or focal fluid collection. Musculoskeletal: No aggressive appearing focal osseous lesions. IMPRESSION: 1. Posterior pancreatic head tumor is mildly decreased in size since 07/07/2017 CT. 2. Well-positioned CBD stent. Mild intrahepatic biliary ductal dilatation is decreased. 3. Stable mild portacaval adenopathy. No new or progressive metastatic disease in the abdomen or pelvis. No recurrent liver or gallbladder metastases. 4. No definite metastatic disease in the chest. Tiny lower lobe pulmonary nodules are stable since 01/03/2017 CT, more likely benign. 5. Aortic Atherosclerosis (ICD10-I70.0) and Emphysema (ICD10-J43.9). Electronically Signed   By: Ilona Sorrel M.D.   On: 12/02/2017 14:02    ASSESSMENT: Stage IV pancreatic cancer  PLAN:   1. Stage IV pancreatic cancer: Imaging, pathology, and Op note reviewed independently confirming stage IV disease with multiple peritoneal implants.  CT scan results from December 02, 2017 reviewed independently and report as above with mild improvement in pancreatic head tumor.  No new or progressive metastatic disease noted.  Despite the improved imaging, patient's CA 19-9 continues to slowly trend up and is now 443.  We will proceed with cycle 8 of CAPEOX today.  He will receive oxaliplatin today and 14 days of oral capecitabine.  Patient will then have 7 days off.  Can always reconsider modified FOLFIRINOX (oxaliplatin 85 mg/m, leucovorin 400 mg/m, irinotecan 150 mg/m, 5-FU 2400  mg/m over 46 hours) every 2 weeks in the future.  Return to clinic in 3  weeks for laboratory work and oxaliplatin only.  Patient will then return to clinic in 6 weeks for further evaluation and consideration of cycle 10.  Plan to reimage in December 2019, but of patient's CA 19-9 continues to increase may reimage sooner. 2.  Elevated liver enzymes: AST and ALT remain persistently elevated.  Monitor and proceed with treatment as above. Patient has not been evaluated by GI secondary to transportation issues. 3. Hyponatremia: Chronic.  Sodium unchanged at 131 today. 4. Peripheral edema: Continue elevation nightly.  Lower extremity ultrasound did not reveal DVT.   5.  Elevated bilirubin: Resolved. 6.  Pain: Resolved.  Patient no longer is taking narcotics. 7.  Poor appetite: Patient has Megace prescribed, but is unclear his compliance.   8.  Shortness of breath: Chronic and unchanged.  Patient does not complain of this today.  Continue inhalers as prescribed.  Patient expressed understanding and was in agreement with this plan. He also understands that He can call clinic at any time with any questions, concerns, or complaints.   Cancer Staging Pancreatic cancer [facility]) Staging form: Exocrine Pancreas, AJCC 8th Edition - Clinical stage from 09/25/2016: Stage IV (cTX, cNX, pM1) - Signed by Lloyd Huger, MD on 09/25/2016   Lloyd Huger, MD   12/31/2017 7:20 AM

## 2017-12-28 ENCOUNTER — Inpatient Hospital Stay: Payer: Medicaid Other

## 2017-12-28 ENCOUNTER — Inpatient Hospital Stay (HOSPITAL_BASED_OUTPATIENT_CLINIC_OR_DEPARTMENT_OTHER): Payer: Medicaid Other | Admitting: Oncology

## 2017-12-28 ENCOUNTER — Inpatient Hospital Stay: Payer: Medicaid Other | Attending: Oncology

## 2017-12-28 ENCOUNTER — Other Ambulatory Visit: Payer: Self-pay

## 2017-12-28 VITALS — BP 103/78 | HR 101 | Temp 94.9°F | Resp 18 | Wt 112.9 lb

## 2017-12-28 DIAGNOSIS — E871 Hypo-osmolality and hyponatremia: Secondary | ICD-10-CM

## 2017-12-28 DIAGNOSIS — R5382 Chronic fatigue, unspecified: Secondary | ICD-10-CM

## 2017-12-28 DIAGNOSIS — C250 Malignant neoplasm of head of pancreas: Secondary | ICD-10-CM | POA: Diagnosis present

## 2017-12-28 DIAGNOSIS — R531 Weakness: Secondary | ICD-10-CM

## 2017-12-28 DIAGNOSIS — C786 Secondary malignant neoplasm of retroperitoneum and peritoneum: Secondary | ICD-10-CM | POA: Diagnosis not present

## 2017-12-28 DIAGNOSIS — C259 Malignant neoplasm of pancreas, unspecified: Secondary | ICD-10-CM

## 2017-12-28 DIAGNOSIS — R600 Localized edema: Secondary | ICD-10-CM | POA: Diagnosis not present

## 2017-12-28 DIAGNOSIS — Z5111 Encounter for antineoplastic chemotherapy: Secondary | ICD-10-CM | POA: Insufficient documentation

## 2017-12-28 DIAGNOSIS — F17210 Nicotine dependence, cigarettes, uncomplicated: Secondary | ICD-10-CM

## 2017-12-28 DIAGNOSIS — R630 Anorexia: Secondary | ICD-10-CM

## 2017-12-28 LAB — CBC WITH DIFFERENTIAL/PLATELET
BASOS ABS: 0.1 10*3/uL (ref 0–0.1)
Basophils Relative: 1 %
Eosinophils Absolute: 0.5 10*3/uL (ref 0–0.7)
Eosinophils Relative: 8 %
HEMATOCRIT: 47.4 % (ref 40.0–52.0)
HEMOGLOBIN: 16.5 g/dL (ref 13.0–18.0)
LYMPHS PCT: 25 %
Lymphs Abs: 1.8 10*3/uL (ref 1.0–3.6)
MCH: 32.6 pg (ref 26.0–34.0)
MCHC: 34.8 g/dL (ref 32.0–36.0)
MCV: 93.7 fL (ref 80.0–100.0)
MONO ABS: 1.1 10*3/uL — AB (ref 0.2–1.0)
MONOS PCT: 15 %
NEUTROS ABS: 3.7 10*3/uL (ref 1.4–6.5)
NEUTROS PCT: 51 %
Platelets: 245 10*3/uL (ref 150–440)
RBC: 5.06 MIL/uL (ref 4.40–5.90)
RDW: 14.2 % (ref 11.5–14.5)
WBC: 7.2 10*3/uL (ref 3.8–10.6)

## 2017-12-28 LAB — COMPREHENSIVE METABOLIC PANEL
ALBUMIN: 3.7 g/dL (ref 3.5–5.0)
ALT: 133 U/L — ABNORMAL HIGH (ref 0–44)
ANION GAP: 11 (ref 5–15)
AST: 151 U/L — ABNORMAL HIGH (ref 15–41)
Alkaline Phosphatase: 1146 U/L — ABNORMAL HIGH (ref 38–126)
BUN: 7 mg/dL (ref 6–20)
CO2: 25 mmol/L (ref 22–32)
Calcium: 9.5 mg/dL (ref 8.9–10.3)
Chloride: 95 mmol/L — ABNORMAL LOW (ref 98–111)
Creatinine, Ser: 0.4 mg/dL — ABNORMAL LOW (ref 0.61–1.24)
GFR calc non Af Amer: >60 mL/min (ref >60)
GLUCOSE: 115 mg/dL — AB (ref 70–99)
POTASSIUM: 4.1 mmol/L (ref 3.5–5.1)
SODIUM: 131 mmol/L — AB (ref 135–145)
Total Bilirubin: 0.7 mg/dL (ref 0.3–1.2)
Total Protein: 7.4 g/dL (ref 6.5–8.1)

## 2017-12-28 LAB — MAGNESIUM: MAGNESIUM: 1.7 mg/dL (ref 1.7–2.4)

## 2017-12-28 MED ORDER — SODIUM CHLORIDE 0.9% FLUSH
10.0000 mL | INTRAVENOUS | Status: DC | PRN
  Administered 2017-12-28: 10 mL
  Filled 2017-12-28: qty 10

## 2017-12-28 MED ORDER — OXALIPLATIN CHEMO INJECTION 100 MG/20ML
150.0000 mg | Freq: Once | INTRAVENOUS | Status: AC
  Administered 2017-12-28: 150 mg via INTRAVENOUS
  Filled 2017-12-28: qty 20

## 2017-12-28 MED ORDER — DEXAMETHASONE SODIUM PHOSPHATE 10 MG/ML IJ SOLN
10.0000 mg | Freq: Once | INTRAMUSCULAR | Status: AC
  Administered 2017-12-28: 10 mg via INTRAVENOUS
  Filled 2017-12-28: qty 1

## 2017-12-28 MED ORDER — DEXTROSE 5 % IV SOLN
Freq: Once | INTRAVENOUS | Status: AC
  Administered 2017-12-28: 10:00:00 via INTRAVENOUS
  Filled 2017-12-28: qty 250

## 2017-12-28 MED ORDER — PALONOSETRON HCL INJECTION 0.25 MG/5ML
0.2500 mg | Freq: Once | INTRAVENOUS | Status: AC
  Administered 2017-12-28: 0.25 mg via INTRAVENOUS
  Filled 2017-12-28: qty 5

## 2017-12-28 MED ORDER — HEPARIN SOD (PORK) LOCK FLUSH 100 UNIT/ML IV SOLN
500.0000 [IU] | Freq: Once | INTRAVENOUS | Status: AC | PRN
  Administered 2017-12-28: 500 [IU]
  Filled 2017-12-28: qty 5

## 2017-12-28 NOTE — Progress Notes (Signed)
Here for follow up. Feeling " alright "per pt

## 2017-12-29 LAB — CANCER ANTIGEN 19-9: CA 19-9: 443 U/mL — ABNORMAL HIGH (ref 0–35)

## 2018-01-09 MED FILL — XELODA 500 MG TABLET: 500 | 21 days supply | Qty: 84 | Fill #2

## 2018-01-18 ENCOUNTER — Inpatient Hospital Stay: Payer: Medicaid Other | Admitting: Oncology

## 2018-01-18 ENCOUNTER — Ambulatory Visit: Payer: Medicaid Other

## 2018-01-18 ENCOUNTER — Ambulatory Visit: Payer: Medicaid Other | Admitting: Oncology

## 2018-01-18 ENCOUNTER — Other Ambulatory Visit: Payer: Medicaid Other

## 2018-01-18 ENCOUNTER — Inpatient Hospital Stay: Payer: Medicaid Other

## 2018-01-18 DIAGNOSIS — Z5111 Encounter for antineoplastic chemotherapy: Secondary | ICD-10-CM | POA: Diagnosis not present

## 2018-01-18 DIAGNOSIS — C259 Malignant neoplasm of pancreas, unspecified: Secondary | ICD-10-CM

## 2018-01-18 LAB — CBC WITH DIFFERENTIAL/PLATELET
ABS IMMATURE GRANULOCYTES: 0.02 10*3/uL (ref 0.00–0.07)
Basophils Absolute: 0.1 10*3/uL (ref 0.0–0.1)
Basophils Relative: 1 %
Eosinophils Absolute: 0.8 10*3/uL — ABNORMAL HIGH (ref 0.0–0.5)
Eosinophils Relative: 11 %
HCT: 44 % (ref 39.0–52.0)
Hemoglobin: 15.3 g/dL (ref 13.0–17.0)
Immature Granulocytes: 0 %
Lymphocytes Relative: 26 %
Lymphs Abs: 1.9 10*3/uL (ref 0.7–4.0)
MCH: 30.4 pg (ref 26.0–34.0)
MCHC: 34.8 g/dL (ref 30.0–36.0)
MCV: 87.5 fL (ref 80.0–100.0)
MONO ABS: 1.2 10*3/uL — AB (ref 0.1–1.0)
MONOS PCT: 16 %
NEUTROS ABS: 3.4 10*3/uL (ref 1.7–7.7)
Neutrophils Relative %: 46 %
PLATELETS: 245 10*3/uL (ref 150–400)
RBC: 5.03 MIL/uL (ref 4.22–5.81)
RDW: 15.2 % (ref 11.5–15.5)
WBC: 7.2 10*3/uL (ref 4.0–10.5)
nRBC: 0 % (ref 0.0–0.2)

## 2018-01-18 LAB — COMPREHENSIVE METABOLIC PANEL
ALK PHOS: 875 U/L — AB (ref 38–126)
ALT: 151 U/L — AB (ref 0–44)
ANION GAP: 7 (ref 5–15)
AST: 137 U/L — ABNORMAL HIGH (ref 15–41)
Albumin: 3.8 g/dL (ref 3.5–5.0)
BILIRUBIN TOTAL: 1.2 mg/dL (ref 0.3–1.2)
BUN: 5 mg/dL — ABNORMAL LOW (ref 6–20)
CALCIUM: 9.6 mg/dL (ref 8.9–10.3)
CO2: 24 mmol/L (ref 22–32)
CREATININE: 0.35 mg/dL — AB (ref 0.61–1.24)
Chloride: 101 mmol/L (ref 98–111)
GFR calc Af Amer: >60 mL/min (ref >60)
GFR calc non Af Amer: >60 mL/min (ref >60)
Glucose, Bld: 116 mg/dL — ABNORMAL HIGH (ref 70–99)
Potassium: 3.7 mmol/L (ref 3.5–5.1)
Sodium: 132 mmol/L — ABNORMAL LOW (ref 135–145)
TOTAL PROTEIN: 7.5 g/dL (ref 6.5–8.1)

## 2018-01-18 LAB — MAGNESIUM: MAGNESIUM: 1.8 mg/dL (ref 1.7–2.4)

## 2018-01-18 MED ORDER — OXALIPLATIN CHEMO INJECTION 100 MG/20ML
150.0000 mg | Freq: Once | INTRAVENOUS | Status: AC
  Administered 2018-01-18: 150 mg via INTRAVENOUS
  Filled 2018-01-18: qty 30

## 2018-01-18 MED ORDER — PALONOSETRON HCL INJECTION 0.25 MG/5ML
0.2500 mg | Freq: Once | INTRAVENOUS | Status: AC
  Administered 2018-01-18: 0.25 mg via INTRAVENOUS
  Filled 2018-01-18: qty 5

## 2018-01-18 MED ORDER — DEXTROSE 5 % IV SOLN
Freq: Once | INTRAVENOUS | Status: AC
  Administered 2018-01-18: 10:00:00 via INTRAVENOUS
  Filled 2018-01-18: qty 250

## 2018-01-18 MED ORDER — SODIUM CHLORIDE 0.9% FLUSH
10.0000 mL | Freq: Once | INTRAVENOUS | Status: DC
  Filled 2018-01-18: qty 10

## 2018-01-18 MED ORDER — DEXAMETHASONE SODIUM PHOSPHATE 10 MG/ML IJ SOLN
10.0000 mg | Freq: Once | INTRAMUSCULAR | Status: AC
  Administered 2018-01-18: 10 mg via INTRAVENOUS
  Filled 2018-01-18: qty 1

## 2018-01-18 MED ORDER — HEPARIN SOD (PORK) LOCK FLUSH 100 UNIT/ML IV SOLN
500.0000 [IU] | Freq: Once | INTRAVENOUS | Status: AC
  Administered 2018-01-18: 500 [IU] via INTRAVENOUS
  Filled 2018-01-18: qty 5

## 2018-01-19 LAB — CANCER ANTIGEN 19-9: CAN 19-9: 462 U/mL — AB (ref 0–35)

## 2018-01-24 ENCOUNTER — Other Ambulatory Visit: Payer: Self-pay | Admitting: Oncology

## 2018-01-24 DIAGNOSIS — C259 Malignant neoplasm of pancreas, unspecified: Secondary | ICD-10-CM

## 2018-01-24 NOTE — Telephone Encounter (Signed)
...   Ref Range & Units 6d ago 3wk ago 52moago  WBC 4.0 - 10.5 K/uL 7.2  7.2 R 6.1 R  RBC 4.22 - 5.81 MIL/uL 5.03  5.06 R 4.81 R  Hemoglobin 13.0 - 17.0 g/dL 15.3  16.5 R 15.8 R  HCT 39.0 - 52.0 % 44.0  47.4 R 46.0 R  MCV 80.0 - 100.0 fL 87.5  93.7  95.7   MCH 26.0 - 34.0 pg 30.4  32.6  32.8   MCHC 30.0 - 36.0 g/dL 34.8  34.8 R 34.3 R  RDW 11.5 - 15.5 % 15.2  14.2 R 13.9 R  Platelets 150 - 400 K/uL 245  245 R 223 R  nRBC 0.0 - 0.2 % 0.0     Neutrophils Relative % % 46  51  46   Neutro Abs 1.7 - 7.7 K/uL 3.4  3.7 R 2.8 R  Lymphocytes Relative % _0 Lymphs Abs 0.7 - 4.0 K/uL 1.9  1.8 R 1.7 R  Monocytes Relative % _1 Monocytes Absolute 0.1 - 1.0 K/uL 1.2High   1.1High  R 0.8 R  Eosinophils Relative % _2 Eosinophils Absolute 0.0 - 0.5 K/uL 0.8High   0.5 R 0.7 R  Basophils Relative % _3 Basophils Absolute 0.0 - 0.1 K/uL 0.1  0.1 R, CM 0.1 R, CM  Immature Granulocytes % 0     Abs Immature Granulocytes 0.00 - 0.07 K/uL 0.02     Comment: Performed at [facility] [address], [place] [id number] Resulting Agency  [facility]      Specimen Collected: 01/18/18 08:49 Last Resulted: 01/18/18 08:59     Lab Flowsheet    Order Details    View Encounter    Lab and Collection Details    Routing    Result History      CM=Additional commentsR=Reference range differs from displayed range      Other Results from 01/18/2018   Magnesium  Order: [id number]  Status:  Final result Visible to patient:  No (Not Released) Next appt:  02/08/2018 at 09:00 AM in Oncology ([facility]) Dx:  Malignant neoplasm of pancreas, unspe...   Ref Range & Units 6d ago 3wk ago 157mogo  Magnesium 1.7 - 2.4 mg/dL 1.8  1.7 CM 1.7 CM  Comment: Performed at [facility] [place] [id number] Agency  [facility]      Specimen Collected: 01/18/18 08:49 Last  Resulted: 01/18/18 09:08     Lab Flowsheet    Order Details    View Encounter    Lab and Collection Details    Routing    Result History      CM=Additional comments        Contains abnormal data Cancer antigen 19-9  Order: [id number] Status:  Final result Visible to patient:  No (Not Released) Next appt:  02/08/2018 at 09:00 AM in Oncology ([facility]) Dx:  Malignant neoplasm of pancreas, unspe...   Ref Range & Units 6d ago 3wk ago 10m20moo  CA 19-9 0 - 35 U/mL 462High   443High  CM 377High  CM  Comment: (NOTE)  Roche Diagnostics Electrochemiluminescence Immunoassay (ECLIA)  Values obtained with different assay methods or kits cannot  be  used interchangeably. Results cannot be interpreted as absolute  evidence of the presence or absence of malignant disease.  Performed At: [facility]  [address] [place], [place] [id number]  Rush Farmer MD KZ:[id number]   Resulting Agency  [facility] CLIN LAB [facility] CLIN LAB [facility] CLIN LAB      Specimen Collected: 01/18/18 08:49 Last Resulted: 01/19/18 05:37     Lab Flowsheet    Order Details    View Encounter    Lab and Collection Details    Routing    Result History      CM=Additional comments        Contains abnormal data Comprehensive metabolic panel  Order: [id number]   Status:  Final result Visible to patient:  No (Not Released) Next appt:  02/08/2018 at 09:00 AM in Oncology ([facility]) Dx:  Malignant neoplasm of pancreas, unspe...   Ref Range & Units 6d ago 3wk ago 64moago  Sodium 135 - 145 mmol/L 132Low   131Low   131Low    Potassium 3.5 - 5.1 mmol/L 3.7  4.1  3.9   Chloride 98 - 111 mmol/L 101  95Low   95Low    CO2 22 - 32 mmol/L _0 Glucose, Bld 70 - 99 mg/dL 116High   115High   101High    BUN 6 - 20 mg/dL 5Low   7  6   Creatinine, Ser 0.61 - 1.24 mg/dL 0.35Low   0.40Low   0.30Low    Calcium 8.9 - 10.3 mg/dL 9.6  9.5  9.1   Total Protein 6.5 - 8.1 g/dL 7.5  7.4  7.2   Albumin 3.5 -  5.0 g/dL 3.8  3.7  3.7   AST 15 - 41 U/L 137High   151High   142High    ALT 0 - 44 U/L 151High   133High   140High    Alkaline Phosphatase 38 - 126 U/L 875High   1,146High   901High    Total Bilirubin 0.3 - 1.2 mg/dL 1.2  0.7  0.7   GFR calc non Af Amer >60 mL/min >60  >60  >60   GFR calc Af Amer >60 mL/min >60  >60 CM >60 CM  Comment: (NOTE)  The eGFR has been calculated using the CKD EPI equation.  This calculation has not been validated in all clinical situations.  eGFR's persistently <60 mL/min signify possible Chronic Kidney  Disease.   Anion gap 5 - _1 CM 10 CM  Comment: Performed at [facility] [address], [place] [id number] Resulting Agency  [facility]CLIN LAB C[facility]CL[facility] LAB CB[facility]CLIN LAB      Specimen Collected: 01/18/18 08:49 Last Resulted: 01/18/18 09:08

## 2018-01-30 MED FILL — XELODA 500 MG TABLET: 500 | 21 days supply | Qty: 84 | Fill #0

## 2018-02-05 NOTE — Progress Notes (Deleted)
Benjamin Diaz  Telephone:([phone] [phone]  ID: Benjamin Diaz. OB: [birth year]  MR#: [id number]  ERX#:[id number]  Patient Care Team: Patient, [name] as PCP - General (General Practice) [name], MD as Consulting Physician (Oncology) [name], MD (Inactive) as Consulting Physician (Gastroenterology) [name], MD as Consulting Physician (Gastroenterology)  CHIEF COMPLAINT: Stage IV pancreatic cancer  INTERVAL HISTORY: Patient returns to clinic today for further evaluation and consideration of cycle 8 of CAPOX.  He continues to tolerate his treatments well without significant side effects.  He has chronic weakness and fatigue that is unchanged. He has a poor appetite, but no weight loss. He has no neurologic complaints. He denies any recent fevers or illnesses.  He denies any chest pain, shortness of breath, cough, or hemoptysis.  He denies any nausea, vomiting, constipation, or diarrhea. He has no urinary complaints.  Patient offers no further specific complaints today.  REVIEW OF SYSTEMS:   Review of Systems  Constitutional: Positive for malaise/fatigue. Negative for fever and weight loss.  Respiratory: Negative.  Negative for cough and shortness of breath.   Cardiovascular: Negative.  Negative for chest pain and leg swelling.  Gastrointestinal: Negative.  Negative for abdominal pain, diarrhea, nausea and vomiting.  Genitourinary: Negative.  Negative for dysuria, frequency and urgency.  Musculoskeletal: Negative.  Negative for joint pain.  Skin: Negative.  Negative for itching and rash.  Neurological: Positive for weakness. Negative for sensory change and focal weakness.  Psychiatric/Behavioral: Negative.  The patient is not nervous/anxious.     As per HPI. Otherwise, a complete review of systems is negative.  PAST MEDICAL HISTORY: Past Medical History:  Diagnosis Date  . Arthritis   . Asthma   . Atrial fibrillation ([place])     . Closed left ankle fracture age 73  . COPD (chronic obstructive pulmonary disease) ([place])   . Dysrhythmia   . Pancreatic cancer ([place]) 06/2016   Chemo tx's.   . Pneumonia 2011  . Right shoulder injury 09/27/2016  . Sciatica    left leg pinched nerve numb at times about once a year    PAST SURGICAL HISTORY: Past Surgical History:  Procedure Laterality Date  . BILIARY STENT PLACEMENT N/A 09/01/2016   Procedure: BILIARY STENT PLACEMENT;  Surgeon: [name], MD;  Location: [facility];  Service: Endoscopy;  Laterality: N/A;  . DIAGNOSTIC LAPAROSCOPIC LIVER BIOPSY  09/16/2016   Procedure: DIAGNOSTIC LAPAROSCOPIC LIVER BIOPSY;  Surgeon: [name], MD;  Location: [facility];  Service: General;;  . ERCP N/A 07/09/2016   Procedure: ENDOSCOPIC RETROGRADE CHOLANGIOPANCREATOGRAPHY (ERCP);  Surgeon: [name], MD;  Location: [facility];  Service: Endoscopy;  Laterality: N/A;  . ERCP N/A 09/01/2016   Procedure: ENDOSCOPIC RETROGRADE CHOLANGIOPANCREATOGRAPHY (ERCP);  Surgeon: [name], MD;  Location: [facility];  Service: Endoscopy;  Laterality: N/A;  . ERCP N/A 06/28/2017   Procedure: ENDOSCOPIC RETROGRADE CHOLANGIOPANCREATOGRAPHY (ERCP);  Surgeon: [name], MD;  Location: [facility]COPY;  Service: Endoscopy;  Laterality: N/A;  . ESOPHAGOGASTRODUODENOSCOPY (EGD) WITH PROPOFOL N/A 09/01/2016   Procedure: ESOPHAGOGASTRODUODENOSCOPY (EGD) WITH PROPOFOL;  Surgeon: [name], MD;  [facility];  Service: Gastroenterology;  Laterality: N/A;  . EUS N/A 09/01/2016   Procedure: ESOPHAGEAL ENDOSCOPIC ULTRASOUND (EUS) RADIAL;  Surgeon: [name], MD;  [facility];  Service: Endoscopy;  Laterality: N/A;  . FINE NEEDLE ASPIRATION N/A 09/01/2016   Procedure: FINE NEEDLE ASPIRATION (FNA) RADIAL;  Surgeon: [name], MD;  [facility];  Service: Endoscopy;  Laterality:  N/A;  . FOREIGN BODY REMOVAL N/A 09/01/2016   Procedure: FOREIGN BODY REMOVAL;  Surgeon: [name], MD;  [facility];  Service: Gastroenterology;  Laterality: N/A;  . KNEE SURGERY Right 2002   arthroscopy  . PORTA CATH INSERTION N/A 09/20/2017   Procedure: PORTA CATH INSERTION;  Surgeon: [name], MD;  Location: [facility];  Service: Cardiovascular;  Laterality: N/A;  . SHOULDER SURGERY Right 2002    FAMILY HISTORY: Family History  Problem Relation Age of Onset  . Heart disease Father     ADVANCED DIRECTIVES (Y/N):  N  HEALTH MAINTENANCE: Social History   Tobacco Use  . Smoking status: Current Every Day Smoker    Packs/day: 1.00    Years: 40.00    Pack years: 40.00    Types: Cigarettes  . Smokeless tobacco: Never Used  Substance Use Topics  . Alcohol use: Yes    Alcohol/week: 7.0 standard drinks    Types: 7 Cans of beer per week    Comment: last drink 5 weeeks ago  april 2018  . Drug use: No     Colonoscopy:  PAP:  Bone density:  Lipid panel:  Allergies  Allergen Reactions  . No Known Allergies     Current Outpatient Medications  Medication Sig Dispense Refill  . albuterol (PROVENTIL HFA;VENTOLIN HFA) 108 (90 Base) MCG/ACT inhaler Inhale 1-2 puffs into the lungs every 6 (six) hours as needed for wheezing or shortness of breath. 1 Inhaler 6  . Ipratropium-Albuterol (COMBIVENT RESPIMAT) 20-100 MCG/ACT AERS respimat Inhale 1 puff into the lungs 4 (four) times daily. 1 Inhaler 6  . megestrol (MEGACE) 40 MG tablet Take 1 tablet (40 mg total) by mouth daily. (Patient not taking: Reported on 12/28/2017) 30 tablet 2  . XELODA 500 MG tablet TAKE 3 TABLETS (1,500 MG TOTAL) BY MOUTH 2 (TWO) TIMES DAILY AFTER A MEAL. TAKE FOR 14 DAYS ON, THEN 7 DAYS OFF. 84 tablet 2   No current facility-administered medications for this visit.    OBJECTIVE: There were no vitals filed for this visit.   There is no height or weight on file to calculate BMI.    ECOG FS:1 - Symptomatic but completely ambulatory  General: Thin, no acute distress. Eyes:  Pink conjunctiva, anicteric sclera. HEENT: Normocephalic, moist mucous membranes, clear oropharnyx. Lungs: Clear to auscultation bilaterally. Heart: Regular rate and rhythm. No rubs, murmurs, or gallops. Abdomen: Soft, nontender, nondistended. No organomegaly noted, normoactive bowel sounds. Musculoskeletal: No edema, cyanosis, or clubbing. Neuro: Alert, answering all questions appropriately. Cranial nerves grossly intact. Skin: No rashes or petechiae noted. Psych: Normal affect.  LAB RESULTS:  Lab Results  Component Value Date   NA 132 (L) 01/18/2018   K 3.7 01/18/2018   CL 101 01/18/2018   CO2 24 01/18/2018   GLUCOSE 116 (H) 01/18/2018   BUN 5 (L) 01/18/2018   CREATININE 0.35 (L) 01/18/2018   CALCIUM 9.6 01/18/2018   PROT 7.5 01/18/2018   ALBUMIN 3.8 01/18/2018   AST 137 (H) 01/18/2018   ALT 151 (H) 01/18/2018   ALKPHOS 875 (H) 01/18/2018   BILITOT 1.2 01/18/2018   GFRNONAA >60 01/18/2018   GFRAA >60 01/18/2018    Lab Results  Component Value Date   WBC 7.2 01/18/2018   NEUTROABS 3.4 01/18/2018   HGB 15.3 01/18/2018   HCT 44.0 01/18/2018   MCV 87.5 01/18/2018   PLT 245 01/18/2018      STUDIES: No results found.  ASSESSMENT: Stage IV pancreatic  cancer  PLAN:   1. Stage IV pancreatic cancer: Imaging, pathology, and Op note reviewed independently confirming stage IV disease with multiple peritoneal implants.  CT scan results from December 02, 2017 reviewed independently and report as above with mild improvement in pancreatic head tumor.  No new or progressive metastatic disease noted.  Despite the improved imaging, patient's CA 19-9 continues to slowly trend up and is now 443.  We will proceed with cycle 8 of CAPEOX today.  He will receive oxaliplatin today and 14 days of oral capecitabine.  Patient will then have 7 days off.  Can always reconsider modified FOLFIRINOX (oxaliplatin 85 mg/m, leucovorin 400 mg/m, irinotecan 150 mg/m, 5-FU 2400 mg/m over 46 hours)  every 2 weeks in the future.  Return to clinic in 3 weeks for laboratory work and oxaliplatin only.  Patient will then return to clinic in 6 weeks for further evaluation and consideration of cycle 10.  Plan to reimage in December 2019, but of patient's CA 19-9 continues to increase may reimage sooner. 2.  Elevated liver enzymes: AST and ALT remain persistently elevated.  Monitor and proceed with treatment as above. Patient has not been evaluated by GI secondary to transportation issues. 3. Hyponatremia: Chronic.  Sodium unchanged at 131 today. 4. Peripheral edema: Continue elevation nightly.  Lower extremity ultrasound did not reveal DVT.   5.  Elevated bilirubin: Resolved. 6.  Pain: Resolved.  Patient no longer is taking narcotics. 7.  Poor appetite: Patient has Megace prescribed, but is unclear his compliance.   8.  Shortness of breath: Chronic and unchanged.  Patient does not complain of this today.  Continue inhalers as prescribed.  Patient expressed understanding and was in agreement with this plan. He also understands that He can call clinic at any time with any questions, concerns, or complaints.   Cancer Staging Pancreatic cancer [facility]) Staging form: Exocrine Pancreas, AJCC 8th Edition - Clinical stage from 09/25/2016: Stage IV (cTX, cNX, pM1) - Signed by Lloyd Huger, MD on 09/25/2016   Lloyd Huger, MD   02/05/2018 12:49 PM

## 2018-02-08 ENCOUNTER — Inpatient Hospital Stay: Payer: Medicaid Other

## 2018-02-08 ENCOUNTER — Inpatient Hospital Stay: Payer: Medicaid Other | Admitting: Oncology

## 2018-02-09 ENCOUNTER — Inpatient Hospital Stay: Payer: Medicaid Other

## 2018-02-09 ENCOUNTER — Inpatient Hospital Stay: Payer: Medicaid Other | Admitting: Oncology

## 2018-02-11 NOTE — Progress Notes (Signed)
Benjamin Diaz  Telephone:([phone] [phone]  ID: Benjamin Diaz. OB: [birth year]  MR#: [id number]  ZWC#:[id number]  Patient Care Team: Patient, [name] as PCP - General (General Practice) [name], MD as Consulting Physician (Oncology) [name], MD (Inactive) as Consulting Physician (Gastroenterology) [name], MD as Consulting Physician (Gastroenterology)  CHIEF COMPLAINT: Stage IV pancreatic cancer  INTERVAL HISTORY: Patient returns to clinic today for further evaluation and consideration of cycle 10 of CAPOX.  Recent treatments have been delayed secondary to transportation difficulties.  He continues to tolerate his treatments well without significant side effects.  He has chronic weakness and fatigue that is unchanged.  He has right lower back/flank pain.  He has a poor appetite, but no weight loss. He has no neurologic complaints. He denies any recent fevers or illnesses.  He denies any chest pain, shortness of breath, cough, or hemoptysis.  He denies any nausea, vomiting, constipation, or diarrhea. He has no urinary complaints.  Patient offers no further specific complaints today.  REVIEW OF SYSTEMS:   Review of Systems  Constitutional: Positive for malaise/fatigue. Negative for fever and weight loss.  Respiratory: Negative.  Negative for cough and shortness of breath.   Cardiovascular: Negative.  Negative for chest pain and leg swelling.  Gastrointestinal: Negative.  Negative for abdominal pain, diarrhea, nausea and vomiting.  Genitourinary: Positive for flank pain. Negative for dysuria, frequency and urgency.  Musculoskeletal: Positive for back pain. Negative for joint pain.  Skin: Negative.  Negative for itching and rash.  Neurological: Positive for weakness. Negative for sensory change and focal weakness.  Psychiatric/Behavioral: Negative.  The patient is not nervous/anxious.     As per HPI. Otherwise, a complete review of  systems is negative.  PAST MEDICAL HISTORY: Past Medical History:  Diagnosis Date  . Arthritis   . Asthma   . Atrial fibrillation ([place])   . Closed left ankle fracture age 71  . COPD (chronic obstructive pulmonary disease) ([place])   . Dysrhythmia   . Pancreatic cancer ([place]) 06/2016   Chemo tx's.   . Pneumonia 2011  . Right shoulder injury 09/27/2016  . Sciatica    left leg pinched nerve numb at times about once a year    PAST SURGICAL HISTORY: Past Surgical History:  Procedure Laterality Date  . BILIARY STENT PLACEMENT N/A 09/01/2016   Procedure: BILIARY STENT PLACEMENT;  Surgeon: [name], MD;  [facility];  Service: Endoscopy;  Laterality: N/A;  . DIAGNOSTIC LAPAROSCOPIC LIVER BIOPSY  09/16/2016   Procedure: DIAGNOSTIC LAPAROSCOPIC LIVER BIOPSY;  Surgeon: [name], MD;  Location: [facility];  Service: General;;  . ERCP N/A 07/09/2016   Procedure: ENDOSCOPIC RETROGRADE CHOLANGIOPANCREATOGRAPHY (ERCP);  Surgeon: [name], MD;  Location: [facility];  Service: Endoscopy;  Laterality: N/A;  . ERCP N/A 09/01/2016   Procedure: ENDOSCOPIC RETROGRADE CHOLANGIOPANCREATOGRAPHY (ERCP);  Surgeon: [name], MD;  Location: [facility];  Service: Endoscopy;  Laterality: N/A;  . ERCP N/A 06/28/2017   Procedure: ENDOSCOPIC RETROGRADE CHOLANGIOPANCREATOGRAPHY (ERCP);  Surgeon: [name], MD;  Location: [facility] END[facility]COPY;  Service: Endoscopy;  Laterality: N/A;  . ESOPHAGOGASTRODUODENOSCOPY (EGD) WITH PROPOFOL N/A 09/01/2016   Procedure: ESOPHAGOGASTRODUODENOSCOPY (EGD) WITH PROPOFOL;  Surgeon: [name], MD;  [facility];  Service: Gastroenterology;  Laterality: N/A;  . EUS N/A 09/01/2016   Procedure: ESOPHAGEAL ENDOSCOPIC ULTRASOUND (EUS) RADIAL;  Surgeon: [name], MD;  [facility];  Service: Endoscopy;  Laterality: N/A;  . FINE NEEDLE ASPIRATION N/A 09/01/2016  Procedure: FINE NEEDLE ASPIRATION (FNA) RADIAL;  Surgeon: [name], MD;  Location:  [facility];  Service: Endoscopy;  Laterality: N/A;  . FOREIGN BODY REMOVAL N/A 09/01/2016   Procedure: FOREIGN BODY REMOVAL;  Surgeon: [name], MD;  [facility];  Service: Gastroenterology;  Laterality: N/A;  . KNEE SURGERY Right 2002   arthroscopy  . PORTA CATH INSERTION N/A 09/20/2017   Procedure: PORTA CATH INSERTION;  Surgeon: [name], MD;  Location: [facility];  Service: Cardiovascular;  Laterality: N/A;  . SHOULDER SURGERY Right 2002    FAMILY HISTORY: Family History  Problem Relation Age of Onset  . Heart disease Father     ADVANCED DIRECTIVES (Y/N):  N  HEALTH MAINTENANCE: Social History   Tobacco Use  . Smoking status: Current Every Day Smoker    Packs/day: 1.00    Years: 40.00    Pack years: 40.00    Types: Cigarettes  . Smokeless tobacco: Never Used  Substance Use Topics  . Alcohol use: Yes    Alcohol/week: 7.0 standard drinks    Types: 7 Cans of beer per week    Comment: last drink 5 weeeks ago  april 2018  . Drug use: No     Colonoscopy:  PAP:  Bone density:  Lipid panel:  Allergies  Allergen Reactions  . No Known Allergies     Current Outpatient Medications  Medication Sig Dispense Refill  . acetaminophen (TYLENOL) 500 MG tablet Take 1,000 mg by mouth every 8 (eight) hours as needed.    Marland Kitchen albuterol (PROVENTIL HFA;VENTOLIN HFA) 108 (90 Base) MCG/ACT inhaler Inhale 1-2 puffs into the lungs every 6 (six) hours as needed for wheezing or shortness of breath. 1 Inhaler 6  . Ipratropium-Albuterol (COMBIVENT RESPIMAT) 20-100 MCG/ACT AERS respimat Inhale 1 puff into the lungs 4 (four) times daily. 1 Inhaler 6  . megestrol (MEGACE) 40 MG tablet Take 1 tablet (40 mg total) by mouth daily. 30 tablet 2  . XELODA 500 MG tablet TAKE 3 TABLETS (1,500 MG TOTAL) BY MOUTH 2 (TWO) TIMES DAILY AFTER A MEAL. TAKE FOR 14 DAYS ON, THEN 7 DAYS OFF. 84 tablet 2   No current facility-administered medications for this visit.     OBJECTIVE: Vitals:   02/16/18 0857  BP: (!) 136/96  Temp: (!) 97.1 F (36.2 C)     Body mass index is 15.87 kg/m.    ECOG FS:1 - Symptomatic but completely ambulatory  General: Thin, no acute distress. Eyes: Pink conjunctiva, anicteric sclera. HEENT: Normocephalic, moist mucous membranes, clear oropharnyx. Lungs: Clear to auscultation bilaterally. Heart: Regular rate and rhythm. No rubs, murmurs, or gallops. Abdomen: Soft, nontender, nondistended. No organomegaly noted, normoactive bowel sounds. Musculoskeletal: No edema, cyanosis, or clubbing. Neuro: Alert, answering all questions appropriately. Cranial nerves grossly intact. Skin: No rashes or petechiae noted. Psych: Normal affect.  LAB RESULTS:  Lab Results  Component Value Date   NA 133 (L) 02/16/2018   K 3.7 02/16/2018   CL 100 02/16/2018   CO2 25 02/16/2018   GLUCOSE 107 (H) 02/16/2018   BUN 8 02/16/2018   CREATININE <0.30 (L) 02/16/2018   CALCIUM 9.3 02/16/2018   PROT 7.5 01/18/2018   ALBUMIN 3.8 01/18/2018   AST 137 (H) 01/18/2018   ALT 151 (H) 01/18/2018   ALKPHOS 875 (H) 01/18/2018   BILITOT 1.2 01/18/2018   GFRNONAA NOT CALCULATED 02/16/2018   GFRAA NOT CALCULATED 02/16/2018    Lab Results  Component Value Date   WBC 7.2  02/16/2018   NEUTROABS 4.0 02/16/2018   HGB 15.1 02/16/2018   HCT 42.5 02/16/2018   MCV 89.1 02/16/2018   PLT 204 02/16/2018      STUDIES: No results found.  ASSESSMENT: Stage IV pancreatic cancer  PLAN:   1. Stage IV pancreatic cancer: Imaging, pathology, and Op note reviewed independently confirming stage IV disease with multiple peritoneal implants.  CT scan results from December 02, 2017 reviewed independently with mild improvement in pancreatic head tumor.  No new or progressive metastatic disease noted.  Despite the improved imaging, patient's CA 19-9 continues to slowly trend up and is now 495.  Proceed with cycle 10 of CAPEOX today.  He will receive oxaliplatin today  and 14 days of oral capecitabine.  Patient will then have 7 days off.  Can always reconsider modified FOLFIRINOX (oxaliplatin 85 mg/m, leucovorin 400 mg/m, irinotecan 150 mg/m, 5-FU 2400 mg/m over 46 hours) every 2 weeks in the future.  Return to clinic on March 16, 2018 for further evaluation and consideration of cycle 11.  Will reimage prior to cycle 12.   2.  Elevated liver enzymes: AST and ALT remain persistently elevated.  Monitor and proceed with treatment as above. Patient has not been evaluated by GI secondary to transportation issues. 3. Hyponatremia: Chronic and relatively unchanged.  Patient's sodium is 133 today. 4. Peripheral edema: Continue elevation nightly.  Lower extremity ultrasound did not reveal DVT.   5.  Elevated bilirubin: Resolved. 6.  Pain: Slightly worse today.  Patient is no longer taking narcotics and does not wish a new prescription.  Repeat imaging as above.   7.  Poor appetite: Patient has Megace prescribed, but is unclear his compliance.   8.  Shortness of breath: Chronic and unchanged.  Patient does not complain of this today.  Continue inhalers as prescribed.  Patient expressed understanding and was in agreement with this plan. He also understands that He can call clinic at any time with any questions, concerns, or complaints.   Cancer Staging Pancreatic cancer [facility]) Staging form: Exocrine Pancreas, AJCC 8th Edition - Clinical stage from 09/25/2016: Stage IV (cTX, cNX, pM1) - Signed by Lloyd Huger, MD on 09/25/2016   Lloyd Huger, MD   02/20/2018 1:58 PM

## 2018-02-16 ENCOUNTER — Inpatient Hospital Stay: Payer: Medicaid Other | Attending: Oncology

## 2018-02-16 ENCOUNTER — Inpatient Hospital Stay: Payer: Medicaid Other

## 2018-02-16 ENCOUNTER — Other Ambulatory Visit: Payer: Self-pay

## 2018-02-16 ENCOUNTER — Inpatient Hospital Stay (HOSPITAL_BASED_OUTPATIENT_CLINIC_OR_DEPARTMENT_OTHER): Payer: Medicaid Other | Admitting: Oncology

## 2018-02-16 VITALS — BP 136/96 | Temp 97.1°F | Wt 117.0 lb

## 2018-02-16 DIAGNOSIS — R0602 Shortness of breath: Secondary | ICD-10-CM

## 2018-02-16 DIAGNOSIS — Z5111 Encounter for antineoplastic chemotherapy: Secondary | ICD-10-CM | POA: Diagnosis present

## 2018-02-16 DIAGNOSIS — E871 Hypo-osmolality and hyponatremia: Secondary | ICD-10-CM | POA: Diagnosis not present

## 2018-02-16 DIAGNOSIS — C250 Malignant neoplasm of head of pancreas: Secondary | ICD-10-CM | POA: Diagnosis present

## 2018-02-16 DIAGNOSIS — R109 Unspecified abdominal pain: Secondary | ICD-10-CM

## 2018-02-16 DIAGNOSIS — C786 Secondary malignant neoplasm of retroperitoneum and peritoneum: Secondary | ICD-10-CM | POA: Diagnosis not present

## 2018-02-16 DIAGNOSIS — C259 Malignant neoplasm of pancreas, unspecified: Secondary | ICD-10-CM

## 2018-02-16 DIAGNOSIS — F17210 Nicotine dependence, cigarettes, uncomplicated: Secondary | ICD-10-CM

## 2018-02-16 DIAGNOSIS — R600 Localized edema: Secondary | ICD-10-CM

## 2018-02-16 DIAGNOSIS — M545 Low back pain: Secondary | ICD-10-CM

## 2018-02-16 DIAGNOSIS — R748 Abnormal levels of other serum enzymes: Secondary | ICD-10-CM

## 2018-02-16 DIAGNOSIS — R531 Weakness: Secondary | ICD-10-CM

## 2018-02-16 DIAGNOSIS — R630 Anorexia: Secondary | ICD-10-CM

## 2018-02-16 DIAGNOSIS — R5382 Chronic fatigue, unspecified: Secondary | ICD-10-CM

## 2018-02-16 LAB — CBC WITH DIFFERENTIAL/PLATELET
Abs Immature Granulocytes: 0.01 10*3/uL (ref 0.00–0.07)
BASOS PCT: 1 %
Basophils Absolute: 0.1 10*3/uL (ref 0.0–0.1)
EOS ABS: 0.6 10*3/uL — AB (ref 0.0–0.5)
EOS PCT: 8 %
HEMATOCRIT: 42.5 % (ref 39.0–52.0)
Hemoglobin: 15.1 g/dL (ref 13.0–17.0)
IMMATURE GRANULOCYTES: 0 %
LYMPHS ABS: 1.7 10*3/uL (ref 0.7–4.0)
Lymphocytes Relative: 23 %
MCH: 31.7 pg (ref 26.0–34.0)
MCHC: 35.5 g/dL (ref 30.0–36.0)
MCV: 89.1 fL (ref 80.0–100.0)
MONO ABS: 0.9 10*3/uL (ref 0.1–1.0)
MONOS PCT: 12 %
Neutro Abs: 4 10*3/uL (ref 1.7–7.7)
Neutrophils Relative %: 56 %
PLATELETS: 204 10*3/uL (ref 150–400)
RBC: 4.77 MIL/uL (ref 4.22–5.81)
RDW: 17.6 % — AB (ref 11.5–15.5)
WBC: 7.2 10*3/uL (ref 4.0–10.5)
nRBC: 0 % (ref 0.0–0.2)

## 2018-02-16 LAB — BASIC METABOLIC PANEL
Anion gap: 8 (ref 5–15)
BUN: 8 mg/dL (ref 6–20)
CALCIUM: 9.3 mg/dL (ref 8.9–10.3)
CO2: 25 mmol/L (ref 22–32)
Chloride: 100 mmol/L (ref 98–111)
GLUCOSE: 107 mg/dL — AB (ref 70–99)
Potassium: 3.7 mmol/L (ref 3.5–5.1)
Sodium: 133 mmol/L — ABNORMAL LOW (ref 135–145)

## 2018-02-16 LAB — MAGNESIUM: Magnesium: 1.7 mg/dL (ref 1.7–2.4)

## 2018-02-16 MED ORDER — PALONOSETRON HCL INJECTION 0.25 MG/5ML
0.2500 mg | Freq: Once | INTRAVENOUS | Status: AC
  Administered 2018-02-16: 0.25 mg via INTRAVENOUS
  Filled 2018-02-16: qty 5

## 2018-02-16 MED ORDER — DEXAMETHASONE SODIUM PHOSPHATE 10 MG/ML IJ SOLN
10.0000 mg | Freq: Once | INTRAMUSCULAR | Status: AC
  Administered 2018-02-16: 10 mg via INTRAVENOUS
  Filled 2018-02-16: qty 1

## 2018-02-16 MED ORDER — HEPARIN SOD (PORK) LOCK FLUSH 100 UNIT/ML IV SOLN
500.0000 [IU] | Freq: Once | INTRAVENOUS | Status: AC
  Administered 2018-02-16: 500 [IU] via INTRAVENOUS
  Filled 2018-02-16: qty 5

## 2018-02-16 MED ORDER — OXALIPLATIN CHEMO INJECTION 100 MG/20ML
150.0000 mg | Freq: Once | INTRAVENOUS | Status: AC
  Administered 2018-02-16: 150 mg via INTRAVENOUS
  Filled 2018-02-16: qty 30

## 2018-02-16 MED ORDER — DEXTROSE 5 % IV SOLN
Freq: Once | INTRAVENOUS | Status: AC
  Administered 2018-02-16: 10:00:00 via INTRAVENOUS
  Filled 2018-02-16: qty 250

## 2018-02-16 MED ORDER — SODIUM CHLORIDE 0.9% FLUSH
10.0000 mL | INTRAVENOUS | Status: DC | PRN
  Administered 2018-02-16: 10 mL via INTRAVENOUS
  Filled 2018-02-16: qty 10

## 2018-02-16 MED FILL — XELODA 500 MG TABLET: 500 | 21 days supply | Qty: 84 | Fill #1

## 2018-02-16 NOTE — Progress Notes (Signed)
Patient stated that he's had low back pain radiating to his right lower quadrant for weeks. Patient stated that he takes his pain medication (Acetaminophen) as needed but doesn't seem to help much. Patient stated that overall he is doing good. Patient denied fever, chills, constipation, diarrhea, nausea, vomiting.

## 2018-02-17 LAB — CANCER ANTIGEN 19-9: CAN 19-9: 495 U/mL — AB (ref 0–35)

## 2018-03-12 NOTE — Progress Notes (Signed)
[place]  [phone] [phone]  ID: [name]. OB: 1960-07-31  MR#: [id number]  AOZ#:[id number]  Patient Care Team: Patient, [name] as PCP - General (General Practice) [name], MD as Consulting Physician (Oncology) [name], MD (Inactive) as Consulting Physician (Gastroenterology) [name], MD as Consulting Physician (Gastroenterology)  CHIEF COMPLAINT: Stage IV pancreatic cancer  INTERVAL HISTORY: Patient returns to clinic today for further evaluation and consideration of cycle 11 of CAPOX.  He has noted worsening pain in his back and abdomen over the past several weeks.  He also has an increased tremor in his right hand which he relates to a chronic shoulder problem.  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  He has chronic weakness and fatigue that is unchanged. He has a poor appetite, but no weight loss. He has no other neurologic complaints. He denies any recent fevers or illnesses.  He denies any chest pain, shortness of breath, cough, or hemoptysis.  He denies any nausea, vomiting, constipation, or diarrhea. He has no urinary complaints.  Patient offers no further specific complaints today.  REVIEW OF SYSTEMS:   Review of Systems  Constitutional: Positive for malaise/fatigue. Negative for fever and weight loss.  Respiratory: Negative.  Negative for cough and shortness of breath.   Cardiovascular: Negative.  Negative for chest pain and leg swelling.  Gastrointestinal: Positive for abdominal pain. Negative for diarrhea, nausea and vomiting.  Genitourinary: Positive for flank pain. Negative for dysuria, frequency and urgency.  Musculoskeletal: Positive for back pain and joint pain.  Skin: Negative.  Negative for itching and rash.  Neurological: Positive for tingling, tremors, sensory change and weakness. Negative for focal weakness.  Psychiatric/Behavioral: Negative.  The patient is not  nervous/anxious.     As per HPI. Otherwise, a complete review of systems is negative.  PAST MEDICAL HISTORY: Past Medical History:  Diagnosis Date  . Arthritis   . Asthma   . Atrial fibrillation ([place])   . Closed left ankle fracture age 48  . COPD (chronic obstructive pulmonary disease) ([place])   . Dysrhythmia   . Pancreatic cancer ([place]) 06/2016   Chemo tx's.   . Pneumonia 2011  . Right shoulder injury 09/27/2016  . Sciatica    left leg pinched nerve numb at times about once a year    PAST SURGICAL HISTORY: Past Surgical History:  Procedure Laterality Date  . BILIARY STENT PLACEMENT N/A 09/01/2016   Procedure: BILIARY STENT PLACEMENT;  Surgeon: [name], MD;  Location: [facility];  Service: Endoscopy;  Laterality: N/A;  . DIAGNOSTIC LAPAROSCOPIC LIVER BIOPSY  09/16/2016   Procedure: DIAGNOSTIC LAPAROSCOPIC LIVER BIOPSY;  Surgeon: [name], MD;  Location: [facility];  Service: General;;  . ERCP N/A 07/09/2016   Procedure: ENDOSCOPIC RETROGRADE CHOLANGIOPANCREATOGRAPHY (ERCP);  Surgeon: [name], MD;  Location: [facility];  Service: Endoscopy;  Laterality: N/A;  . ERCP N/A 09/01/2016   Procedure: ENDOSCOPIC RETROGRADE CHOLANGIOPANCREATOGRAPHY (ERCP);  Surgeon: [name], MD;  Location: [facility];  Service: Endoscopy;  Laterality: N/A;  . ERCP N/A 06/28/2017   Procedure: ENDOSCOPIC RETROGRADE CHOLANGIOPANCREATOGRAPHY (ERCP);  Surgeon: [name], MD;  Location[facility];  Service: Endoscopy;  Laterality: N/A;  . ESOPHAGOGASTRODUODENOSCOPY (EGD) WITH PROPOFOL N/A 09/01/2016   Procedure: ESOPHAGOGASTRODUODENOSCOPY (EGD) WITH PROPOFOL;  Surgeon: [name], MD;  Location: [facility];  Service: Gastroenterology;  Laterality: N/A;  . EUS N/A 09/01/2016   Procedure: ESOPHAGEAL ENDOSCOPIC ULTRASOUND (EUS) RADIAL;  Surgeon: [name], MD;  Location: [facility];  Service: Endoscopy;  Laterality: N/A;  . FINE NEEDLE ASPIRATION N/A 09/01/2016   Procedure: FINE NEEDLE  ASPIRATION (FNA) RADIAL;  Surgeon: [name], MD;  Location: [facility];  Service: Endoscopy;  Laterality: N/A;  . FOREIGN BODY REMOVAL N/A 09/01/2016   Procedure: FOREIGN BODY REMOVAL;  Surgeon: [name], MD;  Location: [facility];  Service: Gastroenterology;  Laterality: N/A;  . KNEE SURGERY Right 2002   arthroscopy  . PORTA CATH INSERTION N/A 09/20/2017   Procedure: PORTA CATH INSERTION;  Surgeon: [name], MD;  Location: [facility];  Service: Cardiovascular;  Laterality: N/A;  . SHOULDER SURGERY Right 2002    FAMILY HISTORY: Family History  Problem Relation Age of Onset  . Heart disease Father     ADVANCED DIRECTIVES (Y/N):  N  HEALTH MAINTENANCE: Social History   Tobacco Use  . Smoking status: Current Every Day Smoker    Packs/day: 1.00    Years: 40.00    Pack years: 40.00    Types: Cigarettes  . Smokeless tobacco: Never Used  Substance Use Topics  . Alcohol use: Yes    Alcohol/week: 7.0 standard drinks    Types: 7 Cans of beer per week    Comment: last drink 5 weeeks ago  april 2018  . Drug use: No     Colonoscopy:  PAP:  Bone density:  Lipid panel:  Allergies  Allergen Reactions  . No Known Allergies     Current Outpatient Medications  Medication Sig Dispense Refill  . acetaminophen (TYLENOL) 500 MG tablet Take 1,000 mg by mouth every 8 (eight) hours as needed.    Marland Kitchen albuterol (PROVENTIL HFA;VENTOLIN HFA) 108 (90 Base) MCG/ACT inhaler Inhale 1-2 puffs into the lungs every 6 (six) hours as needed for wheezing or shortness of breath. 1 Inhaler 6  . Ipratropium-Albuterol (COMBIVENT RESPIMAT) 20-100 MCG/ACT AERS respimat Inhale 1 puff into the lungs 4 (four) times daily. 1 Inhaler 6  . megestrol (MEGACE) 40 MG tablet Take 1 tablet (40 mg total) by mouth daily. 30 tablet 2  . XELODA 500 MG tablet TAKE 3 TABLETS (1,500 MG TOTAL) BY MOUTH 2 (TWO) TIMES DAILY AFTER A MEAL. TAKE FOR 14 DAYS ON, THEN 7 DAYS OFF. 84 tablet 2  . Oxycodone  HCl 10 MG TABS Take 1 tablet (10 mg total) by mouth 2 (two) times daily as needed. 60 tablet 0   No current facility-administered medications for this visit.    OBJECTIVE: Vitals:   03/16/18 0913  BP: 135/82  Pulse: 84  Resp: 18  Temp: (!) 97.4 F (36.3 C)     Body mass index is 15.75 kg/m.    ECOG FS:1 - Symptomatic but completely ambulatory  General: Thin, no acute distress. Eyes: Pink conjunctiva, anicteric sclera. HEENT: Normocephalic, moist mucous membranes, clear oropharnyx. Lungs: Clear to auscultation bilaterally. Heart: Regular rate and rhythm. No rubs, murmurs, or gallops. Abdomen: Soft, nontender, nondistended. No organomegaly noted, normoactive bowel sounds. Musculoskeletal: No edema, cyanosis, or clubbing. Neuro: Alert, answering all questions appropriately.  Intention tremor noted in right hand.  Strength intact. Skin: No rashes or petechiae noted. Psych: Normal affect. Lymphatics: No cervical, calvicular, axillary or inguinal LAD.  LAB RESULTS:  Lab Results  Component Value Date   NA 129 (L) 03/16/2018   K 3.9 03/16/2018   CL 95 (L) 03/16/2018   CO2 23 03/16/2018   GLUCOSE 115 (H) 03/16/2018   BUN 8 03/16/2018   CREATININE <0.30 (L) 03/16/2018  CALCIUM 9.4 03/16/2018   PROT 7.1 03/16/2018   ALBUMIN 3.9 03/16/2018   AST 159 (H) 03/16/2018   ALT 159 (H) 03/16/2018   ALKPHOS 998 (H) 03/16/2018   BILITOT 1.4 (H) 03/16/2018   GFRNONAA NOT CALCULATED 03/16/2018   GFRAA NOT CALCULATED 03/16/2018    Lab Results  Component Value Date   WBC 6.5 03/16/2018   NEUTROABS 3.6 03/16/2018   HGB 14.8 03/16/2018   HCT 42.0 03/16/2018   MCV 92.1 03/16/2018   PLT 226 03/16/2018      STUDIES: No results found.  ASSESSMENT: Stage IV pancreatic cancer  PLAN:   1. Stage IV pancreatic cancer: Imaging, pathology, and Op note reviewed independently confirming stage IV disease with multiple peritoneal implants.  CT scan results from December 02, 2017 reviewed  independently with mild improvement in pancreatic head tumor.  No new or progressive metastatic disease noted.  Despite the improved imaging, patient's CA 19-9 continues to slowly trend up and is now 545.  Proceed with cycle 11 of CAPEOX today.  He will receive oxaliplatin today and 14 days of oral capecitabine.  Patient will then have 7 days off.  Can always reconsider modified FOLFIRINOX (oxaliplatin 85 mg/m, leucovorin 400 mg/m, irinotecan 150 mg/m, 5-FU 2400 mg/m over 46 hours) every 2 weeks in the future.  Return to clinic in 3 weeks for further evaluation and consideration of cycle 12.  Plan to reimage 1 to 2 days prior to next clinic appointment. 2.  Elevated liver enzymes: AST and ALT remain persistently elevated.  Monitor and proceed with treatment as above. Patient has not been evaluated by GI secondary to transportation issues. 3. Hyponatremia: Chronic and relatively unchanged.  Patient sodium is 129 today. 4. Peripheral edema: Continue elevation nightly.  Lower extremity ultrasound did not reveal DVT.   5.  Elevated bilirubin: Resolved. 6.  Pain: Slightly worse today.  Patient was given a prescription for oxycodone today.   7.  Poor appetite: Patient has Megace prescribed, but is unclear his compliance.   8.  Shortness of breath: Chronic and unchanged.  Patient does not complain of this today.  Continue inhalers as prescribed. 9.  Shoulder pain/tremor: Patient has requested a referral to orthopedics as he is received injections in the past that improved his symptoms.  Patient expressed understanding and was in agreement with this plan. He also understands that He can call clinic at any time with any questions, concerns, or complaints.   Cancer Staging Pancreatic cancer [facility]) Staging form: Exocrine Pancreas, AJCC 8th Edition - Clinical stage from 09/25/2016: Stage IV (cTX, cNX, pM1) - Signed by Lloyd Huger, MD on 09/25/2016   Lloyd Huger, MD   03/17/2018 3:34  PM

## 2018-03-16 ENCOUNTER — Inpatient Hospital Stay: Payer: Medicaid Other

## 2018-03-16 ENCOUNTER — Other Ambulatory Visit: Payer: Self-pay | Admitting: *Deleted

## 2018-03-16 ENCOUNTER — Inpatient Hospital Stay: Payer: Medicaid Other | Attending: Oncology

## 2018-03-16 ENCOUNTER — Inpatient Hospital Stay (HOSPITAL_BASED_OUTPATIENT_CLINIC_OR_DEPARTMENT_OTHER): Payer: Medicaid Other | Admitting: Oncology

## 2018-03-16 ENCOUNTER — Encounter: Payer: Self-pay | Admitting: Oncology

## 2018-03-16 VITALS — BP 135/82 | HR 84 | Temp 97.4°F | Resp 18 | Wt 116.1 lb

## 2018-03-16 DIAGNOSIS — E871 Hypo-osmolality and hyponatremia: Secondary | ICD-10-CM

## 2018-03-16 DIAGNOSIS — C250 Malignant neoplasm of head of pancreas: Secondary | ICD-10-CM

## 2018-03-16 DIAGNOSIS — R600 Localized edema: Secondary | ICD-10-CM | POA: Insufficient documentation

## 2018-03-16 DIAGNOSIS — C259 Malignant neoplasm of pancreas, unspecified: Secondary | ICD-10-CM

## 2018-03-16 DIAGNOSIS — R251 Tremor, unspecified: Secondary | ICD-10-CM | POA: Diagnosis not present

## 2018-03-16 DIAGNOSIS — R0602 Shortness of breath: Secondary | ICD-10-CM

## 2018-03-16 DIAGNOSIS — Z79899 Other long term (current) drug therapy: Secondary | ICD-10-CM | POA: Diagnosis not present

## 2018-03-16 DIAGNOSIS — M25519 Pain in unspecified shoulder: Secondary | ICD-10-CM | POA: Diagnosis not present

## 2018-03-16 DIAGNOSIS — Z5111 Encounter for antineoplastic chemotherapy: Secondary | ICD-10-CM | POA: Insufficient documentation

## 2018-03-16 DIAGNOSIS — C786 Secondary malignant neoplasm of retroperitoneum and peritoneum: Secondary | ICD-10-CM

## 2018-03-16 DIAGNOSIS — Z7289 Other problems related to lifestyle: Secondary | ICD-10-CM | POA: Diagnosis not present

## 2018-03-16 DIAGNOSIS — R748 Abnormal levels of other serum enzymes: Secondary | ICD-10-CM

## 2018-03-16 DIAGNOSIS — F5089 Other specified eating disorder: Secondary | ICD-10-CM

## 2018-03-16 DIAGNOSIS — F17210 Nicotine dependence, cigarettes, uncomplicated: Secondary | ICD-10-CM | POA: Insufficient documentation

## 2018-03-16 LAB — CBC WITH DIFFERENTIAL/PLATELET
ABS IMMATURE GRANULOCYTES: 0.02 10*3/uL (ref 0.00–0.07)
BASOS ABS: 0.1 10*3/uL (ref 0.0–0.1)
Basophils Relative: 1 %
Eosinophils Absolute: 0.5 10*3/uL (ref 0.0–0.5)
Eosinophils Relative: 8 %
HEMATOCRIT: 42 % (ref 39.0–52.0)
Hemoglobin: 14.8 g/dL (ref 13.0–17.0)
IMMATURE GRANULOCYTES: 0 %
LYMPHS ABS: 1.4 10*3/uL (ref 0.7–4.0)
Lymphocytes Relative: 21 %
MCH: 32.5 pg (ref 26.0–34.0)
MCHC: 35.2 g/dL (ref 30.0–36.0)
MCV: 92.1 fL (ref 80.0–100.0)
MONOS PCT: 14 %
Monocytes Absolute: 0.9 10*3/uL (ref 0.1–1.0)
NEUTROS ABS: 3.6 10*3/uL (ref 1.7–7.7)
NEUTROS PCT: 56 %
NRBC: 0 % (ref 0.0–0.2)
Platelets: 226 10*3/uL (ref 150–400)
RBC: 4.56 MIL/uL (ref 4.22–5.81)
RDW: 19.2 % — ABNORMAL HIGH (ref 11.5–15.5)
WBC: 6.5 10*3/uL (ref 4.0–10.5)

## 2018-03-16 LAB — COMPREHENSIVE METABOLIC PANEL
ALT: 159 U/L — ABNORMAL HIGH (ref 0–44)
ANION GAP: 11 (ref 5–15)
AST: 159 U/L — ABNORMAL HIGH (ref 15–41)
Albumin: 3.9 g/dL (ref 3.5–5.0)
Alkaline Phosphatase: 998 U/L — ABNORMAL HIGH (ref 38–126)
BUN: 8 mg/dL (ref 6–20)
CO2: 23 mmol/L (ref 22–32)
Calcium: 9.4 mg/dL (ref 8.9–10.3)
Chloride: 95 mmol/L — ABNORMAL LOW (ref 98–111)
Creatinine, Ser: <0.3 mg/dL — ABNORMAL LOW (ref 0.61–1.24)
GLUCOSE: 115 mg/dL — AB (ref 70–99)
POTASSIUM: 3.9 mmol/L (ref 3.5–5.1)
SODIUM: 129 mmol/L — AB (ref 135–145)
Total Bilirubin: 1.4 mg/dL — ABNORMAL HIGH (ref 0.3–1.2)
Total Protein: 7.1 g/dL (ref 6.5–8.1)

## 2018-03-16 LAB — MAGNESIUM: MAGNESIUM: 1.7 mg/dL (ref 1.7–2.4)

## 2018-03-16 MED ORDER — HEPARIN SOD (PORK) LOCK FLUSH 100 UNIT/ML IV SOLN
500.0000 [IU] | Freq: Once | INTRAVENOUS | Status: AC | PRN
  Administered 2018-03-16: 500 [IU]
  Filled 2018-03-16: qty 5

## 2018-03-16 MED ORDER — OXALIPLATIN CHEMO INJECTION 100 MG/20ML
150.0000 mg | Freq: Once | INTRAVENOUS | Status: AC
  Administered 2018-03-16: 150 mg via INTRAVENOUS
  Filled 2018-03-16: qty 30

## 2018-03-16 MED ORDER — DEXTROSE 5 % IV SOLN
Freq: Once | INTRAVENOUS | Status: AC
  Administered 2018-03-16: 10:00:00 via INTRAVENOUS
  Filled 2018-03-16: qty 250

## 2018-03-16 MED ORDER — DEXAMETHASONE SODIUM PHOSPHATE 10 MG/ML IJ SOLN
10.0000 mg | Freq: Once | INTRAMUSCULAR | Status: AC
  Administered 2018-03-16: 10 mg via INTRAVENOUS
  Filled 2018-03-16: qty 1

## 2018-03-16 MED ORDER — PALONOSETRON HCL INJECTION 0.25 MG/5ML
0.2500 mg | Freq: Once | INTRAVENOUS | Status: AC
  Administered 2018-03-16: 0.25 mg via INTRAVENOUS
  Filled 2018-03-16: qty 5

## 2018-03-16 MED ORDER — OXYCODONE HCL 10 MG PO TABS: 10.0000 mg | ORAL_TABLET | Freq: Two times a day (BID) | ORAL | 0 refills | Status: DC | PRN

## 2018-03-16 MED ORDER — ALBUTEROL SULFATE HFA 108 (90 BASE) MCG/ACT IN AERS: 1.0000 | INHALATION_SPRAY | Freq: Four times a day (QID) | RESPIRATORY_TRACT | 6 refills | Status: DC | PRN

## 2018-03-16 NOTE — Progress Notes (Signed)
Patient here today for follow up and treatment consideration regarding pancreatic cancer. Patient reports worsening pain to back and abdomen, tylenol and ibuprofen not helping at this time. Patient also reports worsening tremor to right hand, requesting referral to orthopedics as he thinks its related to chronic shoulder issue. Patient also reports numbness and tingling to fingertips, states this improves the further out from treatment that he gets but is still present.

## 2018-03-17 LAB — CANCER ANTIGEN 19-9: CAN 19-9: 545 U/mL — AB (ref 0–35)

## 2018-03-17 MED FILL — XELODA 500 MG TABLET: 500 | 21 days supply | Qty: 84 | Fill #2

## 2018-03-31 ENCOUNTER — Other Ambulatory Visit: Payer: Self-pay | Admitting: Oncology

## 2018-03-31 NOTE — Progress Notes (Signed)
Benjamin Diaz  [phone] [phone]  ID: Benjamin Diaz Diaz. OB: 09-15-1960  MR#: [id number]  XVQ#:[id number]  Patient Care Team: Patient, [name] as PCP - General (General Practice) [name], MD as Consulting Physician (Oncology) [name], MD (Inactive) as Consulting Physician (Gastroenterology) [name], MD as Consulting Physician (Gastroenterology)  CHIEF COMPLAINT: Stage IV pancreatic cancer  INTERVAL HISTORY: Patient returns to clinic today for further evaluation, discussion of his imaging results, and consideration of cycle 12 of CAPOX.  Patient continues to have increased abdominal and back pain, but otherwise feels well.  He has chronic weakness and fatigue that is unchanged. He has a poor appetite, but no weight loss. He has no other neurologic complaints. He denies any recent fevers or illnesses.  He denies any chest pain, shortness of breath, cough, or hemoptysis.  He denies any nausea, vomiting, constipation, or diarrhea. He has no urinary complaints.  Patient offers no further specific complaints today.  REVIEW OF SYSTEMS:   Review of Systems  Constitutional: Positive for malaise/fatigue. Negative for fever and weight loss.  Respiratory: Negative.  Negative for cough and shortness of breath.   Cardiovascular: Negative.  Negative for chest pain and leg swelling.  Gastrointestinal: Positive for abdominal pain. Negative for diarrhea, nausea and vomiting.  Genitourinary: Positive for flank pain. Negative for dysuria, frequency and urgency.  Musculoskeletal: Positive for back pain and joint pain.  Skin: Negative.  Negative for itching and rash.  Neurological: Positive for tingling, tremors, sensory change and weakness. Negative for focal weakness.  Psychiatric/Behavioral: Negative.  The patient is not nervous/anxious.     As per HPI. Otherwise, a complete review of systems is negative.  PAST MEDICAL HISTORY: Past  Medical History:  Diagnosis Date  . Arthritis   . Asthma   . Atrial fibrillation ([place])   . Closed left ankle fracture age 21  . COPD (chronic obstructive pulmonary disease) ([place])   . Dysrhythmia   . Pancreatic cancer ([place]) 06/2016   Chemo tx's.   . Pneumonia 2011  . Right shoulder injury 09/27/2016  . Sciatica    left leg pinched nerve numb at times about once a year    PAST SURGICAL HISTORY: Past Surgical History:  Procedure Laterality Date  . BILIARY STENT PLACEMENT N/A 09/01/2016   Procedure: BILIARY STENT PLACEMENT;  Surgeon: [name], MD;  Location: [facility];  Service: Endoscopy;  Laterality: N/A;  . DIAGNOSTIC LAPAROSCOPIC LIVER BIOPSY  09/16/2016   Procedure: DIAGNOSTIC LAPAROSCOPIC LIVER BIOPSY;  Surgeon: [name], MD;  Location: [facility];  Service: General;;  . ERCP N/A 07/09/2016   Procedure: ENDOSCOPIC RETROGRADE CHOLANGIOPANCREATOGRAPHY (ERCP);  Surgeon: [name], MD;  Location: [facility];  Service: Endoscopy;  Laterality: N/A;  . ERCP N/A 09/01/2016   Procedure: ENDOSCOPIC RETROGRADE CHOLANGIOPANCREATOGRAPHY (ERCP);  Surgeon: [name], MD;  Location: [facility];  Service: Endoscopy;  Laterality: N/A;  . ERCP N/A 06/28/2017   Procedure: ENDOSCOPIC RETROGRADE CHOLANGIOPANCREATOGRAPHY (ERCP);  Surgeon: [name], MD;  Location: [facility];  Service: Endoscopy;  Laterality: N/A;  . ESOPHAGOGASTRODUODENOSCOPY (EGD) WITH PROPOFOL N/A 09/01/2016   Procedure: ESOPHAGOGASTRODUODENOSCOPY (EGD) WITH PROPOFOL;  Surgeon: [name], MD;  Location: [facility];  Service: Gastroenterology;  Laterality: N/A;  . EUS N/A 09/01/2016   Procedure: ESOPHAGEAL ENDOSCOPIC ULTRASOUND (EUS) RADIAL;  Surgeon: [name], MD;  Location: [facility];  Service: Endoscopy;  Laterality: N/A;  . FINE NEEDLE ASPIRATION N/A 09/01/2016   Procedure: FINE NEEDLE ASPIRATION (FNA) RADIAL;  Surgeon:  [name], MD;  Location: [facility];  Service: Endoscopy;  Laterality: N/A;    . FOREIGN BODY REMOVAL N/A 09/01/2016   Procedure: FOREIGN BODY REMOVAL;  Surgeon: [name], MD;  Location: [facility];  Service: Gastroenterology;  Laterality: N/A;  . KNEE SURGERY Right 2002   arthroscopy  . PORTA CATH INSERTION N/A 09/20/2017   Procedure: PORTA CATH INSERTION;  Surgeon: [name], MD;  Location: [facility];  Service: Cardiovascular;  Laterality: N/A;  . SHOULDER SURGERY Right 2002    FAMILY HISTORY: Family History  Problem Relation Age of Onset  . Heart disease Father     ADVANCED DIRECTIVES (Y/N):  N  HEALTH MAINTENANCE: Social History   Tobacco Use  . Smoking status: Current Every Day Smoker    Packs/day: 1.00    Years: 40.00    Pack years: 40.00    Types: Cigarettes  . Smokeless tobacco: Never Used  Substance Use Topics  . Alcohol use: Yes    Alcohol/week: 7.0 standard drinks    Types: 7 Cans of beer per week    Comment: last drink 5 weeeks ago  april 2018  . Drug use: No     Colonoscopy:  PAP:  Bone density:  Lipid panel:  Allergies  Allergen Reactions  . No Known Allergies     Current Outpatient Medications  Medication Sig Dispense Refill  . acetaminophen (TYLENOL) 500 MG tablet Take 1,000 mg by mouth every 8 (eight) hours as needed.    Marland Kitchen albuterol (PROVENTIL HFA;VENTOLIN HFA) 108 (90 Base) MCG/ACT inhaler Inhale 1-2 puffs into the lungs every 6 (six) hours as needed for wheezing or shortness of breath. 1 Inhaler 6  . Ipratropium-Albuterol (COMBIVENT RESPIMAT) 20-100 MCG/ACT AERS respimat Inhale 1 puff into the lungs 4 (four) times daily. 1 Inhaler 6  . megestrol (MEGACE) 40 MG tablet Take 1 tablet (40 mg total) by mouth daily. 30 tablet 2  . Oxycodone HCl 10 MG TABS Take 1 tablet (10 mg total) by mouth 2 (two) times daily as needed. 60 tablet 0  . XELODA 500 MG tablet TAKE 3 TABLETS (1,500 MG TOTAL) BY MOUTH 2 (TWO) TIMES DAILY AFTER A MEAL. TAKE FOR 14 DAYS ON, THEN 7 DAYS OFF. 84 tablet 2  . traMADol (ULTRAM) 50  MG tablet Take 1 tablet (50 mg total) by mouth every 6 (six) hours as needed. 30 tablet 0   No current facility-administered medications for this visit.    OBJECTIVE: Vitals:   04/06/18 0918  BP: 122/81  Pulse: 91  Temp: 97.6 F (36.4 C)     Body mass index is 15.73 kg/m.    ECOG FS:1 - Symptomatic but completely ambulatory  General: Thin, no acute distress. Eyes: Pink conjunctiva, anicteric sclera. HEENT: Normocephalic, moist mucous membranes, clear oropharnyx. Lungs: Clear to auscultation bilaterally. Heart: Regular rate and rhythm. No rubs, murmurs, or gallops. Abdomen: Soft, nontender, nondistended. No organomegaly noted, normoactive bowel sounds. Musculoskeletal: No edema, cyanosis, or clubbing. Neuro: Alert, answering all questions appropriately. Cranial nerves grossly intact. Skin: No rashes or petechiae noted. Psych: Normal affect.  LAB RESULTS:  Lab Results  Component Value Date   NA 132 (L) 04/06/2018   K 3.9 04/06/2018   CL 97 (L) 04/06/2018   CO2 26 04/06/2018   GLUCOSE 107 (H) 04/06/2018   BUN 8 04/06/2018   CREATININE 0.37 (L) 04/06/2018   CALCIUM 9.0 04/06/2018   PROT 7.1 04/06/2018   ALBUMIN 3.6 04/06/2018   AST 174 (H)  04/06/2018   ALT 166 (H) 04/06/2018   ALKPHOS 977 (H) 04/06/2018   BILITOT 1.5 (H) 04/06/2018   GFRNONAA >60 04/06/2018   GFRAA >60 04/06/2018    Lab Results  Component Value Date   WBC 6.8 04/06/2018   NEUTROABS 3.9 04/06/2018   HGB 14.8 04/06/2018   HCT 42.6 04/06/2018   MCV 95.5 04/06/2018   PLT 212 04/06/2018      STUDIES: Ct Chest W Contrast  Result Date: 04/03/2018 CLINICAL DATA:  58 year old male with history of pancreatic cancer originally diagnosed in July 2018, treated with chemotherapy. Follow-up study. EXAM: CT CHEST, ABDOMEN, AND PELVIS WITH CONTRAST TECHNIQUE: Multidetector CT imaging of the chest, abdomen and pelvis was performed following the standard protocol during bolus administration of intravenous  contrast. CONTRAST:  64mL ISOVUE-300 IOPAMIDOL (ISOVUE-300) INJECTION 61% COMPARISON:  CT the chest, abdomen and pelvis 12/02/2017. FINDINGS: CT CHEST FINDINGS Cardiovascular: Heart size is normal. There is no significant pericardial fluid, thickening or pericardial calcification. Aortic atherosclerosis. No definite coronary artery calcifications. Right internal jugular single-lumen porta cath with tip terminating in the right atrium. Mediastinum/Nodes: No pathologically enlarged mediastinal or hilar lymph nodes. Esophagus is unremarkable in appearance. No axillary lymphadenopathy. Lungs/Pleura: 3 mm nodule in the anterior aspect of the right lower lobe (axial image 110 of series 4), decreased slightly compared to the prior study. 5 mm nodule in the medial aspect of the left lower lobe (axial image 130 of series 4), stable compared to the prior examination. There continues to be areas of chronic mucoid impaction within left lower lobe bronchi with some associated surrounding scarring. Scarring is most evident in the superior segment of the left lower lobe where there is a pleural-based area of architectural distortion measuring 7 x 14 mm (axial image 71 of series 4), stable compared to prior examinations (including prior PET-CT 04/12/2017, at which time this area was not hypermetabolic). No other new suspicious appearing pulmonary nodules or masses are noted. No acute consolidative airspace disease. No pleural effusions. Diffuse bronchial wall thickening with moderate centrilobular and paraseptal emphysema. Musculoskeletal: There are no aggressive appearing lytic or blastic lesions noted in the visualized portions of the skeleton. CT ABDOMEN PELVIS FINDINGS Hepatobiliary: No suspicious cystic or solid hepatic lesions. Mild intrahepatic biliary ductal dilatation, similar to the prior study. Small amount of pneumobilia. Common bile duct stent in position with tip extending into the duodenum. Gallbladder is  unremarkable in appearance. Pancreas: Previously noted hypovascular lesion in the head and uncinate process of the pancreas appears slightly larger than the prior study measuring 3.2 x 3.3 x 4.1 cm (axial image 76 of series 2 and sagittal image 78 of series 7). This lesion is intimately associated with the distal aspect of the common bile duct (stented). The lesion comes in contact with the superior mesenteric artery encasing the vessel from approximately 5:00 to 1:00 along its right margin. Lesion also comes in contact with the distal aspect of the superior mesenteric vein, partially obliterating the intervening fat plane. The undersurface of the lesion comes in contact with the splenic vein and splenoportal confluence. The posterior aspect of the lesion also comes in broad contact with the IVC and infrarenal abdominal aorta, obliterating the intervening fat planes. This is associated with some pancreatic ductal ectasia up to 3 mm in the body of the pancreas. Body and tail of the pancreas are otherwise unremarkable in appearance. Spleen: Normal in size and unremarkable in appearance. Adrenals/Urinary Tract: Bilateral kidneys and bilateral adrenal glands are normal in  appearance. No hydroureteronephrosis. Urinary bladder is normal in appearance. Stomach/Bowel: Normal appearance of the stomach. No pathologic dilatation of small bowel or colon. Numerous colonic diverticulae are noted, particularly in the sigmoid colon, without surrounding inflammatory changes to suggest an acute diverticulitis at this time. The appendix is not confidently identified and may be surgically absent. Regardless, there are no inflammatory changes noted adjacent to the cecum to suggest the presence of an acute appendicitis at this time. Vascular/Lymphatic: Aortic atherosclerosis, without evidence of aneurysm or dissection in the abdominal or pelvic vasculature. Borderline enlarged portacaval lymph node measuring 1.2 cm in short axis, stable  compared to the prior examination. No other definite pathologically enlarged lymph nodes are noted elsewhere in the abdomen or pelvis. Reproductive: Prostate gland and seminal vesicles are unremarkable in appearance. Other: No significant volume of ascites.  No pneumoperitoneum. Musculoskeletal: There are no aggressive appearing lytic or blastic lesions noted in the visualized portions of the skeleton. IMPRESSION: 1. Mild enlargement of the lesion in the pancreatic head, indicative of slight progression of disease. The lesion currently measures 3.2 x 3.3 x 4.1 cm. 2. Stable borderline enlarged portacaval lymph node measuring 1.2 cm in short axis. 3. Stable small pulmonary nodules and areas of scarring in the lungs, as detailed above. Continued attention on any future routine follow-up studies is recommended. 4. Aortic atherosclerosis. 5. Colonic diverticulosis without evidence to suggest an acute diverticulitis at this time. 6. Additional incidental findings, similar to prior studies, as above. Electronically Signed   By: Vinnie Langton M.D.   On: 04/03/2018 10:09   Ct Abdomen Pelvis W Contrast  Result Date: 04/03/2018 CLINICAL DATA:  58 year old male with history of pancreatic cancer originally diagnosed in July 2018, treated with chemotherapy. Follow-up study. EXAM: CT CHEST, ABDOMEN, AND PELVIS WITH CONTRAST TECHNIQUE: Multidetector CT imaging of the chest, abdomen and pelvis was performed following the standard protocol during bolus administration of intravenous contrast. CONTRAST:  30mL ISOVUE-300 IOPAMIDOL (ISOVUE-300) INJECTION 61% COMPARISON:  CT the chest, abdomen and pelvis 12/02/2017. FINDINGS: CT CHEST FINDINGS Cardiovascular: Heart size is normal. There is no significant pericardial fluid, thickening or pericardial calcification. Aortic atherosclerosis. No definite coronary artery calcifications. Right internal jugular single-lumen porta cath with tip terminating in the right atrium.  Mediastinum/Nodes: No pathologically enlarged mediastinal or hilar lymph nodes. Esophagus is unremarkable in appearance. No axillary lymphadenopathy. Lungs/Pleura: 3 mm nodule in the anterior aspect of the right lower lobe (axial image 110 of series 4), decreased slightly compared to the prior study. 5 mm nodule in the medial aspect of the left lower lobe (axial image 130 of series 4), stable compared to the prior examination. There continues to be areas of chronic mucoid impaction within left lower lobe bronchi with some associated surrounding scarring. Scarring is most evident in the superior segment of the left lower lobe where there is a pleural-based area of architectural distortion measuring 7 x 14 mm (axial image 71 of series 4), stable compared to prior examinations (including prior PET-CT 04/12/2017, at which time this area was not hypermetabolic). No other new suspicious appearing pulmonary nodules or masses are noted. No acute consolidative airspace disease. No pleural effusions. Diffuse bronchial wall thickening with moderate centrilobular and paraseptal emphysema. Musculoskeletal: There are no aggressive appearing lytic or blastic lesions noted in the visualized portions of the skeleton. CT ABDOMEN PELVIS FINDINGS Hepatobiliary: No suspicious cystic or solid hepatic lesions. Mild intrahepatic biliary ductal dilatation, similar to the prior study. Small amount of pneumobilia. Common bile duct stent in  position with tip extending into the duodenum. Gallbladder is unremarkable in appearance. Pancreas: Previously noted hypovascular lesion in the head and uncinate process of the pancreas appears slightly larger than the prior study measuring 3.2 x 3.3 x 4.1 cm (axial image 76 of series 2 and sagittal image 78 of series 7). This lesion is intimately associated with the distal aspect of the common bile duct (stented). The lesion comes in contact with the superior mesenteric artery encasing the vessel from  approximately 5:00 to 1:00 along its right margin. Lesion also comes in contact with the distal aspect of the superior mesenteric vein, partially obliterating the intervening fat plane. The undersurface of the lesion comes in contact with the splenic vein and splenoportal confluence. The posterior aspect of the lesion also comes in broad contact with the IVC and infrarenal abdominal aorta, obliterating the intervening fat planes. This is associated with some pancreatic ductal ectasia up to 3 mm in the body of the pancreas. Body and tail of the pancreas are otherwise unremarkable in appearance. Spleen: Normal in size and unremarkable in appearance. Adrenals/Urinary Tract: Bilateral kidneys and bilateral adrenal glands are normal in appearance. No hydroureteronephrosis. Urinary bladder is normal in appearance. Stomach/Bowel: Normal appearance of the stomach. No pathologic dilatation of small bowel or colon. Numerous colonic diverticulae are noted, particularly in the sigmoid colon, without surrounding inflammatory changes to suggest an acute diverticulitis at this time. The appendix is not confidently identified and may be surgically absent. Regardless, there are no inflammatory changes noted adjacent to the cecum to suggest the presence of an acute appendicitis at this time. Vascular/Lymphatic: Aortic atherosclerosis, without evidence of aneurysm or dissection in the abdominal or pelvic vasculature. Borderline enlarged portacaval lymph node measuring 1.2 cm in short axis, stable compared to the prior examination. No other definite pathologically enlarged lymph nodes are noted elsewhere in the abdomen or pelvis. Reproductive: Prostate gland and seminal vesicles are unremarkable in appearance. Other: No significant volume of ascites.  No pneumoperitoneum. Musculoskeletal: There are no aggressive appearing lytic or blastic lesions noted in the visualized portions of the skeleton. IMPRESSION: 1. Mild enlargement of the  lesion in the pancreatic head, indicative of slight progression of disease. The lesion currently measures 3.2 x 3.3 x 4.1 cm. 2. Stable borderline enlarged portacaval lymph node measuring 1.2 cm in short axis. 3. Stable small pulmonary nodules and areas of scarring in the lungs, as detailed above. Continued attention on any future routine follow-up studies is recommended. 4. Aortic atherosclerosis. 5. Colonic diverticulosis without evidence to suggest an acute diverticulitis at this time. 6. Additional incidental findings, similar to prior studies, as above. Electronically Signed   By: Vinnie Langton M.D.   On: 04/03/2018 10:09    ASSESSMENT: Stage IV pancreatic cancer  PLAN:   1. Stage IV pancreatic cancer: Imaging, pathology, and Op note reviewed independently confirming stage IV disease with multiple peritoneal implants.  CT scan results from April 03, 2018 reviewed independently and report as above with mild progression of disease.  His CA 19-9 is also increasing and is now 762.  Despite this, will proceed with cycle 12 of CAPEOX today.  He will receive oxaliplatin today and 14 days of oral capecitabine.  Patient will then have 7 days off.  Can always reconsider modified FOLFIRINOX (oxaliplatin 85 mg/m, leucovorin 400 mg/m, irinotecan 150 mg/m, 5-FU 2400 mg/m over 46 hours) every 2 weeks in the future.  Will get a PET scan for further evaluation.  If patient has no obvious  evidence of disease outside his pancreas, can consider referral to radiation oncology for SBRT.  Return to clinic in 3 weeks for further evaluation and additional treatment planning. 2.  Elevated liver enzymes: AST and ALT are trending up.  Monitor and proceed with treatment as above. Patient has not been evaluated by GI secondary to transportation issues. 3. Hyponatremia: Chronic and relatively unchanged.  Patient sodium is 132 today. 4. Peripheral edema: Continue elevation nightly.  Lower extremity ultrasound did not reveal  DVT.   5.  Elevated bilirubin: Total bilirubin is 1.5 today.  Monitor. 6.  Pain: Slightly worse today.  Continue oxycodone at night and patient was given a prescription for tramadol for daytime use. 7.  Poor appetite: Patient has Megace prescribed, but is unclear his compliance.   8.  Shortness of breath: Chronic and unchanged.  Patient does not complain of this today.  Continue inhalers as prescribed. 9.  Shoulder pain/tremor: Patient was previously given a referral to orthopedics as he is received injections in the past that improved his symptoms.  Patient expressed understanding and was in agreement with this plan. He also understands that He can call clinic at any time with any questions, concerns, or complaints.   Cancer Staging Pancreatic cancer [facility]) Staging form: Exocrine Pancreas, AJCC 8th Edition - Clinical stage from 09/25/2016: Stage IV (cTX, cNX, pM1) - Signed by Lloyd Huger, MD on 09/25/2016   Lloyd Huger, MD   04/07/2018 2:14 PM

## 2018-04-03 ENCOUNTER — Ambulatory Visit
Admission: RE | Admit: 2018-04-03 | Discharge: 2018-04-03 | Disposition: A | Discharge status: Home / Self Care | Payer: Medicaid Other | Source: Ambulatory Visit | Attending: Oncology | Admitting: Oncology

## 2018-04-03 DIAGNOSIS — C259 Malignant neoplasm of pancreas, unspecified: Secondary | ICD-10-CM | POA: Diagnosis not present

## 2018-04-03 MED ORDER — IOPAMIDOL (ISOVUE-300) INJECTION 61%
75.0000 mL | Freq: Once | INTRAVENOUS | Status: AC | PRN
  Administered 2018-04-03: 75 mL via INTRAVENOUS

## 2018-04-04 ENCOUNTER — Other Ambulatory Visit: Payer: Self-pay | Admitting: Oncology

## 2018-04-04 DIAGNOSIS — C259 Malignant neoplasm of pancreas, unspecified: Secondary | ICD-10-CM

## 2018-04-04 NOTE — Telephone Encounter (Signed)
...   Ref Range & Units 2wk ago (03/16/18) 33mo ago (02/16/18) 48mo ago (01/18/18) 51mo ago (12/28/17) 37mo ago (12/07/17) 72mo ago (11/16/17) 64mo ago (10/19/17)  WBC 4.0 - 10.5 K/uL 6.5  7.2  7.2  7.2 R 6.1 R 8.3 R 8.4 R  RBC 4.22 - 5.81 MIL/uL 4.56  4.77  5.03  5.06 R 4.81 R 4.73 R 4.41 R  Hemoglobin 13.0 - 17.0 g/dL 14.8  15.1  15.3  16.5 R 15.8 R 15.8 R 14.5 R  HCT 39.0 - 52.0 % 42.0  42.5  44.0  47.4 R 46.0 R 45.7 R 41.5 R  MCV 80.0 - 100.0 fL 92.1  89.1  87.5  93.7  95.7  96.5  94.1   MCH 26.0 - 34.0 pg 32.5  31.7  30.4  32.6  32.8  33.3  32.8   MCHC 30.0 - 36.0 g/dL 35.2  35.5  34.8  34.8 R 34.3 R 34.5 R 34.9 R  RDW 11.5 - 15.5 % 19.2High   17.6High   15.2  14.2 R 13.9 R 14.1 R 17.0High  R  Platelets 150 - 400 K/uL 226  204  245  245 R 223 R 270 R 284 R  nRBC 0.0 - 0.2 % 0.0  0.0  0.0       Neutrophils Relative % % 56  56  46  51  46  54  54   Neutro Abs 1.7 - 7.7 K/uL 3.6  4.0  3.4  3.7 R 2.8 R 4.5 R 4.6 R  Lymphocytes Relative % 21  23  26  25  28  25  22    Lymphs Abs 0.7 - 4.0 K/uL 1.4  1.7  1.9  1.8 R 1.7 R 2.0 R 1.8 R  Monocytes Relative % 14  12  16  15  13  11  14    Monocytes Absolute 0.1 - 1.0 K/uL 0.9  0.9  1.2High   1.1High  R 0.8 R 0.9 R 1.2High  R  Eosinophils Relative % 8  8  11  8  12  10  8    Eosinophils Absolute 0.0 - 0.5 K/uL 0.5  0.6High   0.8High   0.5 R 0.7 R 0.8High  R 0.6 R  Basophils Relative % 1  1  1  1  1   0  2   Basophils Absolute 0.0 - 0.1 K/uL 0.1  0.1  0.1  0.1 R, CM 0.1 R, CM 0.0 R, CM 0.1 R, CM  Immature Granulocytes % 0  0  0       Abs Immature Granulocytes 0.00 - 0.07 K/uL 0.02  0.01 CM 0.02 CM      Comment: Performed at [facility], [address] [place], [place] [address]  Resulting Agency  [facility] [place] [facility] [place] [facility] [place] [facility] [place] [facility] [place] [facility] [place] [facility]      Specimen Collected: 03/16/18 08:43  Last Resulted: 03/16/18 09:04     Lab Flowsheet    Order Details    View Encounter    Lab and Collection Details     Routing    Result History      CM=Additional commentsR=Reference range differs from displayed range      Other Results from 03/16/2018   Magnesium  Order: [id number]   Status:  Final result  Visible to patient:  No (Not Released)  Next appt:  04/06/2018 at 09:00 AM  in Oncology ([facility])  Dx:  Malignant neoplasm of pancreas, unspe...   Ref Range & Units 2wk ago (03/16/18) 50mo ago (02/16/18) 6mo ago (01/18/18) 43mo ago (12/28/17) 14mo ago (12/07/17) 76mo ago (11/16/17) 51mo ago (10/19/17)  Magnesium 1.7 - 2.4 mg/dL 1.7  1.7 CM 1.8 CM 1.7 CM 1.7 CM 1.9 CM 1.7 CM  Comment: Performed at [facility], [address] [place], [place] [address]  Resulting Agency  [facility] CLIN LAB [place] CL[facility] LAB [place] [facility] [place] [facility] [place] [facility] [place] [facility] [place] [facility]      Specimen Collected: 03/16/18 08:43  Last Resulted: 03/16/18 09:21     Lab Flowsheet    Order Details    View Encounter    Lab and Collection Details    Routing    Result History      CM=Additional comments        Contains abnormal data Cancer antigen 19-9  Order: [id number]   Status:  Final result  Visible to patient:  No (Not Released)  Next appt:  04/06/2018 at 09:00 AM in Oncology ([facility])  Dx:  Malignant neoplasm of pancreas, unspe...   Ref Range & Units 2wk ago (03/16/18) 63mo ago (02/16/18) 64mo ago (01/18/18) 20mo ago (12/28/17) 71mo ago (12/07/17) 3mo ago (11/16/17) 8mo ago (10/19/17)  CA 19-9 0 - 35 U/mL 545High   495High  CM 462High  CM 443High  CM 377High  CM 246High  CM 247High  CM  Comment: (NOTE)  Roche Diagnostics Electrochemiluminescence Immunoassay (ECLIA)  Values obtained with different assay methods or kits cannot be  used interchangeably. Results cannot be interpreted as absolute  evidence of the presence or absence of malignant disease.  Performed At: [facility]  [address] [place], [place] [id number]  Rush Farmer MD FO:[id number]   Resulting Agency  [place] [facility]  LAB [place] CLIN LAB [place] CL[facility] LAB [place] [facility] [place] [facility] [place] [facility] LAB      Specimen Collected: 03/16/18 08:43  Last Resulted: 03/17/18 06:37     Lab Flowsheet    Order Details    View Encounter    Lab and Collection Details    Routing    Result History      CM=Additional comments        Contains abnormal data Comprehensive metabolic panel  Order: [id number]   Status:  Final result  Visible to patient:  No (Not Released)  Next appt:  04/06/2018 at 09:00 AM in Oncology ([facility])  Dx:  Malignant neoplasm of pancreas, unspe...   Ref Range & Units 2wk ago (03/16/18) 48mo ago (02/16/18) 36mo ago (01/18/18) 7mo ago (12/28/17) 35mo ago (12/07/17) 2mo ago (11/16/17) 5mo ago (10/19/17)  Sodium 135 - 145 mmol/L 129Low   133Low   132Low   131Low   131Low   129Low   129Low    Potassium 3.5 - 5.1 mmol/L 3.9  3.7  3.7  4.1  3.9  4.2  4.4   Chloride 98 - 111 mmol/L 95Low   100  101  95Low   95Low   96Low   96Low    CO2 22 - 32 mmol/L 23  25  24  25  26  27  24    Glucose, Bld 70 - 99 mg/dL 115High   107High   116High   115High   101High   104High   114High    BUN 6 - 20 mg/dL 8  8  5Low   7  6  10  10    Creatinine, Ser 0.61 - 1.24 mg/dL <0.30Low   <0.30Low   0.35Low   0.40Low   0.30Low   0.33Low   0.34Low    Calcium 8.9 - 10.3 mg/dL 9.4  9.3  9.6  9.5  9.1  9.3  9.0   Total Protein 6.5 - 8.1 g/dL 7.1   7.5  7.4  7.2  7.4  7.0   Albumin 3.5 - 5.0 g/dL 3.9   3.8  3.7  3.7  3.7  3.3Low    AST 15 - 41 U/L 159High    137High   151High   142High   173High   140High    ALT 0 - 44 U/L 159High    151High   133High   140High   177High   161High    Alkaline Phosphatase 38 - 126 U/L 998High    875High   1,146High   901High   1,201High   1,215High    Total Bilirubin 0.3 - 1.2 mg/dL 1.4High    1.2  0.7  0.7  0.8  0.8   GFR calc non Af Amer >60 mL/min NOT CALCULATED  NOT CALCULATED  >60  >60  >60  >60  >60   GFR calc Af Amer >60 mL/min NOT CALCULATED  NOT CALCULATED CM >60 CM >60 CM >60 CM  >60 CM >60 CM  Anion gap 5 - 15 11  8  CM 7 CM 11 CM 10 CM 6 CM 9 CM  Comment: Performed at [facility], [address] [place], [place] [address]  Resulting Agency  [facility] CLIN LAB [place] CLIN LAB [place] CL[facility] [place] [facility] [place] [facility] [place] [facility] [place] [facility]      Specimen Collected: 03/16/18 08:43  Last Resulted: 03/16/18 09:21

## 2018-04-06 ENCOUNTER — Inpatient Hospital Stay: Payer: Medicaid Other

## 2018-04-06 ENCOUNTER — Other Ambulatory Visit: Payer: Self-pay

## 2018-04-06 ENCOUNTER — Inpatient Hospital Stay (HOSPITAL_BASED_OUTPATIENT_CLINIC_OR_DEPARTMENT_OTHER): Payer: Medicaid Other | Admitting: Oncology

## 2018-04-06 ENCOUNTER — Inpatient Hospital Stay: Payer: Medicaid Other | Attending: Oncology

## 2018-04-06 ENCOUNTER — Other Ambulatory Visit: Payer: Self-pay | Admitting: *Deleted

## 2018-04-06 VITALS — BP 122/81 | HR 91 | Temp 97.6°F | Wt 116.0 lb

## 2018-04-06 DIAGNOSIS — E871 Hypo-osmolality and hyponatremia: Secondary | ICD-10-CM

## 2018-04-06 DIAGNOSIS — C786 Secondary malignant neoplasm of retroperitoneum and peritoneum: Secondary | ICD-10-CM

## 2018-04-06 DIAGNOSIS — R109 Unspecified abdominal pain: Secondary | ICD-10-CM

## 2018-04-06 DIAGNOSIS — M25519 Pain in unspecified shoulder: Secondary | ICD-10-CM

## 2018-04-06 DIAGNOSIS — C259 Malignant neoplasm of pancreas, unspecified: Secondary | ICD-10-CM

## 2018-04-06 DIAGNOSIS — R748 Abnormal levels of other serum enzymes: Secondary | ICD-10-CM

## 2018-04-06 DIAGNOSIS — Z5111 Encounter for antineoplastic chemotherapy: Secondary | ICD-10-CM | POA: Insufficient documentation

## 2018-04-06 DIAGNOSIS — R0602 Shortness of breath: Secondary | ICD-10-CM

## 2018-04-06 DIAGNOSIS — F17210 Nicotine dependence, cigarettes, uncomplicated: Secondary | ICD-10-CM

## 2018-04-06 DIAGNOSIS — M549 Dorsalgia, unspecified: Secondary | ICD-10-CM

## 2018-04-06 DIAGNOSIS — R600 Localized edema: Secondary | ICD-10-CM

## 2018-04-06 DIAGNOSIS — R630 Anorexia: Secondary | ICD-10-CM

## 2018-04-06 DIAGNOSIS — R251 Tremor, unspecified: Secondary | ICD-10-CM

## 2018-04-06 LAB — CBC WITH DIFFERENTIAL/PLATELET
Abs Immature Granulocytes: 0.01 10*3/uL (ref 0.00–0.07)
Basophils Absolute: 0.1 10*3/uL (ref 0.0–0.1)
Basophils Relative: 1 %
Eosinophils Absolute: 0.4 10*3/uL (ref 0.0–0.5)
Eosinophils Relative: 6 %
HCT: 42.6 % (ref 39.0–52.0)
HEMOGLOBIN: 14.8 g/dL (ref 13.0–17.0)
Immature Granulocytes: 0 %
Lymphocytes Relative: 21 %
Lymphs Abs: 1.4 10*3/uL (ref 0.7–4.0)
MCH: 33.2 pg (ref 26.0–34.0)
MCHC: 34.7 g/dL (ref 30.0–36.0)
MCV: 95.5 fL (ref 80.0–100.0)
Monocytes Absolute: 1 10*3/uL (ref 0.1–1.0)
Monocytes Relative: 14 %
Neutro Abs: 3.9 10*3/uL (ref 1.7–7.7)
Neutrophils Relative %: 58 %
Platelets: 212 10*3/uL (ref 150–400)
RBC: 4.46 MIL/uL (ref 4.22–5.81)
RDW: 17.3 % — ABNORMAL HIGH (ref 11.5–15.5)
WBC: 6.8 10*3/uL (ref 4.0–10.5)
nRBC: 0 % (ref 0.0–0.2)

## 2018-04-06 LAB — COMPREHENSIVE METABOLIC PANEL
ALT: 166 U/L — AB (ref 0–44)
AST: 174 U/L — ABNORMAL HIGH (ref 15–41)
Albumin: 3.6 g/dL (ref 3.5–5.0)
Alkaline Phosphatase: 977 U/L — ABNORMAL HIGH (ref 38–126)
Anion gap: 9 (ref 5–15)
BUN: 8 mg/dL (ref 6–20)
CO2: 26 mmol/L (ref 22–32)
CREATININE: 0.37 mg/dL — AB (ref 0.61–1.24)
Calcium: 9 mg/dL (ref 8.9–10.3)
Chloride: 97 mmol/L — ABNORMAL LOW (ref 98–111)
GFR calc non Af Amer: >60 mL/min (ref >60)
Glucose, Bld: 107 mg/dL — ABNORMAL HIGH (ref 70–99)
Potassium: 3.9 mmol/L (ref 3.5–5.1)
SODIUM: 132 mmol/L — AB (ref 135–145)
Total Bilirubin: 1.5 mg/dL — ABNORMAL HIGH (ref 0.3–1.2)
Total Protein: 7.1 g/dL (ref 6.5–8.1)

## 2018-04-06 LAB — MAGNESIUM: Magnesium: 1.7 mg/dL (ref 1.7–2.4)

## 2018-04-06 MED ORDER — SODIUM CHLORIDE 0.9% FLUSH
10.0000 mL | Freq: Once | INTRAVENOUS | Status: AC
  Administered 2018-04-06: 10 mL via INTRAVENOUS
  Filled 2018-04-06: qty 10

## 2018-04-06 MED ORDER — DEXAMETHASONE SODIUM PHOSPHATE 10 MG/ML IJ SOLN
10.0000 mg | Freq: Once | INTRAMUSCULAR | Status: AC
  Administered 2018-04-06: 10 mg via INTRAVENOUS
  Filled 2018-04-06: qty 1

## 2018-04-06 MED ORDER — PALONOSETRON HCL INJECTION 0.25 MG/5ML
0.2500 mg | Freq: Once | INTRAVENOUS | Status: AC
  Administered 2018-04-06: 0.25 mg via INTRAVENOUS
  Filled 2018-04-06: qty 5

## 2018-04-06 MED ORDER — DEXTROSE 5 % IV SOLN
Freq: Once | INTRAVENOUS | Status: AC
  Administered 2018-04-06: 10:00:00 via INTRAVENOUS
  Filled 2018-04-06: qty 250

## 2018-04-06 MED ORDER — IBUPROFEN 200 MG PO TABS
400.0000 mg | ORAL_TABLET | Freq: Once | ORAL | Status: AC
  Administered 2018-04-06: 400 mg via ORAL
  Filled 2018-04-06: qty 2

## 2018-04-06 MED ORDER — OXALIPLATIN CHEMO INJECTION 100 MG/20ML
150.0000 mg | Freq: Once | INTRAVENOUS | Status: AC
  Administered 2018-04-06: 150 mg via INTRAVENOUS
  Filled 2018-04-06: qty 20

## 2018-04-06 MED ORDER — TRAMADOL HCL 50 MG PO TABS: 50.0000 mg | ORAL_TABLET | Freq: Four times a day (QID) | ORAL | 0 refills | Status: DC | PRN

## 2018-04-06 MED ORDER — HEPARIN SOD (PORK) LOCK FLUSH 100 UNIT/ML IV SOLN
500.0000 [IU] | Freq: Once | INTRAVENOUS | Status: AC
  Administered 2018-04-06: 500 [IU] via INTRAVENOUS
  Filled 2018-04-06: qty 5

## 2018-04-06 NOTE — Progress Notes (Signed)
Patient here today for follow up regarding pancreatic cancer, rated pain 6/10 in back and abdomen this morning.

## 2018-04-07 LAB — CANCER ANTIGEN 19-9: CA 19-9: 762 U/mL — ABNORMAL HIGH (ref 0–35)

## 2018-04-10 MED FILL — XELODA 500 MG TABLET: 500 | 21 days supply | Qty: 84 | Fill #0

## 2018-04-21 ENCOUNTER — Telehealth: Payer: Self-pay | Admitting: *Deleted

## 2018-04-23 NOTE — Progress Notes (Deleted)
[place]  [phone] [phone]  ID: [name]. OB: [birth year]  MR#: [id number]  ZWC#:[id number]  Patient Care Team: Patient, [name] as PCP - General (General Practice) [name], MD as Consulting Physician (Oncology) [name], MD (Inactive) as Consulting Physician (Gastroenterology) [name], MD as Consulting Physician (Gastroenterology)  CHIEF COMPLAINT: Stage IV pancreatic cancer  INTERVAL HISTORY: Patient returns to clinic today for further evaluation, discussion of his imaging results, and consideration of cycle 12 of CAPOX.  Patient continues to have increased abdominal and back pain, but otherwise feels well.  He has chronic weakness and fatigue that is unchanged. He has a poor appetite, but no weight loss. He has no other neurologic complaints. He denies any recent fevers or illnesses.  He denies any chest pain, shortness of breath, cough, or hemoptysis.  He denies any nausea, vomiting, constipation, or diarrhea. He has no urinary complaints.  Patient offers no further specific complaints today.  REVIEW OF SYSTEMS:   Review of Systems  Constitutional: Positive for malaise/fatigue. Negative for fever and weight loss.  Respiratory: Negative.  Negative for cough and shortness of breath.   Cardiovascular: Negative.  Negative for chest pain and leg swelling.  Gastrointestinal: Positive for abdominal pain. Negative for diarrhea, nausea and vomiting.  Genitourinary: Positive for flank pain. Negative for dysuria, frequency and urgency.  Musculoskeletal: Positive for back pain and joint pain.  Skin: Negative.  Negative for itching and rash.  Neurological: Positive for tingling, tremors, sensory change and weakness. Negative for focal weakness.  Psychiatric/Behavioral: Negative.  The patient is not nervous/anxious.     As per HPI. Otherwise, a complete review of systems is negative.  PAST MEDICAL HISTORY: Past  Medical History:  Diagnosis Date  . Arthritis   . Asthma   . Atrial fibrillation ([place])   . Closed left ankle fracture age 58  . COPD (chronic obstructive pulmonary disease) ([place])   . Dysrhythmia   . Pancreatic cancer ([place]) 06/2016   Chemo tx's.   . Pneumonia 2011  . Right shoulder injury 09/27/2016  . Sciatica    left leg pinched nerve numb at times about once a year    PAST SURGICAL HISTORY: Past Surgical History:  Procedure Laterality Date  . BILIARY STENT PLACEMENT N/A 09/01/2016   Procedure: BILIARY STENT PLACEMENT;  Surgeon: [name], MD;  Location: [facility];  Service: Endoscopy;  Laterality: N/A;  . DIAGNOSTIC LAPAROSCOPIC LIVER BIOPSY  09/16/2016   Procedure: DIAGNOSTIC LAPAROSCOPIC LIVER BIOPSY;  Surgeon: [name], MD;  Location: [facility];  Service: General;;  . ERCP N/A 07/09/2016   Procedure: ENDOSCOPIC RETROGRADE CHOLANGIOPANCREATOGRAPHY (ERCP);  Surgeon: [name], MD;  Location: [facility];  Service: Endoscopy;  Laterality: N/A;  . ERCP N/A 09/01/2016   Procedure: ENDOSCOPIC RETROGRADE CHOLANGIOPANCREATOGRAPHY (ERCP);  Surgeon: [name], MD;  Location: [facility];  Service: Endoscopy;  Laterality: N/A;  . ERCP N/A 06/28/2017   Procedure: ENDOSCOPIC RETROGRADE CHOLANGIOPANCREATOGRAPHY (ERCP);  Surgeon: [name], MD;  Location: [facility];  Service: Endoscopy;  Laterality: N/A;  . ESOPHAGOGASTRODUODENOSCOPY (EGD) WITH PROPOFOL N/A 09/01/2016   Procedure: ESOPHAGOGASTRODUODENOSCOPY (EGD) WITH PROPOFOL;  Surgeon: [name], MD;  Location: [facility];  Service: Gastroenterology;  Laterality: N/A;  . EUS N/A 09/01/2016   Procedure: ESOPHAGEAL ENDOSCOPIC ULTRASOUND (EUS) RADIAL;  Surgeon: [name], MD;  Location: [facility];  Service: Endoscopy;  Laterality: N/A;  . FINE NEEDLE ASPIRATION N/A 09/01/2016   Procedure: FINE NEEDLE ASPIRATION (FNA) RADIAL;  Surgeon:  [name], MD;  Location: [facility];  Service: Endoscopy;  Laterality: N/A;    . FOREIGN BODY REMOVAL N/A 09/01/2016   Procedure: FOREIGN BODY REMOVAL;  Surgeon: [name], MD;  Location: [facility];  Service: Gastroenterology;  Laterality: N/A;  . KNEE SURGERY Right 2002   arthroscopy  . PORTA CATH INSERTION N/A 09/20/2017   Procedure: PORTA CATH INSERTION;  Surgeon: [name], MD;  Location: [facility];  Service: Cardiovascular;  Laterality: N/A;  . SHOULDER SURGERY Right 2002    FAMILY HISTORY: Family History  Problem Relation Age of Onset  . Heart disease Father     ADVANCED DIRECTIVES (Y/N):  N  HEALTH MAINTENANCE: Social History   Tobacco Use  . Smoking status: Current Every Day Smoker    Packs/day: 1.00    Years: 40.00    Pack years: 40.00    Types: Cigarettes  . Smokeless tobacco: Never Used  Substance Use Topics  . Alcohol use: Yes    Alcohol/week: 7.0 standard drinks    Types: 7 Cans of beer per week    Comment: last drink 5 weeeks ago  april 2018  . Drug use: No     Colonoscopy:  PAP:  Bone density:  Lipid panel:  Allergies  Allergen Reactions  . No Known Allergies     Current Outpatient Medications  Medication Sig Dispense Refill  . acetaminophen (TYLENOL) 500 MG tablet Take 1,000 mg by mouth every 8 (eight) hours as needed.    Marland Kitchen albuterol (PROVENTIL HFA;VENTOLIN HFA) 108 (90 Base) MCG/ACT inhaler Inhale 1-2 puffs into the lungs every 6 (six) hours as needed for wheezing or shortness of breath. 1 Inhaler 6  . Ipratropium-Albuterol (COMBIVENT RESPIMAT) 20-100 MCG/ACT AERS respimat Inhale 1 puff into the lungs 4 (four) times daily. 1 Inhaler 6  . megestrol (MEGACE) 40 MG tablet Take 1 tablet (40 mg total) by mouth daily. 30 tablet 2  . Oxycodone HCl 10 MG TABS Take 1 tablet (10 mg total) by mouth 2 (two) times daily as needed. 60 tablet 0  . traMADol (ULTRAM) 50 MG tablet Take 1 tablet (50 mg total) by mouth every 6 (six) hours as needed. 30 tablet 0  . XELODA 500 MG tablet TAKE 3 TABLETS (1,500 MG TOTAL) BY  MOUTH 2 (TWO) TIMES DAILY AFTER A MEAL. TAKE FOR 14 DAYS ON, THEN 7 DAYS OFF. 84 tablet 2   No current facility-administered medications for this visit.    OBJECTIVE: There were no vitals filed for this visit.   There is no height or weight on file to calculate BMI.    ECOG FS:1 - Symptomatic but completely ambulatory  General: Thin, no acute distress. Eyes: Pink conjunctiva, anicteric sclera. HEENT: Normocephalic, moist mucous membranes, clear oropharnyx. Lungs: Clear to auscultation bilaterally. Heart: Regular rate and rhythm. No rubs, murmurs, or gallops. Abdomen: Soft, nontender, nondistended. No organomegaly noted, normoactive bowel sounds. Musculoskeletal: No edema, cyanosis, or clubbing. Neuro: Alert, answering all questions appropriately. Cranial nerves grossly intact. Skin: No rashes or petechiae noted. Psych: Normal affect.  LAB RESULTS:  Lab Results  Component Value Date   NA 132 (L) 04/06/2018   K 3.9 04/06/2018   CL 97 (L) 04/06/2018   CO2 26 04/06/2018   GLUCOSE 107 (H) 04/06/2018   BUN 8 04/06/2018   CREATININE 0.37 (L) 04/06/2018   CALCIUM 9.0 04/06/2018   PROT 7.1 04/06/2018   ALBUMIN 3.6 04/06/2018   AST 174 (H) 04/06/2018   ALT 166 (H)  04/06/2018   ALKPHOS 977 (H) 04/06/2018   BILITOT 1.5 (H) 04/06/2018   GFRNONAA >60 04/06/2018   GFRAA >60 04/06/2018    Lab Results  Component Value Date   WBC 6.8 04/06/2018   NEUTROABS 3.9 04/06/2018   HGB 14.8 04/06/2018   HCT 42.6 04/06/2018   MCV 95.5 04/06/2018   PLT 212 04/06/2018      STUDIES: Ct Chest W Contrast  Result Date: 04/03/2018 CLINICAL DATA:  58 year old male with history of pancreatic cancer originally diagnosed in July 2018, treated with chemotherapy. Follow-up study. EXAM: CT CHEST, ABDOMEN, AND PELVIS WITH CONTRAST TECHNIQUE: Multidetector CT imaging of the chest, abdomen and pelvis was performed following the standard protocol during bolus administration of intravenous contrast. CONTRAST:   47mL ISOVUE-300 IOPAMIDOL (ISOVUE-300) INJECTION 61% COMPARISON:  CT the chest, abdomen and pelvis 12/02/2017. FINDINGS: CT CHEST FINDINGS Cardiovascular: Heart size is normal. There is no significant pericardial fluid, thickening or pericardial calcification. Aortic atherosclerosis. No definite coronary artery calcifications. Right internal jugular single-lumen porta cath with tip terminating in the right atrium. Mediastinum/Nodes: No pathologically enlarged mediastinal or hilar lymph nodes. Esophagus is unremarkable in appearance. No axillary lymphadenopathy. Lungs/Pleura: 3 mm nodule in the anterior aspect of the right lower lobe (axial image 110 of series 4), decreased slightly compared to the prior study. 5 mm nodule in the medial aspect of the left lower lobe (axial image 130 of series 4), stable compared to the prior examination. There continues to be areas of chronic mucoid impaction within left lower lobe bronchi with some associated surrounding scarring. Scarring is most evident in the superior segment of the left lower lobe where there is a pleural-based area of architectural distortion measuring 7 x 14 mm (axial image 71 of series 4), stable compared to prior examinations (including prior PET-CT 04/12/2017, at which time this area was not hypermetabolic). No other new suspicious appearing pulmonary nodules or masses are noted. No acute consolidative airspace disease. No pleural effusions. Diffuse bronchial wall thickening with moderate centrilobular and paraseptal emphysema. Musculoskeletal: There are no aggressive appearing lytic or blastic lesions noted in the visualized portions of the skeleton. CT ABDOMEN PELVIS FINDINGS Hepatobiliary: No suspicious cystic or solid hepatic lesions. Mild intrahepatic biliary ductal dilatation, similar to the prior study. Small amount of pneumobilia. Common bile duct stent in position with tip extending into the duodenum. Gallbladder is unremarkable in appearance.  Pancreas: Previously noted hypovascular lesion in the head and uncinate process of the pancreas appears slightly larger than the prior study measuring 3.2 x 3.3 x 4.1 cm (axial image 76 of series 2 and sagittal image 78 of series 7). This lesion is intimately associated with the distal aspect of the common bile duct (stented). The lesion comes in contact with the superior mesenteric artery encasing the vessel from approximately 5:00 to 1:00 along its right margin. Lesion also comes in contact with the distal aspect of the superior mesenteric vein, partially obliterating the intervening fat plane. The undersurface of the lesion comes in contact with the splenic vein and splenoportal confluence. The posterior aspect of the lesion also comes in broad contact with the IVC and infrarenal abdominal aorta, obliterating the intervening fat planes. This is associated with some pancreatic ductal ectasia up to 3 mm in the body of the pancreas. Body and tail of the pancreas are otherwise unremarkable in appearance. Spleen: Normal in size and unremarkable in appearance. Adrenals/Urinary Tract: Bilateral kidneys and bilateral adrenal glands are normal in appearance. No hydroureteronephrosis. Urinary bladder is  normal in appearance. Stomach/Bowel: Normal appearance of the stomach. No pathologic dilatation of small bowel or colon. Numerous colonic diverticulae are noted, particularly in the sigmoid colon, without surrounding inflammatory changes to suggest an acute diverticulitis at this time. The appendix is not confidently identified and may be surgically absent. Regardless, there are no inflammatory changes noted adjacent to the cecum to suggest the presence of an acute appendicitis at this time. Vascular/Lymphatic: Aortic atherosclerosis, without evidence of aneurysm or dissection in the abdominal or pelvic vasculature. Borderline enlarged portacaval lymph node measuring 1.2 cm in short axis, stable compared to the prior  examination. No other definite pathologically enlarged lymph nodes are noted elsewhere in the abdomen or pelvis. Reproductive: Prostate gland and seminal vesicles are unremarkable in appearance. Other: No significant volume of ascites.  No pneumoperitoneum. Musculoskeletal: There are no aggressive appearing lytic or blastic lesions noted in the visualized portions of the skeleton. IMPRESSION: 1. Mild enlargement of the lesion in the pancreatic head, indicative of slight progression of disease. The lesion currently measures 3.2 x 3.3 x 4.1 cm. 2. Stable borderline enlarged portacaval lymph node measuring 1.2 cm in short axis. 3. Stable small pulmonary nodules and areas of scarring in the lungs, as detailed above. Continued attention on any future routine follow-up studies is recommended. 4. Aortic atherosclerosis. 5. Colonic diverticulosis without evidence to suggest an acute diverticulitis at this time. 6. Additional incidental findings, similar to prior studies, as above. Electronically Signed   By: Vinnie Langton M.D.   On: 04/03/2018 10:09   Ct Abdomen Pelvis W Contrast  Result Date: 04/03/2018 CLINICAL DATA:  58 year old male with history of pancreatic cancer originally diagnosed in July 2018, treated with chemotherapy. Follow-up study. EXAM: CT CHEST, ABDOMEN, AND PELVIS WITH CONTRAST TECHNIQUE: Multidetector CT imaging of the chest, abdomen and pelvis was performed following the standard protocol during bolus administration of intravenous contrast. CONTRAST:  62mL ISOVUE-300 IOPAMIDOL (ISOVUE-300) INJECTION 61% COMPARISON:  CT the chest, abdomen and pelvis 12/02/2017. FINDINGS: CT CHEST FINDINGS Cardiovascular: Heart size is normal. There is no significant pericardial fluid, thickening or pericardial calcification. Aortic atherosclerosis. No definite coronary artery calcifications. Right internal jugular single-lumen porta cath with tip terminating in the right atrium. Mediastinum/Nodes: No pathologically  enlarged mediastinal or hilar lymph nodes. Esophagus is unremarkable in appearance. No axillary lymphadenopathy. Lungs/Pleura: 3 mm nodule in the anterior aspect of the right lower lobe (axial image 110 of series 4), decreased slightly compared to the prior study. 5 mm nodule in the medial aspect of the left lower lobe (axial image 130 of series 4), stable compared to the prior examination. There continues to be areas of chronic mucoid impaction within left lower lobe bronchi with some associated surrounding scarring. Scarring is most evident in the superior segment of the left lower lobe where there is a pleural-based area of architectural distortion measuring 7 x 14 mm (axial image 71 of series 4), stable compared to prior examinations (including prior PET-CT 04/12/2017, at which time this area was not hypermetabolic). No other new suspicious appearing pulmonary nodules or masses are noted. No acute consolidative airspace disease. No pleural effusions. Diffuse bronchial wall thickening with moderate centrilobular and paraseptal emphysema. Musculoskeletal: There are no aggressive appearing lytic or blastic lesions noted in the visualized portions of the skeleton. CT ABDOMEN PELVIS FINDINGS Hepatobiliary: No suspicious cystic or solid hepatic lesions. Mild intrahepatic biliary ductal dilatation, similar to the prior study. Small amount of pneumobilia. Common bile duct stent in position with tip extending into the  duodenum. Gallbladder is unremarkable in appearance. Pancreas: Previously noted hypovascular lesion in the head and uncinate process of the pancreas appears slightly larger than the prior study measuring 3.2 x 3.3 x 4.1 cm (axial image 76 of series 2 and sagittal image 78 of series 7). This lesion is intimately associated with the distal aspect of the common bile duct (stented). The lesion comes in contact with the superior mesenteric artery encasing the vessel from approximately 5:00 to 1:00 along its  right margin. Lesion also comes in contact with the distal aspect of the superior mesenteric vein, partially obliterating the intervening fat plane. The undersurface of the lesion comes in contact with the splenic vein and splenoportal confluence. The posterior aspect of the lesion also comes in broad contact with the IVC and infrarenal abdominal aorta, obliterating the intervening fat planes. This is associated with some pancreatic ductal ectasia up to 3 mm in the body of the pancreas. Body and tail of the pancreas are otherwise unremarkable in appearance. Spleen: Normal in size and unremarkable in appearance. Adrenals/Urinary Tract: Bilateral kidneys and bilateral adrenal glands are normal in appearance. No hydroureteronephrosis. Urinary bladder is normal in appearance. Stomach/Bowel: Normal appearance of the stomach. No pathologic dilatation of small bowel or colon. Numerous colonic diverticulae are noted, particularly in the sigmoid colon, without surrounding inflammatory changes to suggest an acute diverticulitis at this time. The appendix is not confidently identified and may be surgically absent. Regardless, there are no inflammatory changes noted adjacent to the cecum to suggest the presence of an acute appendicitis at this time. Vascular/Lymphatic: Aortic atherosclerosis, without evidence of aneurysm or dissection in the abdominal or pelvic vasculature. Borderline enlarged portacaval lymph node measuring 1.2 cm in short axis, stable compared to the prior examination. No other definite pathologically enlarged lymph nodes are noted elsewhere in the abdomen or pelvis. Reproductive: Prostate gland and seminal vesicles are unremarkable in appearance. Other: No significant volume of ascites.  No pneumoperitoneum. Musculoskeletal: There are no aggressive appearing lytic or blastic lesions noted in the visualized portions of the skeleton. IMPRESSION: 1. Mild enlargement of the lesion in the pancreatic head,  indicative of slight progression of disease. The lesion currently measures 3.2 x 3.3 x 4.1 cm. 2. Stable borderline enlarged portacaval lymph node measuring 1.2 cm in short axis. 3. Stable small pulmonary nodules and areas of scarring in the lungs, as detailed above. Continued attention on any future routine follow-up studies is recommended. 4. Aortic atherosclerosis. 5. Colonic diverticulosis without evidence to suggest an acute diverticulitis at this time. 6. Additional incidental findings, similar to prior studies, as above. Electronically Signed   By: Vinnie Langton M.D.   On: 04/03/2018 10:09    ASSESSMENT: Stage IV pancreatic cancer  PLAN:   1. Stage IV pancreatic cancer: Imaging, pathology, and Op note reviewed independently confirming stage IV disease with multiple peritoneal implants.  CT scan results from April 03, 2018 reviewed independently and report as above with mild progression of disease.  His CA 19-9 is also increasing and is now 762.  Despite this, will proceed with cycle 12 of CAPEOX today.  He will receive oxaliplatin today and 14 days of oral capecitabine.  Patient will then have 7 days off.  Can always reconsider modified FOLFIRINOX (oxaliplatin 85 mg/m, leucovorin 400 mg/m, irinotecan 150 mg/m, 5-FU 2400 mg/m over 46 hours) every 2 weeks in the future.  Will get a PET scan for further evaluation.  If patient has no obvious evidence of disease outside his pancreas,  can consider referral to radiation oncology for SBRT.  Return to clinic in 3 weeks for further evaluation and additional treatment planning. 2.  Elevated liver enzymes: AST and ALT are trending up.  Monitor and proceed with treatment as above. Patient has not been evaluated by GI secondary to transportation issues. 3. Hyponatremia: Chronic and relatively unchanged.  Patient sodium is 132 today. 4. Peripheral edema: Continue elevation nightly.  Lower extremity ultrasound did not reveal DVT.   5.  Elevated bilirubin:  Total bilirubin is 1.5 today.  Monitor. 6.  Pain: Slightly worse today.  Continue oxycodone at night and patient was given a prescription for tramadol for daytime use. 7.  Poor appetite: Patient has Megace prescribed, but is unclear his compliance.   8.  Shortness of breath: Chronic and unchanged.  Patient does not complain of this today.  Continue inhalers as prescribed. 9.  Shoulder pain/tremor: Patient was previously given a referral to orthopedics as he is received injections in the past that improved his symptoms.  Patient expressed understanding and was in agreement with this plan. He also understands that He can call clinic at any time with any questions, concerns, or complaints.   Cancer Staging Pancreatic cancer [facility]) Staging form: Exocrine Pancreas, AJCC 8th Edition - Clinical stage from 09/25/2016: Stage IV (cTX, cNX, pM1) - Signed by Lloyd Huger, MD on 09/25/2016   Lloyd Huger, MD   04/23/2018 11:17 PM

## 2018-04-24 ENCOUNTER — Ambulatory Visit: Payer: Medicaid Other

## 2018-04-25 ENCOUNTER — Other Ambulatory Visit: Payer: Self-pay | Admitting: Oncology

## 2018-04-27 ENCOUNTER — Inpatient Hospital Stay: Payer: Medicaid Other | Admitting: Oncology

## 2018-04-27 ENCOUNTER — Inpatient Hospital Stay: Payer: Medicaid Other

## 2018-04-28 MED FILL — XELODA 500 MG TABLET: 500 | 21 days supply | Qty: 84 | Fill #1

## 2018-04-28 NOTE — Progress Notes (Deleted)
[name]  [phone] [phone]  ID: Benjamin Diaz. OB: 1961/02/04  MR#: [id number]  QIH#:[id number]  Patient Care Team: Patient, [name] as PCP - General (General Practice) [name], MD as Consulting Physician (Oncology) [name], MD (Inactive) as Consulting Physician (Gastroenterology) [name], MD as Consulting Physician (Gastroenterology)  CHIEF COMPLAINT: Stage IV pancreatic cancer  INTERVAL HISTORY: Patient returns to clinic today for further evaluation, discussion of his imaging results, and consideration of cycle 12 of CAPOX.  Patient continues to have increased abdominal and back pain, but otherwise feels well.  He has chronic weakness and fatigue that is unchanged. He has a poor appetite, but no weight loss. He has no other neurologic complaints. He denies any recent fevers or illnesses.  He denies any chest pain, shortness of breath, cough, or hemoptysis.  He denies any nausea, vomiting, constipation, or diarrhea. He has no urinary complaints.  Patient offers no further specific complaints today.  REVIEW OF SYSTEMS:   Review of Systems  Constitutional: Positive for malaise/fatigue. Negative for fever and weight loss.  Respiratory: Negative.  Negative for cough and shortness of breath.   Cardiovascular: Negative.  Negative for chest pain and leg swelling.  Gastrointestinal: Positive for abdominal pain. Negative for diarrhea, nausea and vomiting.  Genitourinary: Positive for flank pain. Negative for dysuria, frequency and urgency.  Musculoskeletal: Positive for back pain and joint pain.  Skin: Negative.  Negative for itching and rash.  Neurological: Positive for tingling, tremors, sensory change and weakness. Negative for focal weakness.  Psychiatric/Behavioral: Negative.  The patient is not nervous/anxious.     As per HPI. Otherwise, a complete review of systems is negative.  PAST MEDICAL HISTORY: Past  Medical History:  Diagnosis Date  . Arthritis   . Asthma   . Atrial fibrillation ([place])   . Closed left ankle fracture age 19  . COPD (chronic obstructive pulmonary disease) ([place])   . Dysrhythmia   . Pancreatic cancer ([place]) 06/2016   Chemo tx's.   . Pneumonia 2011  . Right shoulder injury 09/27/2016  . Sciatica    left leg pinched nerve numb at times about once a year    PAST SURGICAL HISTORY: Past Surgical History:  Procedure Laterality Date  . BILIARY STENT PLACEMENT N/A 09/01/2016   Procedure: BILIARY STENT PLACEMENT;  Surgeon: [name], MD;  Location: [facility];  Service: Endoscopy;  Laterality: N/A;  . DIAGNOSTIC LAPAROSCOPIC LIVER BIOPSY  09/16/2016   Procedure: DIAGNOSTIC LAPAROSCOPIC LIVER BIOPSY;  Surgeon: [name], MD;  Location: [facility];  Service: General;;  . ERCP N/A 07/09/2016   Procedure: ENDOSCOPIC RETROGRADE CHOLANGIOPANCREATOGRAPHY (ERCP);  Surgeon: [name], MD;  Location: [facility];  Service: Endoscopy;  Laterality: N/A;  . ERCP N/A 09/01/2016   Procedure: ENDOSCOPIC RETROGRADE CHOLANGIOPANCREATOGRAPHY (ERCP);  Surgeon: [name], MD;  Location: [facility];  Service: Endoscopy;  Laterality: N/A;  . ERCP N/A 06/28/2017   Procedure: ENDOSCOPIC RETROGRADE CHOLANGIOPANCREATOGRAPHY (ERCP);  Surgeon: [name], MD;  [facility];  Service: Endoscopy;  Laterality: N/A;  . ESOPHAGOGASTRODUODENOSCOPY (EGD) WITH PROPOFOL N/A 09/01/2016   Procedure: ESOPHAGOGASTRODUODENOSCOPY (EGD) WITH PROPOFOL;  Surgeon: [name], MD;  Location: [facility];  Service: Gastroenterology;  Laterality: N/A;  . EUS N/A 09/01/2016   Procedure: ESOPHAGEAL ENDOSCOPIC ULTRASOUND (EUS) RADIAL;  Surgeon: [name], MD;  Location: [facility];  Service: Endoscopy;  Laterality: N/A;  . FINE NEEDLE ASPIRATION N/A 09/01/2016   Procedure: FINE NEEDLE ASPIRATION (FNA) RADIAL;  Surgeon:  [name], MD;  Location: [facility];  Service: Endoscopy;  Laterality: N/A;    . FOREIGN BODY REMOVAL N/A 09/01/2016   Procedure: FOREIGN BODY REMOVAL;  Surgeon: [name], MD;  Location: [facility];  Service: Gastroenterology;  Laterality: N/A;  . KNEE SURGERY Right 2002   arthroscopy  . PORTA CATH INSERTION N/A 09/20/2017   Procedure: PORTA CATH INSERTION;  Surgeon: [name], MD;  Location: [facility];  Service: Cardiovascular;  Laterality: N/A;  . SHOULDER SURGERY Right 2002    FAMILY HISTORY: Family History  Problem Relation Age of Onset  . Heart disease Father     ADVANCED DIRECTIVES (Y/N):  N  HEALTH MAINTENANCE: Social History   Tobacco Use  . Smoking status: Current Every Day Smoker    Packs/day: 1.00    Years: 40.00    Pack years: 40.00    Types: Cigarettes  . Smokeless tobacco: Never Used  Substance Use Topics  . Alcohol use: Yes    Alcohol/week: 7.0 standard drinks    Types: 7 Cans of beer per week    Comment: last drink 5 weeeks ago  april 2018  . Drug use: No     Colonoscopy:  PAP:  Bone density:  Lipid panel:  Allergies  Allergen Reactions  . No Known Allergies     Current Outpatient Medications  Medication Sig Dispense Refill  . acetaminophen (TYLENOL) 500 MG tablet Take 1,000 mg by mouth every 8 (eight) hours as needed.    Marland Kitchen albuterol (PROVENTIL HFA;VENTOLIN HFA) 108 (90 Base) MCG/ACT inhaler Inhale 1-2 puffs into the lungs every 6 (six) hours as needed for wheezing or shortness of breath. 1 Inhaler 6  . Ipratropium-Albuterol (COMBIVENT RESPIMAT) 20-100 MCG/ACT AERS respimat Inhale 1 puff into the lungs 4 (four) times daily. 1 Inhaler 6  . megestrol (MEGACE) 40 MG tablet Take 1 tablet (40 mg total) by mouth daily. 30 tablet 2  . Oxycodone HCl 10 MG TABS Take 1 tablet (10 mg total) by mouth 2 (two) times daily as needed. 60 tablet 0  . traMADol (ULTRAM) 50 MG tablet Take 1 tablet (50 mg total) by mouth every 6 (six) hours as needed. 30 tablet 0  . XELODA 500 MG tablet TAKE 3 TABLETS (1,500 MG TOTAL) BY  MOUTH 2 (TWO) TIMES DAILY AFTER A MEAL. TAKE FOR 14 DAYS ON, THEN 7 DAYS OFF. 84 tablet 2   No current facility-administered medications for this visit.    OBJECTIVE: There were no vitals filed for this visit.   There is no height or weight on file to calculate BMI.    ECOG FS:1 - Symptomatic but completely ambulatory  General: Thin, no acute distress. Eyes: Pink conjunctiva, anicteric sclera. HEENT: Normocephalic, moist mucous membranes, clear oropharnyx. Lungs: Clear to auscultation bilaterally. Heart: Regular rate and rhythm. No rubs, murmurs, or gallops. Abdomen: Soft, nontender, nondistended. No organomegaly noted, normoactive bowel sounds. Musculoskeletal: No edema, cyanosis, or clubbing. Neuro: Alert, answering all questions appropriately. Cranial nerves grossly intact. Skin: No rashes or petechiae noted. Psych: Normal affect.  LAB RESULTS:  Lab Results  Component Value Date   NA 132 (L) 04/06/2018   K 3.9 04/06/2018   CL 97 (L) 04/06/2018   CO2 26 04/06/2018   GLUCOSE 107 (H) 04/06/2018   BUN 8 04/06/2018   CREATININE 0.37 (L) 04/06/2018   CALCIUM 9.0 04/06/2018   PROT 7.1 04/06/2018   ALBUMIN 3.6 04/06/2018   AST 174 (H) 04/06/2018   ALT 166 (H)  04/06/2018   ALKPHOS 977 (H) 04/06/2018   BILITOT 1.5 (H) 04/06/2018   GFRNONAA >60 04/06/2018   GFRAA >60 04/06/2018    Lab Results  Component Value Date   WBC 6.8 04/06/2018   NEUTROABS 3.9 04/06/2018   HGB 14.8 04/06/2018   HCT 42.6 04/06/2018   MCV 95.5 04/06/2018   PLT 212 04/06/2018      STUDIES: Ct Chest W Contrast  Result Date: 04/03/2018 CLINICAL DATA:  58 year old male with history of pancreatic cancer originally diagnosed in July 2018, treated with chemotherapy. Follow-up study. EXAM: CT CHEST, ABDOMEN, AND PELVIS WITH CONTRAST TECHNIQUE: Multidetector CT imaging of the chest, abdomen and pelvis was performed following the standard protocol during bolus administration of intravenous contrast. CONTRAST:   40mL ISOVUE-300 IOPAMIDOL (ISOVUE-300) INJECTION 61% COMPARISON:  CT the chest, abdomen and pelvis 12/02/2017. FINDINGS: CT CHEST FINDINGS Cardiovascular: Heart size is normal. There is no significant pericardial fluid, thickening or pericardial calcification. Aortic atherosclerosis. No definite coronary artery calcifications. Right internal jugular single-lumen porta cath with tip terminating in the right atrium. Mediastinum/Nodes: No pathologically enlarged mediastinal or hilar lymph nodes. Esophagus is unremarkable in appearance. No axillary lymphadenopathy. Lungs/Pleura: 3 mm nodule in the anterior aspect of the right lower lobe (axial image 110 of series 4), decreased slightly compared to the prior study. 5 mm nodule in the medial aspect of the left lower lobe (axial image 130 of series 4), stable compared to the prior examination. There continues to be areas of chronic mucoid impaction within left lower lobe bronchi with some associated surrounding scarring. Scarring is most evident in the superior segment of the left lower lobe where there is a pleural-based area of architectural distortion measuring 7 x 14 mm (axial image 71 of series 4), stable compared to prior examinations (including prior PET-CT 04/12/2017, at which time this area was not hypermetabolic). No other new suspicious appearing pulmonary nodules or masses are noted. No acute consolidative airspace disease. No pleural effusions. Diffuse bronchial wall thickening with moderate centrilobular and paraseptal emphysema. Musculoskeletal: There are no aggressive appearing lytic or blastic lesions noted in the visualized portions of the skeleton. CT ABDOMEN PELVIS FINDINGS Hepatobiliary: No suspicious cystic or solid hepatic lesions. Mild intrahepatic biliary ductal dilatation, similar to the prior study. Small amount of pneumobilia. Common bile duct stent in position with tip extending into the duodenum. Gallbladder is unremarkable in appearance.  Pancreas: Previously noted hypovascular lesion in the head and uncinate process of the pancreas appears slightly larger than the prior study measuring 3.2 x 3.3 x 4.1 cm (axial image 76 of series 2 and sagittal image 78 of series 7). This lesion is intimately associated with the distal aspect of the common bile duct (stented). The lesion comes in contact with the superior mesenteric artery encasing the vessel from approximately 5:00 to 1:00 along its right margin. Lesion also comes in contact with the distal aspect of the superior mesenteric vein, partially obliterating the intervening fat plane. The undersurface of the lesion comes in contact with the splenic vein and splenoportal confluence. The posterior aspect of the lesion also comes in broad contact with the IVC and infrarenal abdominal aorta, obliterating the intervening fat planes. This is associated with some pancreatic ductal ectasia up to 3 mm in the body of the pancreas. Body and tail of the pancreas are otherwise unremarkable in appearance. Spleen: Normal in size and unremarkable in appearance. Adrenals/Urinary Tract: Bilateral kidneys and bilateral adrenal glands are normal in appearance. No hydroureteronephrosis. Urinary bladder is  normal in appearance. Stomach/Bowel: Normal appearance of the stomach. No pathologic dilatation of small bowel or colon. Numerous colonic diverticulae are noted, particularly in the sigmoid colon, without surrounding inflammatory changes to suggest an acute diverticulitis at this time. The appendix is not confidently identified and may be surgically absent. Regardless, there are no inflammatory changes noted adjacent to the cecum to suggest the presence of an acute appendicitis at this time. Vascular/Lymphatic: Aortic atherosclerosis, without evidence of aneurysm or dissection in the abdominal or pelvic vasculature. Borderline enlarged portacaval lymph node measuring 1.2 cm in short axis, stable compared to the prior  examination. No other definite pathologically enlarged lymph nodes are noted elsewhere in the abdomen or pelvis. Reproductive: Prostate gland and seminal vesicles are unremarkable in appearance. Other: No significant volume of ascites.  No pneumoperitoneum. Musculoskeletal: There are no aggressive appearing lytic or blastic lesions noted in the visualized portions of the skeleton. IMPRESSION: 1. Mild enlargement of the lesion in the pancreatic head, indicative of slight progression of disease. The lesion currently measures 3.2 x 3.3 x 4.1 cm. 2. Stable borderline enlarged portacaval lymph node measuring 1.2 cm in short axis. 3. Stable small pulmonary nodules and areas of scarring in the lungs, as detailed above. Continued attention on any future routine follow-up studies is recommended. 4. Aortic atherosclerosis. 5. Colonic diverticulosis without evidence to suggest an acute diverticulitis at this time. 6. Additional incidental findings, similar to prior studies, as above. Electronically Signed   By: Vinnie Langton M.D.   On: 04/03/2018 10:09   Ct Abdomen Pelvis W Contrast  Result Date: 04/03/2018 CLINICAL DATA:  59 year old male with history of pancreatic cancer originally diagnosed in July 2018, treated with chemotherapy. Follow-up study. EXAM: CT CHEST, ABDOMEN, AND PELVIS WITH CONTRAST TECHNIQUE: Multidetector CT imaging of the chest, abdomen and pelvis was performed following the standard protocol during bolus administration of intravenous contrast. CONTRAST:  82mL ISOVUE-300 IOPAMIDOL (ISOVUE-300) INJECTION 61% COMPARISON:  CT the chest, abdomen and pelvis 12/02/2017. FINDINGS: CT CHEST FINDINGS Cardiovascular: Heart size is normal. There is no significant pericardial fluid, thickening or pericardial calcification. Aortic atherosclerosis. No definite coronary artery calcifications. Right internal jugular single-lumen porta cath with tip terminating in the right atrium. Mediastinum/Nodes: No pathologically  enlarged mediastinal or hilar lymph nodes. Esophagus is unremarkable in appearance. No axillary lymphadenopathy. Lungs/Pleura: 3 mm nodule in the anterior aspect of the right lower lobe (axial image 110 of series 4), decreased slightly compared to the prior study. 5 mm nodule in the medial aspect of the left lower lobe (axial image 130 of series 4), stable compared to the prior examination. There continues to be areas of chronic mucoid impaction within left lower lobe bronchi with some associated surrounding scarring. Scarring is most evident in the superior segment of the left lower lobe where there is a pleural-based area of architectural distortion measuring 7 x 14 mm (axial image 71 of series 4), stable compared to prior examinations (including prior PET-CT 04/12/2017, at which time this area was not hypermetabolic). No other new suspicious appearing pulmonary nodules or masses are noted. No acute consolidative airspace disease. No pleural effusions. Diffuse bronchial wall thickening with moderate centrilobular and paraseptal emphysema. Musculoskeletal: There are no aggressive appearing lytic or blastic lesions noted in the visualized portions of the skeleton. CT ABDOMEN PELVIS FINDINGS Hepatobiliary: No suspicious cystic or solid hepatic lesions. Mild intrahepatic biliary ductal dilatation, similar to the prior study. Small amount of pneumobilia. Common bile duct stent in position with tip extending into the  duodenum. Gallbladder is unremarkable in appearance. Pancreas: Previously noted hypovascular lesion in the head and uncinate process of the pancreas appears slightly larger than the prior study measuring 3.2 x 3.3 x 4.1 cm (axial image 76 of series 2 and sagittal image 78 of series 7). This lesion is intimately associated with the distal aspect of the common bile duct (stented). The lesion comes in contact with the superior mesenteric artery encasing the vessel from approximately 5:00 to 1:00 along its  right margin. Lesion also comes in contact with the distal aspect of the superior mesenteric vein, partially obliterating the intervening fat plane. The undersurface of the lesion comes in contact with the splenic vein and splenoportal confluence. The posterior aspect of the lesion also comes in broad contact with the IVC and infrarenal abdominal aorta, obliterating the intervening fat planes. This is associated with some pancreatic ductal ectasia up to 3 mm in the body of the pancreas. Body and tail of the pancreas are otherwise unremarkable in appearance. Spleen: Normal in size and unremarkable in appearance. Adrenals/Urinary Tract: Bilateral kidneys and bilateral adrenal glands are normal in appearance. No hydroureteronephrosis. Urinary bladder is normal in appearance. Stomach/Bowel: Normal appearance of the stomach. No pathologic dilatation of small bowel or colon. Numerous colonic diverticulae are noted, particularly in the sigmoid colon, without surrounding inflammatory changes to suggest an acute diverticulitis at this time. The appendix is not confidently identified and may be surgically absent. Regardless, there are no inflammatory changes noted adjacent to the cecum to suggest the presence of an acute appendicitis at this time. Vascular/Lymphatic: Aortic atherosclerosis, without evidence of aneurysm or dissection in the abdominal or pelvic vasculature. Borderline enlarged portacaval lymph node measuring 1.2 cm in short axis, stable compared to the prior examination. No other definite pathologically enlarged lymph nodes are noted elsewhere in the abdomen or pelvis. Reproductive: Prostate gland and seminal vesicles are unremarkable in appearance. Other: No significant volume of ascites.  No pneumoperitoneum. Musculoskeletal: There are no aggressive appearing lytic or blastic lesions noted in the visualized portions of the skeleton. IMPRESSION: 1. Mild enlargement of the lesion in the pancreatic head,  indicative of slight progression of disease. The lesion currently measures 3.2 x 3.3 x 4.1 cm. 2. Stable borderline enlarged portacaval lymph node measuring 1.2 cm in short axis. 3. Stable small pulmonary nodules and areas of scarring in the lungs, as detailed above. Continued attention on any future routine follow-up studies is recommended. 4. Aortic atherosclerosis. 5. Colonic diverticulosis without evidence to suggest an acute diverticulitis at this time. 6. Additional incidental findings, similar to prior studies, as above. Electronically Signed   By: Vinnie Langton M.D.   On: 04/03/2018 10:09    ASSESSMENT: Stage IV pancreatic cancer  PLAN:   1. Stage IV pancreatic cancer: Imaging, pathology, and Op note reviewed independently confirming stage IV disease with multiple peritoneal implants.  CT scan results from April 03, 2018 reviewed independently and report as above with mild progression of disease.  His CA 19-9 is also increasing and is now 762.  Despite this, will proceed with cycle 12 of CAPEOX today.  He will receive oxaliplatin today and 14 days of oral capecitabine.  Patient will then have 7 days off.  Can always reconsider modified FOLFIRINOX (oxaliplatin 85 mg/m, leucovorin 400 mg/m, irinotecan 150 mg/m, 5-FU 2400 mg/m over 46 hours) every 2 weeks in the future.  Will get a PET scan for further evaluation.  If patient has no obvious evidence of disease outside his pancreas,  can consider referral to radiation oncology for SBRT.  Return to clinic in 3 weeks for further evaluation and additional treatment planning. 2.  Elevated liver enzymes: AST and ALT are trending up.  Monitor and proceed with treatment as above. Patient has not been evaluated by GI secondary to transportation issues. 3. Hyponatremia: Chronic and relatively unchanged.  Patient sodium is 132 today. 4. Peripheral edema: Continue elevation nightly.  Lower extremity ultrasound did not reveal DVT.   5.  Elevated bilirubin:  Total bilirubin is 1.5 today.  Monitor. 6.  Pain: Slightly worse today.  Continue oxycodone at night and patient was given a prescription for tramadol for daytime use. 7.  Poor appetite: Patient has Megace prescribed, but is unclear his compliance.   8.  Shortness of breath: Chronic and unchanged.  Patient does not complain of this today.  Continue inhalers as prescribed. 9.  Shoulder pain/tremor: Patient was previously given a referral to orthopedics as he is received injections in the past that improved his symptoms.  Patient expressed understanding and was in agreement with this plan. He also understands that He can call clinic at any time with any questions, concerns, or complaints.   Cancer Staging Pancreatic cancer [facility]) Staging form: Exocrine Pancreas, AJCC 8th Edition - Clinical stage from 09/25/2016: Stage IV (cTX, cNX, pM1) - Signed by Lloyd Huger, MD on 09/25/2016   Lloyd Huger, MD   04/28/2018 11:42 AM

## 2018-05-01 ENCOUNTER — Ambulatory Visit: Payer: Medicaid Other

## 2018-05-03 ENCOUNTER — Other Ambulatory Visit: Payer: Self-pay | Admitting: *Deleted

## 2018-05-03 ENCOUNTER — Telehealth: Payer: Self-pay

## 2018-05-03 NOTE — Telephone Encounter (Addendum)
Patient reports pain and no way to pay for his medications  Even with a refill auth from physician as his insurance has lapsed. Call in for [facility] to see if medicine can be covered on Fund. Awaiting return call

## 2018-05-03 NOTE — Telephone Encounter (Signed)
Patient called and left [place] a voicemail letting us know that he no longer had Medicaid. He stated that he had an appointment with Dr. Grayland Ormond tomorrow (05/04/2018) and a PET Scan on 05/05/2018) and that he will have to cancel them. He also stated that he had been contacting his medicaid Education officer, [place] but has not been able to speak with her.  I called on of our Patient financial representative to ask what could be done. She stated that she had just spoken to the patient and recommended for him to continue to call her until he is able to speak with her to fix this problem. Since patient is wanting to cancel his PET Scan and appointment with Dr. Grayland Ormond due to not having insurance, I will let Dr. Grayland Ormond know.

## 2018-05-04 ENCOUNTER — Inpatient Hospital Stay: Payer: Self-pay

## 2018-05-04 ENCOUNTER — Inpatient Hospital Stay: Payer: Self-pay | Admitting: Oncology

## 2018-05-04 MED ORDER — OXYCODONE HCL 10 MG PO TABS: 10.0000 mg | ORAL_TABLET | Freq: Two times a day (BID) | ORAL | 0 refills | Status: DC | PRN

## 2018-05-04 NOTE — Telephone Encounter (Signed)
Patient called cancer center requesting refill of oxycodone 10 mg BID PRN for pain.   As mandated by the [place] STOP Act (Strengthen Opioid Misuse Prevention), the [place] Controlled Substance Reporting System ([place]) was reviewed for this patient.  Below is the past 48-months of controlled substance prescriptions as displayed by the registry.  I have personally consulted with my supervising physician, Dr. Rogue Bussing, who agrees that continuation of opiate therapy is medically appropriate at this time and agrees to provide continual monitoring, including urine/blood drug screens, as indicated. Refill is appropriate on or after 04/14/18.   NCCSRS reviewed:     Faythe Casa, NP 05/04/2018 2:13 PM [phone]

## 2018-05-04 NOTE — Telephone Encounter (Signed)
Please send prescription, or have patient take to [facility] or [facility].  Please tell pharmacy to put on byrd fund. Thanks jack Patient choose [facility]. Prescription sent to doctor for signature

## 2018-05-04 NOTE — Telephone Encounter (Signed)
Patient stated that he was able to speak with his social worker from [place] and that she was going to work on it today so to not cancel any of his appointments.

## 2018-05-05 ENCOUNTER — Ambulatory Visit
Admission: RE | Admit: 2018-05-05 | Discharge: 2018-05-05 | Disposition: A | Discharge status: Home / Self Care | Payer: Medicaid Other | Source: Ambulatory Visit | Attending: Oncology | Admitting: Oncology

## 2018-05-05 DIAGNOSIS — C259 Malignant neoplasm of pancreas, unspecified: Secondary | ICD-10-CM

## 2018-05-05 DIAGNOSIS — J439 Emphysema, unspecified: Secondary | ICD-10-CM | POA: Insufficient documentation

## 2018-05-05 LAB — GLUCOSE, CAPILLARY: Glucose-Capillary: 89 mg/dL (ref 70–99)

## 2018-05-05 MED ORDER — FLUDEOXYGLUCOSE F - 18 (FDG) INJECTION
6.4900 | Freq: Once | INTRAVENOUS | Status: AC | PRN
  Administered 2018-05-05: 6.49 via INTRAVENOUS

## 2018-05-05 NOTE — Progress Notes (Signed)
[place]  [phone] [phone]  ID: Benjamin Diaz. OB: 08-30-1960  MR#: [id number]  UUV#:[id number]  Patient Care Team: Patient, [name] as PCP - General (General Practice) [name], MD as Consulting Physician (Oncology) [name], MD (Inactive) as Consulting Physician (Gastroenterology) [name], MD as Consulting Physician (Gastroenterology)  CHIEF COMPLAINT: Stage IV pancreatic cancer  INTERVAL HISTORY: Patient returns to clinic today for further evaluation and discussion of his imaging results.  He continues to have chronic abdominal pain, but otherwise feels well.  He has chronic weakness and fatigue that is unchanged. He has a poor appetite, but no weight loss.  He has no neurologic complaints.  He denies any recent fevers or illnesses.  He denies any chest pain, shortness of breath, cough, or hemoptysis.  He denies any nausea, vomiting, constipation, or diarrhea. He has no urinary complaints.  Patient offers no further specific complaints today.  REVIEW OF SYSTEMS:   Review of Systems  Constitutional: Positive for malaise/fatigue. Negative for fever and weight loss.  Respiratory: Negative.  Negative for cough and shortness of breath.   Cardiovascular: Negative.  Negative for chest pain and leg swelling.  Gastrointestinal: Positive for abdominal pain. Negative for diarrhea, nausea and vomiting.  Genitourinary: Negative for dysuria, flank pain, frequency and urgency.  Musculoskeletal: Negative for back pain and joint pain.  Skin: Negative.  Negative for itching and rash.  Neurological: Positive for tingling, sensory change and weakness. Negative for tremors and focal weakness.  Psychiatric/Behavioral: Negative.  The patient is not nervous/anxious.     As per HPI. Otherwise, a complete review of systems is negative.  PAST MEDICAL HISTORY: Past Medical History:  Diagnosis Date  . Arthritis   . Asthma   .  Atrial fibrillation ([place])   . Closed left ankle fracture age 79  . COPD (chronic obstructive pulmonary disease) ([place])   . Dysrhythmia   . Pancreatic cancer ([place]) 06/2016   Chemo tx's.   . Pneumonia 2011  . Right shoulder injury 09/27/2016  . Sciatica    left leg pinched nerve numb at times about once a year    PAST SURGICAL HISTORY: Past Surgical History:  Procedure Laterality Date  . BILIARY STENT PLACEMENT N/A 09/01/2016   Procedure: BILIARY STENT PLACEMENT;  Surgeon: [name], MD;  Location: [facility];  Service: Endoscopy;  Laterality: N/A;  . DIAGNOSTIC LAPAROSCOPIC LIVER BIOPSY  09/16/2016   Procedure: DIAGNOSTIC LAPAROSCOPIC LIVER BIOPSY;  Surgeon: [name], MD;  Location: [facility];  Service: General;;  . ERCP N/A 07/09/2016   Procedure: ENDOSCOPIC RETROGRADE CHOLANGIOPANCREATOGRAPHY (ERCP);  Surgeon: [name], MD;  Location: [facility];  Service: Endoscopy;  Laterality: N/A;  . ERCP N/A 09/01/2016   Procedure: ENDOSCOPIC RETROGRADE CHOLANGIOPANCREATOGRAPHY (ERCP);  Surgeon: [name], MD;  Location: [facility];  Service: Endoscopy;  Laterality: N/A;  . ERCP N/A 06/28/2017   Procedure: ENDOSCOPIC RETROGRADE CHOLANGIOPANCREATOGRAPHY (ERCP);  Surgeon: [name], MD;  Location: [facility];  Service: Endoscopy;  Laterality: N/A;  . ESOPHAGOGASTRODUODENOSCOPY (EGD) WITH PROPOFOL N/A 09/01/2016   Procedure: ESOPHAGOGASTRODUODENOSCOPY (EGD) WITH PROPOFOL;  Surgeon: [name], MD;  Location: [facility];  Service: Gastroenterology;  Laterality: N/A;  . EUS N/A 09/01/2016   Procedure: ESOPHAGEAL ENDOSCOPIC ULTRASOUND (EUS) RADIAL;  Surgeon: [name], MD;  Location: [facility];  Service: Endoscopy;  Laterality: N/A;  . FINE NEEDLE ASPIRATION N/A 09/01/2016   Procedure: FINE NEEDLE ASPIRATION (FNA) RADIAL;  Surgeon: [name], MD;  Location: [facility];  Service: Endoscopy;  Laterality: N/A;  . FOREIGN BODY REMOVAL N/A 09/01/2016   Procedure: FOREIGN BODY  REMOVAL;  Surgeon: [name], MD;  Location: [facility];  Service: Gastroenterology;  Laterality: N/A;  . KNEE SURGERY Right 2002   arthroscopy  . PORTA CATH INSERTION N/A 09/20/2017   Procedure: PORTA CATH INSERTION;  Surgeon: [name], MD;  Location: [facility];  Service: Cardiovascular;  Laterality: N/A;  . SHOULDER SURGERY Right 2002    FAMILY HISTORY: Family History  Problem Relation Age of Onset  . Heart disease Father     ADVANCED DIRECTIVES (Y/N):  N  HEALTH MAINTENANCE: Social History   Tobacco Use  . Smoking status: Current Every Day Smoker    Packs/day: 1.00    Years: 40.00    Pack years: 40.00    Types: Cigarettes  . Smokeless tobacco: Never Used  Substance Use Topics  . Alcohol use: Yes    Alcohol/week: 7.0 standard drinks    Types: 7 Cans of beer per week    Comment: last drink 5 weeeks ago  april 2018  . Drug use: No     Colonoscopy:  PAP:  Bone density:  Lipid panel:  Allergies  Allergen Reactions  . No Known Allergies     Current Outpatient Medications  Medication Sig Dispense Refill  . acetaminophen (TYLENOL) 500 MG tablet Take 1,000 mg by mouth every 8 (eight) hours as needed.    Marland Kitchen albuterol (PROVENTIL HFA;VENTOLIN HFA) 108 (90 Base) MCG/ACT inhaler Inhale 1-2 puffs into the lungs every 6 (six) hours as needed for wheezing or shortness of breath. 1 Inhaler 6  . Oxycodone HCl 10 MG TABS Take 1 tablet (10 mg total) by mouth 2 (two) times daily as needed. 60 tablet 0  . traMADol (ULTRAM) 50 MG tablet Take 1 tablet (50 mg total) by mouth every 6 (six) hours as needed. 30 tablet 0  . XELODA 500 MG tablet TAKE 3 TABLETS (1,500 MG TOTAL) BY MOUTH 2 (TWO) TIMES DAILY AFTER A MEAL. TAKE FOR 14 DAYS ON, THEN 7 DAYS OFF. 84 tablet 2  . Ipratropium-Albuterol (COMBIVENT RESPIMAT) 20-100 MCG/ACT AERS respimat Inhale 1 puff into the lungs 4 (four) times daily. 1 Inhaler 6  . megestrol (MEGACE) 40 MG tablet Take 1 tablet (40 mg total) by  mouth daily. 30 tablet 2   No current facility-administered medications for this visit.    OBJECTIVE: Vitals:   05/09/18 0936  BP: 108/71  Pulse: 97  Resp: 18  Temp: 97.7 F (36.5 C)     Body mass index is 14.63 kg/m.    ECOG FS:1 - Symptomatic but completely ambulatory  General: Thin, no acute distress. Eyes: Pink conjunctiva, anicteric sclera. HEENT: Normocephalic, moist mucous membranes. Lungs: Clear to auscultation bilaterally. Heart: Regular rate and rhythm. No rubs, murmurs, or gallops. Abdomen: Soft, nontender, nondistended. No organomegaly noted, normoactive bowel sounds. Musculoskeletal: No edema, cyanosis, or clubbing. Neuro: Alert, answering all questions appropriately. Cranial nerves grossly intact. Skin: No rashes or petechiae noted. Psych: Normal affect.  LAB RESULTS:  Lab Results  Component Value Date   NA 129 (L) 05/09/2018   K 3.8 05/09/2018   CL 97 (L) 05/09/2018   CO2 26 05/09/2018   GLUCOSE 110 (H) 05/09/2018   BUN 8 05/09/2018   CREATININE 0.38 (L) 05/09/2018   CALCIUM 9.0 05/09/2018   PROT 7.0 05/09/2018   ALBUMIN 3.5 05/09/2018   AST 157 (H) 05/09/2018   ALT 141 (H) 05/09/2018  ALKPHOS 970 (H) 05/09/2018   BILITOT 1.2 05/09/2018   GFRNONAA >60 05/09/2018   GFRAA >60 05/09/2018    Lab Results  Component Value Date   WBC 6.7 05/09/2018   NEUTROABS 4.4 05/09/2018   HGB 15.2 05/09/2018   HCT 43.0 05/09/2018   MCV 96.6 05/09/2018   PLT 173 05/09/2018      STUDIES: Nm Pet Image Restag (ps) Skull Base To Thigh  Result Date: 05/05/2018 CLINICAL DATA:  Subsequent treatment strategy for pancreatic cancer. EXAM: NUCLEAR MEDICINE PET SKULL BASE TO THIGH TECHNIQUE: 6.5 mCi F-18 FDG was injected intravenously. Full-ring PET imaging was performed from the skull base to thigh after the radiotracer. CT data was obtained and used for attenuation correction and anatomic localization. Fasting blood glucose: 89 mg/dl COMPARISON:  Abdomen and pelvis CT  04/03/2018.  PET-CT 04/12/2017 FINDINGS: Mediastinal blood pool activity: SUV max 2.2 NECK: No hypermetabolic lymph nodes in the neck. Incidental CT findings: none CHEST: No hypermetabolic mediastinal or hilar nodes. No suspicious pulmonary nodules on the CT scan. Low level uptake in the distal esophagus likely physiologic. Incidental CT findings: Right Port-A-Cath tip is positioned in the upper right atrium. Centrilobular and paraseptal emphysema noted. Chronic airway impaction with scarring in the medial left lower lobe is similar to prior. Tiny pulmonary nodules identified on the recent diagnostic CT scan are not as well demonstrated on today's non breath hold CT scan acquired for attenuation correction. ABDOMEN/PELVIS: The patient's known pancreatic lesion is hypermetabolic with SUV max = 9.8 increased from 5.0 on previous PET-CT of 04/12/2017. as on prior PET imaging, the hypermetabolic activity encompasses the distal common bile duct stent. Focal hypermetabolic uptake is identified at the distal tip of the common duct stent, indeterminate. No evidence for hypermetabolic liver metastases. No hypermetabolic distant metastases in the abdomen or pelvis. Incidental CT findings: Intrahepatic biliary duct dilatation is similar to 04/03/2018. There is abdominal aortic atherosclerosis without aneurysm. SKELETON: No focal hypermetabolic activity to suggest skeletal metastasis. Incidental CT findings: No worrisome lytic or sclerotic osseous abnormality. IMPRESSION: 1. Hypermetabolism associated with the known pancreatic head mass has progressed in the interval since prior PET-CT. 2. No hypermetabolic liver metastases evident on today's exam. No hypermetabolic distant metastases identified. 3.  Emphysema. (ICD10-J43.9) 4.  Aortic Atherosclerois (ICD10-170.0) Electronically Signed   By: Misty Stanley M.D.   On: 05/05/2018 14:13    ASSESSMENT: Stage IV pancreatic cancer  PLAN:   1. Stage IV pancreatic cancer:  Imaging, pathology, and Op note reviewed independently confirming stage IV disease with multiple peritoneal implants.  PET scan results from May 05, 2018 reviewed independently and reported as above with clear progression of disease.  Patient CA-19-9 is also significantly increased at 1176.  Patient wishes to continue treatment, but expressed understanding that his treatment options are limited.  Discontinue CAPEOX.  Previously, patient only received 1 dose of gemcitabine and developed rash therefore was discontinued.  Patient return to clinic next week to rechallenge with gemcitabine using heavy premedications.  Plan on giving treatment on days 1, 8, and 15 with day 22 off.  Return to clinic in 1 week to initiate cycle 1, day 1 of gemcitabine. 2.  Elevated liver enzymes: AST and ALT are increased, but trending down from several weeks ago.  Patient has not been evaluated by GI secondary to transportation issues. 3. Hyponatremia: Chronic and relatively unchanged.  Patient sodium is 129 today. 4. Peripheral edema: Continue elevation nightly.  Lower extremity ultrasound did not reveal DVT.  5.  Elevated bilirubin: Resolved.  Total bilirubin is 1.2.  Monitor. 6.  Pain: Chronic and unchanged.  Continue oxycodone at night and patient was given a prescription for tramadol for daytime use. 7.  Poor appetite: Patient has Megace prescribed, but is unclear his compliance.   8.  Shortness of breath: Chronic and unchanged.  Patient does not complain of this today.  Continue inhalers as prescribed. 9.  Shoulder pain/tremor: Patient was previously given a referral to orthopedics as he is received injections in the past that improved his symptoms.  Patient expressed understanding and was in agreement with this plan. He also understands that He can call clinic at any time with any questions, concerns, or complaints.   Cancer Staging Pancreatic cancer [facility]) Staging form: Exocrine Pancreas, AJCC 8th Edition -  Clinical stage from 09/25/2016: Stage IV (cTX, cNX, pM1) - Signed by Lloyd Huger, MD on 09/25/2016   Lloyd Huger, MD   05/11/2018 6:42 AM

## 2018-05-09 ENCOUNTER — Inpatient Hospital Stay (HOSPITAL_BASED_OUTPATIENT_CLINIC_OR_DEPARTMENT_OTHER): Payer: Medicaid Other | Admitting: Oncology

## 2018-05-09 ENCOUNTER — Inpatient Hospital Stay: Payer: Medicaid Other | Attending: Oncology

## 2018-05-09 ENCOUNTER — Inpatient Hospital Stay: Payer: Medicaid Other

## 2018-05-09 ENCOUNTER — Encounter: Payer: Self-pay | Admitting: Oncology

## 2018-05-09 ENCOUNTER — Other Ambulatory Visit: Payer: Self-pay | Admitting: *Deleted

## 2018-05-09 VITALS — BP 108/71 | HR 97 | Temp 97.7°F | Resp 18 | Wt 107.9 lb

## 2018-05-09 DIAGNOSIS — R21 Rash and other nonspecific skin eruption: Secondary | ICD-10-CM | POA: Insufficient documentation

## 2018-05-09 DIAGNOSIS — E871 Hypo-osmolality and hyponatremia: Secondary | ICD-10-CM | POA: Diagnosis not present

## 2018-05-09 DIAGNOSIS — M25519 Pain in unspecified shoulder: Secondary | ICD-10-CM | POA: Insufficient documentation

## 2018-05-09 DIAGNOSIS — C259 Malignant neoplasm of pancreas, unspecified: Secondary | ICD-10-CM

## 2018-05-09 DIAGNOSIS — R600 Localized edema: Secondary | ICD-10-CM | POA: Diagnosis not present

## 2018-05-09 DIAGNOSIS — T451X5A Adverse effect of antineoplastic and immunosuppressive drugs, initial encounter: Secondary | ICD-10-CM | POA: Diagnosis not present

## 2018-05-09 DIAGNOSIS — R748 Abnormal levels of other serum enzymes: Secondary | ICD-10-CM | POA: Diagnosis not present

## 2018-05-09 DIAGNOSIS — G4700 Insomnia, unspecified: Secondary | ICD-10-CM | POA: Diagnosis not present

## 2018-05-09 DIAGNOSIS — D6959 Other secondary thrombocytopenia: Secondary | ICD-10-CM | POA: Diagnosis not present

## 2018-05-09 DIAGNOSIS — F17210 Nicotine dependence, cigarettes, uncomplicated: Secondary | ICD-10-CM | POA: Diagnosis not present

## 2018-05-09 DIAGNOSIS — G8929 Other chronic pain: Secondary | ICD-10-CM

## 2018-05-09 DIAGNOSIS — I4891 Unspecified atrial fibrillation: Secondary | ICD-10-CM | POA: Insufficient documentation

## 2018-05-09 DIAGNOSIS — Z79899 Other long term (current) drug therapy: Secondary | ICD-10-CM | POA: Insufficient documentation

## 2018-05-09 DIAGNOSIS — R251 Tremor, unspecified: Secondary | ICD-10-CM | POA: Insufficient documentation

## 2018-05-09 DIAGNOSIS — Z5111 Encounter for antineoplastic chemotherapy: Secondary | ICD-10-CM | POA: Diagnosis not present

## 2018-05-09 DIAGNOSIS — R630 Anorexia: Secondary | ICD-10-CM

## 2018-05-09 DIAGNOSIS — R5383 Other fatigue: Secondary | ICD-10-CM

## 2018-05-09 DIAGNOSIS — C786 Secondary malignant neoplasm of retroperitoneum and peritoneum: Secondary | ICD-10-CM | POA: Diagnosis not present

## 2018-05-09 DIAGNOSIS — R109 Unspecified abdominal pain: Secondary | ICD-10-CM

## 2018-05-09 LAB — COMPREHENSIVE METABOLIC PANEL
ALBUMIN: 3.5 g/dL (ref 3.5–5.0)
ALT: 141 U/L — ABNORMAL HIGH (ref 0–44)
ANION GAP: 6 (ref 5–15)
AST: 157 U/L — ABNORMAL HIGH (ref 15–41)
Alkaline Phosphatase: 970 U/L — ABNORMAL HIGH (ref 38–126)
BUN: 8 mg/dL (ref 6–20)
CO2: 26 mmol/L (ref 22–32)
Calcium: 9 mg/dL (ref 8.9–10.3)
Chloride: 97 mmol/L — ABNORMAL LOW (ref 98–111)
Creatinine, Ser: 0.38 mg/dL — ABNORMAL LOW (ref 0.61–1.24)
GFR calc Af Amer: >60 mL/min (ref >60)
GFR calc non Af Amer: >60 mL/min (ref >60)
GLUCOSE: 110 mg/dL — AB (ref 70–99)
Potassium: 3.8 mmol/L (ref 3.5–5.1)
Sodium: 129 mmol/L — ABNORMAL LOW (ref 135–145)
Total Bilirubin: 1.2 mg/dL (ref 0.3–1.2)
Total Protein: 7 g/dL (ref 6.5–8.1)

## 2018-05-09 LAB — CBC WITH DIFFERENTIAL/PLATELET
Abs Immature Granulocytes: 0.01 10*3/uL (ref 0.00–0.07)
Basophils Absolute: 0.1 10*3/uL (ref 0.0–0.1)
Basophils Relative: 1 %
Eosinophils Absolute: 0.3 10*3/uL (ref 0.0–0.5)
Eosinophils Relative: 5 %
HCT: 43 % (ref 39.0–52.0)
Hemoglobin: 15.2 g/dL (ref 13.0–17.0)
Immature Granulocytes: 0 %
Lymphocytes Relative: 18 %
Lymphs Abs: 1.2 10*3/uL (ref 0.7–4.0)
MCH: 34.2 pg — ABNORMAL HIGH (ref 26.0–34.0)
MCHC: 35.3 g/dL (ref 30.0–36.0)
MCV: 96.6 fL (ref 80.0–100.0)
Monocytes Absolute: 0.7 10*3/uL (ref 0.1–1.0)
Monocytes Relative: 10 %
Neutro Abs: 4.4 10*3/uL (ref 1.7–7.7)
Neutrophils Relative %: 66 %
PLATELETS: 173 10*3/uL (ref 150–400)
RBC: 4.45 MIL/uL (ref 4.22–5.81)
RDW: 14.6 % (ref 11.5–15.5)
WBC: 6.7 10*3/uL (ref 4.0–10.5)
nRBC: 0 % (ref 0.0–0.2)

## 2018-05-09 LAB — MAGNESIUM: Magnesium: 1.9 mg/dL (ref 1.7–2.4)

## 2018-05-09 MED ORDER — MEGESTROL ACETATE 40 MG PO TABS: 40.0000 mg | ORAL_TABLET | Freq: Every day | ORAL | 2 refills | Status: AC

## 2018-05-09 MED ORDER — HEPARIN SOD (PORK) LOCK FLUSH 100 UNIT/ML IV SOLN
500.0000 [IU] | Freq: Once | INTRAVENOUS | Status: AC
  Administered 2018-05-09: 500 [IU] via INTRAVENOUS

## 2018-05-09 MED ORDER — IPRATROPIUM-ALBUTEROL 20-100 MCG/ACT IN AERS: 1.0000 | INHALATION_SPRAY | Freq: Four times a day (QID) | RESPIRATORY_TRACT | 6 refills | Status: DC

## 2018-05-09 NOTE — Progress Notes (Signed)
Patient here today for follow up regarding pancreatic cancer, PET results. Patient reports back pain 7/10.

## 2018-05-10 LAB — CANCER ANTIGEN 19-9: CA 19-9: 1176 U/mL — ABNORMAL HIGH (ref 0–35)

## 2018-05-11 NOTE — Progress Notes (Signed)
DISCONTINUE ON PATHWAY REGIMEN - Pancreatic     A cycle is every 28 days:     Nab-paclitaxel (protein bound)      Gemcitabine   **Always confirm dose/schedule in your pharmacy ordering system**  REASON: Disease Progression PRIOR TREATMENT: PANOS51: Nab-Paclitaxel (Abraxane(R)) 125 mg/m2 D1, 8, 15 + Gemcitabine 1,000 mg/m2 D1, 8, 15 q28 Days Until Progression or Toxicity TREATMENT RESPONSE: Progressive Disease (PD)  START OFF PATHWAY REGIMEN - Pancreatic   OFF00015:Gemcitabine 1,000 mg/m2 Days 1, 8, 15 q28 Days:   A cycle is every 28 days (3 weeks on and 1 week off):     Gemcitabine   **Always confirm dose/schedule in your pharmacy ordering system**  Patient Characteristics: Adenocarcinoma, Metastatic Disease, Third Line and Beyond, MSS/pMMR or MSI Unknown Histology: Adenocarcinoma Current evidence of distant metastases<= Yes AJCC T Category: [place] AJCC N Category: NX AJCC M Category: M1 AJCC 8 Stage Grouping: IV Line of Therapy: Third Engineer, civil (consulting) Status: Unknown Intent of Therapy: Non-Curative / Palliative Intent, Discussed with Patient

## 2018-05-14 NOTE — Progress Notes (Signed)
[facility]  [phone] [phone]  ID: Benjamin Diaz. OB: [birth year]  MR#: [id number]  JJO#:[id number]  Patient Care Team: Patient, [name] as PCP - General (General Practice) [name], MD as Consulting Physician (Oncology) [name], MD (Inactive) as Consulting Physician (Gastroenterology) [name], MD as Consulting Physician (Gastroenterology)  CHIEF COMPLAINT: Stage IV pancreatic cancer  INTERVAL HISTORY: Patient returns to clinic today for further evaluation and initiation of cycle 1, day 1 of single agent gemcitabine.  Patient states he has had difficulty sleeping this past week, but otherwise feels well.  His pain is well controlled on his current narcotic regimen. He has chronic weakness and fatigue that is unchanged. He has a poor appetite, but no weight loss.  He has no neurologic complaints.  He denies any recent fevers or illnesses.  He denies any chest pain, shortness of breath, cough, or hemoptysis.  He denies any nausea, vomiting, constipation, or diarrhea. He has no urinary complaints.  Patient offers no further specific complaints today.  REVIEW OF SYSTEMS:   Review of Systems  Constitutional: Positive for malaise/fatigue. Negative for fever and weight loss.  Respiratory: Negative.  Negative for cough and shortness of breath.   Cardiovascular: Negative.  Negative for chest pain and leg swelling.  Gastrointestinal: Positive for abdominal pain. Negative for diarrhea, nausea and vomiting.  Genitourinary: Negative for dysuria, flank pain, frequency and urgency.  Musculoskeletal: Negative for back pain and joint pain.  Skin: Negative.  Negative for itching and rash.  Neurological: Positive for tingling, sensory change and weakness. Negative for tremors and focal weakness.  Psychiatric/Behavioral: The patient has insomnia. The patient is not nervous/anxious.     As per HPI. Otherwise, a complete review of  systems is negative.  PAST MEDICAL HISTORY: Past Medical History:  Diagnosis Date  . Arthritis   . Asthma   . Atrial fibrillation ([place])   . Closed left ankle fracture age 75  . COPD (chronic obstructive pulmonary disease) ([place])   . Dysrhythmia   . Pancreatic cancer ([place]) 06/2016   Chemo tx's.   . Pneumonia 2011  . Right shoulder injury 09/27/2016  . Sciatica    left leg pinched nerve numb at times about once a year    PAST SURGICAL HISTORY: Past Surgical History:  Procedure Laterality Date  . BILIARY STENT PLACEMENT N/A 09/01/2016   Procedure: BILIARY STENT PLACEMENT;  Surgeon: [name], MD;  Location: [facility];  Service: Endoscopy;  Laterality: N/A;  . DIAGNOSTIC LAPAROSCOPIC LIVER BIOPSY  09/16/2016   Procedure: DIAGNOSTIC LAPAROSCOPIC LIVER BIOPSY;  Surgeon: [name], MD;  Location: [facility];  Service: General;;  . ERCP N/A 07/09/2016   Procedure: ENDOSCOPIC RETROGRADE CHOLANGIOPANCREATOGRAPHY (ERCP);  Surgeon: [name], MD;  Location: [facility];  Service: Endoscopy;  Laterality: N/A;  . ERCP N/A 09/01/2016   Procedure: ENDOSCOPIC RETROGRADE CHOLANGIOPANCREATOGRAPHY (ERCP);  Surgeon: [name], MD;  Location: [facility];  Service: Endoscopy;  Laterality: N/A;  . ERCP N/A 06/28/2017   Procedure: ENDOSCOPIC RETROGRADE CHOLANGIOPANCREATOGRAPHY (ERCP);  Surgeon: [name], MD;  Location: [facility];  Service: Endoscopy;  Laterality: N/A;  . ESOPHAGOGASTRODUODENOSCOPY (EGD) WITH PROPOFOL N/A 09/01/2016   Procedure: ESOPHAGOGASTRODUODENOSCOPY (EGD) WITH PROPOFOL;  Surgeon: [name], MD;  Location: [facility];  Service: Gastroenterology;  Laterality: N/A;  . EUS N/A 09/01/2016   Procedure: ESOPHAGEAL ENDOSCOPIC ULTRASOUND (EUS) RADIAL;  Surgeon: [name], MD;  Location: [facility];  Service: Endoscopy;  Laterality: N/A;  . FINE NEEDLE ASPIRATION  N/A 09/01/2016   Procedure: FINE NEEDLE ASPIRATION (FNA) RADIAL;  Surgeon: [name], MD;  Location:  [facility];  Service: Endoscopy;  Laterality: N/A;  . FOREIGN BODY REMOVAL N/A 09/01/2016   Procedure: FOREIGN BODY REMOVAL;  Surgeon: [name], MD;  Location: [facility];  Service: Gastroenterology;  Laterality: N/A;  . KNEE SURGERY Right 2002   arthroscopy  . PORTA CATH INSERTION N/A 09/20/2017   Procedure: PORTA CATH INSERTION;  Surgeon: [name], MD;  Location: [facility];  Service: Cardiovascular;  Laterality: N/A;  . SHOULDER SURGERY Right 2002    FAMILY HISTORY: Family History  Problem Relation Age of Onset  . Heart disease Father     ADVANCED DIRECTIVES (Y/N):  N  HEALTH MAINTENANCE: Social History   Tobacco Use  . Smoking status: Current Every Day Smoker    Packs/day: 1.00    Years: 40.00    Pack years: 40.00    Types: Cigarettes  . Smokeless tobacco: Never Used  Substance Use Topics  . Alcohol use: Yes    Alcohol/week: 7.0 standard drinks    Types: 7 Cans of beer per week    Comment: last drink 5 weeeks ago  april 2018  . Drug use: No     Colonoscopy:  PAP:  Bone density:  Lipid panel:  Allergies  Allergen Reactions  . No Known Allergies     Current Outpatient Medications  Medication Sig Dispense Refill  . acetaminophen (TYLENOL) 500 MG tablet Take 1,000 mg by mouth every 8 (eight) hours as needed.    Marland Kitchen albuterol (PROVENTIL HFA;VENTOLIN HFA) 108 (90 Base) MCG/ACT inhaler Inhale 1-2 puffs into the lungs every 6 (six) hours as needed for wheezing or shortness of breath. 1 Inhaler 6  . Ipratropium-Albuterol (COMBIVENT RESPIMAT) 20-100 MCG/ACT AERS respimat Inhale 1 puff into the lungs 4 (four) times daily. 1 Inhaler 6  . megestrol (MEGACE) 40 MG tablet Take 1 tablet (40 mg total) by mouth daily. 30 tablet 2  . Oxycodone HCl 10 MG TABS Take 1 tablet (10 mg total) by mouth 2 (two) times daily as needed. 60 tablet 0  . traMADol (ULTRAM) 50 MG tablet Take 1 tablet (50 mg total) by mouth every 6 (six) hours as needed. 30 tablet 0  .  XELODA 500 MG tablet TAKE 3 TABLETS (1,500 MG TOTAL) BY MOUTH 2 (TWO) TIMES DAILY AFTER A MEAL. TAKE FOR 14 DAYS ON, THEN 7 DAYS OFF. 84 tablet 2   No current facility-administered medications for this visit.    Facility-Administered Medications Ordered in Other Visits  Medication Dose Route Frequency Provider Last Rate Last Dose  . sodium chloride flush (NS) 0.9 % injection 10 mL  10 mL Intravenous PRN Lloyd Huger, MD   10 mL at 05/15/18 0842   OBJECTIVE: Vitals:   05/15/18 0855  BP: 112/74  Pulse: 91  Temp: 98.4 F (36.9 C)     Body mass index is 14.84 kg/m.    ECOG FS:1 - Symptomatic but completely ambulatory  General: Thin, no acute distress. Eyes: Pink conjunctiva, anicteric sclera. HEENT: Normocephalic, moist mucous membranes. Lungs: Clear to auscultation bilaterally. Heart: Regular rate and rhythm. No rubs, murmurs, or gallops. Abdomen: Soft, nontender, nondistended. No organomegaly noted, normoactive bowel sounds. Musculoskeletal: No edema, cyanosis, or clubbing. Neuro: Alert, answering all questions appropriately. Cranial nerves grossly intact. Skin: No rashes or petechiae noted. Psych: Normal affect.  LAB RESULTS:  Lab Results  Component Value Date   NA 130 (L)  05/15/2018   K 3.7 05/15/2018   CL 96 (L) 05/15/2018   CO2 24 05/15/2018   GLUCOSE 124 (H) 05/15/2018   BUN 11 05/15/2018   CREATININE 0.32 (L) 05/15/2018   CALCIUM 9.0 05/15/2018   PROT 7.1 05/15/2018   ALBUMIN 3.6 05/15/2018   AST 139 (H) 05/15/2018   ALT 136 (H) 05/15/2018   ALKPHOS 982 (H) 05/15/2018   BILITOT 1.4 (H) 05/15/2018   GFRNONAA >60 05/15/2018   GFRAA >60 05/15/2018    Lab Results  Component Value Date   WBC 7.9 05/15/2018   NEUTROABS 5.5 05/15/2018   HGB 14.7 05/15/2018   HCT 41.1 05/15/2018   MCV 96.9 05/15/2018   PLT 142 (L) 05/15/2018      STUDIES: Nm Pet Image Restag (ps) Skull Base To Thigh  Result Date: 05/05/2018 CLINICAL DATA:  Subsequent treatment  strategy for pancreatic cancer. EXAM: NUCLEAR MEDICINE PET SKULL BASE TO THIGH TECHNIQUE: 6.5 mCi F-18 FDG was injected intravenously. Full-ring PET imaging was performed from the skull base to thigh after the radiotracer. CT data was obtained and used for attenuation correction and anatomic localization. Fasting blood glucose: 89 mg/dl COMPARISON:  Abdomen and pelvis CT 04/03/2018.  PET-CT 04/12/2017 FINDINGS: Mediastinal blood pool activity: SUV max 2.2 NECK: No hypermetabolic lymph nodes in the neck. Incidental CT findings: none CHEST: No hypermetabolic mediastinal or hilar nodes. No suspicious pulmonary nodules on the CT scan. Low level uptake in the distal esophagus likely physiologic. Incidental CT findings: Right Port-A-Cath tip is positioned in the upper right atrium. Centrilobular and paraseptal emphysema noted. Chronic airway impaction with scarring in the medial left lower lobe is similar to prior. Tiny pulmonary nodules identified on the recent diagnostic CT scan are not as well demonstrated on today's non breath hold CT scan acquired for attenuation correction. ABDOMEN/PELVIS: The patient's known pancreatic lesion is hypermetabolic with SUV max = 9.8 increased from 5.0 on previous PET-CT of 04/12/2017. as on prior PET imaging, the hypermetabolic activity encompasses the distal common bile duct stent. Focal hypermetabolic uptake is identified at the distal tip of the common duct stent, indeterminate. No evidence for hypermetabolic liver metastases. No hypermetabolic distant metastases in the abdomen or pelvis. Incidental CT findings: Intrahepatic biliary duct dilatation is similar to 04/03/2018. There is abdominal aortic atherosclerosis without aneurysm. SKELETON: No focal hypermetabolic activity to suggest skeletal metastasis. Incidental CT findings: No worrisome lytic or sclerotic osseous abnormality. IMPRESSION: 1. Hypermetabolism associated with the known pancreatic head mass has progressed in the  interval since prior PET-CT. 2. No hypermetabolic liver metastases evident on today's exam. No hypermetabolic distant metastases identified. 3.  Emphysema. (ICD10-J43.9) 4.  Aortic Atherosclerois (ICD10-170.0) Electronically Signed   By: Misty Stanley M.D.   On: 05/05/2018 14:13    ASSESSMENT: Stage IV pancreatic cancer  PLAN:   1. Stage IV pancreatic cancer: Imaging, pathology, and Op note reviewed independently confirming stage IV disease with multiple peritoneal implants.  PET scan results from May 05, 2018 reviewed independently and reported as above with clear progression of disease.  Patient CA-19-9 is also significantly increased at 1176.  Patient wishes to continue treatment, but expressed understanding that his treatment options are limited.  Patient stated today that he has no interest in hospice and if chemotherapy were not an option he would like to pursue surgery or ablation.  We did discuss that this is likely not possible but could be considered.  Proceed with cycle 1, day 1 of gemcitabine today.  Return to clinic  in 1 week for further evaluation and consideration of cycle 1, day 8.   2.  Elevated liver enzymes: AST and ALT are increased, but trending down from several weeks ago.  Patient has not been evaluated by GI secondary to transportation issues.  Proceed with treatment as above. 3. Hyponatremia: Chronic and relatively unchanged.  Sodium is 130 today. 4. Peripheral edema: Continue elevation nightly.  Lower extremity ultrasound did not reveal DVT.   5.  Elevated bilirubin: Slightly elevated at 1.4.  Monitor. 6.  Pain: Chronic and unchanged.  Continue oxycodone at night and patient was given a prescription for tramadol for daytime use. 7.  Poor appetite: Patient has Megace prescribed, but is unclear his compliance.   8.  Shortness of breath: Chronic and unchanged.  Patient does not complain of this today.  Continue inhalers as prescribed. 9.  Shoulder pain/tremor: Patient was  previously given a referral to orthopedics as he is received injections in the past that improved his symptoms. 10.  Insomnia: Patient was given a prescription for Ambien.  Patient expressed understanding and was in agreement with this plan. He also understands that He can call clinic at any time with any questions, concerns, or complaints.   Cancer Staging Pancreatic cancer [facility]) Staging form: Exocrine Pancreas, AJCC 8th Edition - Clinical stage from 09/25/2016: Stage IV (cTX, cNX, pM1) - Signed by Lloyd Huger, MD on 09/25/2016   Lloyd Huger, MD   05/15/2018 12:59 PM

## 2018-05-15 ENCOUNTER — Inpatient Hospital Stay: Payer: Medicaid Other

## 2018-05-15 ENCOUNTER — Other Ambulatory Visit: Payer: Self-pay

## 2018-05-15 ENCOUNTER — Inpatient Hospital Stay (HOSPITAL_BASED_OUTPATIENT_CLINIC_OR_DEPARTMENT_OTHER): Payer: Medicaid Other | Admitting: Oncology

## 2018-05-15 VITALS — BP 112/74 | HR 91 | Temp 98.4°F | Ht 72.0 in | Wt 109.4 lb

## 2018-05-15 DIAGNOSIS — M25519 Pain in unspecified shoulder: Secondary | ICD-10-CM

## 2018-05-15 DIAGNOSIS — G8929 Other chronic pain: Secondary | ICD-10-CM

## 2018-05-15 DIAGNOSIS — C259 Malignant neoplasm of pancreas, unspecified: Secondary | ICD-10-CM

## 2018-05-15 DIAGNOSIS — E871 Hypo-osmolality and hyponatremia: Secondary | ICD-10-CM

## 2018-05-15 DIAGNOSIS — R0602 Shortness of breath: Secondary | ICD-10-CM

## 2018-05-15 DIAGNOSIS — R748 Abnormal levels of other serum enzymes: Secondary | ICD-10-CM | POA: Diagnosis not present

## 2018-05-15 DIAGNOSIS — C786 Secondary malignant neoplasm of retroperitoneum and peritoneum: Secondary | ICD-10-CM | POA: Diagnosis not present

## 2018-05-15 DIAGNOSIS — R740 Nonspecific elevation of levels of transaminase and lactic acid dehydrogenase [LDH]: Secondary | ICD-10-CM

## 2018-05-15 DIAGNOSIS — R251 Tremor, unspecified: Secondary | ICD-10-CM

## 2018-05-15 DIAGNOSIS — Z5111 Encounter for antineoplastic chemotherapy: Secondary | ICD-10-CM | POA: Diagnosis not present

## 2018-05-15 DIAGNOSIS — G4700 Insomnia, unspecified: Secondary | ICD-10-CM

## 2018-05-15 DIAGNOSIS — R630 Anorexia: Secondary | ICD-10-CM

## 2018-05-15 LAB — CBC WITH DIFFERENTIAL/PLATELET
ABS IMMATURE GRANULOCYTES: 0.01 10*3/uL (ref 0.00–0.07)
BASOS ABS: 0.1 10*3/uL (ref 0.0–0.1)
Basophils Relative: 1 %
Eosinophils Absolute: 0.4 10*3/uL (ref 0.0–0.5)
Eosinophils Relative: 5 %
HCT: 41.1 % (ref 39.0–52.0)
Hemoglobin: 14.7 g/dL (ref 13.0–17.0)
Immature Granulocytes: 0 %
LYMPHS ABS: 1.3 10*3/uL (ref 0.7–4.0)
Lymphocytes Relative: 16 %
MCH: 34.7 pg — ABNORMAL HIGH (ref 26.0–34.0)
MCHC: 35.8 g/dL (ref 30.0–36.0)
MCV: 96.9 fL (ref 80.0–100.0)
Monocytes Absolute: 0.7 10*3/uL (ref 0.1–1.0)
Monocytes Relative: 9 %
Neutro Abs: 5.5 10*3/uL (ref 1.7–7.7)
Neutrophils Relative %: 69 %
Platelets: 142 10*3/uL — ABNORMAL LOW (ref 150–400)
RBC: 4.24 MIL/uL (ref 4.22–5.81)
RDW: 14.3 % (ref 11.5–15.5)
WBC: 7.9 10*3/uL (ref 4.0–10.5)
nRBC: 0 % (ref 0.0–0.2)

## 2018-05-15 LAB — COMPREHENSIVE METABOLIC PANEL
ALT: 136 U/L — ABNORMAL HIGH (ref 0–44)
AST: 139 U/L — AB (ref 15–41)
Albumin: 3.6 g/dL (ref 3.5–5.0)
Alkaline Phosphatase: 982 U/L — ABNORMAL HIGH (ref 38–126)
Anion gap: 10 (ref 5–15)
BILIRUBIN TOTAL: 1.4 mg/dL — AB (ref 0.3–1.2)
BUN: 11 mg/dL (ref 6–20)
CO2: 24 mmol/L (ref 22–32)
Calcium: 9 mg/dL (ref 8.9–10.3)
Chloride: 96 mmol/L — ABNORMAL LOW (ref 98–111)
Creatinine, Ser: 0.32 mg/dL — ABNORMAL LOW (ref 0.61–1.24)
GFR calc Af Amer: >60 mL/min (ref >60)
GFR calc non Af Amer: >60 mL/min (ref >60)
Glucose, Bld: 124 mg/dL — ABNORMAL HIGH (ref 70–99)
Potassium: 3.7 mmol/L (ref 3.5–5.1)
Sodium: 130 mmol/L — ABNORMAL LOW (ref 135–145)
Total Protein: 7.1 g/dL (ref 6.5–8.1)

## 2018-05-15 LAB — MAGNESIUM: Magnesium: 1.7 mg/dL (ref 1.7–2.4)

## 2018-05-15 MED ORDER — DIPHENHYDRAMINE HCL 50 MG/ML IJ SOLN
25.0000 mg | Freq: Once | INTRAMUSCULAR | Status: AC
  Administered 2018-05-15: 25 mg via INTRAVENOUS
  Filled 2018-05-15: qty 1

## 2018-05-15 MED ORDER — SODIUM CHLORIDE 0.9% FLUSH
10.0000 mL | INTRAVENOUS | Status: DC | PRN
  Administered 2018-05-15: 10 mL via INTRAVENOUS
  Filled 2018-05-15: qty 10

## 2018-05-15 MED ORDER — SODIUM CHLORIDE 0.9 % IV SOLN
Freq: Once | INTRAVENOUS | Status: AC
  Administered 2018-05-15: 10:00:00 via INTRAVENOUS
  Filled 2018-05-15: qty 250

## 2018-05-15 MED ORDER — HEPARIN SOD (PORK) LOCK FLUSH 100 UNIT/ML IV SOLN
500.0000 [IU] | Freq: Once | INTRAVENOUS | Status: AC
  Administered 2018-05-15: 500 [IU] via INTRAVENOUS
  Filled 2018-05-15: qty 5

## 2018-05-15 MED ORDER — PROCHLORPERAZINE MALEATE 10 MG PO TABS: 10.0000 mg | ORAL_TABLET | Freq: Four times a day (QID) | ORAL | 0 refills | Status: AC | PRN

## 2018-05-15 MED ORDER — PROCHLORPERAZINE MALEATE 10 MG PO TABS
10.0000 mg | ORAL_TABLET | Freq: Once | ORAL | Status: AC
  Administered 2018-05-15: 10 mg via ORAL
  Filled 2018-05-15: qty 1

## 2018-05-15 MED ORDER — SODIUM CHLORIDE 0.9 % IV SOLN
1600.0000 mg | Freq: Once | INTRAVENOUS | Status: AC
  Administered 2018-05-15: 1600 mg via INTRAVENOUS
  Filled 2018-05-15: qty 15.78

## 2018-05-15 MED ORDER — METHYLPREDNISOLONE SODIUM SUCC 125 MG IJ SOLR
40.0000 mg | Freq: Once | INTRAMUSCULAR | Status: AC
  Administered 2018-05-15: 40 mg via INTRAVENOUS
  Filled 2018-05-15: qty 2

## 2018-05-15 MED ORDER — FAMOTIDINE IN NACL 20-0.9 MG/50ML-% IV SOLN
20.0000 mg | Freq: Once | INTRAVENOUS | Status: AC
  Administered 2018-05-15: 20 mg via INTRAVENOUS
  Filled 2018-05-15: qty 50

## 2018-05-15 MED ORDER — ZOLPIDEM TARTRATE 10 MG PO TABS: 10.0000 mg | ORAL_TABLET | Freq: Every evening | ORAL | 1 refills | Status: DC | PRN

## 2018-05-15 NOTE — Progress Notes (Signed)
Patient is here today to follow up on his Malignant neoplasm of pancreas. Patient stated that he was not able to sleep last night and that he frequently has this problem. Patient stated that he doesn't take anything to help him sleep. Patient also stated that he currently has poor appetite and that he forces himself to eat but he does not eat much.

## 2018-05-15 NOTE — Addendum Note (Signed)
Addended by: Wayna Chalet on: 05/15/2018 03:12 PM   Modules accepted: Orders

## 2018-05-16 LAB — CANCER ANTIGEN 19-9: CA 19-9: 1395 U/mL — ABNORMAL HIGH (ref 0–35)

## 2018-05-18 ENCOUNTER — Other Ambulatory Visit: Payer: Self-pay | Admitting: Oncology

## 2018-05-21 NOTE — Progress Notes (Signed)
[place]  [phone] [phone]  ID: [name]. OB: April 21, 1960  MR#: [id number]  QMG#:[id number]  Patient Care Team: Patient, [name] as PCP - General (General Practice) [name], MD as Consulting Physician (Oncology) [name], MD (Inactive) as Consulting Physician (Gastroenterology) [name], MD as Consulting Physician (Gastroenterology)  CHIEF COMPLAINT: Stage IV pancreatic cancer  INTERVAL HISTORY: Patient returns to clinic today for further evaluation and consideration of cycle 1, day 8 of single agent gemcitabine.  Patient states he tolerated his treatment well, but did develop a rash approximately 2 to 3 days after his infusion.  It is pruritic, but tolerable.  He otherwise feels well.  His pain is well controlled on his current narcotic regimen. He has chronic weakness and fatigue that is unchanged. He has a poor appetite, but no weight loss.  He has no neurologic complaints.  He denies any recent fevers or illnesses.  He denies any chest pain, shortness of breath, cough, or hemoptysis.  He denies any nausea, vomiting, constipation, or diarrhea. He has no urinary complaints.  Patient offers no further specific complaints today.  REVIEW OF SYSTEMS:   Review of Systems  Constitutional: Positive for malaise/fatigue. Negative for fever and weight loss.  Respiratory: Negative.  Negative for cough and shortness of breath.   Cardiovascular: Negative.  Negative for chest pain and leg swelling.  Gastrointestinal: Positive for abdominal pain. Negative for diarrhea, nausea and vomiting.  Genitourinary: Negative for dysuria, flank pain, frequency and urgency.  Musculoskeletal: Negative for back pain and joint pain.  Skin: Positive for itching and rash.  Neurological: Positive for tingling, sensory change and weakness. Negative for tremors and focal weakness.  Psychiatric/Behavioral: The patient is not nervous/anxious and  does not have insomnia.     As per HPI. Otherwise, a complete review of systems is negative.  PAST MEDICAL HISTORY: Past Medical History:  Diagnosis Date  . Arthritis   . Asthma   . Atrial fibrillation ([place])   . Closed left ankle fracture age 62  . COPD (chronic obstructive pulmonary disease) ([place])   . Dysrhythmia   . Pancreatic cancer ([place]) 06/2016   Chemo tx's.   . Pneumonia 2011  . Right shoulder injury 09/27/2016  . Sciatica    left leg pinched nerve numb at times about once a year    PAST SURGICAL HISTORY: Past Surgical History:  Procedure Laterality Date  . BILIARY STENT PLACEMENT N/A 09/01/2016   Procedure: BILIARY STENT PLACEMENT;  Surgeon: [name], MD;  Location: [facility];  Service: Endoscopy;  Laterality: N/A;  . DIAGNOSTIC LAPAROSCOPIC LIVER BIOPSY  09/16/2016   Procedure: DIAGNOSTIC LAPAROSCOPIC LIVER BIOPSY;  Surgeon: [name], MD;  Location: [facility];  Service: General;;  . ERCP N/A 07/09/2016   Procedure: ENDOSCOPIC RETROGRADE CHOLANGIOPANCREATOGRAPHY (ERCP);  Surgeon: [name], MD;  Location: [facility];  Service: Endoscopy;  Laterality: N/A;  . ERCP N/A 09/01/2016   Procedure: ENDOSCOPIC RETROGRADE CHOLANGIOPANCREATOGRAPHY (ERCP);  Surgeon: [name], MD;  Location: [facility];  Service: Endoscopy;  Laterality: N/A;  . ERCP N/A 06/28/2017   Procedure: ENDOSCOPIC RETROGRADE CHOLANGIOPANCREATOGRAPHY (ERCP);  Surgeon: [name], MD;  Location: [facility];  Service: Endoscopy;  Laterality: N/A;  . ESOPHAGOGASTRODUODENOSCOPY (EGD) WITH PROPOFOL N/A 09/01/2016   Procedure: ESOPHAGOGASTRODUODENOSCOPY (EGD) WITH PROPOFOL;  Surgeon: [name], MD;  Location: [facility];  Service: Gastroenterology;  Laterality: N/A;  . EUS N/A 09/01/2016   Procedure: ESOPHAGEAL ENDOSCOPIC ULTRASOUND (EUS) RADIAL;  Surgeon: [name],  MD;  Location: [facility];  Service: Endoscopy;  Laterality: N/A;  . FINE NEEDLE ASPIRATION N/A 09/01/2016   Procedure: FINE  NEEDLE ASPIRATION (FNA) RADIAL;  Surgeon: [name], MD;  Location: [facility];  Service: Endoscopy;  Laterality: N/A;  . FOREIGN BODY REMOVAL N/A 09/01/2016   Procedure: FOREIGN BODY REMOVAL;  Surgeon: [name], MD;  Location: [facility];  Service: Gastroenterology;  Laterality: N/A;  . KNEE SURGERY Right 2002   arthroscopy  . PORTA CATH INSERTION N/A 09/20/2017   Procedure: PORTA CATH INSERTION;  Surgeon: [name], MD;  Location: [facility];  Service: Cardiovascular;  Laterality: N/A;  . SHOULDER SURGERY Right 2002    FAMILY HISTORY: Family History  Problem Relation Age of Onset  . Heart disease Father     ADVANCED DIRECTIVES (Y/N):  N  HEALTH MAINTENANCE: Social History   Tobacco Use  . Smoking status: Current Every Day Smoker    Packs/day: 1.00    Years: 40.00    Pack years: 40.00    Types: Cigarettes  . Smokeless tobacco: Never Used  Substance Use Topics  . Alcohol use: Yes    Alcohol/week: 7.0 standard drinks    Types: 7 Cans of beer per week    Comment: last drink 5 weeeks ago  april 2018  . Drug use: No     Colonoscopy:  PAP:  Bone density:  Lipid panel:  Allergies  Allergen Reactions  . No Known Allergies     Current Outpatient Medications  Medication Sig Dispense Refill  . acetaminophen (TYLENOL) 500 MG tablet Take 1,000 mg by mouth every 8 (eight) hours as needed.    Marland Kitchen albuterol (PROVENTIL HFA;VENTOLIN HFA) 108 (90 Base) MCG/ACT inhaler Inhale 1-2 puffs into the lungs every 6 (six) hours as needed for wheezing or shortness of breath. 1 Inhaler 6  . Ipratropium-Albuterol (COMBIVENT RESPIMAT) 20-100 MCG/ACT AERS respimat Inhale 1 puff into the lungs 4 (four) times daily. 1 Inhaler 6  . megestrol (MEGACE) 40 MG tablet Take 1 tablet (40 mg total) by mouth daily. 30 tablet 2  . Oxycodone HCl 10 MG TABS Take 1 tablet (10 mg total) by mouth 2 (two) times daily as needed. 60 tablet 0  . prochlorperazine (COMPAZINE) 10 MG tablet  Take 1 tablet (10 mg total) by mouth every 6 (six) hours as needed for nausea or vomiting. 60 tablet 0  . traMADol (ULTRAM) 50 MG tablet Take 1 tablet (50 mg total) by mouth every 6 (six) hours as needed. 30 tablet 0  . XELODA 500 MG tablet TAKE 3 TABLETS (1,500 MG TOTAL) BY MOUTH 2 (TWO) TIMES DAILY AFTER A MEAL. TAKE FOR 14 DAYS ON, THEN 7 DAYS OFF. 84 tablet 2  . zolpidem (AMBIEN) 10 MG tablet Take 1 tablet (10 mg total) by mouth at bedtime as needed for up to 30 days for sleep. 30 tablet 1  . predniSONE (STERAPRED UNI-PAK 21 TAB) 10 MG (21) TBPK tablet Taper as directed. 21 tablet 0   No current facility-administered medications for this visit.    OBJECTIVE: Vitals:   05/22/18 0843  BP: 116/75  Pulse: 83  Temp: 98.3 F (36.8 C)     Body mass index is 14.96 kg/m.    ECOG FS:1 - Symptomatic but completely ambulatory  General: Thin, no acute distress. Eyes: Pink conjunctiva, anicteric sclera. HEENT: Normocephalic, moist mucous membranes. Lungs: Clear to auscultation bilaterally. Heart: Regular rate and rhythm. No rubs, murmurs, or gallops. Abdomen: Soft, nontender, nondistended.  No organomegaly noted, normoactive bowel sounds. Musculoskeletal: No edema, cyanosis, or clubbing. Neuro: Alert, answering all questions appropriately. Cranial nerves grossly intact. Skin: Maculopapular rash on torso and upper extremities. Psych: Normal affect.  LAB RESULTS:  Lab Results  Component Value Date   NA 129 (L) 05/22/2018   K 3.3 (L) 05/22/2018   CL 98 05/22/2018   CO2 24 05/22/2018   GLUCOSE 105 (H) 05/22/2018   BUN 10 05/22/2018   CREATININE 0.33 (L) 05/22/2018   CALCIUM 8.7 (L) 05/22/2018   PROT 6.7 05/22/2018   ALBUMIN 3.4 (L) 05/22/2018   AST 140 (H) 05/22/2018   ALT 131 (H) 05/22/2018   ALKPHOS 956 (H) 05/22/2018   BILITOT 1.3 (H) 05/22/2018   GFRNONAA >60 05/22/2018   GFRAA >60 05/22/2018    Lab Results  Component Value Date   WBC 5.9 05/22/2018   NEUTROABS 3.5  05/22/2018   HGB 13.3 05/22/2018   HCT 36.6 (L) 05/22/2018   MCV 96.8 05/22/2018   PLT 98 (L) 05/22/2018      STUDIES: Nm Pet Image Restag (ps) Skull Base To Thigh  Result Date: 05/05/2018 CLINICAL DATA:  Subsequent treatment strategy for pancreatic cancer. EXAM: NUCLEAR MEDICINE PET SKULL BASE TO THIGH TECHNIQUE: 6.5 mCi F-18 FDG was injected intravenously. Full-ring PET imaging was performed from the skull base to thigh after the radiotracer. CT data was obtained and used for attenuation correction and anatomic localization. Fasting blood glucose: 89 mg/dl COMPARISON:  Abdomen and pelvis CT 04/03/2018.  PET-CT 04/12/2017 FINDINGS: Mediastinal blood pool activity: SUV max 2.2 NECK: No hypermetabolic lymph nodes in the neck. Incidental CT findings: none CHEST: No hypermetabolic mediastinal or hilar nodes. No suspicious pulmonary nodules on the CT scan. Low level uptake in the distal esophagus likely physiologic. Incidental CT findings: Right Port-A-Cath tip is positioned in the upper right atrium. Centrilobular and paraseptal emphysema noted. Chronic airway impaction with scarring in the medial left lower lobe is similar to prior. Tiny pulmonary nodules identified on the recent diagnostic CT scan are not as well demonstrated on today's non breath hold CT scan acquired for attenuation correction. ABDOMEN/PELVIS: The patient's known pancreatic lesion is hypermetabolic with SUV max = 9.8 increased from 5.0 on previous PET-CT of 04/12/2017. as on prior PET imaging, the hypermetabolic activity encompasses the distal common bile duct stent. Focal hypermetabolic uptake is identified at the distal tip of the common duct stent, indeterminate. No evidence for hypermetabolic liver metastases. No hypermetabolic distant metastases in the abdomen or pelvis. Incidental CT findings: Intrahepatic biliary duct dilatation is similar to 04/03/2018. There is abdominal aortic atherosclerosis without aneurysm. SKELETON: No  focal hypermetabolic activity to suggest skeletal metastasis. Incidental CT findings: No worrisome lytic or sclerotic osseous abnormality. IMPRESSION: 1. Hypermetabolism associated with the known pancreatic head mass has progressed in the interval since prior PET-CT. 2. No hypermetabolic liver metastases evident on today's exam. No hypermetabolic distant metastases identified. 3.  Emphysema. (ICD10-J43.9) 4.  Aortic Atherosclerois (ICD10-170.0) Electronically Signed   By: Misty Stanley M.D.   On: 05/05/2018 14:13    ASSESSMENT: Stage IV pancreatic cancer  PLAN:   1. Stage IV pancreatic cancer: Imaging, pathology, and Op note reviewed independently confirming stage IV disease with multiple peritoneal implants.  PET scan results from May 05, 2018 reviewed independently with clear progression of disease.  Patient's CA-19 9 also increased to 1395, but after 1 treatment has decreased to 956. Patient wishes to continue treatment, but expressed understanding that his treatment options are limited.  Patient stated today that he has no interest in hospice and if chemotherapy were not an option he would like to pursue surgery or ablation.  We did discuss that this is likely not possible but could be considered.  Despite patient's rest, will proceed with cycle 1, day 8 of gemcitabine only today.  Return to clinic in 1 week for further evaluation and consideration of cycle 1, day 15.     2.  Elevated liver enzymes: Chronic and unchanged.  AST and ALT remain mildly increased. Patient has not been evaluated by GI secondary to transportation issues.  Proceed with treatment as above. 3. Hyponatremia: Chronic and relatively unchanged.  Sodium is 129 today. 4. Peripheral edema: Continue elevation nightly.  Lower extremity ultrasound did not reveal DVT.   5.  Elevated bilirubin: Mildly improved to 1.3.   6.  Pain: Chronic and unchanged.  Continue oxycodone at night and patient was given a prescription for tramadol for  daytime use. 7.  Poor appetite: Patient has Megace prescribed, but is unclear his compliance.   8.  Shortness of breath: Chronic and unchanged.  Patient does not complain of this today.  Continue inhalers as prescribed. 9.  Shoulder pain/tremor: Patient was previously given a referral to orthopedics as he is received injections in the past that improved his symptoms. 10.  Insomnia: Continue Ambien as needed. 11.  Rash: Secondary gemcitabine.  Continue with premedications during treatment and patient was given a Medrol Dosepak.  He may need a daily dose of dexamethasone in the future. 12.  Thrombocytopenia: Secondary gemcitabine.  Proceed with treatment as above.  Patient expressed understanding and was in agreement with this plan. He also understands that He can call clinic at any time with any questions, concerns, or complaints.   Cancer Staging Pancreatic cancer [facility]) Staging form: Exocrine Pancreas, AJCC 8th Edition - Clinical stage from 09/25/2016: Stage IV (cTX, cNX, pM1) - Signed by Lloyd Huger, MD on 09/25/2016   Lloyd Huger, MD   05/23/2018 9:36 AM

## 2018-05-22 ENCOUNTER — Inpatient Hospital Stay: Payer: Medicaid Other

## 2018-05-22 ENCOUNTER — Inpatient Hospital Stay (HOSPITAL_BASED_OUTPATIENT_CLINIC_OR_DEPARTMENT_OTHER): Payer: Medicaid Other | Admitting: Oncology

## 2018-05-22 ENCOUNTER — Other Ambulatory Visit: Payer: Self-pay

## 2018-05-22 ENCOUNTER — Ambulatory Visit: Payer: Medicaid Other

## 2018-05-22 ENCOUNTER — Inpatient Hospital Stay (HOSPITAL_BASED_OUTPATIENT_CLINIC_OR_DEPARTMENT_OTHER): Payer: Medicaid Other | Admitting: Hospice and Palliative Medicine

## 2018-05-22 VITALS — BP 116/75 | HR 83 | Temp 98.3°F | Ht 72.0 in | Wt 110.3 lb

## 2018-05-22 DIAGNOSIS — R21 Rash and other nonspecific skin eruption: Secondary | ICD-10-CM

## 2018-05-22 DIAGNOSIS — G4700 Insomnia, unspecified: Secondary | ICD-10-CM | POA: Diagnosis not present

## 2018-05-22 DIAGNOSIS — Z515 Encounter for palliative care: Secondary | ICD-10-CM

## 2018-05-22 DIAGNOSIS — Z79899 Other long term (current) drug therapy: Secondary | ICD-10-CM

## 2018-05-22 DIAGNOSIS — C259 Malignant neoplasm of pancreas, unspecified: Secondary | ICD-10-CM

## 2018-05-22 DIAGNOSIS — Z5111 Encounter for antineoplastic chemotherapy: Secondary | ICD-10-CM | POA: Diagnosis not present

## 2018-05-22 DIAGNOSIS — I4891 Unspecified atrial fibrillation: Secondary | ICD-10-CM

## 2018-05-22 DIAGNOSIS — D6959 Other secondary thrombocytopenia: Secondary | ICD-10-CM

## 2018-05-22 DIAGNOSIS — E871 Hypo-osmolality and hyponatremia: Secondary | ICD-10-CM

## 2018-05-22 DIAGNOSIS — R251 Tremor, unspecified: Secondary | ICD-10-CM

## 2018-05-22 DIAGNOSIS — R600 Localized edema: Secondary | ICD-10-CM

## 2018-05-22 DIAGNOSIS — M25519 Pain in unspecified shoulder: Secondary | ICD-10-CM

## 2018-05-22 DIAGNOSIS — R748 Abnormal levels of other serum enzymes: Secondary | ICD-10-CM

## 2018-05-22 DIAGNOSIS — F17210 Nicotine dependence, cigarettes, uncomplicated: Secondary | ICD-10-CM

## 2018-05-22 DIAGNOSIS — Z7189 Other specified counseling: Secondary | ICD-10-CM

## 2018-05-22 LAB — COMPREHENSIVE METABOLIC PANEL
ALT: 131 U/L — ABNORMAL HIGH (ref 0–44)
AST: 140 U/L — ABNORMAL HIGH (ref 15–41)
Albumin: 3.4 g/dL — ABNORMAL LOW (ref 3.5–5.0)
Alkaline Phosphatase: 956 U/L — ABNORMAL HIGH (ref 38–126)
Anion gap: 7 (ref 5–15)
BUN: 10 mg/dL (ref 6–20)
CHLORIDE: 98 mmol/L (ref 98–111)
CO2: 24 mmol/L (ref 22–32)
Calcium: 8.7 mg/dL — ABNORMAL LOW (ref 8.9–10.3)
Creatinine, Ser: 0.33 mg/dL — ABNORMAL LOW (ref 0.61–1.24)
GFR calc Af Amer: >60 mL/min (ref >60)
GFR calc non Af Amer: >60 mL/min (ref >60)
Glucose, Bld: 105 mg/dL — ABNORMAL HIGH (ref 70–99)
Potassium: 3.3 mmol/L — ABNORMAL LOW (ref 3.5–5.1)
Sodium: 129 mmol/L — ABNORMAL LOW (ref 135–145)
Total Bilirubin: 1.3 mg/dL — ABNORMAL HIGH (ref 0.3–1.2)
Total Protein: 6.7 g/dL (ref 6.5–8.1)

## 2018-05-22 LAB — CBC WITH DIFFERENTIAL/PLATELET
ABS IMMATURE GRANULOCYTES: 0.02 10*3/uL (ref 0.00–0.07)
Basophils Absolute: 0 10*3/uL (ref 0.0–0.1)
Basophils Relative: 0 %
Eosinophils Absolute: 0.6 10*3/uL — ABNORMAL HIGH (ref 0.0–0.5)
Eosinophils Relative: 11 %
HCT: 36.6 % — ABNORMAL LOW (ref 39.0–52.0)
Hemoglobin: 13.3 g/dL (ref 13.0–17.0)
IMMATURE GRANULOCYTES: 0 %
LYMPHS PCT: 22 %
Lymphs Abs: 1.3 10*3/uL (ref 0.7–4.0)
MCH: 35.2 pg — ABNORMAL HIGH (ref 26.0–34.0)
MCHC: 36.3 g/dL — ABNORMAL HIGH (ref 30.0–36.0)
MCV: 96.8 fL (ref 80.0–100.0)
Monocytes Absolute: 0.5 10*3/uL (ref 0.1–1.0)
Monocytes Relative: 8 %
Neutro Abs: 3.5 10*3/uL (ref 1.7–7.7)
Neutrophils Relative %: 59 %
Platelets: 98 10*3/uL — ABNORMAL LOW (ref 150–400)
RBC: 3.78 MIL/uL — ABNORMAL LOW (ref 4.22–5.81)
RDW: 14 % (ref 11.5–15.5)
WBC: 5.9 10*3/uL (ref 4.0–10.5)
nRBC: 0 % (ref 0.0–0.2)

## 2018-05-22 LAB — MAGNESIUM: Magnesium: 1.7 mg/dL (ref 1.7–2.4)

## 2018-05-22 MED ORDER — DIPHENHYDRAMINE HCL 50 MG/ML IJ SOLN
25.0000 mg | Freq: Once | INTRAMUSCULAR | Status: AC
  Administered 2018-05-22: 25 mg via INTRAVENOUS

## 2018-05-22 MED ORDER — PREDNISONE 10 MG (21) PO TBPK: ORAL_TABLET | ORAL | 0 refills | Status: DC

## 2018-05-22 MED ORDER — PROCHLORPERAZINE MALEATE 10 MG PO TABS
10.0000 mg | ORAL_TABLET | Freq: Once | ORAL | Status: AC
  Administered 2018-05-22: 10 mg via ORAL

## 2018-05-22 MED ORDER — SODIUM CHLORIDE 0.9 % IV SOLN
1600.0000 mg | Freq: Once | INTRAVENOUS | Status: AC
  Administered 2018-05-22: 1600 mg via INTRAVENOUS
  Filled 2018-05-22: qty 42

## 2018-05-22 MED ORDER — METHYLPREDNISOLONE SODIUM SUCC 125 MG IJ SOLR
40.0000 mg | Freq: Once | INTRAMUSCULAR | Status: AC
  Administered 2018-05-22: 40 mg via INTRAVENOUS

## 2018-05-22 MED ORDER — SODIUM CHLORIDE 0.9 % IV SOLN
Freq: Once | INTRAVENOUS | Status: AC
  Administered 2018-05-22: 10:00:00 via INTRAVENOUS
  Filled 2018-05-22: qty 250

## 2018-05-22 MED ORDER — HEPARIN SOD (PORK) LOCK FLUSH 100 UNIT/ML IV SOLN
500.0000 [IU] | Freq: Once | INTRAVENOUS | Status: AC | PRN
  Administered 2018-05-22: 500 [IU]

## 2018-05-22 MED ORDER — FAMOTIDINE IN NACL 20-0.9 MG/50ML-% IV SOLN
20.0000 mg | Freq: Once | INTRAVENOUS | Status: AC
  Administered 2018-05-22: 20 mg via INTRAVENOUS

## 2018-05-22 MED FILL — XELODA 500 MG TABLET: 500 | 21 days supply | Qty: 84 | Fill #2

## 2018-05-22 NOTE — Progress Notes (Signed)
[place]  [phone] [phone]   Name: Stephanie Mcglone. Date: 05/22/2018 MRN: [id number]  DOB: 1960-10-25  Patient Care Team: Patient, [name] as PCP - General (General Practice) [name], MD as Consulting Physician (Oncology) [name], MD (Inactive) as Consulting Physician (Gastroenterology) [name], MD as Consulting Physician (Gastroenterology)    REASON FOR CONSULTATION: Palliative Care consult requested for this 58 y.o. male with multiple medical problems including stage IV pancreatic cancer currently being treated on gemcitabine.  PET scan from May 05, 2018 revealed disease progression.  Patient also had elevated CA 19-9 was also elevated.  Hospice was discussed but patient opted to continue treatment.  However, treatment options are felt to be limited.  Patient was referred to palliative care to continue discussions regarding goals.  SOCIAL HISTORY:     reports that he has been smoking cigarettes. He has a 40.00 pack-year smoking history. He has never used smokeless tobacco. He reports current alcohol use of about 7.0 standard drinks of alcohol per week. He reports that he does not use drugs.   Patient was born in [place].  His father was in the [employer] and patient later moved to [place] where he lived until 2006.  Patient moved to [facility] after he divorced.  He has 3 children, all of whom live in [place].  Patient has a former significant other, [name], who is his primary support system here.  Patient lives at home alone.  He used to drive a forklift.  ADVANCE DIRECTIVES:  Does not have  CODE STATUS: Full code  PAST MEDICAL HISTORY: Past Medical History:  Diagnosis Date  . Arthritis   . Asthma   . Atrial fibrillation ([place])   . Closed left ankle fracture age 31  . COPD (chronic obstructive pulmonary disease) ([place])   . Dysrhythmia   . Pancreatic cancer ([place])  06/2016   Chemo tx's.   . Pneumonia 2011  . Right shoulder injury 09/27/2016  . Sciatica    left leg pinched nerve numb at times about once a year    PAST SURGICAL HISTORY:  Past Surgical History:  Procedure Laterality Date  . BILIARY STENT PLACEMENT N/A 09/01/2016   Procedure: BILIARY STENT PLACEMENT;  Surgeon: [name], MD;  Location: [facility];  Service: Endoscopy;  Laterality: N/A;  . DIAGNOSTIC LAPAROSCOPIC LIVER BIOPSY  09/16/2016   Procedure: DIAGNOSTIC LAPAROSCOPIC LIVER BIOPSY;  Surgeon: [name], MD;  Location: [facility];  Service: General;;  . ERCP N/A 07/09/2016   Procedure: ENDOSCOPIC RETROGRADE CHOLANGIOPANCREATOGRAPHY (ERCP);  Surgeon: [name], MD;  Location: [facility];  Service: Endoscopy;  Laterality: N/A;  . ERCP N/A 09/01/2016   Procedure: ENDOSCOPIC RETROGRADE CHOLANGIOPANCREATOGRAPHY (ERCP);  Surgeon: [name], MD;  Location: [facility];  Service: Endoscopy;  Laterality: N/A;  . ERCP N/A 06/28/2017   Procedure: ENDOSCOPIC RETROGRADE CHOLANGIOPANCREATOGRAPHY (ERCP);  Surgeon: [name], MD;  Location: [facility] END[facility]COPY;  Service: Endoscopy;  Laterality: N/A;  . ESOPHAGOGASTRODUODENOSCOPY (EGD) WITH PROPOFOL N/A 09/01/2016   Procedure: ESOPHAGOGASTRODUODENOSCOPY (EGD) WITH PROPOFOL;  Surgeon: [name], MD;  Location: [facility];  Service: Gastroenterology;  Laterality: N/A;  . EUS N/A 09/01/2016   Procedure: ESOPHAGEAL ENDOSCOPIC ULTRASOUND (EUS) RADIAL;  Surgeon: [name], MD;  Location: [facility];  Service: Endoscopy;  Laterality: N/A;  . FINE NEEDLE ASPIRATION N/A 09/01/2016   Procedure: FINE NEEDLE ASPIRATION (FNA) RADIAL;  Surgeon: [name], MD;  Location: [facility];  Service: Endoscopy;  Laterality:  N/A;  . FOREIGN BODY REMOVAL N/A 09/01/2016   Procedure: FOREIGN BODY REMOVAL;  Surgeon: [name], MD;  Location: [facility];  Service: Gastroenterology;  Laterality: N/A;  . KNEE SURGERY Right 2002   arthroscopy  . PORTA CATH  INSERTION N/A 09/20/2017   Procedure: PORTA CATH INSERTION;  Surgeon: [name], MD;  Location: [facility];  Service: Cardiovascular;  Laterality: N/A;  . SHOULDER SURGERY Right 2002    HEMATOLOGY/ONCOLOGY HISTORY:    Pancreatic cancer ([place])   09/20/2016 Initial Diagnosis    Pancreatic cancer ([place])    07/22/2017 - 09/06/2017 Chemotherapy    The patient had palonosetron (ALOXI) injection 0.25 mg, 0.25 mg, Intravenous,  Once, 2 of 6 cycles Administration: 0.25 mg (07/27/2017), 0.25 mg (08/17/2017) oxaliplatin (ELOXATIN) 150 mg in dextrose 5 % 500 mL chemo infusion, 155 mg (100 % of original dose 100 mg/m2), Intravenous,  Once, 2 of 6 cycles Dose modification: 100 mg/m2 (original dose 100 mg/m2, Cycle 1, Reason: Provider Judgment) Administration: 150 mg (07/27/2017), 150 mg (08/17/2017)  for chemotherapy treatment.     08/20/2017 - 09/06/2017 Chemotherapy    The patient had palonosetron (ALOXI) injection 0.25 mg, 0.25 mg, Intravenous,  Once, 0 of 4 cycles irinotecan (CAMPTOSAR) 240 mg in dextrose 5 % 500 mL chemo infusion, 150 mg/m2 = 240 mg (100 % of original dose 150 mg/m2), Intravenous,  Once, 0 of 4 cycles Dose modification: 150 mg/m2 (original dose 150 mg/m2, Cycle 1, Reason: Provider Judgment) leucovorin 620 mg in dextrose 5 % 250 mL infusion, 400 mg/m2 = 620 mg, Intravenous,  Once, 0 of 4 cycles oxaliplatin (ELOXATIN) 130 mg in dextrose 5 % 500 mL chemo infusion, 85 mg/m2 = 130 mg, Intravenous,  Once, 0 of 4 cycles fluorouracil (ADRUCIL) chemo injection 600 mg, 400 mg/m2 = 600 mg, Intravenous,  Once, 0 of 4 cycles fluorouracil (ADRUCIL) 3,700 mg in sodium chloride 0.9 % 76 mL chemo infusion, 2,400 mg/m2 = 3,700 mg, Intravenous, 1 Day/Dose, 0 of 4 cycles  for chemotherapy treatment.     09/07/2017 - 04/26/2018 Chemotherapy    The patient had palonosetron (ALOXI) injection 0.25 mg, 0.25 mg, Intravenous,  Once, 10 of 11 cycles Administration: 0.25 mg (09/07/2017), 0.25 mg  (09/28/2017), 0.25 mg (10/19/2017), 0.25 mg (11/16/2017), 0.25 mg (12/07/2017), 0.25 mg (12/28/2017), 0.25 mg (01/18/2018), 0.25 mg (02/16/2018), 0.25 mg (03/16/2018), 0.25 mg (04/06/2018) oxaliplatin (ELOXATIN) 150 mg in dextrose 5 % 500 mL chemo infusion, 160 mg (100 % of original dose 100 mg/m2), Intravenous,  Once, 10 of 11 cycles Dose modification: 100 mg/m2 (original dose 100 mg/m2, Cycle 1, Reason: Provider Judgment) Administration: 150 mg (09/07/2017), 150 mg (09/28/2017), 150 mg (10/19/2017), 150 mg (11/16/2017), 150 mg (12/07/2017), 150 mg (12/28/2017), 150 mg (01/18/2018), 150 mg (02/16/2018), 150 mg (03/16/2018), 150 mg (04/06/2018)  for chemotherapy treatment.     05/15/2018 -  Chemotherapy    The patient had gemcitabine (GEMZAR) 1,600 mg in sodium chloride 0.9 % 250 mL chemo infusion, 1,558 mg, Intravenous,  Once, 1 of 4 cycles Administration: 1,600 mg (05/15/2018)  for chemotherapy treatment.      ALLERGIES:  is allergic to no known allergies.  MEDICATIONS:  Current Outpatient Medications  Medication Sig Dispense Refill  . acetaminophen (TYLENOL) 500 MG tablet Take 1,000 mg by mouth every 8 (eight) hours as needed.    Marland Kitchen albuterol (PROVENTIL HFA;VENTOLIN HFA) 108 (90 Base) MCG/ACT inhaler Inhale 1-2 puffs into the lungs every 6 (six) hours as needed for wheezing or shortness  of breath. 1 Inhaler 6  . Ipratropium-Albuterol (COMBIVENT RESPIMAT) 20-100 MCG/ACT AERS respimat Inhale 1 puff into the lungs 4 (four) times daily. 1 Inhaler 6  . megestrol (MEGACE) 40 MG tablet Take 1 tablet (40 mg total) by mouth daily. 30 tablet 2  . Oxycodone HCl 10 MG TABS Take 1 tablet (10 mg total) by mouth 2 (two) times daily as needed. 60 tablet 0  . prochlorperazine (COMPAZINE) 10 MG tablet Take 1 tablet (10 mg total) by mouth every 6 (six) hours as needed for nausea or vomiting. 60 tablet 0  . traMADol (ULTRAM) 50 MG tablet Take 1 tablet (50 mg total) by mouth every 6 (six) hours as needed. 30 tablet 0  . XELODA  500 MG tablet TAKE 3 TABLETS (1,500 MG TOTAL) BY MOUTH 2 (TWO) TIMES DAILY AFTER A MEAL. TAKE FOR 14 DAYS ON, THEN 7 DAYS OFF. 84 tablet 2  . zolpidem (AMBIEN) 10 MG tablet Take 1 tablet (10 mg total) by mouth at bedtime as needed for up to 30 days for sleep. 30 tablet 1   No current facility-administered medications for this visit.     VITAL SIGNS: There were no vitals taken for this visit. There were no vitals filed for this visit.  Estimated body mass index is 14.96 kg/m as calculated from the following:   Height as of an earlier encounter on 05/22/18: 6' (1.829 m).   Weight as of an earlier encounter on 05/22/18: 110 lb 4.8 oz (50 kg).  LABS: CBC:    Component Value Date/Time   WBC 5.9 05/22/2018 0830   HGB 13.3 05/22/2018 0830   HCT 36.6 (L) 05/22/2018 0830   PLT 98 (L) 05/22/2018 0830   MCV 96.8 05/22/2018 0830   NEUTROABS 3.5 05/22/2018 0830   NEUTROABS 4 01/23/2014   LYMPHSABS 1.3 05/22/2018 0830   MONOABS 0.5 05/22/2018 0830   EOSABS 0.6 (H) 05/22/2018 0830   BASOSABS 0.0 05/22/2018 0830   Comprehensive Metabolic Panel:    Component Value Date/Time   NA 129 (L) 05/22/2018 0830   NA 139 01/23/2014   K 3.3 (L) 05/22/2018 0830   CL 98 05/22/2018 0830   CO2 24 05/22/2018 0830   BUN 10 05/22/2018 0830   BUN 5 01/23/2014   CREATININE 0.33 (L) 05/22/2018 0830   GLUCOSE 105 (H) 05/22/2018 0830   CALCIUM 8.7 (L) 05/22/2018 0830   AST 140 (H) 05/22/2018 0830   ALT 131 (H) 05/22/2018 0830   ALKPHOS 956 (H) 05/22/2018 0830   BILITOT 1.3 (H) 05/22/2018 0830   PROT 6.7 05/22/2018 0830   ALBUMIN 3.4 (L) 05/22/2018 0830    RADIOGRAPHIC STUDIES: Nm Pet Image Restag (ps) Skull Base To Thigh  Result Date: 05/05/2018 CLINICAL DATA:  Subsequent treatment strategy for pancreatic cancer. EXAM: NUCLEAR MEDICINE PET SKULL BASE TO THIGH TECHNIQUE: 6.5 mCi F-18 FDG was injected intravenously. Full-ring PET imaging was performed from the skull base to thigh after the radiotracer. CT  data was obtained and used for attenuation correction and anatomic localization. Fasting blood glucose: 89 mg/dl COMPARISON:  Abdomen and pelvis CT 04/03/2018.  PET-CT 04/12/2017 FINDINGS: Mediastinal blood pool activity: SUV max 2.2 NECK: No hypermetabolic lymph nodes in the neck. Incidental CT findings: none CHEST: No hypermetabolic mediastinal or hilar nodes. No suspicious pulmonary nodules on the CT scan. Low level uptake in the distal esophagus likely physiologic. Incidental CT findings: Right Port-A-Cath tip is positioned in the upper right atrium. Centrilobular and paraseptal emphysema noted. Chronic airway impaction  with scarring in the medial left lower lobe is similar to prior. Tiny pulmonary nodules identified on the recent diagnostic CT scan are not as well demonstrated on today's non breath hold CT scan acquired for attenuation correction. ABDOMEN/PELVIS: The patient's known pancreatic lesion is hypermetabolic with SUV max = 9.8 increased from 5.0 on previous PET-CT of 04/12/2017. as on prior PET imaging, the hypermetabolic activity encompasses the distal common bile duct stent. Focal hypermetabolic uptake is identified at the distal tip of the common duct stent, indeterminate. No evidence for hypermetabolic liver metastases. No hypermetabolic distant metastases in the abdomen or pelvis. Incidental CT findings: Intrahepatic biliary duct dilatation is similar to 04/03/2018. There is abdominal aortic atherosclerosis without aneurysm. SKELETON: No focal hypermetabolic activity to suggest skeletal metastasis. Incidental CT findings: No worrisome lytic or sclerotic osseous abnormality. IMPRESSION: 1. Hypermetabolism associated with the known pancreatic head mass has progressed in the interval since prior PET-CT. 2. No hypermetabolic liver metastases evident on today's exam. No hypermetabolic distant metastases identified. 3.  Emphysema. (ICD10-J43.9) 4.  Aortic Atherosclerois (ICD10-170.0) Electronically  Signed   By: Misty Stanley M.D.   On: 05/05/2018 14:13    PERFORMANCE STATUS (ECOG) : 1 - Symptomatic but completely ambulatory  Review of Systems As noted above. Otherwise, a complete review of systems is negative.  Physical Exam General: NAD, frail appearing, thin Cardiovascular: regular rate and rhythm Pulmonary: clear ant fields Abdomen: soft, nontender, + bowel sounds GU: no suprapubic tenderness Extremities: no edema, no joint deformities Skin: no rashes Neurological: Weakness but otherwise nonfocal  IMPRESSION: I met with patient today in the clinic.  I introduced palliative care services and attempted to establish therapeutic rapport.  We discussed patient's understanding of his current cancer and prognosis.  Patient says that he noticed that most patients with pancreatic cancer do not survive longer than 5 years.  Patient tells me that he has survived 2 years and expects to beat the odds.  He says that he is optimistic that treatment will cure his disease.  I explained that it is my understanding that patient has had disease progression on multiple lines of treatment and that he has essentially exhausted treatment options.  Patient states his goals are clearly still aligned with ongoing aggressive treatment.  Symptomatically, patient says his pain is reasonably controlled with oxycodone, which he takes at bedtime.  He denies any other distressing symptoms.    He says he is still functionally independent in the home.  He still drives.  He lives at home alone.  We discussed advance care plans.  I reviewed with him ACP documents, which he took home with him.  Patient says that he would want his sister to be his primary decision-maker.  His sister lives in [place] and it is unclear how much she knows about his current disease state.  Patient says that his friend [name] would be able to reach his sister if necessary.  We will follow-up with patient when he is next seen in the cancer  center to continue conversations regarding goals.  PLAN: Continue current scope of treatment Agree with oxycodone prn for pain ACP documents reviewed RTC in 1-2 weeks.    Patient expressed understanding and was in agreement with this plan. He also understands that He can call clinic at any time with any questions, concerns, or complaints.     Time Total: 30 minutes  Visit consisted of counseling and education dealing with the complex and emotionally intense issues of symptom management and palliative  care in the setting of serious and potentially life-threatening illness.Greater than 50%  of this time was spent counseling and coordinating care related to the above assessment and plan.  Signed by: Altha Harm, PhD, NP-C [phone] (Work Cell)

## 2018-05-22 NOTE — Progress Notes (Signed)
Patient is here today to follow up on his Malignant neoplasm of pancreas. Patient stated that he continues to have low back pain. Patient also wanted to mention that he wanted to mention that he has a rash on his chest, back, bilateral arms and upper thighs.

## 2018-05-23 LAB — CANCER ANTIGEN 19-9: CA 19-9: 956 U/mL — ABNORMAL HIGH (ref 0–35)

## 2018-05-29 ENCOUNTER — Inpatient Hospital Stay (HOSPITAL_BASED_OUTPATIENT_CLINIC_OR_DEPARTMENT_OTHER): Payer: Medicaid Other | Admitting: Hospice and Palliative Medicine

## 2018-05-29 ENCOUNTER — Inpatient Hospital Stay: Payer: Medicaid Other | Attending: Oncology

## 2018-05-29 ENCOUNTER — Inpatient Hospital Stay (HOSPITAL_BASED_OUTPATIENT_CLINIC_OR_DEPARTMENT_OTHER): Payer: Medicaid Other | Admitting: Oncology

## 2018-05-29 ENCOUNTER — Other Ambulatory Visit: Payer: Self-pay

## 2018-05-29 ENCOUNTER — Inpatient Hospital Stay: Payer: Medicaid Other

## 2018-05-29 VITALS — BP 117/77 | HR 91 | Temp 97.4°F | Resp 18 | Wt 107.0 lb

## 2018-05-29 DIAGNOSIS — T451X5A Adverse effect of antineoplastic and immunosuppressive drugs, initial encounter: Secondary | ICD-10-CM | POA: Diagnosis not present

## 2018-05-29 DIAGNOSIS — L271 Localized skin eruption due to drugs and medicaments taken internally: Secondary | ICD-10-CM | POA: Diagnosis not present

## 2018-05-29 DIAGNOSIS — E871 Hypo-osmolality and hyponatremia: Secondary | ICD-10-CM

## 2018-05-29 DIAGNOSIS — R748 Abnormal levels of other serum enzymes: Secondary | ICD-10-CM | POA: Insufficient documentation

## 2018-05-29 DIAGNOSIS — R978 Other abnormal tumor markers: Secondary | ICD-10-CM | POA: Diagnosis not present

## 2018-05-29 DIAGNOSIS — M25519 Pain in unspecified shoulder: Secondary | ICD-10-CM | POA: Diagnosis not present

## 2018-05-29 DIAGNOSIS — R0602 Shortness of breath: Secondary | ICD-10-CM | POA: Diagnosis not present

## 2018-05-29 DIAGNOSIS — C259 Malignant neoplasm of pancreas, unspecified: Secondary | ICD-10-CM

## 2018-05-29 DIAGNOSIS — R5382 Chronic fatigue, unspecified: Secondary | ICD-10-CM | POA: Diagnosis not present

## 2018-05-29 DIAGNOSIS — C786 Secondary malignant neoplasm of retroperitoneum and peritoneum: Secondary | ICD-10-CM | POA: Insufficient documentation

## 2018-05-29 DIAGNOSIS — R531 Weakness: Secondary | ICD-10-CM

## 2018-05-29 DIAGNOSIS — Z5111 Encounter for antineoplastic chemotherapy: Secondary | ICD-10-CM | POA: Diagnosis not present

## 2018-05-29 DIAGNOSIS — Z79899 Other long term (current) drug therapy: Secondary | ICD-10-CM | POA: Insufficient documentation

## 2018-05-29 DIAGNOSIS — D6959 Other secondary thrombocytopenia: Secondary | ICD-10-CM | POA: Diagnosis not present

## 2018-05-29 DIAGNOSIS — R600 Localized edema: Secondary | ICD-10-CM

## 2018-05-29 DIAGNOSIS — G4700 Insomnia, unspecified: Secondary | ICD-10-CM | POA: Diagnosis not present

## 2018-05-29 DIAGNOSIS — Z515 Encounter for palliative care: Secondary | ICD-10-CM

## 2018-05-29 DIAGNOSIS — R251 Tremor, unspecified: Secondary | ICD-10-CM | POA: Diagnosis not present

## 2018-05-29 DIAGNOSIS — F1190 Opioid use, unspecified, uncomplicated: Secondary | ICD-10-CM | POA: Insufficient documentation

## 2018-05-29 DIAGNOSIS — R630 Anorexia: Secondary | ICD-10-CM | POA: Diagnosis not present

## 2018-05-29 DIAGNOSIS — G8929 Other chronic pain: Secondary | ICD-10-CM | POA: Insufficient documentation

## 2018-05-29 LAB — CBC WITH DIFFERENTIAL/PLATELET
Abs Immature Granulocytes: 0.01 10*3/uL (ref 0.00–0.07)
Basophils Absolute: 0 10*3/uL (ref 0.0–0.1)
Basophils Relative: 0 %
Eosinophils Absolute: 0 10*3/uL (ref 0.0–0.5)
Eosinophils Relative: 0 %
HCT: 34.4 % — ABNORMAL LOW (ref 39.0–52.0)
Hemoglobin: 12.5 g/dL — ABNORMAL LOW (ref 13.0–17.0)
Immature Granulocytes: 0 %
Lymphocytes Relative: 30 %
Lymphs Abs: 1.2 10*3/uL (ref 0.7–4.0)
MCH: 35.4 pg — ABNORMAL HIGH (ref 26.0–34.0)
MCHC: 36.3 g/dL — AB (ref 30.0–36.0)
MCV: 97.5 fL (ref 80.0–100.0)
Monocytes Absolute: 0.5 10*3/uL (ref 0.1–1.0)
Monocytes Relative: 13 %
NRBC: 0 % (ref 0.0–0.2)
Neutro Abs: 2.2 10*3/uL (ref 1.7–7.7)
Neutrophils Relative %: 57 %
Platelets: 78 10*3/uL — ABNORMAL LOW (ref 150–400)
RBC: 3.53 MIL/uL — AB (ref 4.22–5.81)
RDW: 14.6 % (ref 11.5–15.5)
WBC: 3.9 10*3/uL — ABNORMAL LOW (ref 4.0–10.5)

## 2018-05-29 LAB — COMPREHENSIVE METABOLIC PANEL
ALT: 120 U/L — ABNORMAL HIGH (ref 0–44)
AST: 71 U/L — ABNORMAL HIGH (ref 15–41)
Albumin: 3.4 g/dL — ABNORMAL LOW (ref 3.5–5.0)
Alkaline Phosphatase: 754 U/L — ABNORMAL HIGH (ref 38–126)
Anion gap: 7 (ref 5–15)
BUN: 20 mg/dL (ref 6–20)
CHLORIDE: 101 mmol/L (ref 98–111)
CO2: 24 mmol/L (ref 22–32)
CREATININE: 0.42 mg/dL — AB (ref 0.61–1.24)
Calcium: 8.7 mg/dL — ABNORMAL LOW (ref 8.9–10.3)
GFR calc non Af Amer: >60 mL/min (ref >60)
Glucose, Bld: 111 mg/dL — ABNORMAL HIGH (ref 70–99)
Potassium: 3.3 mmol/L — ABNORMAL LOW (ref 3.5–5.1)
Sodium: 132 mmol/L — ABNORMAL LOW (ref 135–145)
Total Bilirubin: 1.1 mg/dL (ref 0.3–1.2)
Total Protein: 6.9 g/dL (ref 6.5–8.1)

## 2018-05-29 LAB — MAGNESIUM: Magnesium: 2 mg/dL (ref 1.7–2.4)

## 2018-05-29 MED ORDER — HEPARIN SOD (PORK) LOCK FLUSH 100 UNIT/ML IV SOLN
500.0000 [IU] | Freq: Once | INTRAVENOUS | Status: AC
  Administered 2018-05-29: 500 [IU] via INTRAVENOUS

## 2018-05-29 NOTE — Progress Notes (Signed)
Patient wanted to leave before being seen. Will reschedule appointment  No Charge Visit

## 2018-05-29 NOTE — Progress Notes (Signed)
Patient here today for follow up regarding pancreatic cancer. Patient reports rash and itching are much improved since last visit.

## 2018-05-29 NOTE — Progress Notes (Signed)
[place]  [phone] [phone]  ID: Benjamin Diaz. OB: 08-22-1960  MR#: [id number]  [id number]#:[id number]  Patient Care Team: Patient, [name] as PCP - General (General Practice) [name], MD as Consulting Physician (Oncology) [name], MD (Inactive) as Consulting Physician (Gastroenterology) [name], MD as Consulting Physician (Gastroenterology)  CHIEF COMPLAINT: Stage IV pancreatic cancer  INTERVAL HISTORY: Patient returns to clinic today for further evaluation and consideration of cycle 1, day 15 of single agent gemcitabine.  Patient's rash completely resolved with Medrol Dosepak.  He currently feels well.  His pain is well controlled on his current narcotic regimen. He has chronic weakness and fatigue that is unchanged. He has a poor appetite, but no weight loss.  He has no neurologic complaints.  He denies any recent fevers or illnesses.  He denies any chest pain, shortness of breath, cough, or hemoptysis.  He denies any nausea, vomiting, constipation, or diarrhea. He has no urinary complaints.  Patient offers no specific complaints today.  REVIEW OF SYSTEMS:   Review of Systems  Constitutional: Positive for malaise/fatigue. Negative for fever and weight loss.  Respiratory: Negative.  Negative for cough and shortness of breath.   Cardiovascular: Negative.  Negative for chest pain and leg swelling.  Gastrointestinal: Negative for abdominal pain, diarrhea, nausea and vomiting.  Genitourinary: Negative for dysuria, flank pain, frequency and urgency.  Musculoskeletal: Negative for back pain and joint pain.  Skin: Negative.  Negative for itching and rash.  Neurological: Positive for tingling, sensory change and weakness. Negative for tremors and focal weakness.  Psychiatric/Behavioral: The patient is not nervous/anxious and does not have insomnia.     As per HPI. Otherwise, a complete review of systems is negative.  PAST  MEDICAL HISTORY: Past Medical History:  Diagnosis Date  . Arthritis   . Asthma   . Atrial fibrillation ([place])   . Closed left ankle fracture age 49  . COPD (chronic obstructive pulmonary disease) ([place])   . Dysrhythmia   . Pancreatic cancer ([place]) 06/2016   Chemo tx's.   . Pneumonia 2011  . Right shoulder injury 09/27/2016  . Sciatica    left leg pinched nerve numb at times about once a year    PAST SURGICAL HISTORY: Past Surgical History:  Procedure Laterality Date  . BILIARY STENT PLACEMENT N/A 09/01/2016   Procedure: BILIARY STENT PLACEMENT;  Surgeon: [name], MD;  [facility]: [facility];  Service: Endoscopy;  Laterality: N/A;  . DIAGNOSTIC LAPAROSCOPIC LIVER BIOPSY  09/16/2016   Procedure: DIAGNOSTIC LAPAROSCOPIC LIVER BIOPSY;  Surgeon: [name], MD;  Location: [facility];  Service: General;;  . ERCP N/A 07/09/2016   Procedure: ENDOSCOPIC RETROGRADE CHOLANGIOPANCREATOGRAPHY (ERCP);  Surgeon: [name], MD;  Location: [facility];  Service: Endoscopy;  Laterality: N/A;  . ERCP N/A 09/01/2016   Procedure: ENDOSCOPIC RETROGRADE CHOLANGIOPANCREATOGRAPHY (ERCP);  Surgeon: [name], MD;  Location: [facility];  Service: Endoscopy;  Laterality: N/A;  . ERCP N/A 06/28/2017   Procedure: ENDOSCOPIC RETROGRADE CHOLANGIOPANCREATOGRAPHY (ERCP);  Surgeon: [name], MD;  Location: [facility] ENDOSCOPY;  Service: Endoscopy;  Laterality: N/A;  . ESOPHAGOGASTRODUODENOSCOPY (EGD) WITH PROPOFOL N/A 09/01/2016   Procedure: ESOPHAGOGASTRODUODENOSCOPY (EGD) WITH PROPOFOL;  Surgeon: [name], MD;  [facility];  Service: Gastroenterology;  Laterality: N/A;  . EUS N/A 09/01/2016   Procedure: ESOPHAGEAL ENDOSCOPIC ULTRASOUND (EUS) RADIAL;  Surgeon: [name], MD;  [facility];  Service: Endoscopy;  Laterality: N/A;  . FINE NEEDLE ASPIRATION N/A 09/01/2016  Procedure: FINE NEEDLE ASPIRATION (FNA) RADIAL;  Surgeon: [name], MD;  [facility];  Service:  Endoscopy;  Laterality: N/A;  . FOREIGN BODY REMOVAL N/A 09/01/2016   Procedure: FOREIGN BODY REMOVAL;  Surgeon: [name], MD;  [facility];  Service: Gastroenterology;  Laterality: N/A;  . KNEE SURGERY Right 2002   arthroscopy  . PORTA CATH INSERTION N/A 09/20/2017   Procedure: PORTA CATH INSERTION;  Surgeon: [name], MD;  Location: [facility] LAB;  Service: Cardiovascular;  Laterality: N/A;  . SHOULDER SURGERY Right 2002    FAMILY HISTORY: Family History  Problem Relation Age of Onset  . Heart disease Father     ADVANCED DIRECTIVES (Y/N):  N  HEALTH MAINTENANCE: Social History   Tobacco Use  . Smoking status: Current Every Day Smoker    Packs/day: 1.00    Years: 40.00    Pack years: 40.00    Types: Cigarettes  . Smokeless tobacco: Never Used  Substance Use Topics  . Alcohol use: Yes    Alcohol/week: 7.0 standard drinks    Types: 7 Cans of beer per week    Comment: last drink 5 weeeks ago  april 2018  . Drug use: No     Colonoscopy:  PAP:  Bone density:  Lipid panel:  Allergies  Allergen Reactions  . No Known Allergies     Current Outpatient Medications  Medication Sig Dispense Refill  . acetaminophen (TYLENOL) 500 MG tablet Take 1,000 mg by mouth every 8 (eight) hours as needed.    Marland Kitchen albuterol (PROVENTIL HFA;VENTOLIN HFA) 108 (90 Base) MCG/ACT inhaler Inhale 1-2 puffs into the lungs every 6 (six) hours as needed for wheezing or shortness of breath. 1 Inhaler 6  . Ipratropium-Albuterol (COMBIVENT RESPIMAT) 20-100 MCG/ACT AERS respimat Inhale 1 puff into the lungs 4 (four) times daily. 1 Inhaler 6  . megestrol (MEGACE) 40 MG tablet Take 1 tablet (40 mg total) by mouth daily. 30 tablet 2  . Oxycodone HCl 10 MG TABS Take 1 tablet (10 mg total) by mouth 2 (two) times daily as needed. 60 tablet 0  . predniSONE (STERAPRED UNI-PAK 21 TAB) 10 MG (21) TBPK tablet Taper as directed. 21 tablet 0  . prochlorperazine (COMPAZINE) 10 MG tablet Take  1 tablet (10 mg total) by mouth every 6 (six) hours as needed for nausea or vomiting. 60 tablet 0  . traMADol (ULTRAM) 50 MG tablet Take 1 tablet (50 mg total) by mouth every 6 (six) hours as needed. 30 tablet 0  . zolpidem (AMBIEN) 10 MG tablet Take 1 tablet (10 mg total) by mouth at bedtime as needed for up to 30 days for sleep. 30 tablet 1   No current facility-administered medications for this visit.    OBJECTIVE: Vitals:   05/29/18 0901  BP: 117/77  Pulse: 91  Resp: 18  Temp: (!) 97.4 F (36.3 C)     Body mass index is 14.51 kg/m.    ECOG FS:1 - Symptomatic but completely ambulatory  General: Thin, no acute distress. Eyes: Pink conjunctiva, anicteric sclera. HEENT: Normocephalic, moist mucous membranes, clear oropharnyx. Lungs: Clear to auscultation bilaterally. Heart: Regular rate and rhythm. No rubs, murmurs, or gallops. Abdomen: Soft, nontender, nondistended. No organomegaly noted, normoactive bowel sounds. Musculoskeletal: No edema, cyanosis, or clubbing. Neuro: Alert, answering all questions appropriately. Cranial nerves grossly intact. Skin: No rashes or petechiae noted. Psych: Normal affect.  LAB RESULTS:  Lab Results  Component Value Date   NA 132 (L) 05/29/2018  K 3.3 (L) 05/29/2018   CL 101 05/29/2018   CO2 24 05/29/2018   GLUCOSE 111 (H) 05/29/2018   BUN 20 05/29/2018   CREATININE 0.42 (L) 05/29/2018   CALCIUM 8.7 (L) 05/29/2018   PROT 6.9 05/29/2018   ALBUMIN 3.4 (L) 05/29/2018   AST 71 (H) 05/29/2018   ALT 120 (H) 05/29/2018   ALKPHOS 754 (H) 05/29/2018   BILITOT 1.1 05/29/2018   GFRNONAA >60 05/29/2018   GFRAA >60 05/29/2018    Lab Results  Component Value Date   WBC 3.9 (L) 05/29/2018   NEUTROABS 2.2 05/29/2018   HGB 12.5 (L) 05/29/2018   HCT 34.4 (L) 05/29/2018   MCV 97.5 05/29/2018   PLT 78 (L) 05/29/2018      STUDIES: Nm Pet Image Restag (ps) Skull Base To Thigh  Result Date: 05/05/2018 CLINICAL DATA:  Subsequent treatment  strategy for pancreatic cancer. EXAM: NUCLEAR MEDICINE PET SKULL BASE TO THIGH TECHNIQUE: 6.5 mCi F-18 FDG was injected intravenously. Full-ring PET imaging was performed from the skull base to thigh after the radiotracer. CT data was obtained and used for attenuation correction and anatomic localization. Fasting blood glucose: 89 mg/dl COMPARISON:  Abdomen and pelvis CT 04/03/2018.  PET-CT 04/12/2017 FINDINGS: Mediastinal blood pool activity: SUV max 2.2 NECK: No hypermetabolic lymph nodes in the neck. Incidental CT findings: none CHEST: No hypermetabolic mediastinal or hilar nodes. No suspicious pulmonary nodules on the CT scan. Low level uptake in the distal esophagus likely physiologic. Incidental CT findings: Right Port-A-Cath tip is positioned in the upper right atrium. Centrilobular and paraseptal emphysema noted. Chronic airway impaction with scarring in the medial left lower lobe is similar to prior. Tiny pulmonary nodules identified on the recent diagnostic CT scan are not as well demonstrated on today's non breath hold CT scan acquired for attenuation correction. ABDOMEN/PELVIS: The patient's known pancreatic lesion is hypermetabolic with SUV max = 9.8 increased from 5.0 on previous PET-CT of 04/12/2017. as on prior PET imaging, the hypermetabolic activity encompasses the distal common bile duct stent. Focal hypermetabolic uptake is identified at the distal tip of the common duct stent, indeterminate. No evidence for hypermetabolic liver metastases. No hypermetabolic distant metastases in the abdomen or pelvis. Incidental CT findings: Intrahepatic biliary duct dilatation is similar to 04/03/2018. There is abdominal aortic atherosclerosis without aneurysm. SKELETON: No focal hypermetabolic activity to suggest skeletal metastasis. Incidental CT findings: No worrisome lytic or sclerotic osseous abnormality. IMPRESSION: 1. Hypermetabolism associated with the known pancreatic head mass has progressed in the  interval since prior PET-CT. 2. No hypermetabolic liver metastases evident on today's exam. No hypermetabolic distant metastases identified. 3.  Emphysema. (ICD10-J43.9) 4.  Aortic Atherosclerois (ICD10-170.0) Electronically Signed   By: Misty Stanley M.D.   On: 05/05/2018 14:13    ASSESSMENT: Stage IV pancreatic cancer  PLAN:   1. Stage IV pancreatic cancer: Imaging, pathology, and Op note reviewed independently confirming stage IV disease with multiple peritoneal implants.  PET scan results from May 05, 2018 reviewed independently with clear progression of disease.  Patient's CA-19 9 also increased to 1395, but after 1 treatment has decreased to 956. Patient wishes to continue treatment, but expressed understanding that his treatment options are limited.  Patient previously stated he has no interest in hospice and if chemotherapy were not an option he would like to pursue surgery or ablation.  We did discuss that this is likely not possible but could be considered.  Despite physician instructions, patient continued to take his capecitabine.  He  is now thrombocytopenic, therefore will have to delay cycle 1, day 15 of treatment.  Return to clinic in 1 week for reconsideration of treatment.   2.  Elevated liver enzymes: Chronic and unchanged.  AST and ALT remain mildly increased, but improved.  Patient has not been evaluated by GI secondary to transportation issues.  Proceed with treatment as above. 3. Hyponatremia: Chronic and relatively unchanged.  Sodium is 132 today.   4. Peripheral edema: Continue elevation nightly.  Lower extremity ultrasound did not reveal DVT.   5.  Elevated bilirubin: Resolved. 6.  Pain: Chronic and unchanged.  Continue oxycodone at night tramadol during the day as needed.   7.  Poor appetite: Patient has Megace prescribed, but is unclear his compliance.   8.  Shortness of breath: Chronic and unchanged.  Patient does not complain of this today.  Continue inhalers as  prescribed. 9.  Shoulder pain/tremor: Patient was previously given a referral to orthopedics as he is received injections in the past that improved his symptoms. 10.  Insomnia: Continue Ambien as needed. 11.  Rash: Secondary gemcitabine.  Improved with Medrol Dosepak.  Continue premedications as ordered for chemotherapy.  Patient may require low-dose daily steroids in the future.   12.  Thrombocytopenia: Secondary gemcitabine.  Patient was also taking capecitabine despite instructions to discontinue.  Delay treatment as above.  Patient expressed understanding and was in agreement with this plan. He also understands that He can call clinic at any time with any questions, concerns, or complaints.   Cancer Staging Pancreatic cancer [facility]) Staging form: Exocrine Pancreas, AJCC 8th Edition - Clinical stage from 09/25/2016: Stage IV (cTX, cNX, pM1) - Signed by Lloyd Huger, MD on 09/25/2016   Lloyd Huger, MD   05/30/2018 2:00 PM

## 2018-06-02 ENCOUNTER — Other Ambulatory Visit: Payer: Self-pay

## 2018-06-02 MED ORDER — ZOLPIDEM TARTRATE 10 MG PO TABS: 10.0000 mg | ORAL_TABLET | Freq: Every evening | ORAL | 1 refills | Status: DC | PRN

## 2018-06-04 NOTE — Progress Notes (Signed)
[place]  [phone] [phone]  ID: Benjamin Diaz. OB: 1960/05/28  MR#: [id number]  JAS#:[id number]  Patient Care Team: Patient, [name] as PCP - General (General Practice) [name], MD as Consulting Physician (Oncology) [name], MD (Inactive) as Consulting Physician (Gastroenterology) [name], MD as Consulting Physician (Gastroenterology)  CHIEF COMPLAINT: Stage IV pancreatic cancer  INTERVAL HISTORY: Patient returns to clinic today for further evaluation and reconsideration of cycle 1, day 15 of single agent gemcitabine.  His rash has now completely resolved. He currently feels well.  His pain is well controlled on his current narcotic regimen. He has chronic weakness and fatigue that is unchanged. He has a poor appetite, but no weight loss.  He has no neurologic complaints.  He denies any recent fevers or illnesses.  He denies any chest pain, shortness of breath, cough, or hemoptysis.  He denies any nausea, vomiting, constipation, or diarrhea. He has no urinary complaints.  Patient offers no specific complaints today.  REVIEW OF SYSTEMS:   Review of Systems  Constitutional: Positive for malaise/fatigue. Negative for fever and weight loss.  Respiratory: Negative.  Negative for cough and shortness of breath.   Cardiovascular: Negative.  Negative for chest pain and leg swelling.  Gastrointestinal: Negative for abdominal pain, diarrhea, nausea and vomiting.  Genitourinary: Negative for dysuria, flank pain, frequency and urgency.  Musculoskeletal: Negative for back pain and joint pain.  Skin: Negative.  Negative for itching and rash.  Neurological: Positive for weakness. Negative for tingling, tremors, sensory change and focal weakness.  Psychiatric/Behavioral: The patient is not nervous/anxious and does not have insomnia.     As per HPI. Otherwise, a complete review of systems is negative.  PAST MEDICAL  HISTORY: Past Medical History:  Diagnosis Date  . Arthritis   . Asthma   . Atrial fibrillation ([place])   . Closed left ankle fracture age 65  . COPD (chronic obstructive pulmonary disease) ([place])   . Dysrhythmia   . Pancreatic cancer ([place]) 06/2016   Chemo tx's.   . Pneumonia 2011  . Right shoulder injury 09/27/2016  . Sciatica    left leg pinched nerve numb at times about once a year    PAST SURGICAL HISTORY: Past Surgical History:  Procedure Laterality Date  . BILIARY STENT PLACEMENT N/A 09/01/2016   Procedure: BILIARY STENT PLACEMENT;  Surgeon: [name], MD;  [facility];  Service: Endoscopy;  Laterality: N/A;  . DIAGNOSTIC LAPAROSCOPIC LIVER BIOPSY  09/16/2016   Procedure: DIAGNOSTIC LAPAROSCOPIC LIVER BIOPSY;  Surgeon: [name], MD;  Location: [facility];  Service: General;;  . ERCP N/A 07/09/2016   Procedure: ENDOSCOPIC RETROGRADE CHOLANGIOPANCREATOGRAPHY (ERCP);  Surgeon: [name], MD;  Location: [facility];  Service: Endoscopy;  Laterality: N/A;  . ERCP N/A 09/01/2016   Procedure: ENDOSCOPIC RETROGRADE CHOLANGIOPANCREATOGRAPHY (ERCP);  Surgeon: [name], MD;  Location: [facility];  Service: Endoscopy;  Laterality: N/A;  . ERCP N/A 06/28/2017   Procedure: ENDOSCOPIC RETROGRADE CHOLANGIOPANCREATOGRAPHY (ERCP);  Surgeon: [name], MD;  Location: [facility] ENDOSCOPY;  Service: Endoscopy;  Laterality: N/A;  . ESOPHAGOGASTRODUODENOSCOPY (EGD) WITH PROPOFOL N/A 09/01/2016   Procedure: ESOPHAGOGASTRODUODENOSCOPY (EGD) WITH PROPOFOL;  Surgeon: [name], MD;  [facility];  Service: Gastroenterology;  Laterality: N/A;  . EUS N/A 09/01/2016   Procedure: ESOPHAGEAL ENDOSCOPIC ULTRASOUND (EUS) RADIAL;  Surgeon: [name], MD;  [facility];  Service: Endoscopy;  Laterality: N/A;  . FINE NEEDLE ASPIRATION N/A 09/01/2016   Procedure: FINE NEEDLE  ASPIRATION (FNA) RADIAL;  Surgeon: [name], MD;  [facility];  Service: Endoscopy;   Laterality: N/A;  . FOREIGN BODY REMOVAL N/A 09/01/2016   Procedure: FOREIGN BODY REMOVAL;  Surgeon: [name], MD;  [facility];  Service: Gastroenterology;  Laterality: N/A;  . KNEE SURGERY Right 2002   arthroscopy  . PORTA CATH INSERTION N/A 09/20/2017   Procedure: PORTA CATH INSERTION;  Surgeon: [name], MD;  Location: [place] CV LAB;  Service: Cardiovascular;  Laterality: N/A;  . SHOULDER SURGERY Right 2002    FAMILY HISTORY: Family History  Problem Relation Age of Onset  . Heart disease Father     ADVANCED DIRECTIVES (Y/N):  N  HEALTH MAINTENANCE: Social History   Tobacco Use  . Smoking status: Current Every Day Smoker    Packs/day: 1.00    Years: 40.00    Pack years: 40.00    Types: Cigarettes  . Smokeless tobacco: Never Used  Substance Use Topics  . Alcohol use: Yes    Alcohol/week: 7.0 standard drinks    Types: 7 Cans of beer per week    Comment: last drink 5 weeeks ago  april 2018  . Drug use: No     Colonoscopy:  PAP:  Bone density:  Lipid panel:  Allergies  Allergen Reactions  . No Known Allergies     Current Outpatient Medications  Medication Sig Dispense Refill  . acetaminophen (TYLENOL) 500 MG tablet Take 1,000 mg by mouth every 8 (eight) hours as needed.    Marland Kitchen albuterol (PROVENTIL HFA;VENTOLIN HFA) 108 (90 Base) MCG/ACT inhaler Inhale 1-2 puffs into the lungs every 6 (six) hours as needed for wheezing or shortness of breath. 1 Inhaler 6  . Ipratropium-Albuterol (COMBIVENT RESPIMAT) 20-100 MCG/ACT AERS respimat Inhale 1 puff into the lungs 4 (four) times daily. 1 Inhaler 6  . megestrol (MEGACE) 40 MG tablet Take 1 tablet (40 mg total) by mouth daily. 30 tablet 2  . Oxycodone HCl 10 MG TABS Take 1 tablet (10 mg total) by mouth 2 (two) times daily as needed. 60 tablet 0  . predniSONE (STERAPRED UNI-PAK 21 TAB) 10 MG (21) TBPK tablet Taper as directed. 21 tablet 0  . prochlorperazine (COMPAZINE) 10 MG tablet Take 1 tablet (10  mg total) by mouth every 6 (six) hours as needed for nausea or vomiting. 60 tablet 0  . traMADol (ULTRAM) 50 MG tablet Take 1 tablet (50 mg total) by mouth every 6 (six) hours as needed. 30 tablet 0  . zolpidem (AMBIEN) 10 MG tablet Take 1 tablet (10 mg total) by mouth at bedtime as needed for up to 30 days for sleep. 30 tablet 1   No current facility-administered medications for this visit.    Facility-Administered Medications Ordered in Other Visits  Medication Dose Route Frequency Provider Last Rate Last Dose  . famotidine (PEPCID) IVPB 20 mg premix  20 mg Intravenous Once Lloyd Huger, MD 200 mL/hr at 06/05/18 1438 20 mg at 06/05/18 1438  . gemcitabine (GEMZAR) 1,600 mg in sodium chloride 0.9 % 250 mL chemo infusion  1,600 mg Intravenous Once Lloyd Huger, MD      . heparin lock flush 100 unit/mL  500 Units Intracatheter Once PRN Lloyd Huger, MD       OBJECTIVE: Vitals:   06/05/18 1340  BP: 111/72  Pulse: 91  Temp: 97.8 F (36.6 C)     Body mass index is 15.05 kg/m.    ECOG FS:1 - Symptomatic  but completely ambulatory  General: Thin, no acute distress. Eyes: Pink conjunctiva, anicteric sclera. HEENT: Normocephalic, moist mucous membranes. Lungs: Clear to auscultation bilaterally. Heart: Regular rate and rhythm. No rubs, murmurs, or gallops. Abdomen: Soft, nontender, nondistended. No organomegaly noted, normoactive bowel sounds. Musculoskeletal: No edema, cyanosis, or clubbing. Neuro: Alert, answering all questions appropriately. Cranial nerves grossly intact. Skin: No rashes or petechiae noted. Psych: Normal affect.  LAB RESULTS:  Lab Results  Component Value Date   NA 131 (L) 06/05/2018   K 3.3 (L) 06/05/2018   CL 100 06/05/2018   CO2 24 06/05/2018   GLUCOSE 109 (H) 06/05/2018   BUN 11 06/05/2018   CREATININE 0.30 (L) 06/05/2018   CALCIUM 8.5 (L) 06/05/2018   PROT 6.5 06/05/2018   ALBUMIN 3.4 (L) 06/05/2018   AST 82 (H) 06/05/2018   ALT 132  (H) 06/05/2018   ALKPHOS 822 (H) 06/05/2018   BILITOT 0.8 06/05/2018   GFRNONAA >60 06/05/2018   GFRAA >60 06/05/2018    Lab Results  Component Value Date   WBC 9.3 06/05/2018   NEUTROABS 5.6 06/05/2018   HGB 12.1 (L) 06/05/2018   HCT 33.7 (L) 06/05/2018   MCV 99.4 06/05/2018   PLT 371 06/05/2018      STUDIES: No results found.  ASSESSMENT: Stage IV pancreatic cancer  PLAN:   1. Stage IV pancreatic cancer: Imaging, pathology, and Op note reviewed independently confirming stage IV disease with multiple peritoneal implants.  PET scan results from May 05, 2018 reviewed independently with clear progression of disease.  Patient's CA-19 9 also increased to 1395, but after 1 treatment has decreased to 956.  Today's result is pending.  Patient wishes to continue treatment, but expressed understanding that his treatment options are limited.  Patient previously stated he has no interest in hospice and if chemotherapy were not an option he would like to pursue surgery or ablation.  We did discuss that this is likely not possible but could be considered.  Patient has now discontinued capecitabine.  Proceed with cycle 1, day 15 of single agent gemcitabine.  Return to clinic in 2 weeks for further evaluation and consideration of cycle 2, day 1.   2.  Elevated liver enzymes: Chronic and unchanged.  AST and ALT remain mildly increased, but unchanged.  Patient has not been evaluated by GI secondary to transportation issues.  Proceed with treatment as above. 3. Hyponatremia: Chronic and relatively unchanged.  Sodium is 131 today. 4. Peripheral edema: Continue elevation nightly.  Lower extremity ultrasound did not reveal DVT.   5.  Elevated bilirubin: Resolved. 6.  Pain: Chronic and unchanged.  Continue oxycodone at night tramadol during the day as needed.   7.  Poor appetite: Patient has Megace prescribed, but is unclear his compliance.   8.  Shortness of breath: Chronic and unchanged.  Patient  does not complain of this today.  Continue inhalers as prescribed. 9.  Shoulder pain/tremor: Patient was previously given a referral to orthopedics as he is received injections in the past that improved his symptoms. 10.  Insomnia: Continue Ambien as needed. 11.  Rash: Secondary gemcitabine.  Improved with Medrol Dosepak.  Continue premedications as ordered for chemotherapy.  Patient may require low-dose daily steroids in the future.   12.  Thrombocytopenia: Resolved.  Proceed with gemcitabine as above.  Patient has now discontinued capecitabine.  Patient expressed understanding and was in agreement with this plan. He also understands that He can call clinic at any time with any questions, concerns,  or complaints.   Cancer Staging Pancreatic cancer [facility]) Staging form: Exocrine Pancreas, AJCC 8th Edition - Clinical stage from 09/25/2016: Stage IV (cTX, cNX, pM1) - Signed by Lloyd Huger, MD on 09/25/2016   Lloyd Huger, MD   06/05/2018 2:47 PM

## 2018-06-05 ENCOUNTER — Inpatient Hospital Stay: Payer: Medicaid Other

## 2018-06-05 ENCOUNTER — Other Ambulatory Visit: Payer: Self-pay

## 2018-06-05 ENCOUNTER — Inpatient Hospital Stay: Payer: Medicaid Other | Admitting: Hospice and Palliative Medicine

## 2018-06-05 ENCOUNTER — Encounter: Payer: Self-pay | Admitting: Oncology

## 2018-06-05 ENCOUNTER — Inpatient Hospital Stay (HOSPITAL_BASED_OUTPATIENT_CLINIC_OR_DEPARTMENT_OTHER): Payer: Medicaid Other | Admitting: Oncology

## 2018-06-05 VITALS — BP 111/72 | HR 91 | Temp 97.8°F | Ht 72.0 in | Wt 111.0 lb

## 2018-06-05 DIAGNOSIS — G8929 Other chronic pain: Secondary | ICD-10-CM | POA: Diagnosis not present

## 2018-06-05 DIAGNOSIS — R251 Tremor, unspecified: Secondary | ICD-10-CM

## 2018-06-05 DIAGNOSIS — R740 Nonspecific elevation of levels of transaminase and lactic acid dehydrogenase [LDH]: Secondary | ICD-10-CM | POA: Diagnosis not present

## 2018-06-05 DIAGNOSIS — E871 Hypo-osmolality and hyponatremia: Secondary | ICD-10-CM | POA: Diagnosis not present

## 2018-06-05 DIAGNOSIS — M25519 Pain in unspecified shoulder: Secondary | ICD-10-CM

## 2018-06-05 DIAGNOSIS — G4700 Insomnia, unspecified: Secondary | ICD-10-CM

## 2018-06-05 DIAGNOSIS — F17210 Nicotine dependence, cigarettes, uncomplicated: Secondary | ICD-10-CM

## 2018-06-05 DIAGNOSIS — R600 Localized edema: Secondary | ICD-10-CM

## 2018-06-05 DIAGNOSIS — C259 Malignant neoplasm of pancreas, unspecified: Secondary | ICD-10-CM | POA: Diagnosis not present

## 2018-06-05 DIAGNOSIS — R630 Anorexia: Secondary | ICD-10-CM

## 2018-06-05 DIAGNOSIS — Z5111 Encounter for antineoplastic chemotherapy: Secondary | ICD-10-CM | POA: Diagnosis not present

## 2018-06-05 DIAGNOSIS — R5383 Other fatigue: Secondary | ICD-10-CM

## 2018-06-05 DIAGNOSIS — R5382 Chronic fatigue, unspecified: Secondary | ICD-10-CM

## 2018-06-05 LAB — CBC WITH DIFFERENTIAL/PLATELET
Abs Immature Granulocytes: 0.06 10*3/uL (ref 0.00–0.07)
Basophils Absolute: 0 10*3/uL (ref 0.0–0.1)
Basophils Relative: 0 %
Eosinophils Absolute: 0.1 10*3/uL (ref 0.0–0.5)
Eosinophils Relative: 1 %
HCT: 33.7 % — ABNORMAL LOW (ref 39.0–52.0)
Hemoglobin: 12.1 g/dL — ABNORMAL LOW (ref 13.0–17.0)
Immature Granulocytes: 1 %
Lymphocytes Relative: 20 %
Lymphs Abs: 1.9 10*3/uL (ref 0.7–4.0)
MCH: 35.7 pg — ABNORMAL HIGH (ref 26.0–34.0)
MCHC: 35.9 g/dL (ref 30.0–36.0)
MCV: 99.4 fL (ref 80.0–100.0)
MONOS PCT: 18 %
Monocytes Absolute: 1.7 10*3/uL — ABNORMAL HIGH (ref 0.1–1.0)
NEUTROS PCT: 60 %
NRBC: 0 % (ref 0.0–0.2)
Neutro Abs: 5.6 10*3/uL (ref 1.7–7.7)
Platelets: 371 10*3/uL (ref 150–400)
RBC: 3.39 MIL/uL — ABNORMAL LOW (ref 4.22–5.81)
RDW: 17.2 % — AB (ref 11.5–15.5)
WBC: 9.3 10*3/uL (ref 4.0–10.5)

## 2018-06-05 LAB — COMPREHENSIVE METABOLIC PANEL
ALT: 132 U/L — ABNORMAL HIGH (ref 0–44)
AST: 82 U/L — ABNORMAL HIGH (ref 15–41)
Albumin: 3.4 g/dL — ABNORMAL LOW (ref 3.5–5.0)
Alkaline Phosphatase: 822 U/L — ABNORMAL HIGH (ref 38–126)
Anion gap: 7 (ref 5–15)
BUN: 11 mg/dL (ref 6–20)
CO2: 24 mmol/L (ref 22–32)
Calcium: 8.5 mg/dL — ABNORMAL LOW (ref 8.9–10.3)
Chloride: 100 mmol/L (ref 98–111)
Creatinine, Ser: 0.3 mg/dL — ABNORMAL LOW (ref 0.61–1.24)
GFR calc Af Amer: >60 mL/min (ref >60)
GFR calc non Af Amer: >60 mL/min (ref >60)
Glucose, Bld: 109 mg/dL — ABNORMAL HIGH (ref 70–99)
Potassium: 3.3 mmol/L — ABNORMAL LOW (ref 3.5–5.1)
Sodium: 131 mmol/L — ABNORMAL LOW (ref 135–145)
Total Bilirubin: 0.8 mg/dL (ref 0.3–1.2)
Total Protein: 6.5 g/dL (ref 6.5–8.1)

## 2018-06-05 LAB — MAGNESIUM: Magnesium: 1.8 mg/dL (ref 1.7–2.4)

## 2018-06-05 MED ORDER — SODIUM CHLORIDE 0.9 % IV SOLN
Freq: Once | INTRAVENOUS | Status: AC
  Administered 2018-06-05: 14:00:00 via INTRAVENOUS
  Filled 2018-06-05: qty 250

## 2018-06-05 MED ORDER — SODIUM CHLORIDE 0.9 % IV SOLN
1600.0000 mg | Freq: Once | INTRAVENOUS | Status: AC
  Administered 2018-06-05: 1600 mg via INTRAVENOUS
  Filled 2018-06-05: qty 26.3

## 2018-06-05 MED ORDER — METHYLPREDNISOLONE SODIUM SUCC 125 MG IJ SOLR
40.0000 mg | Freq: Once | INTRAMUSCULAR | Status: AC
  Administered 2018-06-05: 40 mg via INTRAVENOUS
  Filled 2018-06-05: qty 2

## 2018-06-05 MED ORDER — HEPARIN SOD (PORK) LOCK FLUSH 100 UNIT/ML IV SOLN
500.0000 [IU] | Freq: Once | INTRAVENOUS | Status: AC | PRN
  Administered 2018-06-05: 500 [IU]
  Filled 2018-06-05: qty 5

## 2018-06-05 MED ORDER — FAMOTIDINE IN NACL 20-0.9 MG/50ML-% IV SOLN
20.0000 mg | Freq: Once | INTRAVENOUS | Status: AC
  Administered 2018-06-05: 20 mg via INTRAVENOUS
  Filled 2018-06-05: qty 50

## 2018-06-05 MED ORDER — PROCHLORPERAZINE MALEATE 10 MG PO TABS
10.0000 mg | ORAL_TABLET | Freq: Once | ORAL | Status: AC
  Administered 2018-06-05: 10 mg via ORAL
  Filled 2018-06-05: qty 1

## 2018-06-05 MED ORDER — DIPHENHYDRAMINE HCL 50 MG/ML IJ SOLN
25.0000 mg | Freq: Once | INTRAMUSCULAR | Status: AC
  Administered 2018-06-05: 25 mg via INTRAVENOUS
  Filled 2018-06-05: qty 1

## 2018-06-05 NOTE — Progress Notes (Signed)
Patient is here today to follow up on his Malignant neoplasm of pancreas. Patient stated that he had no concerns today.

## 2018-06-07 ENCOUNTER — Other Ambulatory Visit: Payer: Self-pay | Admitting: *Deleted

## 2018-06-07 MED ORDER — ZOLPIDEM TARTRATE 10 MG PO TABS: 10.0000 mg | ORAL_TABLET | Freq: Every evening | ORAL | 1 refills | Status: DC | PRN

## 2018-06-18 NOTE — Progress Notes (Signed)
[place]  [phone] [phone]  ID: Benjamin Diaz. OB: December 05, 1960  MR#: [id number]  SNK#:[id number]  Patient Care Team: Patient, [name] as PCP - General (General Practice) [name], MD as Consulting Physician (Oncology) [name], MD (Inactive) as Consulting Physician (Gastroenterology) [name], MD as Consulting Physician (Gastroenterology)  CHIEF COMPLAINT: Stage IV pancreatic cancer  INTERVAL HISTORY: Patient returns to clinic today for further evaluation and consideration of cycle 2, day 1 of single agent gemcitabine.  He ran out of Ambien and is having some insomnia, but otherwise feels well. His pain is well controlled on his current narcotic regimen. He has chronic weakness and fatigue that is unchanged. He has a poor appetite, but no weight loss.  He has no neurologic complaints.  He denies any recent fevers or illnesses.  He denies any chest pain, shortness of breath, cough, or hemoptysis.  He denies any nausea, vomiting, constipation, or diarrhea. He has no urinary complaints.  Patient offers no further specific complaints today.  REVIEW OF SYSTEMS:   Review of Systems  Constitutional: Positive for malaise/fatigue. Negative for fever and weight loss.  Respiratory: Negative.  Negative for cough and shortness of breath.   Cardiovascular: Negative.  Negative for chest pain and leg swelling.  Gastrointestinal: Negative.  Negative for abdominal pain, diarrhea, nausea and vomiting.  Genitourinary: Negative.  Negative for dysuria, flank pain, frequency and urgency.  Musculoskeletal: Negative.  Negative for back pain and joint pain.  Skin: Negative.  Negative for itching and rash.  Neurological: Positive for weakness. Negative for tingling, tremors, sensory change and focal weakness.  Psychiatric/Behavioral: The patient has insomnia. The patient is not nervous/anxious.     As per HPI. Otherwise, a complete review of  systems is negative.  PAST MEDICAL HISTORY: Past Medical History:  Diagnosis Date  . Arthritis   . Asthma   . Atrial fibrillation ([place])   . Closed left ankle fracture age 79  . COPD (chronic obstructive pulmonary disease) ([place])   . Dysrhythmia   . Pancreatic cancer ([place]) 06/2016   Chemo tx's.   . Pneumonia 2011  . Right shoulder injury 09/27/2016  . Sciatica    left leg pinched nerve numb at times about once a year    PAST SURGICAL HISTORY: Past Surgical History:  Procedure Laterality Date  . BILIARY STENT PLACEMENT N/A 09/01/2016   Procedure: BILIARY STENT PLACEMENT;  Surgeon: [name], MD;  [facility];  Service: Endoscopy;  Laterality: N/A;  . DIAGNOSTIC LAPAROSCOPIC LIVER BIOPSY  09/16/2016   Procedure: DIAGNOSTIC LAPAROSCOPIC LIVER BIOPSY;  Surgeon: [name], MD;  Location: [place];  Service: General;;  . ERCP N/A 07/09/2016   Procedure: ENDOSCOPIC RETROGRADE CHOLANGIOPANCREATOGRAPHY (ERCP);  Surgeon: [name], MD;  Location: [facility];  Service: Endoscopy;  Laterality: N/A;  . ERCP N/A 09/01/2016   Procedure: ENDOSCOPIC RETROGRADE CHOLANGIOPANCREATOGRAPHY (ERCP);  Surgeon: [name], MD;  Location: [facility];  Service: Endoscopy;  Laterality: N/A;  . ERCP N/A 06/28/2017   Procedure: ENDOSCOPIC RETROGRADE CHOLANGIOPANCREATOGRAPHY (ERCP);  Surgeon: [name], MD;  Location: [facility] ENDOSCOPY;  Service: Endoscopy;  Laterality: N/A;  . ESOPHAGOGASTRODUODENOSCOPY (EGD) WITH PROPOFOL N/A 09/01/2016   Procedure: ESOPHAGOGASTRODUODENOSCOPY (EGD) WITH PROPOFOL;  Surgeon: [name], MD;  [facility];  Service: Gastroenterology;  Laterality: N/A;  . EUS N/A 09/01/2016   Procedure: ESOPHAGEAL ENDOSCOPIC ULTRASOUND (EUS) RADIAL;  Surgeon: [name], MD;  [facility];  Service: Endoscopy;  Laterality: N/A;  . FINE  NEEDLE ASPIRATION N/A 09/01/2016   Procedure: FINE NEEDLE ASPIRATION (FNA) RADIAL;  Surgeon: [name], MD;  Location:  [facility];  Service: Endoscopy;  Laterality: N/A;  . FOREIGN BODY REMOVAL N/A 09/01/2016   Procedure: FOREIGN BODY REMOVAL;  Surgeon: [name], MD;  [facility];  Service: Gastroenterology;  Laterality: N/A;  . KNEE SURGERY Right 2002   arthroscopy  . PORTA CATH INSERTION N/A 09/20/2017   Procedure: PORTA CATH INSERTION;  Surgeon: [name], MD;  Location: [place] CV [facility];  Service: Cardiovascular;  Laterality: N/A;  . SHOULDER SURGERY Right 2002    FAMILY HISTORY: Family History  Problem Relation Age of Onset  . Heart disease Father     ADVANCED DIRECTIVES (Y/N):  N  HEALTH MAINTENANCE: Social History   Tobacco Use  . Smoking status: Current Every Day Smoker    Packs/day: 1.00    Years: 40.00    Pack years: 40.00    Types: Cigarettes  . Smokeless tobacco: Never Used  Substance Use Topics  . Alcohol use: Yes    Alcohol/week: 7.0 standard drinks    Types: 7 Cans of beer per week    Comment: last drink 5 weeeks ago  april 2018  . Drug use: No     Colonoscopy:  PAP:  Bone density:  Lipid panel:  Allergies  Allergen Reactions  . No Known Allergies     Current Outpatient Medications  Medication Sig Dispense Refill  . acetaminophen (TYLENOL) 500 MG tablet Take 1,000 mg by mouth every 8 (eight) hours as needed.    Marland Kitchen albuterol (PROVENTIL HFA;VENTOLIN HFA) 108 (90 Base) MCG/ACT inhaler Inhale 1-2 puffs into the lungs every 6 (six) hours as needed for wheezing or shortness of breath. 1 Inhaler 6  . Ipratropium-Albuterol (COMBIVENT RESPIMAT) 20-100 MCG/ACT AERS respimat Inhale 1 puff into the lungs 4 (four) times daily. 1 Inhaler 6  . megestrol (MEGACE) 40 MG tablet Take 1 tablet (40 mg total) by mouth daily. 30 tablet 2  . Oxycodone HCl 10 MG TABS Take 1 tablet (10 mg total) by mouth 2 (two) times daily as needed. 60 tablet 0  . prochlorperazine (COMPAZINE) 10 MG tablet Take 1 tablet (10 mg total) by mouth every 6 (six) hours as needed for nausea  or vomiting. 60 tablet 0  . traMADol (ULTRAM) 50 MG tablet Take 1 tablet (50 mg total) by mouth every 6 (six) hours as needed. 30 tablet 0  . zolpidem (AMBIEN) 10 MG tablet Take 1 tablet (10 mg total) by mouth at bedtime as needed for up to 30 days for sleep. 30 tablet 1   No current facility-administered medications for this visit.    OBJECTIVE: Vitals:   06/19/18 1004  BP: 114/74  Pulse: 82  Temp: (!) 96.9 F (36.1 C)     Body mass index is 15.19 kg/m.    ECOG FS:1 - Symptomatic but completely ambulatory  General: Thin, no acute distress. Eyes: Pink conjunctiva, anicteric sclera. HEENT: Normocephalic, moist mucous membranes. Lungs: Clear to auscultation bilaterally. Heart: Regular rate and rhythm. No rubs, murmurs, or gallops. Abdomen: Soft, nontender, nondistended. No organomegaly noted, normoactive bowel sounds. Musculoskeletal: No edema, cyanosis, or clubbing. Neuro: Alert, answering all questions appropriately. Cranial nerves grossly intact. Skin: No rashes or petechiae noted. Psych: Normal affect.  LAB RESULTS:  Lab Results  Component Value Date   NA 133 (L) 06/19/2018   K 3.8 06/19/2018   CL 103 06/19/2018   CO2 23 06/19/2018  GLUCOSE 99 06/19/2018   BUN 11 06/19/2018   CREATININE <0.30 (L) 06/19/2018   CALCIUM 9.1 06/19/2018   PROT 7.4 06/19/2018   ALBUMIN 3.8 06/19/2018   AST 99 (H) 06/19/2018   ALT 149 (H) 06/19/2018   ALKPHOS 769 (H) 06/19/2018   BILITOT 1.2 06/19/2018   GFRNONAA NOT CALCULATED 06/19/2018   GFRAA NOT CALCULATED 06/19/2018    Lab Results  Component Value Date   WBC 10.4 06/19/2018   NEUTROABS 7.5 06/19/2018   HGB 13.5 06/19/2018   HCT 38.4 (L) 06/19/2018   MCV 99.7 06/19/2018   PLT 210 06/19/2018      STUDIES: No results found.  ASSESSMENT: Stage IV pancreatic cancer  PLAN:   1. Stage IV pancreatic cancer: Imaging, pathology, and Op note reviewed independently confirming stage IV disease with multiple peritoneal  implants.  PET scan results from May 05, 2018 reviewed independently with clear progression of disease.  Patient's CA-19 9 also increased to 1395, but is now trending down and is 628.  Patient wishes to continue treatment, but expressed understanding that his treatment options are limited.  Patient previously stated he has no interest in hospice and if chemotherapy were not an option he would like to pursue surgery or ablation.  We did discuss that this is likely not possible.  Patient has now discontinued capecitabine.  Proceed with cycle 2, day 1 of single agent gemcitabine.  Return to clinic in 1 week for further evaluation and consideration of cycle 2, day 8.   2.  Elevated liver enzymes: Chronic and unchanged.  AST and ALT remain mildly increased, but essentially unchanged.  Patient has not been evaluated by GI secondary to transportation issues.  Proceed with treatment as above. 3. Hyponatremia: Chronic and relatively unchanged.  Sodium is 133 today. 4. Peripheral edema: Continue elevation nightly.  Lower extremity ultrasound did not reveal DVT.   5.  Elevated bilirubin: Resolved. 6.  Pain: Chronic and unchanged.  Continue oxycodone at night tramadol during the day as needed.   7.  Poor appetite: Patient has Megace prescribed, but is unclear his compliance.   8.  Shortness of breath: Chronic and unchanged.  Patient does not complain of this today.  Continue inhalers as prescribed. 9.  Shoulder pain/tremor: Patient was previously given a referral to orthopedics as he is received injections in the past that improved his symptoms. 10.  Insomnia: Patient has been given a refill of his Ambien. 11.  Rash: Secondary gemcitabine.  Improved with Medrol Dosepak.  Continue premedications as ordered for chemotherapy.  Patient may require low-dose daily steroids in the future.   12.  Thrombocytopenia: Resolved.  Proceed with gemcitabine as above.  Patient has now discontinued capecitabine.  Patient  expressed understanding and was in agreement with this plan. He also understands that He can call clinic at any time with any questions, concerns, or complaints.   Cancer Staging Pancreatic cancer [facility]) Staging form: Exocrine Pancreas, AJCC 8th Edition - Clinical stage from 09/25/2016: Stage IV (cTX, cNX, pM1) - Signed by Lloyd Huger, MD on 09/25/2016   Lloyd Huger, MD   06/20/2018 6:31 AM

## 2018-06-19 ENCOUNTER — Inpatient Hospital Stay (HOSPITAL_BASED_OUTPATIENT_CLINIC_OR_DEPARTMENT_OTHER): Payer: Medicaid Other | Admitting: Oncology

## 2018-06-19 ENCOUNTER — Inpatient Hospital Stay (HOSPITAL_BASED_OUTPATIENT_CLINIC_OR_DEPARTMENT_OTHER): Payer: Medicaid Other | Admitting: Hospice and Palliative Medicine

## 2018-06-19 ENCOUNTER — Inpatient Hospital Stay: Payer: Medicaid Other

## 2018-06-19 ENCOUNTER — Other Ambulatory Visit: Payer: Self-pay

## 2018-06-19 VITALS — BP 114/74 | HR 82 | Temp 96.9°F | Wt 112.0 lb

## 2018-06-19 DIAGNOSIS — R251 Tremor, unspecified: Secondary | ICD-10-CM

## 2018-06-19 DIAGNOSIS — G4701 Insomnia due to medical condition: Secondary | ICD-10-CM | POA: Diagnosis not present

## 2018-06-19 DIAGNOSIS — Z515 Encounter for palliative care: Secondary | ICD-10-CM

## 2018-06-19 DIAGNOSIS — G4700 Insomnia, unspecified: Secondary | ICD-10-CM

## 2018-06-19 DIAGNOSIS — R0602 Shortness of breath: Secondary | ICD-10-CM

## 2018-06-19 DIAGNOSIS — F1190 Opioid use, unspecified, uncomplicated: Secondary | ICD-10-CM

## 2018-06-19 DIAGNOSIS — Z79899 Other long term (current) drug therapy: Secondary | ICD-10-CM

## 2018-06-19 DIAGNOSIS — M25519 Pain in unspecified shoulder: Secondary | ICD-10-CM

## 2018-06-19 DIAGNOSIS — Z5111 Encounter for antineoplastic chemotherapy: Secondary | ICD-10-CM | POA: Diagnosis not present

## 2018-06-19 DIAGNOSIS — L271 Localized skin eruption due to drugs and medicaments taken internally: Secondary | ICD-10-CM

## 2018-06-19 DIAGNOSIS — G893 Neoplasm related pain (acute) (chronic): Secondary | ICD-10-CM | POA: Diagnosis not present

## 2018-06-19 DIAGNOSIS — R600 Localized edema: Secondary | ICD-10-CM

## 2018-06-19 DIAGNOSIS — C786 Secondary malignant neoplasm of retroperitoneum and peritoneum: Secondary | ICD-10-CM

## 2018-06-19 DIAGNOSIS — R748 Abnormal levels of other serum enzymes: Secondary | ICD-10-CM

## 2018-06-19 DIAGNOSIS — C259 Malignant neoplasm of pancreas, unspecified: Secondary | ICD-10-CM

## 2018-06-19 DIAGNOSIS — E871 Hypo-osmolality and hyponatremia: Secondary | ICD-10-CM

## 2018-06-19 DIAGNOSIS — R630 Anorexia: Secondary | ICD-10-CM

## 2018-06-19 DIAGNOSIS — G8929 Other chronic pain: Secondary | ICD-10-CM

## 2018-06-19 LAB — CBC WITH DIFFERENTIAL/PLATELET
Abs Immature Granulocytes: 0.07 10*3/uL (ref 0.00–0.07)
Basophils Absolute: 0.1 10*3/uL (ref 0.0–0.1)
Basophils Relative: 1 %
EOS ABS: 0.3 10*3/uL (ref 0.0–0.5)
EOS PCT: 3 %
HEMATOCRIT: 38.4 % — AB (ref 39.0–52.0)
Hemoglobin: 13.5 g/dL (ref 13.0–17.0)
Immature Granulocytes: 1 %
Lymphocytes Relative: 13 %
Lymphs Abs: 1.4 10*3/uL (ref 0.7–4.0)
MCH: 35.1 pg — ABNORMAL HIGH (ref 26.0–34.0)
MCHC: 35.2 g/dL (ref 30.0–36.0)
MCV: 99.7 fL (ref 80.0–100.0)
Monocytes Absolute: 1.1 10*3/uL — ABNORMAL HIGH (ref 0.1–1.0)
Monocytes Relative: 10 %
Neutro Abs: 7.5 10*3/uL (ref 1.7–7.7)
Neutrophils Relative %: 72 %
Platelets: 210 10*3/uL (ref 150–400)
RBC: 3.85 MIL/uL — ABNORMAL LOW (ref 4.22–5.81)
RDW: 17.3 % — AB (ref 11.5–15.5)
WBC: 10.4 10*3/uL (ref 4.0–10.5)
nRBC: 0 % (ref 0.0–0.2)

## 2018-06-19 LAB — COMPREHENSIVE METABOLIC PANEL
ALT: 149 U/L — ABNORMAL HIGH (ref 0–44)
AST: 99 U/L — ABNORMAL HIGH (ref 15–41)
Albumin: 3.8 g/dL (ref 3.5–5.0)
Alkaline Phosphatase: 769 U/L — ABNORMAL HIGH (ref 38–126)
Anion gap: 7 (ref 5–15)
BUN: 11 mg/dL (ref 6–20)
CALCIUM: 9.1 mg/dL (ref 8.9–10.3)
CHLORIDE: 103 mmol/L (ref 98–111)
CO2: 23 mmol/L (ref 22–32)
Creatinine, Ser: <0.3 mg/dL — ABNORMAL LOW (ref 0.61–1.24)
Glucose, Bld: 99 mg/dL (ref 70–99)
Potassium: 3.8 mmol/L (ref 3.5–5.1)
Sodium: 133 mmol/L — ABNORMAL LOW (ref 135–145)
Total Bilirubin: 1.2 mg/dL (ref 0.3–1.2)
Total Protein: 7.4 g/dL (ref 6.5–8.1)

## 2018-06-19 LAB — MAGNESIUM: Magnesium: 1.9 mg/dL (ref 1.7–2.4)

## 2018-06-19 MED ORDER — METHYLPREDNISOLONE SODIUM SUCC 125 MG IJ SOLR
40.0000 mg | Freq: Once | INTRAMUSCULAR | Status: AC
  Administered 2018-06-19: 40 mg via INTRAVENOUS
  Filled 2018-06-19: qty 2

## 2018-06-19 MED ORDER — SODIUM CHLORIDE 0.9 % IV SOLN
1600.0000 mg | Freq: Once | INTRAVENOUS | Status: AC
  Administered 2018-06-19: 1600 mg via INTRAVENOUS
  Filled 2018-06-19: qty 26.3

## 2018-06-19 MED ORDER — FAMOTIDINE IN NACL 20-0.9 MG/50ML-% IV SOLN: 20.0000 mg | Freq: Once | INTRAVENOUS | Status: DC

## 2018-06-19 MED ORDER — PROCHLORPERAZINE MALEATE 10 MG PO TABS
10.0000 mg | ORAL_TABLET | Freq: Once | ORAL | Status: AC
  Administered 2018-06-19: 10 mg via ORAL
  Filled 2018-06-19: qty 1

## 2018-06-19 MED ORDER — HEPARIN SOD (PORK) LOCK FLUSH 100 UNIT/ML IV SOLN
500.0000 [IU] | Freq: Once | INTRAVENOUS | Status: AC
  Administered 2018-06-19: 500 [IU] via INTRAVENOUS
  Filled 2018-06-19: qty 5

## 2018-06-19 MED ORDER — SODIUM CHLORIDE 0.9% FLUSH
10.0000 mL | Freq: Once | INTRAVENOUS | Status: DC
  Filled 2018-06-19: qty 10

## 2018-06-19 MED ORDER — SODIUM CHLORIDE 0.9 % IV SOLN
Freq: Once | INTRAVENOUS | Status: AC
  Administered 2018-06-19: 12:00:00 via INTRAVENOUS
  Filled 2018-06-19: qty 250

## 2018-06-19 MED ORDER — SODIUM CHLORIDE 0.9 % IV SOLN
Freq: Once | INTRAVENOUS | Status: AC
  Administered 2018-06-19: 12:00:00 via INTRAVENOUS
  Filled 2018-06-19: qty 100

## 2018-06-19 MED ORDER — DIPHENHYDRAMINE HCL 50 MG/ML IJ SOLN
25.0000 mg | Freq: Once | INTRAMUSCULAR | Status: AC
  Administered 2018-06-19: 25 mg via INTRAVENOUS
  Filled 2018-06-19: qty 1

## 2018-06-19 NOTE — Progress Notes (Signed)
Patient has low back pain that is 2/10 today.

## 2018-06-19 NOTE — Progress Notes (Signed)
[place]  Telephone:([phone] [phone]   Name: Benjamin Diaz. Date: 06/19/2018 MRN: [id number]  DOB: [birth year]  Patient Care Team: Patient, [name] as PCP - General (General Practice) [name], MD as Consulting Physician (Oncology) [name], MD (Inactive) as Consulting Physician (Gastroenterology) [name], MD as Consulting Physician (Gastroenterology)    REASON FOR CONSULTATION: Palliative Care consult requested for this 58 y.o. male with multiple medical problems including stage IV pancreatic cancer currently being treated on gemcitabine.  PET scan from May 05, 2018 revealed disease progression.  Patient also had elevated CA 19-9 was also elevated.  Hospice was discussed but patient opted to continue treatment.  However, treatment options are felt to be limited.  Patient was referred to palliative care to continue discussions regarding goals.  SOCIAL HISTORY:     reports that he has been smoking cigarettes. He has a 40.00 pack-year smoking history. He has never used smokeless tobacco. He reports current alcohol use of about 7.0 standard drinks of alcohol per week. He reports that he does not use drugs.   Patient was born in [place].  His father was in the [employer] and patient later moved to [place] where he lived until 2006.  Patient moved to [facility] after he divorced.  He has 3 children, all of whom live in [place].  Patient has a former significant other, Benjamin Diaz, who is his primary support system here.  Patient lives at home alone.  He used to drive a forklift.  ADVANCE DIRECTIVES:  Does not have  CODE STATUS: Full code  PAST MEDICAL HISTORY: Past Medical History:  Diagnosis Date   Arthritis    Asthma    Atrial fibrillation ([facility])    Closed left ankle fracture age 26   COPD (chronic obstructive pulmonary disease) ([place])    Dysrhythmia    Pancreatic cancer ([place])  06/2016   Chemo tx's.    Pneumonia 2011   Right shoulder injury 09/27/2016   Sciatica    left leg pinched nerve numb at times about once a year    PAST SURGICAL HISTORY:  Past Surgical History:  Procedure Laterality Date   BILIARY STENT PLACEMENT N/A 09/01/2016   Procedure: BILIARY STENT PLACEMENT;  Surgeon: [name], MD;  [facility];  Service: Endoscopy;  Laterality: N/A;   DIAGNOSTIC LAPAROSCOPIC LIVER BIOPSY  09/16/2016   Procedure: DIAGNOSTIC LAPAROSCOPIC LIVER BIOPSY;  Surgeon: [name], MD;  Location: [place];  Service: General;;   ERCP N/A 07/09/2016   Procedure: ENDOSCOPIC RETROGRADE CHOLANGIOPANCREATOGRAPHY (ERCP);  Surgeon: [name], MD;  Location: [facility];  Service: Endoscopy;  Laterality: N/A;   ERCP N/A 09/01/2016   Procedure: ENDOSCOPIC RETROGRADE CHOLANGIOPANCREATOGRAPHY (ERCP);  Surgeon: [name], MD;  Location: [facility];  Service: Endoscopy;  Laterality: N/A;   ERCP N/A 06/28/2017   Procedure: ENDOSCOPIC RETROGRADE CHOLANGIOPANCREATOGRAPHY (ERCP);  Surgeon: [name], MD;  [facility];  Service: Endoscopy;  Laterality: N/A;   ESOPHAGOGASTRODUODENOSCOPY (EGD) WITH PROPOFOL N/A 09/01/2016   Procedure: ESOPHAGOGASTRODUODENOSCOPY (EGD) WITH PROPOFOL;  Surgeon: [name], MD;  [facility];  Service: Gastroenterology;  Laterality: N/A;   EUS N/A 09/01/2016   Procedure: ESOPHAGEAL ENDOSCOPIC ULTRASOUND (EUS) RADIAL;  Surgeon: [name], MD;  [facility];  Service: Endoscopy;  Laterality: N/A;   FINE NEEDLE ASPIRATION N/A 09/01/2016   Procedure: FINE NEEDLE ASPIRATION (FNA) RADIAL;  Surgeon: [name], MD;  [facility];  Service: Endoscopy;  Laterality:  N/A;   FOREIGN BODY REMOVAL N/A 09/01/2016   Procedure: FOREIGN BODY REMOVAL;  Surgeon: [name], MD;  [facility];  Service: Gastroenterology;  Laterality: N/A;   KNEE SURGERY Right 2002   arthroscopy   PORTA CATH  INSERTION N/A 09/20/2017   Procedure: PORTA CATH INSERTION;  Surgeon: [name], MD;  Location: [place] [facility];  Service: Cardiovascular;  Laterality: N/A;   SHOULDER SURGERY Right 2002    HEMATOLOGY/ONCOLOGY HISTORY:    Pancreatic cancer ([place])   09/20/2016 Initial Diagnosis    Pancreatic cancer ([place])    07/22/2017 - 09/06/2017 Chemotherapy    The patient had palonosetron (ALOXI) injection 0.25 mg, 0.25 mg, Intravenous,  Once, 2 of 6 cycles Administration: 0.25 mg (07/27/2017), 0.25 mg (08/17/2017) oxaliplatin (ELOXATIN) 150 mg in dextrose 5 % 500 mL chemo infusion, 155 mg (100 % of original dose 100 mg/m2), Intravenous,  Once, 2 of 6 cycles Dose modification: 100 mg/m2 (original dose 100 mg/m2, Cycle 1, Reason: Provider Judgment) Administration: 150 mg (07/27/2017), 150 mg (08/17/2017)  for chemotherapy treatment.     08/20/2017 - 09/06/2017 Chemotherapy    The patient had palonosetron (ALOXI) injection 0.25 mg, 0.25 mg, Intravenous,  Once, 0 of 4 cycles irinotecan (CAMPTOSAR) 240 mg in dextrose 5 % 500 mL chemo infusion, 150 mg/m2 = 240 mg (100 % of original dose 150 mg/m2), Intravenous,  Once, 0 of 4 cycles Dose modification: 150 mg/m2 (original dose 150 mg/m2, Cycle 1, Reason: Provider Judgment) leucovorin 620 mg in dextrose 5 % 250 mL infusion, 400 mg/m2 = 620 mg, Intravenous,  Once, 0 of 4 cycles oxaliplatin (ELOXATIN) 130 mg in dextrose 5 % 500 mL chemo infusion, 85 mg/m2 = 130 mg, Intravenous,  Once, 0 of 4 cycles fluorouracil (ADRUCIL) chemo injection 600 mg, 400 mg/m2 = 600 mg, Intravenous,  Once, 0 of 4 cycles fluorouracil (ADRUCIL) 3,700 mg in sodium chloride 0.9 % 76 mL chemo infusion, 2,400 mg/m2 = 3,700 mg, Intravenous, 1 Day/Dose, 0 of 4 cycles  for chemotherapy treatment.     09/07/2017 - 04/26/2018 Chemotherapy    The patient had palonosetron (ALOXI) injection 0.25 mg, 0.25 mg, Intravenous,  Once, 10 of 11 cycles Administration: 0.25 mg (09/07/2017), 0.25 mg  (09/28/2017), 0.25 mg (10/19/2017), 0.25 mg (11/16/2017), 0.25 mg (12/07/2017), 0.25 mg (12/28/2017), 0.25 mg (01/18/2018), 0.25 mg (02/16/2018), 0.25 mg (03/16/2018), 0.25 mg (04/06/2018) oxaliplatin (ELOXATIN) 150 mg in dextrose 5 % 500 mL chemo infusion, 160 mg (100 % of original dose 100 mg/m2), Intravenous,  Once, 10 of 11 cycles Dose modification: 100 mg/m2 (original dose 100 mg/m2, Cycle 1, Reason: Provider Judgment) Administration: 150 mg (09/07/2017), 150 mg (09/28/2017), 150 mg (10/19/2017), 150 mg (11/16/2017), 150 mg (12/07/2017), 150 mg (12/28/2017), 150 mg (01/18/2018), 150 mg (02/16/2018), 150 mg (03/16/2018), 150 mg (04/06/2018)  for chemotherapy treatment.     05/15/2018 -  Chemotherapy    The patient had gemcitabine (GEMZAR) 1,600 mg in sodium chloride 0.9 % 250 mL chemo infusion, 1,558 mg, Intravenous,  Once, 2 of 4 cycles Administration: 1,600 mg (05/15/2018), 1,600 mg (05/22/2018), 1,600 mg (06/05/2018)  for chemotherapy treatment.      ALLERGIES:  is allergic to no known allergies.  MEDICATIONS:  Current Outpatient Medications  Medication Sig Dispense Refill   acetaminophen (TYLENOL) 500 MG tablet Take 1,000 mg by mouth every 8 (eight) hours as needed.     albuterol (PROVENTIL HFA;VENTOLIN HFA) 108 (90 Base) MCG/ACT inhaler Inhale 1-2 puffs into the lungs every 6 (six) hours  as needed for wheezing or shortness of breath. 1 Inhaler 6   Ipratropium-Albuterol (COMBIVENT RESPIMAT) 20-100 MCG/ACT AERS respimat Inhale 1 puff into the lungs 4 (four) times daily. 1 Inhaler 6   megestrol (MEGACE) 40 MG tablet Take 1 tablet (40 mg total) by mouth daily. 30 tablet 2   Oxycodone HCl 10 MG TABS Take 1 tablet (10 mg total) by mouth 2 (two) times daily as needed. 60 tablet 0   prochlorperazine (COMPAZINE) 10 MG tablet Take 1 tablet (10 mg total) by mouth every 6 (six) hours as needed for nausea or vomiting. 60 tablet 0   traMADol (ULTRAM) 50 MG tablet Take 1 tablet (50 mg total) by mouth every 6  (six) hours as needed. 30 tablet 0   zolpidem (AMBIEN) 10 MG tablet Take 1 tablet (10 mg total) by mouth at bedtime as needed for up to 30 days for sleep. 30 tablet 1   No current facility-administered medications for this visit.    Facility-Administered Medications Ordered in Other Visits  Medication Dose Route Frequency Provider Last Rate Last Dose   gemcitabine (GEMZAR) 1,600 mg in sodium chloride 0.9 % 250 mL chemo infusion  1,600 mg Intravenous Once Lloyd Huger, MD 584 mL/hr at 06/19/18 1231 1,600 mg at 06/19/18 1231   heparin lock flush 100 unit/mL  500 Units Intravenous Once Lloyd Huger, MD       sodium chloride flush (NS) 0.9 % injection 10 mL  10 mL Intravenous Once Lloyd Huger, MD        VITAL SIGNS: There were no vitals taken for this visit. There were no vitals filed for this visit.  Estimated body mass index is 15.19 kg/m as calculated from the following:   Height as of 06/05/18: 6' (1.829 m).   Weight as of an earlier encounter on 06/19/18: 112 lb (50.8 kg).  LABS: CBC:    Component Value Date/Time   WBC 10.4 06/19/2018 0930   HGB 13.5 06/19/2018 0930   HCT 38.4 (L) 06/19/2018 0930   PLT 210 06/19/2018 0930   MCV 99.7 06/19/2018 0930   NEUTROABS 7.5 06/19/2018 0930   NEUTROABS 4 01/23/2014   LYMPHSABS 1.4 06/19/2018 0930   MONOABS 1.1 (H) 06/19/2018 0930   EOSABS 0.3 06/19/2018 0930   BASOSABS 0.1 06/19/2018 0930   Comprehensive Metabolic Panel:    Component Value Date/Time   NA 133 (L) 06/19/2018 0930   NA 139 01/23/2014   K 3.8 06/19/2018 0930   CL 103 06/19/2018 0930   CO2 23 06/19/2018 0930   BUN 11 06/19/2018 0930   BUN 5 01/23/2014   CREATININE <0.30 (L) 06/19/2018 0930   GLUCOSE 99 06/19/2018 0930   CALCIUM 9.1 06/19/2018 0930   AST 99 (H) 06/19/2018 0930   ALT 149 (H) 06/19/2018 0930   ALKPHOS 769 (H) 06/19/2018 0930   BILITOT 1.2 06/19/2018 0930   PROT 7.4 06/19/2018 0930   ALBUMIN 3.8 06/19/2018 0930     RADIOGRAPHIC STUDIES: No results found.  PERFORMANCE STATUS (ECOG) : 1 - Symptomatic but completely ambulatory  Review of Systems As noted above. Otherwise, a complete review of systems is negative.  Physical Exam General: NAD, frail appearing, thin Cardiovascular: regular rate and rhythm Pulmonary: clear ant fields Abdomen: soft, nontender, + bowel sounds GU: no suprapubic tenderness Extremities: no edema, no joint deformities Skin: no rashes Neurological: Weakness but otherwise nonfocal  IMPRESSION: I met with patient today in the clinic.  Patient was seen while he was  receiving an infusion.   Patient reports doing well without any acute changes or concerns today. Symptomatically, he says pain is well controlled. We discussed his oral intake. He was prescribed Megace but does not feel he needs it. Weight low but stable at 112lbs.   Patient says he has insomnia. He is taking Ambien prn.   We have previously discussed ACP. He would want his sister to participate in decision making if needed but does not have a HCPOA/living will. Will benefit from MOST form at next clinic visit.   PLAN: Continue current scope of treatment Continue oxycodone prn for pain ACP documents reviewed/MOST form at next clinic visit RTC in 1 month  Patient expressed understanding and was in agreement with this plan. He also understands that He can call clinic at any time with any questions, concerns, or complaints.     Time Total: 15 minutes  Visit consisted of counseling and education dealing with the complex and emotionally intense issues of symptom management and palliative care in the setting of serious and potentially life-threatening illness.Greater than 50%  of this time was spent counseling and coordinating care related to the above assessment and plan.  Signed by: Altha Harm, PhD, NP-C [phone] (Work Cell)

## 2018-06-20 LAB — CANCER ANTIGEN 19-9: CA 19-9: 628 U/mL — ABNORMAL HIGH (ref 0–35)

## 2018-06-23 ENCOUNTER — Other Ambulatory Visit: Payer: Self-pay

## 2018-06-25 NOTE — Progress Notes (Signed)
Benjamin Diaz  Telephone:([phone] [phone]  ID: Benjamin Diaz. OB: [birth year]  MR#: [id number]  SHF#:[id number]  Patient Care Team: Patient, [name] as PCP - General (General Practice) [name], MD as Consulting Physician (Oncology) [name], MD (Inactive) as Consulting Physician (Gastroenterology) [name], MD as Consulting Physician (Gastroenterology)  CHIEF COMPLAINT: Stage IV pancreatic cancer  INTERVAL HISTORY: Patient returns to clinic today for further evaluation and consideration of cycle 2, day 8 of single agent gemcitabine.  He is tolerating his treatments well without significant side effects.  He does not report any further rash.  His insomnia has improved since his Ambien was refilled.  He denies any pain is currently not taking any further narcotics. He has chronic weakness and fatigue that is unchanged. He has a poor appetite, but no weight loss.  He has no neurologic complaints.  He denies any recent fevers or illnesses.  He denies any chest pain, shortness of breath, cough, or hemoptysis.  He denies any nausea, vomiting, constipation, or diarrhea. He has no urinary complaints.  Patient offers no specific complaints today.  REVIEW OF SYSTEMS:   Review of Systems  Constitutional: Positive for malaise/fatigue. Negative for fever and weight loss.  Respiratory: Negative.  Negative for cough and shortness of breath.   Cardiovascular: Negative.  Negative for chest pain and leg swelling.  Gastrointestinal: Negative.  Negative for abdominal pain, diarrhea, nausea and vomiting.  Genitourinary: Negative.  Negative for dysuria, flank pain, frequency and urgency.  Musculoskeletal: Negative.  Negative for back pain and joint pain.  Skin: Negative.  Negative for itching and rash.  Neurological: Positive for weakness. Negative for tingling, tremors, sensory change and focal weakness.  Psychiatric/Behavioral: Negative.  The patient  is not nervous/anxious and does not have insomnia.     As per HPI. Otherwise, a complete review of systems is negative.  PAST MEDICAL HISTORY: Past Medical History:  Diagnosis Date  . Arthritis   . Asthma   . Atrial fibrillation ([place])   . Closed left ankle fracture age 14  . COPD (chronic obstructive pulmonary disease) ([place])   . Dysrhythmia   . Pancreatic cancer ([place]) 06/2016   Chemo tx's.   . Pneumonia 2011  . Right shoulder injury 09/27/2016  . Sciatica    left leg pinched nerve numb at times about once a year    PAST SURGICAL HISTORY: Past Surgical History:  Procedure Laterality Date  . BILIARY STENT PLACEMENT N/A 09/01/2016   Procedure: BILIARY STENT PLACEMENT;  Surgeon: [name], MD;  Location: [facility];  Service: Endoscopy;  Laterality: N/A;  . DIAGNOSTIC LAPAROSCOPIC LIVER BIOPSY  09/16/2016   Procedure: DIAGNOSTIC LAPAROSCOPIC LIVER BIOPSY;  Surgeon: [name], MD;  Location: [facility];  Service: General;;  . ERCP N/A 07/09/2016   Procedure: ENDOSCOPIC RETROGRADE CHOLANGIOPANCREATOGRAPHY (ERCP);  Surgeon: [name], MD;  Location: [facility];  Service: Endoscopy;  Laterality: N/A;  . ERCP N/A 09/01/2016   Procedure: ENDOSCOPIC RETROGRADE CHOLANGIOPANCREATOGRAPHY (ERCP);  Surgeon: [name], MD;  Location: [facility];  Service: Endoscopy;  Laterality: N/A;  . ERCP N/A 06/28/2017   Procedure: ENDOSCOPIC RETROGRADE CHOLANGIOPANCREATOGRAPHY (ERCP);  Surgeon: [name], MD;  Location: [name] ENDOSCOPY;  Service: Endoscopy;  Laterality: N/A;  . ESOPHAGOGASTRODUODENOSCOPY (EGD) WITH PROPOFOL N/A 09/01/2016   Procedure: ESOPHAGOGASTRODUODENOSCOPY (EGD) WITH PROPOFOL;  Surgeon: [name], MD;  [facility];  Service: Gastroenterology;  Laterality: N/A;  . EUS N/A 09/01/2016   Procedure: ESOPHAGEAL ENDOSCOPIC ULTRASOUND (EUS) RADIAL;  Surgeon: [name], MD;  Location: [facility];  Service: Endoscopy;  Laterality: N/A;  . FINE NEEDLE ASPIRATION N/A  09/01/2016   Procedure: FINE NEEDLE ASPIRATION (FNA) RADIAL;  Surgeon: [name], MD;  [facility];  Service: Endoscopy;  Laterality: N/A;  . FOREIGN BODY REMOVAL N/A 09/01/2016   Procedure: FOREIGN BODY REMOVAL;  Surgeon: [name], MD;  [facility]: [facility];  Service: Gastroenterology;  Laterality: N/A;  . KNEE SURGERY Right 2002   arthroscopy  . PORTA CATH INSERTION N/A 09/20/2017   Procedure: PORTA CATH INSERTION;  Surgeon: [name], MD;  Location: [facility];  Service: Cardiovascular;  Laterality: N/A;  . SHOULDER SURGERY Right 2002    FAMILY HISTORY: Family History  Problem Relation Age of Onset  . Heart disease Father     ADVANCED DIRECTIVES (Y/N):  N  HEALTH MAINTENANCE: Social History   Tobacco Use  . Smoking status: Current Every Day Smoker    Packs/day: 1.00    Years: 40.00    Pack years: 40.00    Types: Cigarettes  . Smokeless tobacco: Never Used  Substance Use Topics  . Alcohol use: Yes    Alcohol/week: 7.0 standard drinks    Types: 7 Cans of beer per week    Comment: last drink 5 weeeks ago  april 2018  . Drug use: No     Colonoscopy:  PAP:  Bone density:  Lipid panel:  Allergies  Allergen Reactions  . No Known Allergies     Current Outpatient Medications  Medication Sig Dispense Refill  . acetaminophen (TYLENOL) 500 MG tablet Take 1,000 mg by mouth every 8 (eight) hours as needed.    Marland Kitchen albuterol (PROVENTIL HFA;VENTOLIN HFA) 108 (90 Base) MCG/ACT inhaler Inhale 1-2 puffs into the lungs every 6 (six) hours as needed for wheezing or shortness of breath. 1 Inhaler 6  . Ipratropium-Albuterol (COMBIVENT RESPIMAT) 20-100 MCG/ACT AERS respimat Inhale 1 puff into the lungs 4 (four) times daily. 1 Inhaler 6  . megestrol (MEGACE) 40 MG tablet Take 1 tablet (40 mg total) by mouth daily. 30 tablet 2  . Oxycodone HCl 10 MG TABS Take 1 tablet (10 mg total) by mouth 2 (two) times daily as needed. 60 tablet 0  . prochlorperazine  (COMPAZINE) 10 MG tablet Take 1 tablet (10 mg total) by mouth every 6 (six) hours as needed for nausea or vomiting. 60 tablet 0  . traMADol (ULTRAM) 50 MG tablet Take 1 tablet (50 mg total) by mouth every 6 (six) hours as needed. 30 tablet 0  . zolpidem (AMBIEN) 10 MG tablet Take 1 tablet (10 mg total) by mouth at bedtime as needed for up to 30 days for sleep. 30 tablet 1   No current facility-administered medications for this visit.    Facility-Administered Medications Ordered in Other Visits  Medication Dose Route Frequency Provider Last Rate Last Dose  . sodium chloride flush (NS) 0.9 % injection 10 mL  10 mL Intravenous PRN Lloyd Huger, MD   10 mL at 06/26/18 0927   OBJECTIVE: Vitals:   06/26/18 1006  BP: 135/77  Pulse: 74  Resp: 18  Temp: (!) 97.3 F (36.3 C)     Body mass index is 14.96 kg/m.    ECOG FS:1 - Symptomatic but completely ambulatory  General: Thin, no acute distress. Eyes: Pink conjunctiva, anicteric sclera. HEENT: Normocephalic, moist mucous membranes, clear oropharnyx. Lungs: Clear to auscultation bilaterally. Heart: Regular rate and rhythm. No rubs, murmurs, or gallops. Abdomen:  Soft, nontender, nondistended. No organomegaly noted, normoactive bowel sounds. Musculoskeletal: No edema, cyanosis, or clubbing. Neuro: Alert, answering all questions appropriately. Cranial nerves grossly intact. Skin: No rashes or petechiae noted. Psych: Normal affect.  LAB RESULTS:  Lab Results  Component Value Date   NA 133 (L) 06/26/2018   K 3.5 06/26/2018   CL 103 06/26/2018   CO2 22 06/26/2018   GLUCOSE 100 (H) 06/26/2018   BUN 10 06/26/2018   CREATININE 0.45 (L) 06/26/2018   CALCIUM 8.7 (L) 06/26/2018   PROT 6.8 06/26/2018   ALBUMIN 3.5 06/26/2018   AST 126 (H) 06/26/2018   ALT 187 (H) 06/26/2018   ALKPHOS 777 (H) 06/26/2018   BILITOT 0.8 06/26/2018   GFRNONAA >60 06/26/2018   GFRAA >60 06/26/2018    Lab Results  Component Value Date   WBC 5.3  06/26/2018   NEUTROABS 2.7 06/26/2018   HGB 12.0 (L) 06/26/2018   HCT 33.8 (L) 06/26/2018   MCV 99.7 06/26/2018   PLT 167 06/26/2018      STUDIES: No results found.  ASSESSMENT: Stage IV pancreatic cancer  PLAN:   1. Stage IV pancreatic cancer: Imaging, pathology, and Op note reviewed independently confirming stage IV disease with multiple peritoneal implants.  PET scan results from May 05, 2018 reviewed independently with clear progression of disease.  Patient's CA-19 9 also increased to 1395, but is now trending down and is 628, today's result is pending.  Patient wishes to continue treatment, but expressed understanding that his treatment options are limited.  Patient previously stated he has no interest in hospice and if chemotherapy were not an option he would like to pursue surgery or ablation.  We did discuss that this is likely not possible.  Patient has now discontinued capecitabine.  Proceed with cycle 2, day 8 of single agent gemcitabine.  Return to clinic in 1 week for further evaluation and consideration of cycle 2, day 15. 2.  Elevated liver enzymes: AST and ALT have trended up slightly.  Gemcitabine does not need to be dose reduced and liver dysfunction.  Continue to monitor closely.  Patient has not been evaluated by GI secondary to transportation issues.  Proceed with treatment as above. 3. Hyponatremia: Chronic and relatively unchanged.  Sodium is 133 today. 4. Peripheral edema: Continue elevation nightly.  Lower extremity ultrasound did not reveal DVT.   5.  Elevated bilirubin: Resolved. 6.  Pain: Chronic and unchanged.  Continue oxycodone at night tramadol during the day as needed.   7.  Poor appetite: Patient has Megace prescribed, but is unclear his compliance.   8.  Shortness of breath: Chronic and unchanged.  Patient does not complain of this today.  Continue inhalers as prescribed. 9.  Shoulder pain/tremor: Patient was previously given a referral to orthopedics  as he is received injections in the past that improved his symptoms. 10.  Insomnia: Improved with Ambien. 11.  Rash: Resolved.  Secondary gemcitabine.  Improved with Medrol Dosepak.  Continue premedications as ordered for chemotherapy.  Patient may require low-dose daily steroids in the future.   12.  Thrombocytopenia: Resolved.  Proceed with gemcitabine as above.  Patient has now discontinued capecitabine.  Patient expressed understanding and was in agreement with this plan. He also understands that He can call clinic at any time with any questions, concerns, or complaints.   Cancer Staging Pancreatic cancer [facility]) Staging form: Exocrine Pancreas, AJCC 8th Edition - Clinical stage from 09/25/2016: Stage IV (cTX, cNX, pM1) - Signed by Delight Hoh  J, MD on 09/25/2016   Lloyd Huger, MD   06/26/2018 12:15 PM

## 2018-06-26 ENCOUNTER — Inpatient Hospital Stay (HOSPITAL_BASED_OUTPATIENT_CLINIC_OR_DEPARTMENT_OTHER): Payer: Medicaid Other | Admitting: Oncology

## 2018-06-26 ENCOUNTER — Inpatient Hospital Stay: Payer: Medicaid Other

## 2018-06-26 ENCOUNTER — Encounter: Payer: Self-pay | Admitting: Oncology

## 2018-06-26 ENCOUNTER — Other Ambulatory Visit: Payer: Self-pay

## 2018-06-26 VITALS — BP 135/77 | HR 74 | Temp 97.3°F | Resp 18 | Wt 110.3 lb

## 2018-06-26 DIAGNOSIS — R748 Abnormal levels of other serum enzymes: Secondary | ICD-10-CM

## 2018-06-26 DIAGNOSIS — R600 Localized edema: Secondary | ICD-10-CM

## 2018-06-26 DIAGNOSIS — F1190 Opioid use, unspecified, uncomplicated: Secondary | ICD-10-CM

## 2018-06-26 DIAGNOSIS — C259 Malignant neoplasm of pancreas, unspecified: Secondary | ICD-10-CM | POA: Diagnosis not present

## 2018-06-26 DIAGNOSIS — Z79899 Other long term (current) drug therapy: Secondary | ICD-10-CM | POA: Diagnosis not present

## 2018-06-26 DIAGNOSIS — Z5111 Encounter for antineoplastic chemotherapy: Secondary | ICD-10-CM | POA: Diagnosis not present

## 2018-06-26 DIAGNOSIS — R251 Tremor, unspecified: Secondary | ICD-10-CM

## 2018-06-26 DIAGNOSIS — M25519 Pain in unspecified shoulder: Secondary | ICD-10-CM

## 2018-06-26 DIAGNOSIS — G4700 Insomnia, unspecified: Secondary | ICD-10-CM | POA: Diagnosis not present

## 2018-06-26 DIAGNOSIS — G8929 Other chronic pain: Secondary | ICD-10-CM

## 2018-06-26 DIAGNOSIS — C786 Secondary malignant neoplasm of retroperitoneum and peritoneum: Secondary | ICD-10-CM | POA: Diagnosis not present

## 2018-06-26 DIAGNOSIS — F5089 Other specified eating disorder: Secondary | ICD-10-CM

## 2018-06-26 DIAGNOSIS — E871 Hypo-osmolality and hyponatremia: Secondary | ICD-10-CM

## 2018-06-26 LAB — CBC WITH DIFFERENTIAL/PLATELET
Abs Immature Granulocytes: 0.02 10*3/uL (ref 0.00–0.07)
BASOS PCT: 1 %
Basophils Absolute: 0 10*3/uL (ref 0.0–0.1)
Eosinophils Absolute: 0.3 10*3/uL (ref 0.0–0.5)
Eosinophils Relative: 5 %
HCT: 33.8 % — ABNORMAL LOW (ref 39.0–52.0)
Hemoglobin: 12 g/dL — ABNORMAL LOW (ref 13.0–17.0)
Immature Granulocytes: 0 %
Lymphocytes Relative: 26 %
Lymphs Abs: 1.4 10*3/uL (ref 0.7–4.0)
MCH: 35.4 pg — ABNORMAL HIGH (ref 26.0–34.0)
MCHC: 35.5 g/dL (ref 30.0–36.0)
MCV: 99.7 fL (ref 80.0–100.0)
Monocytes Absolute: 0.9 10*3/uL (ref 0.1–1.0)
Monocytes Relative: 16 %
Neutro Abs: 2.7 10*3/uL (ref 1.7–7.7)
Neutrophils Relative %: 52 %
Platelets: 167 10*3/uL (ref 150–400)
RBC: 3.39 MIL/uL — ABNORMAL LOW (ref 4.22–5.81)
RDW: 16.6 % — ABNORMAL HIGH (ref 11.5–15.5)
WBC: 5.3 10*3/uL (ref 4.0–10.5)
nRBC: 0 % (ref 0.0–0.2)

## 2018-06-26 LAB — COMPREHENSIVE METABOLIC PANEL
ALK PHOS: 777 U/L — AB (ref 38–126)
ALT: 187 U/L — AB (ref 0–44)
AST: 126 U/L — ABNORMAL HIGH (ref 15–41)
Albumin: 3.5 g/dL (ref 3.5–5.0)
Anion gap: 8 (ref 5–15)
BUN: 10 mg/dL (ref 6–20)
CO2: 22 mmol/L (ref 22–32)
Calcium: 8.7 mg/dL — ABNORMAL LOW (ref 8.9–10.3)
Chloride: 103 mmol/L (ref 98–111)
Creatinine, Ser: 0.45 mg/dL — ABNORMAL LOW (ref 0.61–1.24)
GFR calc Af Amer: >60 mL/min (ref >60)
GFR calc non Af Amer: >60 mL/min (ref >60)
Glucose, Bld: 100 mg/dL — ABNORMAL HIGH (ref 70–99)
Potassium: 3.5 mmol/L (ref 3.5–5.1)
Sodium: 133 mmol/L — ABNORMAL LOW (ref 135–145)
Total Bilirubin: 0.8 mg/dL (ref 0.3–1.2)
Total Protein: 6.8 g/dL (ref 6.5–8.1)

## 2018-06-26 LAB — MAGNESIUM: MAGNESIUM: 2 mg/dL (ref 1.7–2.4)

## 2018-06-26 MED ORDER — SODIUM CHLORIDE 0.9 % IV SOLN
Freq: Once | INTRAVENOUS | Status: AC
  Administered 2018-06-26: 11:00:00 via INTRAVENOUS
  Filled 2018-06-26: qty 250

## 2018-06-26 MED ORDER — SODIUM CHLORIDE 0.9% FLUSH
10.0000 mL | INTRAVENOUS | Status: DC | PRN
  Administered 2018-06-26: 10 mL via INTRAVENOUS
  Filled 2018-06-26: qty 10

## 2018-06-26 MED ORDER — SODIUM CHLORIDE 0.9 % IV SOLN
1600.0000 mg | Freq: Once | INTRAVENOUS | Status: AC
  Administered 2018-06-26: 1600 mg via INTRAVENOUS
  Filled 2018-06-26: qty 42.08

## 2018-06-26 MED ORDER — SODIUM CHLORIDE 0.9 % IV SOLN
Freq: Once | INTRAVENOUS | Status: AC
  Administered 2018-06-26: 11:00:00 via INTRAVENOUS
  Filled 2018-06-26: qty 100

## 2018-06-26 MED ORDER — PROCHLORPERAZINE MALEATE 10 MG PO TABS
10.0000 mg | ORAL_TABLET | Freq: Once | ORAL | Status: AC
  Administered 2018-06-26: 10 mg via ORAL
  Filled 2018-06-26: qty 1

## 2018-06-26 MED ORDER — HEPARIN SOD (PORK) LOCK FLUSH 100 UNIT/ML IV SOLN
500.0000 [IU] | Freq: Once | INTRAVENOUS | Status: AC
  Administered 2018-06-26: 500 [IU] via INTRAVENOUS
  Filled 2018-06-26: qty 5

## 2018-06-26 MED ORDER — FAMOTIDINE IN NACL 20-0.9 MG/50ML-% IV SOLN: 20.0000 mg | Freq: Once | INTRAVENOUS | Status: DC

## 2018-06-26 MED ORDER — DIPHENHYDRAMINE HCL 50 MG/ML IJ SOLN
25.0000 mg | Freq: Once | INTRAMUSCULAR | Status: AC
  Administered 2018-06-26: 25 mg via INTRAVENOUS
  Filled 2018-06-26: qty 1

## 2018-06-26 MED ORDER — METHYLPREDNISOLONE SODIUM SUCC 125 MG IJ SOLR
40.0000 mg | Freq: Once | INTRAMUSCULAR | Status: AC
  Administered 2018-06-26: 40 mg via INTRAVENOUS
  Filled 2018-06-26: qty 2

## 2018-06-26 NOTE — Progress Notes (Signed)
Patient denies any concerns today.  

## 2018-07-02 NOTE — Progress Notes (Signed)
[place]  Telephone:([phone] [phone]  ID: Benjamin Diaz. OB: 08/18/1960  MR#: [id number]  DTO#:[id number]  Patient Care Team: Patient, [name] as PCP - General (General Practice) [name], MD as Consulting Physician (Oncology) [name], MD (Inactive) as Consulting Physician (Gastroenterology) [name], MD as Consulting Physician (Gastroenterology)  CHIEF COMPLAINT: Stage IV pancreatic cancer  INTERVAL HISTORY: Patient returns to clinic today for further evaluation and consideration of cycle 2, day 15 of single agent gemcitabine.  He continues to tolerate his treatments well without significant side effects.  He denies any further rash.  He no longer has insomnia. He denies any pain is currently not taking any further narcotics. He has chronic weakness and fatigue that is unchanged. He has a poor appetite, but no weight loss.  He has no neurologic complaints.  He denies any recent fevers or illnesses.  He denies any chest pain, shortness of breath, cough, or hemoptysis.  He denies any nausea, vomiting, constipation, or diarrhea. He has no urinary complaints.  Patient offers no specific complaints today.  REVIEW OF SYSTEMS:   Review of Systems  Constitutional: Positive for malaise/fatigue. Negative for fever and weight loss.  Respiratory: Negative.  Negative for cough and shortness of breath.   Cardiovascular: Negative.  Negative for chest pain and leg swelling.  Gastrointestinal: Negative.  Negative for abdominal pain, diarrhea, nausea and vomiting.  Genitourinary: Negative.  Negative for dysuria, flank pain, frequency and urgency.  Musculoskeletal: Negative.  Negative for back pain and joint pain.  Skin: Negative.  Negative for itching and rash.  Neurological: Positive for weakness. Negative for tingling, tremors, sensory change and focal weakness.  Psychiatric/Behavioral: Negative.  The patient is not nervous/anxious and does  not have insomnia.     As per HPI. Otherwise, a complete review of systems is negative.  PAST MEDICAL HISTORY: Past Medical History:  Diagnosis Date  . Arthritis   . Asthma   . Atrial fibrillation ([place])   . Closed left ankle fracture age 58  . COPD (chronic obstructive pulmonary disease) ([place])   . Dysrhythmia   . Pancreatic cancer ([place]) 06/2016   Chemo tx's.   . Pneumonia 2011  . Right shoulder injury 09/27/2016  . Sciatica    left leg pinched nerve numb at times about once a year    PAST SURGICAL HISTORY: Past Surgical History:  Procedure Laterality Date  . BILIARY STENT PLACEMENT N/A 09/01/2016   Procedure: BILIARY STENT PLACEMENT;  Surgeon: [name], MD;  [facility];  Service: Endoscopy;  Laterality: N/A;  . DIAGNOSTIC LAPAROSCOPIC LIVER BIOPSY  09/16/2016   Procedure: DIAGNOSTIC LAPAROSCOPIC LIVER BIOPSY;  Surgeon: [name], MD;  Location: [facility];  Service: General;;  . ERCP N/A 07/09/2016   Procedure: ENDOSCOPIC RETROGRADE CHOLANGIOPANCREATOGRAPHY (ERCP);  Surgeon: [name], MD;  Location: [facility];  Service: Endoscopy;  Laterality: N/A;  . ERCP N/A 09/01/2016   Procedure: ENDOSCOPIC RETROGRADE CHOLANGIOPANCREATOGRAPHY (ERCP);  Surgeon: [name], MD;  Location: [facility];  Service: Endoscopy;  Laterality: N/A;  . ERCP N/A 06/28/2017   Procedure: ENDOSCOPIC RETROGRADE CHOLANGIOPANCREATOGRAPHY (ERCP);  Surgeon: [name], MD;  Location: [facility] ENDOSCOPY;  Service: Endoscopy;  Laterality: N/A;  . ESOPHAGOGASTRODUODENOSCOPY (EGD) WITH PROPOFOL N/A 09/01/2016   Procedure: ESOPHAGOGASTRODUODENOSCOPY (EGD) WITH PROPOFOL;  Surgeon: [name], MD;  Location: [facility];  Service: Gastroenterology;  Laterality: N/A;  . EUS N/A 09/01/2016   Procedure: ESOPHAGEAL ENDOSCOPIC ULTRASOUND (EUS) RADIAL;  Surgeon: [name], MD;  [facility];  Service: Endoscopy;  Laterality: N/A;  . FINE NEEDLE ASPIRATION N/A 09/01/2016   Procedure: FINE  NEEDLE ASPIRATION (FNA) RADIAL;  Surgeon: [name], MD;  [facility];  Service: Endoscopy;  Laterality: N/A;  . FOREIGN BODY REMOVAL N/A 09/01/2016   Procedure: FOREIGN BODY REMOVAL;  Surgeon: [name], MD;  [facility];  Service: Gastroenterology;  Laterality: N/A;  . KNEE SURGERY Right 2002   arthroscopy  . PORTA CATH INSERTION N/A 09/20/2017   Procedure: PORTA CATH INSERTION;  Surgeon: [name], MD;  Location: [facility];  Service: Cardiovascular;  Laterality: N/A;  . SHOULDER SURGERY Right 2002    FAMILY HISTORY: Family History  Problem Relation Age of Onset  . Heart disease Father     ADVANCED DIRECTIVES (Y/N):  N  HEALTH MAINTENANCE: Social History   Tobacco Use  . Smoking status: Current Every Day Smoker    Packs/day: 1.00    Years: 40.00    Pack years: 40.00    Types: Cigarettes  . Smokeless tobacco: Never Used  Substance Use Topics  . Alcohol use: Yes    Alcohol/week: 7.0 standard drinks    Types: 7 Cans of beer per week    Comment: last drink 5 weeeks ago  april 2018  . Drug use: No     Colonoscopy:  PAP:  Bone density:  Lipid panel:  Allergies  Allergen Reactions  . No Known Allergies     Current Outpatient Medications  Medication Sig Dispense Refill  . acetaminophen (TYLENOL) 500 MG tablet Take 1,000 mg by mouth every 8 (eight) hours as needed.    Marland Kitchen albuterol (PROVENTIL HFA;VENTOLIN HFA) 108 (90 Base) MCG/ACT inhaler Inhale 1-2 puffs into the lungs every 6 (six) hours as needed for wheezing or shortness of breath. 1 Inhaler 6  . Ipratropium-Albuterol (COMBIVENT RESPIMAT) 20-100 MCG/ACT AERS respimat Inhale 1 puff into the lungs 4 (four) times daily. 1 Inhaler 6  . megestrol (MEGACE) 40 MG tablet Take 1 tablet (40 mg total) by mouth daily. 30 tablet 2  . Oxycodone HCl 10 MG TABS Take 1 tablet (10 mg total) by mouth 2 (two) times daily as needed. 60 tablet 0  . prochlorperazine (COMPAZINE) 10 MG tablet  Take 1 tablet (10 mg total) by mouth every 6 (six) hours as needed for nausea or vomiting. 60 tablet 0  . traMADol (ULTRAM) 50 MG tablet Take 1 tablet (50 mg total) by mouth every 6 (six) hours as needed. 30 tablet 0  . zolpidem (AMBIEN) 10 MG tablet Take 1 tablet (10 mg total) by mouth at bedtime as needed for up to 30 days for sleep. 30 tablet 1   No current facility-administered medications for this visit.    OBJECTIVE: Vitals:   07/03/18 0858 07/03/18 0859  BP:  128/81  Pulse:  81  Temp: 97.8 F (36.6 C) 97.8 F (36.6 C)     Body mass index is 15.18 kg/m.    ECOG FS:1 - Symptomatic but completely ambulatory  General: Thin, no acute distress. Eyes: Pink conjunctiva, anicteric sclera. HEENT: Normocephalic, moist mucous membranes. Lungs: Clear to auscultation bilaterally. Heart: Regular rate and rhythm. No rubs, murmurs, or gallops. Abdomen: Soft, nontender, nondistended. No organomegaly noted, normoactive bowel sounds. Musculoskeletal: No edema, cyanosis, or clubbing. Neuro: Alert, answering all questions appropriately. Cranial nerves grossly intact. Skin: No rashes or petechiae noted. Psych: Normal affect.  LAB RESULTS:  Lab Results  Component Value Date   NA 131 (  L) 07/03/2018   K 3.5 07/03/2018   CL 102 07/03/2018   CO2 22 07/03/2018   GLUCOSE 119 (H) 07/03/2018   BUN 9 07/03/2018   CREATININE <0.30 (L) 07/03/2018   CALCIUM 8.8 (L) 07/03/2018   PROT 7.0 07/03/2018   ALBUMIN 3.8 07/03/2018   AST 135 (H) 07/03/2018   ALT 208 (H) 07/03/2018   ALKPHOS 850 (H) 07/03/2018   BILITOT 0.7 07/03/2018   GFRNONAA NOT CALCULATED 07/03/2018   GFRAA NOT CALCULATED 07/03/2018    Lab Results  Component Value Date   WBC 5.0 07/03/2018   NEUTROABS 2.5 07/03/2018   HGB 12.6 (L) 07/03/2018   HCT 34.9 (L) 07/03/2018   MCV 100.9 (H) 07/03/2018   PLT 118 (L) 07/03/2018      STUDIES: No results found.  ASSESSMENT: Stage IV pancreatic cancer  PLAN:   1. Stage IV  pancreatic cancer: Imaging, pathology, and Op note reviewed independently confirming stage IV disease with multiple peritoneal implants.  PET scan results from May 05, 2018 reviewed independently with clear progression of disease.  Patient's CA-19 9 also increased to 1395, but is now trending down and is 628, today's result is pending.  Patient wishes to continue treatment, but expressed understanding that his treatment options are limited.  Patient previously stated he has no interest in hospice and if chemotherapy were not an option he would like to pursue surgery or ablation.  We did discuss that this is likely not possible.  Patient has now discontinued capecitabine.  Proceed with cycle 2, day 15 of single agent gemcitabine.  Return to clinic in 2 weeks for further evaluation and consideration of cycle 3, day 1.  Will consider reimaging at the conclusion of cycle 3.  2.  Elevated liver enzymes: AST and ALT continues to slowly trend up.  Gemcitabine does not need to be dose reduced and liver dysfunction.  Continue to monitor closely.  Patient has not been evaluated by GI secondary to transportation issues.  Proceed with treatment as above. 3. Hyponatremia: Chronic and relatively unchanged.  Sodium is 131 today. 4. Peripheral edema: Continue elevation nightly.  Lower extremity ultrasound did not reveal DVT.   5.  Elevated bilirubin: Resolved. 6.  Pain: Chronic and unchanged.  Continue oxycodone at night tramadol during the day as needed.   7.  Poor appetite: Patient has Megace prescribed, but is unclear his compliance.   8.  Shortness of breath: Chronic and unchanged.  Patient does not complain of this today.  Continue inhalers as prescribed. 9.  Shoulder pain/tremor: Patient was previously given a referral to orthopedics as he is received injections in the past that improved his symptoms. 10.  Insomnia: Improved with Ambien. 11.  Rash: Resolved.  Secondary gemcitabine.  Improved with Medrol Dosepak.   Continue premedications as ordered for chemotherapy.  Patient may require low-dose daily steroids in the future.   12.  Thrombocytopenia: Mild, monitor.  Proceed with treatment as above.  Patient expressed understanding and was in agreement with this plan. He also understands that He can call clinic at any time with any questions, concerns, or complaints.   Cancer Staging Pancreatic cancer [facility]) Staging form: Exocrine Pancreas, AJCC 8th Edition - Clinical stage from 09/25/2016: Stage IV (cTX, cNX, pM1) - Signed by Lloyd Huger, MD on 09/25/2016   Lloyd Huger, MD   07/03/2018 4:42 PM

## 2018-07-03 ENCOUNTER — Inpatient Hospital Stay: Payer: Medicaid Other

## 2018-07-03 ENCOUNTER — Inpatient Hospital Stay: Payer: Medicaid Other | Attending: Oncology

## 2018-07-03 ENCOUNTER — Encounter: Payer: Self-pay | Admitting: Oncology

## 2018-07-03 ENCOUNTER — Inpatient Hospital Stay (HOSPITAL_BASED_OUTPATIENT_CLINIC_OR_DEPARTMENT_OTHER): Payer: Medicaid Other | Admitting: Oncology

## 2018-07-03 ENCOUNTER — Other Ambulatory Visit: Payer: Self-pay

## 2018-07-03 VITALS — Resp 20

## 2018-07-03 VITALS — BP 128/81 | HR 81 | Temp 97.8°F | Ht 72.0 in | Wt 111.9 lb

## 2018-07-03 DIAGNOSIS — C259 Malignant neoplasm of pancreas, unspecified: Secondary | ICD-10-CM

## 2018-07-03 DIAGNOSIS — Z5111 Encounter for antineoplastic chemotherapy: Secondary | ICD-10-CM | POA: Insufficient documentation

## 2018-07-03 DIAGNOSIS — D696 Thrombocytopenia, unspecified: Secondary | ICD-10-CM | POA: Diagnosis not present

## 2018-07-03 DIAGNOSIS — R630 Anorexia: Secondary | ICD-10-CM

## 2018-07-03 DIAGNOSIS — F17210 Nicotine dependence, cigarettes, uncomplicated: Secondary | ICD-10-CM

## 2018-07-03 DIAGNOSIS — R600 Localized edema: Secondary | ICD-10-CM

## 2018-07-03 DIAGNOSIS — R5382 Chronic fatigue, unspecified: Secondary | ICD-10-CM

## 2018-07-03 DIAGNOSIS — R748 Abnormal levels of other serum enzymes: Secondary | ICD-10-CM

## 2018-07-03 DIAGNOSIS — C786 Secondary malignant neoplasm of retroperitoneum and peritoneum: Secondary | ICD-10-CM

## 2018-07-03 DIAGNOSIS — R5383 Other fatigue: Secondary | ICD-10-CM

## 2018-07-03 DIAGNOSIS — E871 Hypo-osmolality and hyponatremia: Secondary | ICD-10-CM

## 2018-07-03 LAB — COMPREHENSIVE METABOLIC PANEL
ALT: 208 U/L — ABNORMAL HIGH (ref 0–44)
AST: 135 U/L — ABNORMAL HIGH (ref 15–41)
Albumin: 3.8 g/dL (ref 3.5–5.0)
Alkaline Phosphatase: 850 U/L — ABNORMAL HIGH (ref 38–126)
Anion gap: 7 (ref 5–15)
BUN: 9 mg/dL (ref 6–20)
CO2: 22 mmol/L (ref 22–32)
Calcium: 8.8 mg/dL — ABNORMAL LOW (ref 8.9–10.3)
Chloride: 102 mmol/L (ref 98–111)
Creatinine, Ser: <0.3 mg/dL — ABNORMAL LOW (ref 0.61–1.24)
Glucose, Bld: 119 mg/dL — ABNORMAL HIGH (ref 70–99)
Potassium: 3.5 mmol/L (ref 3.5–5.1)
Sodium: 131 mmol/L — ABNORMAL LOW (ref 135–145)
Total Bilirubin: 0.7 mg/dL (ref 0.3–1.2)
Total Protein: 7 g/dL (ref 6.5–8.1)

## 2018-07-03 LAB — CBC WITH DIFFERENTIAL/PLATELET
Abs Immature Granulocytes: 0.03 10*3/uL (ref 0.00–0.07)
Basophils Absolute: 0 10*3/uL (ref 0.0–0.1)
Basophils Relative: 1 %
Eosinophils Absolute: 0.2 10*3/uL (ref 0.0–0.5)
Eosinophils Relative: 4 %
HCT: 34.9 % — ABNORMAL LOW (ref 39.0–52.0)
Hemoglobin: 12.6 g/dL — ABNORMAL LOW (ref 13.0–17.0)
Immature Granulocytes: 1 %
Lymphocytes Relative: 30 %
Lymphs Abs: 1.5 10*3/uL (ref 0.7–4.0)
MCH: 36.4 pg — ABNORMAL HIGH (ref 26.0–34.0)
MCHC: 36.1 g/dL — ABNORMAL HIGH (ref 30.0–36.0)
MCV: 100.9 fL — ABNORMAL HIGH (ref 80.0–100.0)
Monocytes Absolute: 0.7 10*3/uL (ref 0.1–1.0)
Monocytes Relative: 14 %
Neutro Abs: 2.5 10*3/uL (ref 1.7–7.7)
Neutrophils Relative %: 50 %
Platelets: 118 10*3/uL — ABNORMAL LOW (ref 150–400)
RBC: 3.46 MIL/uL — ABNORMAL LOW (ref 4.22–5.81)
RDW: 17.1 % — ABNORMAL HIGH (ref 11.5–15.5)
WBC: 5 10*3/uL (ref 4.0–10.5)
nRBC: 0 % (ref 0.0–0.2)

## 2018-07-03 LAB — MAGNESIUM: Magnesium: 2 mg/dL (ref 1.7–2.4)

## 2018-07-03 MED ORDER — SODIUM CHLORIDE 0.9 % IV SOLN
Freq: Once | INTRAVENOUS | Status: AC
  Administered 2018-07-03: 10:00:00 via INTRAVENOUS
  Filled 2018-07-03: qty 250

## 2018-07-03 MED ORDER — METHYLPREDNISOLONE SODIUM SUCC 125 MG IJ SOLR
40.0000 mg | Freq: Once | INTRAMUSCULAR | Status: AC
  Administered 2018-07-03: 40 mg via INTRAVENOUS
  Filled 2018-07-03: qty 2

## 2018-07-03 MED ORDER — DIPHENHYDRAMINE HCL 50 MG/ML IJ SOLN
25.0000 mg | Freq: Once | INTRAMUSCULAR | Status: AC
  Administered 2018-07-03: 25 mg via INTRAVENOUS
  Filled 2018-07-03: qty 1

## 2018-07-03 MED ORDER — HEPARIN SOD (PORK) LOCK FLUSH 100 UNIT/ML IV SOLN
500.0000 [IU] | Freq: Once | INTRAVENOUS | Status: AC | PRN
  Administered 2018-07-03: 500 [IU]
  Filled 2018-07-03: qty 5

## 2018-07-03 MED ORDER — FAMOTIDINE IN NACL 20-0.9 MG/50ML-% IV SOLN
20.0000 mg | Freq: Once | INTRAVENOUS | Status: AC
  Administered 2018-07-03: 20 mg via INTRAVENOUS
  Filled 2018-07-03: qty 50

## 2018-07-03 MED ORDER — SODIUM CHLORIDE 0.9% FLUSH
10.0000 mL | Freq: Once | INTRAVENOUS | Status: AC
  Administered 2018-07-03: 10 mL via INTRAVENOUS
  Filled 2018-07-03: qty 10

## 2018-07-03 MED ORDER — SODIUM CHLORIDE 0.9 % IV SOLN: Freq: Once | INTRAVENOUS | Status: DC

## 2018-07-03 MED ORDER — PROCHLORPERAZINE MALEATE 10 MG PO TABS
10.0000 mg | ORAL_TABLET | Freq: Once | ORAL | Status: AC
  Administered 2018-07-03: 10 mg via ORAL
  Filled 2018-07-03: qty 1

## 2018-07-03 MED ORDER — SODIUM CHLORIDE 0.9 % IV SOLN
1600.0000 mg | Freq: Once | INTRAVENOUS | Status: AC
  Administered 2018-07-03: 1600 mg via INTRAVENOUS
  Filled 2018-07-03: qty 26.3

## 2018-07-03 NOTE — Progress Notes (Signed)
MD made aware of pt.'s elevated liver enzymes, which are outside of parameters. Verbal order to continue with treatment. Pt updated with no questions at this time.   Benjamin Diaz [employer]

## 2018-07-03 NOTE — Progress Notes (Signed)
Patient stated that he had been doing well with no complaints. 

## 2018-07-03 NOTE — Progress Notes (Signed)
Per MD ok to treat with liver enzymes out of parameters

## 2018-07-17 ENCOUNTER — Inpatient Hospital Stay (HOSPITAL_BASED_OUTPATIENT_CLINIC_OR_DEPARTMENT_OTHER): Payer: Medicaid Other | Admitting: Oncology

## 2018-07-17 ENCOUNTER — Encounter: Payer: Self-pay | Admitting: Oncology

## 2018-07-17 ENCOUNTER — Inpatient Hospital Stay: Payer: Medicaid Other

## 2018-07-17 ENCOUNTER — Other Ambulatory Visit: Payer: Self-pay

## 2018-07-17 ENCOUNTER — Other Ambulatory Visit: Payer: Self-pay | Admitting: *Deleted

## 2018-07-17 VITALS — BP 130/84 | HR 67 | Temp 97.4°F | Resp 18 | Wt 115.9 lb

## 2018-07-17 DIAGNOSIS — C259 Malignant neoplasm of pancreas, unspecified: Secondary | ICD-10-CM

## 2018-07-17 DIAGNOSIS — Z5111 Encounter for antineoplastic chemotherapy: Secondary | ICD-10-CM | POA: Diagnosis not present

## 2018-07-17 DIAGNOSIS — R0602 Shortness of breath: Secondary | ICD-10-CM

## 2018-07-17 DIAGNOSIS — R600 Localized edema: Secondary | ICD-10-CM

## 2018-07-17 DIAGNOSIS — E871 Hypo-osmolality and hyponatremia: Secondary | ICD-10-CM | POA: Diagnosis not present

## 2018-07-17 DIAGNOSIS — R5382 Chronic fatigue, unspecified: Secondary | ICD-10-CM

## 2018-07-17 DIAGNOSIS — C786 Secondary malignant neoplasm of retroperitoneum and peritoneum: Secondary | ICD-10-CM | POA: Diagnosis not present

## 2018-07-17 DIAGNOSIS — F17210 Nicotine dependence, cigarettes, uncomplicated: Secondary | ICD-10-CM

## 2018-07-17 DIAGNOSIS — R5383 Other fatigue: Secondary | ICD-10-CM

## 2018-07-17 DIAGNOSIS — R630 Anorexia: Secondary | ICD-10-CM

## 2018-07-17 LAB — COMPREHENSIVE METABOLIC PANEL
ALT: 153 U/L — ABNORMAL HIGH (ref 0–44)
AST: 116 U/L — ABNORMAL HIGH (ref 15–41)
Albumin: 3.5 g/dL (ref 3.5–5.0)
Alkaline Phosphatase: 813 U/L — ABNORMAL HIGH (ref 38–126)
Anion gap: 8 (ref 5–15)
BUN: 8 mg/dL (ref 6–20)
CO2: 23 mmol/L (ref 22–32)
Calcium: 8.9 mg/dL (ref 8.9–10.3)
Chloride: 102 mmol/L (ref 98–111)
Creatinine, Ser: 0.32 mg/dL — ABNORMAL LOW (ref 0.61–1.24)
GFR calc Af Amer: >60 mL/min (ref >60)
GFR calc non Af Amer: >60 mL/min (ref >60)
Glucose, Bld: 109 mg/dL — ABNORMAL HIGH (ref 70–99)
Potassium: 3.7 mmol/L (ref 3.5–5.1)
Sodium: 133 mmol/L — ABNORMAL LOW (ref 135–145)
Total Bilirubin: 0.8 mg/dL (ref 0.3–1.2)
Total Protein: 7.1 g/dL (ref 6.5–8.1)

## 2018-07-17 LAB — CBC WITH DIFFERENTIAL/PLATELET
Abs Immature Granulocytes: 0.06 10*3/uL (ref 0.00–0.07)
Basophils Absolute: 0.1 10*3/uL (ref 0.0–0.1)
Basophils Relative: 1 %
Eosinophils Absolute: 0.5 10*3/uL (ref 0.0–0.5)
Eosinophils Relative: 5 %
HCT: 36.4 % — ABNORMAL LOW (ref 39.0–52.0)
Hemoglobin: 12.5 g/dL — ABNORMAL LOW (ref 13.0–17.0)
Immature Granulocytes: 1 %
Lymphocytes Relative: 15 %
Lymphs Abs: 1.5 10*3/uL (ref 0.7–4.0)
MCH: 35.1 pg — ABNORMAL HIGH (ref 26.0–34.0)
MCHC: 34.3 g/dL (ref 30.0–36.0)
MCV: 102.2 fL — ABNORMAL HIGH (ref 80.0–100.0)
Monocytes Absolute: 1.4 10*3/uL — ABNORMAL HIGH (ref 0.1–1.0)
Monocytes Relative: 14 %
Neutro Abs: 6.4 10*3/uL (ref 1.7–7.7)
Neutrophils Relative %: 64 %
Platelets: 325 10*3/uL (ref 150–400)
RBC: 3.56 MIL/uL — ABNORMAL LOW (ref 4.22–5.81)
RDW: 16.5 % — ABNORMAL HIGH (ref 11.5–15.5)
WBC: 9.8 10*3/uL (ref 4.0–10.5)
nRBC: 0 % (ref 0.0–0.2)

## 2018-07-17 MED ORDER — OXYCODONE HCL 10 MG PO TABS: 10.0000 mg | ORAL_TABLET | Freq: Two times a day (BID) | ORAL | 0 refills | Status: DC | PRN

## 2018-07-17 MED ORDER — FAMOTIDINE IN NACL 20-0.9 MG/50ML-% IV SOLN
20.0000 mg | Freq: Once | INTRAVENOUS | Status: AC
  Administered 2018-07-17: 11:00:00 20 mg via INTRAVENOUS
  Filled 2018-07-17: qty 50

## 2018-07-17 MED ORDER — HEPARIN SOD (PORK) LOCK FLUSH 100 UNIT/ML IV SOLN
500.0000 [IU] | Freq: Once | INTRAVENOUS | Status: AC
  Administered 2018-07-17: 12:00:00 500 [IU] via INTRAVENOUS
  Filled 2018-07-17: qty 5

## 2018-07-17 MED ORDER — SODIUM CHLORIDE 0.9 % IV SOLN
1600.0000 mg | Freq: Once | INTRAVENOUS | Status: AC
  Administered 2018-07-17: 11:00:00 1600 mg via INTRAVENOUS
  Filled 2018-07-17: qty 26.3

## 2018-07-17 MED ORDER — SODIUM CHLORIDE 0.9% FLUSH
10.0000 mL | Freq: Once | INTRAVENOUS | Status: AC
  Administered 2018-07-17: 10 mL via INTRAVENOUS
  Filled 2018-07-17: qty 10

## 2018-07-17 MED ORDER — PROCHLORPERAZINE MALEATE 10 MG PO TABS
10.0000 mg | ORAL_TABLET | Freq: Once | ORAL | Status: AC
  Administered 2018-07-17: 10 mg via ORAL
  Filled 2018-07-17: qty 1

## 2018-07-17 MED ORDER — DIPHENHYDRAMINE HCL 50 MG/ML IJ SOLN
25.0000 mg | Freq: Once | INTRAMUSCULAR | Status: AC
  Administered 2018-07-17: 11:00:00 25 mg via INTRAVENOUS
  Filled 2018-07-17: qty 1

## 2018-07-17 MED ORDER — SODIUM CHLORIDE 0.9 % IV SOLN
Freq: Once | INTRAVENOUS | Status: AC
  Administered 2018-07-17: 10:00:00 via INTRAVENOUS
  Filled 2018-07-17: qty 250

## 2018-07-17 MED ORDER — METHYLPREDNISOLONE SODIUM SUCC 125 MG IJ SOLR
40.0000 mg | Freq: Once | INTRAMUSCULAR | Status: AC
  Administered 2018-07-17: 11:00:00 40 mg via INTRAVENOUS
  Filled 2018-07-17: qty 2

## 2018-07-17 NOTE — Progress Notes (Signed)
Patient denies any concerns today.  

## 2018-07-17 NOTE — Progress Notes (Signed)
ALT: 153. MD, Dr. Grayland Ormond, notified and already aware. Per MD order: proceed with scheduled Gemzar treatment today.

## 2018-07-17 NOTE — Progress Notes (Signed)
[name]  Telephone:([phone] [phone]  ID: Benjamin Diaz. OB: [birth year]  MR#: [id number]  ASN#:[id number]  Patient Care Team: Patient, [name] as PCP - General (General Practice) [name], MD as Consulting Physician (Oncology) [name], MD (Inactive) as Consulting Physician (Gastroenterology) [name], MD as Consulting Physician (Gastroenterology)  CHIEF COMPLAINT: Stage IV pancreatic cancer  INTERVAL HISTORY: Patient returns to clinic today for further evaluation and consideration of cycle 3, day 1 of single agent gemcitabine. He currently feels well and is asymptomatic. He is tolerating his treatments well without significant side effects.  He denies any further rash.  He no longer has insomnia. He denies any pain, but will occasionally take oxycodone for back pain. He has chronic weakness and fatigue that is unchanged. He has a poor appetite, but no weight loss.  He has no neurologic complaints.  He denies any recent fevers or illnesses.  He denies any chest pain, shortness of breath, cough, or hemoptysis.  He denies any nausea, vomiting, constipation, or diarrhea. He has no urinary complaints. Patient feels at his baseline and offers no further specific complaints today.  REVIEW OF SYSTEMS:   Review of Systems  Constitutional: Positive for malaise/fatigue. Negative for fever and weight loss.  Respiratory: Negative.  Negative for cough and shortness of breath.   Cardiovascular: Negative.  Negative for chest pain and leg swelling.  Gastrointestinal: Negative.  Negative for abdominal pain, diarrhea, nausea and vomiting.  Genitourinary: Negative.  Negative for dysuria, flank pain, frequency and urgency.  Musculoskeletal: Negative.  Negative for back pain and joint pain.  Skin: Negative.  Negative for itching and rash.  Neurological: Positive for weakness. Negative for tingling, tremors, sensory change and focal weakness.   Psychiatric/Behavioral: Negative.  The patient is not nervous/anxious and does not have insomnia.     As per HPI. Otherwise, a complete review of systems is negative.  PAST MEDICAL HISTORY: Past Medical History:  Diagnosis Date  . Arthritis   . Asthma   . Atrial fibrillation ([place])   . Closed left ankle fracture age 87  . COPD (chronic obstructive pulmonary disease) ([place])   . Dysrhythmia   . Pancreatic cancer ([place]) 06/2016   Chemo tx's.   . Pneumonia 2011  . Right shoulder injury 09/27/2016  . Sciatica    left leg pinched nerve numb at times about once a year    PAST SURGICAL HISTORY: Past Surgical History:  Procedure Laterality Date  . BILIARY STENT PLACEMENT N/A 09/01/2016   Procedure: BILIARY STENT PLACEMENT;  Surgeon: [name], MD;  [facility]: [facility];  Service: Endoscopy;  Laterality: N/A;  . DIAGNOSTIC LAPAROSCOPIC LIVER BIOPSY  09/16/2016   Procedure: DIAGNOSTIC LAPAROSCOPIC LIVER BIOPSY;  Surgeon: [name], MD;  Location: [place];  Service: General;;  . ERCP N/A 07/09/2016   Procedure: ENDOSCOPIC RETROGRADE CHOLANGIOPANCREATOGRAPHY (ERCP);  Surgeon: [name], MD;  Location: [facility];  Service: Endoscopy;  Laterality: N/A;  . ERCP N/A 09/01/2016   Procedure: ENDOSCOPIC RETROGRADE CHOLANGIOPANCREATOGRAPHY (ERCP);  Surgeon: [name], MD;  Location: [facility];  Service: Endoscopy;  Laterality: N/A;  . ERCP N/A 06/28/2017   Procedure: ENDOSCOPIC RETROGRADE CHOLANGIOPANCREATOGRAPHY (ERCP);  Surgeon: [name], MD;  Location[facility]COPY;  Service: Endoscopy;  Laterality: N/A;  . ESOPHAGOGASTRODUODENOSCOPY (EGD) WITH PROPOFOL N/A 09/01/2016   Procedure: ESOPHAGOGASTRODUODENOSCOPY (EGD) WITH PROPOFOL;  Surgeon: [name], MD;  [facility];  Service: Gastroenterology;  Laterality: N/A;  . EUS N/A 09/01/2016   Procedure:  ESOPHAGEAL ENDOSCOPIC ULTRASOUND (EUS) RADIAL;  Surgeon: [name], MD;  [facility];  Service: Endoscopy;   Laterality: N/A;  . FINE NEEDLE ASPIRATION N/A 09/01/2016   Procedure: FINE NEEDLE ASPIRATION (FNA) RADIAL;  Surgeon: [name], MD;  [facility];  Service: Endoscopy;  Laterality: N/A;  . FOREIGN BODY REMOVAL N/A 09/01/2016   Procedure: FOREIGN BODY REMOVAL;  Surgeon: [name], MD;  [facility];  Service: Gastroenterology;  Laterality: N/A;  . KNEE SURGERY Right 2002   arthroscopy  . PORTA CATH INSERTION N/A 09/20/2017   Procedure: PORTA CATH INSERTION;  Surgeon: [name], MD;  Location: [place] [facility];  Service: Cardiovascular;  Laterality: N/A;  . SHOULDER SURGERY Right 2002    FAMILY HISTORY: Family History  Problem Relation Age of Onset  . Heart disease Father     ADVANCED DIRECTIVES (Y/N):  N  HEALTH MAINTENANCE: Social History   Tobacco Use  . Smoking status: Current Every Day Smoker    Packs/day: 1.00    Years: 40.00    Pack years: 40.00    Types: Cigarettes  . Smokeless tobacco: Never Used  Substance Use Topics  . Alcohol use: Yes    Alcohol/week: 7.0 standard drinks    Types: 7 Cans of beer per week    Comment: last drink 5 weeeks ago  april 2018  . Drug use: No     Colonoscopy:  PAP:  Bone density:  Lipid panel:  Allergies  Allergen Reactions  . No Known Allergies     Current Outpatient Medications  Medication Sig Dispense Refill  . acetaminophen (TYLENOL) 500 MG tablet Take 1,000 mg by mouth every 8 (eight) hours as needed.    Marland Kitchen albuterol (PROVENTIL HFA;VENTOLIN HFA) 108 (90 Base) MCG/ACT inhaler Inhale 1-2 puffs into the lungs every 6 (six) hours as needed for wheezing or shortness of breath. 1 Inhaler 6  . Ipratropium-Albuterol (COMBIVENT RESPIMAT) 20-100 MCG/ACT AERS respimat Inhale 1 puff into the lungs 4 (four) times daily. 1 Inhaler 6  . megestrol (MEGACE) 40 MG tablet Take 1 tablet (40 mg total) by mouth daily. 30 tablet 2  . prochlorperazine (COMPAZINE) 10 MG tablet Take 1 tablet (10 mg total) by mouth  every 6 (six) hours as needed for nausea or vomiting. 60 tablet 0  . traMADol (ULTRAM) 50 MG tablet Take 1 tablet (50 mg total) by mouth every 6 (six) hours as needed. 30 tablet 0  . Oxycodone HCl 10 MG TABS Take 1 tablet (10 mg total) by mouth 2 (two) times daily as needed. 60 tablet 0  . zolpidem (AMBIEN) 10 MG tablet Take 1 tablet (10 mg total) by mouth at bedtime as needed for up to 30 days for sleep. 30 tablet 1   No current facility-administered medications for this visit.    OBJECTIVE: Vitals:   07/17/18 0904  BP: 130/84  Pulse: 67  Resp: 18  Temp: (!) 97.4 F (36.3 C)     Body mass index is 15.72 kg/m.    ECOG FS:1 - Symptomatic but completely ambulatory  General: Thin, no acute distress. Eyes: Pink conjunctiva, anicteric sclera. HEENT: Normocephalic, moist mucous membranes. Lungs: Clear to auscultation bilaterally. Heart: Regular rate and rhythm. No rubs, murmurs, or gallops. Abdomen: Soft, nontender, nondistended. No organomegaly noted, normoactive bowel sounds. Musculoskeletal: No edema, cyanosis, or clubbing. Neuro: Alert, answering all questions appropriately. Cranial nerves grossly intact. Skin: No rashes or petechiae noted. Psych: Normal affect.  LAB RESULTS:  Lab Results  Component Value Date   NA 133 (L) 07/17/2018   K 3.7 07/17/2018   CL 102 07/17/2018   CO2 23 07/17/2018   GLUCOSE 109 (H) 07/17/2018   BUN 8 07/17/2018   CREATININE 0.32 (L) 07/17/2018   CALCIUM 8.9 07/17/2018   PROT 7.1 07/17/2018   ALBUMIN 3.5 07/17/2018   AST 116 (H) 07/17/2018   ALT 153 (H) 07/17/2018   ALKPHOS 813 (H) 07/17/2018   BILITOT 0.8 07/17/2018   GFRNONAA >60 07/17/2018   GFRAA >60 07/17/2018    Lab Results  Component Value Date   WBC 9.8 07/17/2018   NEUTROABS 6.4 07/17/2018   HGB 12.5 (L) 07/17/2018   HCT 36.4 (L) 07/17/2018   MCV 102.2 (H) 07/17/2018   PLT 325 07/17/2018      STUDIES: No results found.  ASSESSMENT: Stage IV pancreatic cancer  PLAN:    1. Stage IV pancreatic cancer: Imaging, pathology, and Op note reviewed independently confirming stage IV disease with multiple peritoneal implants.  PET scan results from May 05, 2018 reviewed independently with clear progression of disease.  Patient's CA-19 9 also increased to 1395, but is now trending down and is 628, today's result is pending.  Patient wishes to continue treatment, but expressed understanding that his treatment options are limited.  Patient previously stated he has no interest in hospice and if chemotherapy were not an option he would like to pursue surgery or ablation.  We did discuss that this is likely not possible.  Patient has now discontinued capecitabine.  Proceed with cycyle 3, day 1 of single agent gemcitabine today. Return to clinic in 1 week for further evaluation and consideration of cycle 3, day 8.  Will consider reimaging at the conclusion of cycle 3.  2.  Elevated liver enzymes: AST and ALT elevated, but improved today.   Gemcitabine does not need to be dose reduced and liver dysfunction.  Continue to monitor closely.  Patient has not been evaluated by GI secondary to transportation issues.  Proceed with treatment as above. 3. Hyponatremia: Chronic and relatively unchanged. Sodium in 133 today. 4. Peripheral edema: Continue elevation nightly.  Lower extremity ultrasound did not reveal DVT.   5.  Elevated bilirubin: Resolved. 6.  Pain: Chronic and unchanged.  Patient was given a refill of his oxycodone today.  7.  Poor appetite: Patient has Megace prescribed, but is unclear his compliance.   8.  Shortness of breath: Chronic and unchanged.  Patient does not complain of this today.  Continue inhalers as prescribed. 9.  Shoulder pain/tremor: Patient was previously given a referral to orthopedics as he is received injections in the past that improved his symptoms. 10.  Insomnia: Improved with Ambien. 11.  Rash: Resolved.  Secondary gemcitabine.  Improved with Medrol  Dosepak.  Continue premedications as ordered for chemotherapy.  12.  Thrombocytopenia: Resolved.  Patient expressed understanding and was in agreement with this plan. He also understands that He can call clinic at any time with any questions, concerns, or complaints.   Cancer Staging Pancreatic cancer [facility]) Staging form: Exocrine Pancreas, AJCC 8th Edition - Clinical stage from 09/25/2016: Stage IV (cTX, cNX, pM1) - Signed by Lloyd Huger, MD on 09/25/2016   Lloyd Huger, MD   07/17/2018 1:11 PM

## 2018-07-18 LAB — CANCER ANTIGEN 19-9: CA 19-9: 343 U/mL — ABNORMAL HIGH (ref 0–35)

## 2018-07-24 ENCOUNTER — Other Ambulatory Visit: Payer: Self-pay

## 2018-07-24 ENCOUNTER — Inpatient Hospital Stay (HOSPITAL_BASED_OUTPATIENT_CLINIC_OR_DEPARTMENT_OTHER): Payer: Medicaid Other | Admitting: Oncology

## 2018-07-24 ENCOUNTER — Inpatient Hospital Stay: Payer: Medicaid Other

## 2018-07-24 VITALS — Temp 97.8°F

## 2018-07-24 VITALS — BP 122/81 | HR 82 | Resp 20 | Wt 112.0 lb

## 2018-07-24 DIAGNOSIS — R600 Localized edema: Secondary | ICD-10-CM

## 2018-07-24 DIAGNOSIS — E871 Hypo-osmolality and hyponatremia: Secondary | ICD-10-CM | POA: Diagnosis not present

## 2018-07-24 DIAGNOSIS — C259 Malignant neoplasm of pancreas, unspecified: Secondary | ICD-10-CM | POA: Diagnosis not present

## 2018-07-24 DIAGNOSIS — R630 Anorexia: Secondary | ICD-10-CM

## 2018-07-24 DIAGNOSIS — Z5111 Encounter for antineoplastic chemotherapy: Secondary | ICD-10-CM | POA: Diagnosis not present

## 2018-07-24 DIAGNOSIS — C786 Secondary malignant neoplasm of retroperitoneum and peritoneum: Secondary | ICD-10-CM | POA: Diagnosis not present

## 2018-07-24 DIAGNOSIS — Z95828 Presence of other vascular implants and grafts: Secondary | ICD-10-CM

## 2018-07-24 DIAGNOSIS — R5382 Chronic fatigue, unspecified: Secondary | ICD-10-CM

## 2018-07-24 DIAGNOSIS — R531 Weakness: Secondary | ICD-10-CM

## 2018-07-24 DIAGNOSIS — F17210 Nicotine dependence, cigarettes, uncomplicated: Secondary | ICD-10-CM

## 2018-07-24 LAB — CBC WITH DIFFERENTIAL/PLATELET
Abs Immature Granulocytes: 0.02 10*3/uL (ref 0.00–0.07)
Basophils Absolute: 0.1 10*3/uL (ref 0.0–0.1)
Basophils Relative: 1 %
Eosinophils Absolute: 0.2 10*3/uL (ref 0.0–0.5)
Eosinophils Relative: 4 %
HCT: 36.1 % — ABNORMAL LOW (ref 39.0–52.0)
Hemoglobin: 12.4 g/dL — ABNORMAL LOW (ref 13.0–17.0)
Immature Granulocytes: 0 %
Lymphocytes Relative: 30 %
Lymphs Abs: 1.5 10*3/uL (ref 0.7–4.0)
MCH: 35.3 pg — ABNORMAL HIGH (ref 26.0–34.0)
MCHC: 34.3 g/dL (ref 30.0–36.0)
MCV: 102.8 fL — ABNORMAL HIGH (ref 80.0–100.0)
Monocytes Absolute: 0.8 10*3/uL (ref 0.1–1.0)
Monocytes Relative: 16 %
Neutro Abs: 2.5 10*3/uL (ref 1.7–7.7)
Neutrophils Relative %: 49 %
Platelets: 244 10*3/uL (ref 150–400)
RBC: 3.51 MIL/uL — ABNORMAL LOW (ref 4.22–5.81)
RDW: 15.9 % — ABNORMAL HIGH (ref 11.5–15.5)
WBC: 5 10*3/uL (ref 4.0–10.5)
nRBC: 0 % (ref 0.0–0.2)

## 2018-07-24 LAB — COMPREHENSIVE METABOLIC PANEL
ALT: 144 U/L — ABNORMAL HIGH (ref 0–44)
AST: 102 U/L — ABNORMAL HIGH (ref 15–41)
Albumin: 3.6 g/dL (ref 3.5–5.0)
Alkaline Phosphatase: 803 U/L — ABNORMAL HIGH (ref 38–126)
Anion gap: 7 (ref 5–15)
BUN: 7 mg/dL (ref 6–20)
CO2: 22 mmol/L (ref 22–32)
Calcium: 8.9 mg/dL (ref 8.9–10.3)
Chloride: 103 mmol/L (ref 98–111)
Creatinine, Ser: 0.41 mg/dL — ABNORMAL LOW (ref 0.61–1.24)
GFR calc Af Amer: >60 mL/min (ref >60)
GFR calc non Af Amer: >60 mL/min (ref >60)
Glucose, Bld: 145 mg/dL — ABNORMAL HIGH (ref 70–99)
Potassium: 3.5 mmol/L (ref 3.5–5.1)
Sodium: 132 mmol/L — ABNORMAL LOW (ref 135–145)
Total Bilirubin: 0.7 mg/dL (ref 0.3–1.2)
Total Protein: 6.9 g/dL (ref 6.5–8.1)

## 2018-07-24 MED ORDER — METHYLPREDNISOLONE SODIUM SUCC 125 MG IJ SOLR
40.0000 mg | Freq: Once | INTRAMUSCULAR | Status: AC
  Administered 2018-07-24: 10:00:00 40 mg via INTRAVENOUS
  Filled 2018-07-24: qty 2

## 2018-07-24 MED ORDER — SODIUM CHLORIDE 0.9% FLUSH
10.0000 mL | Freq: Once | INTRAVENOUS | Status: AC
  Administered 2018-07-24: 10 mL via INTRAVENOUS
  Filled 2018-07-24: qty 10

## 2018-07-24 MED ORDER — SODIUM CHLORIDE 0.9 % IV SOLN
1600.0000 mg | Freq: Once | INTRAVENOUS | Status: AC
  Administered 2018-07-24: 11:00:00 1600 mg via INTRAVENOUS
  Filled 2018-07-24: qty 26.3

## 2018-07-24 MED ORDER — DIPHENHYDRAMINE HCL 50 MG/ML IJ SOLN
25.0000 mg | Freq: Once | INTRAMUSCULAR | Status: AC
  Administered 2018-07-24: 10:00:00 25 mg via INTRAVENOUS
  Filled 2018-07-24: qty 1

## 2018-07-24 MED ORDER — FAMOTIDINE IN NACL 20-0.9 MG/50ML-% IV SOLN
20.0000 mg | Freq: Once | INTRAVENOUS | Status: AC
  Administered 2018-07-24: 20 mg via INTRAVENOUS
  Filled 2018-07-24: qty 50

## 2018-07-24 MED ORDER — PROCHLORPERAZINE MALEATE 10 MG PO TABS
10.0000 mg | ORAL_TABLET | Freq: Once | ORAL | Status: AC
  Administered 2018-07-24: 10 mg via ORAL
  Filled 2018-07-24: qty 1

## 2018-07-24 MED ORDER — HEPARIN SOD (PORK) LOCK FLUSH 100 UNIT/ML IV SOLN
500.0000 [IU] | Freq: Once | INTRAVENOUS | Status: AC | PRN
  Administered 2018-07-24: 12:00:00 500 [IU]
  Filled 2018-07-24: qty 5

## 2018-07-24 MED ORDER — SODIUM CHLORIDE 0.9 % IV SOLN
Freq: Once | INTRAVENOUS | Status: AC
  Administered 2018-07-24: 10:00:00 via INTRAVENOUS
  Filled 2018-07-24: qty 250

## 2018-07-24 NOTE — Progress Notes (Signed)
[place]  Telephone:([phone] [phone]  ID: Benjamin Diaz. OB: 1960/04/25  MR#: [id number]  UUE#:[id number]  Patient Care Team: Patient, [name] as PCP - General (General Practice) Benjamin Napoleon, MD as Consulting Physician (Oncology) Benjamin Irani, MD (Inactive) as Consulting Physician (Gastroenterology) Benjamin Silence, MD as Consulting Physician (Gastroenterology)  CHIEF COMPLAINT: Stage IV pancreatic cancer  INTERVAL HISTORY: Patient returns to clinic today for further evaluation and consideration of cycle 3, day 8 of single agent gemcitabine.  He continues to tolerate his treatments well without significant side effects.  He currently feels well and is asymptomatic. He denies any further rash.  He no longer has insomnia. He denies any pain, but will occasionally take oxycodone for back pain. He has chronic weakness and fatigue that is unchanged. He has a poor appetite, but no weight loss.  He has no neurologic complaints.  He denies any recent fevers or illnesses.  He denies any chest pain, shortness of breath, cough, or hemoptysis.  He denies any nausea, vomiting, constipation, or diarrhea. He has no urinary complaints.  Patient offers no specific complaints today.  REVIEW OF SYSTEMS:   Review of Systems  Constitutional: Positive for malaise/fatigue. Negative for fever and weight loss.  Respiratory: Negative.  Negative for cough and shortness of breath.   Cardiovascular: Negative.  Negative for chest pain and leg swelling.  Gastrointestinal: Negative.  Negative for abdominal pain, diarrhea, nausea and vomiting.  Genitourinary: Negative.  Negative for dysuria, flank pain, frequency and urgency.  Musculoskeletal: Negative.  Negative for back pain and joint pain.  Skin: Negative.  Negative for itching and rash.  Neurological: Positive for weakness. Negative for tingling, tremors, sensory change and focal weakness.  Psychiatric/Behavioral:  Negative.  The patient is not nervous/anxious and does not have insomnia.     As per HPI. Otherwise, a complete review of systems is negative.  PAST MEDICAL HISTORY: Past Medical History:  Diagnosis Date  . Arthritis   . Asthma   . Atrial fibrillation ([place])   . Closed left ankle fracture age 20  . COPD (chronic obstructive pulmonary disease) ([place])   . Dysrhythmia   . Pancreatic cancer ([place]) 06/2016   Chemo tx's.   . Pneumonia 2011  . Right shoulder injury 09/27/2016  . Sciatica    left leg pinched nerve numb at times about once a year    PAST SURGICAL HISTORY: Past Surgical History:  Procedure Laterality Date  . BILIARY STENT PLACEMENT N/A 09/01/2016   Procedure: BILIARY STENT PLACEMENT;  Surgeon: Benjamin Silence, MD;  [facility];  Service: Endoscopy;  Laterality: N/A;  . DIAGNOSTIC LAPAROSCOPIC LIVER BIOPSY  09/16/2016   Procedure: DIAGNOSTIC LAPAROSCOPIC LIVER BIOPSY;  Surgeon: [name], MD;  Location: [place];  Service: General;;  . ERCP N/A 07/09/2016   Procedure: ENDOSCOPIC RETROGRADE CHOLANGIOPANCREATOGRAPHY (ERCP);  Surgeon: Benjamin Irani, MD;  Location: [facility];  Service: Endoscopy;  Laterality: N/A;  . ERCP N/A 09/01/2016   Procedure: ENDOSCOPIC RETROGRADE CHOLANGIOPANCREATOGRAPHY (ERCP);  Surgeon: Benjamin Silence, MD;  Location: [facility];  Service: Endoscopy;  Laterality: N/A;  . ERCP N/A 06/28/2017   Procedure: ENDOSCOPIC RETROGRADE CHOLANGIOPANCREATOGRAPHY (ERCP);  Surgeon: [name], MD;  Location: [facility] END[facility]COPY;  Service: Endoscopy;  Laterality: N/A;  . ESOPHAGOGASTRODUODENOSCOPY (EGD) WITH PROPOFOL N/A 09/01/2016   Procedure: ESOPHAGOGASTRODUODENOSCOPY (EGD) WITH PROPOFOL;  Surgeon: [name], MD;  [facility];  Service: Gastroenterology;  Laterality: N/A;  . EUS N/A 09/01/2016   Procedure: ESOPHAGEAL ENDOSCOPIC ULTRASOUND (  EUS) RADIAL;  Surgeon: Benjamin Silence, MD;  [facility];  Service: Endoscopy;  Laterality: N/A;  . FINE  NEEDLE ASPIRATION N/A 09/01/2016   Procedure: FINE NEEDLE ASPIRATION (FNA) RADIAL;  Surgeon: Benjamin Silence, MD;  [facility];  Service: Endoscopy;  Laterality: N/A;  . FOREIGN BODY REMOVAL N/A 09/01/2016   Procedure: FOREIGN BODY REMOVAL;  Surgeon: [name], MD;  [facility];  Service: Gastroenterology;  Laterality: N/A;  . KNEE SURGERY Right 2002   arthroscopy  . PORTA CATH INSERTION N/A 09/20/2017   Procedure: PORTA CATH INSERTION;  Surgeon: [name], MD;  Location: [place] [facility];  Service: Cardiovascular;  Laterality: N/A;  . SHOULDER SURGERY Right 2002    FAMILY HISTORY: Family History  Problem Relation Age of Onset  . Heart disease Father     ADVANCED DIRECTIVES (Y/N):  N  HEALTH MAINTENANCE: Social History   Tobacco Use  . Smoking status: Current Every Day Smoker    Packs/day: 1.00    Years: 40.00    Pack years: 40.00    Types: Cigarettes  . Smokeless tobacco: Never Used  Substance Use Topics  . Alcohol use: Yes    Alcohol/week: 7.0 standard drinks    Types: 7 Cans of beer per week    Comment: last drink 5 weeeks ago  april 2018  . Drug use: No     Colonoscopy:  PAP:  Bone density:  Lipid panel:  Allergies  Allergen Reactions  . No Known Allergies     Current Outpatient Medications  Medication Sig Dispense Refill  . acetaminophen (TYLENOL) 500 MG tablet Take 1,000 mg by mouth every 8 (eight) hours as needed.    Marland Kitchen albuterol (PROVENTIL HFA;VENTOLIN HFA) 108 (90 Base) MCG/ACT inhaler Inhale 1-2 puffs into the lungs every 6 (six) hours as needed for wheezing or shortness of breath. 1 Inhaler 6  . Ipratropium-Albuterol (COMBIVENT RESPIMAT) 20-100 MCG/ACT AERS respimat Inhale 1 puff into the lungs 4 (four) times daily. 1 Inhaler 6  . megestrol (MEGACE) 40 MG tablet Take 1 tablet (40 mg total) by mouth daily. 30 tablet 2  . Oxycodone HCl 10 MG TABS Take 1 tablet (10 mg total) by mouth 2 (two) times daily as needed. 60 tablet 0   . prochlorperazine (COMPAZINE) 10 MG tablet Take 1 tablet (10 mg total) by mouth every 6 (six) hours as needed for nausea or vomiting. 60 tablet 0  . traMADol (ULTRAM) 50 MG tablet Take 1 tablet (50 mg total) by mouth every 6 (six) hours as needed. 30 tablet 0  . zolpidem (AMBIEN) 10 MG tablet Take 1 tablet (10 mg total) by mouth at bedtime as needed for up to 30 days for sleep. 30 tablet 1   No current facility-administered medications for this visit.    Facility-Administered Medications Ordered in Other Visits  Medication Dose Route Frequency Provider Last Rate Last Dose  . famotidine (PEPCID) IVPB 20 mg premix  20 mg Intravenous Once Lloyd Huger, MD      . gemcitabine (GEMZAR) 1,600 mg in sodium chloride 0.9 % 250 mL chemo infusion  1,600 mg Intravenous Once Lloyd Huger, MD      . heparin lock flush 100 unit/mL  500 Units Intracatheter Once PRN Lloyd Huger, MD       OBJECTIVE: Vitals:   07/24/18 0857  BP: 122/81  Pulse: 82  Resp: 20     Body mass index is 15.19 kg/m.    ECOG FS:1 -  Symptomatic but completely ambulatory  General: Thin, no acute distress. Eyes: Pink conjunctiva, anicteric sclera. HEENT: Normocephalic, moist mucous membranes. Lungs: Clear to auscultation bilaterally. Heart: Regular rate and rhythm. No rubs, murmurs, or gallops. Abdomen: Soft, nontender, nondistended. No organomegaly noted, normoactive bowel sounds. Musculoskeletal: No edema, cyanosis, or clubbing. Neuro: Alert, answering all questions appropriately. Cranial nerves grossly intact. Skin: No rashes or petechiae noted. Psych: Normal affect.  LAB RESULTS:  Lab Results  Component Value Date   NA 132 (L) 07/24/2018   K 3.5 07/24/2018   CL 103 07/24/2018   CO2 22 07/24/2018   GLUCOSE 145 (H) 07/24/2018   BUN 7 07/24/2018   CREATININE 0.41 (L) 07/24/2018   CALCIUM 8.9 07/24/2018   PROT 6.9 07/24/2018   ALBUMIN 3.6 07/24/2018   AST 102 (H) 07/24/2018   ALT 144 (H)  07/24/2018   ALKPHOS 803 (H) 07/24/2018   BILITOT 0.7 07/24/2018   GFRNONAA >60 07/24/2018   GFRAA >60 07/24/2018    Lab Results  Component Value Date   WBC 5.0 07/24/2018   NEUTROABS 2.5 07/24/2018   HGB 12.4 (L) 07/24/2018   HCT 36.1 (L) 07/24/2018   MCV 102.8 (H) 07/24/2018   PLT 244 07/24/2018      STUDIES: No results found.  ASSESSMENT: Stage IV pancreatic cancer  PLAN:   1. Stage IV pancreatic cancer: Imaging, pathology, and Op note reviewed independently confirming stage IV disease with multiple peritoneal implants.  PET scan results from May 05, 2018 reviewed independently with clear progression of disease.  Patient's CA-19 9 also increased to 1395, but has now trended down and is 343. Patient wishes to continue treatment, but expressed understanding that his treatment options are limited.  Patient previously stated he has no interest in hospice and if chemotherapy were not an option he would like to pursue surgery or ablation.  We did discuss that this is likely not possible.  Patient has now discontinued capecitabine.  Proceed with cycle 3, day 8 of single agent gemcitabine today.  Return to clinic in 1 week for further evaluation and consideration of cycle 3, day 15.  Plan to reimage at the conclusion of cycle 3.   2.  Elevated liver enzymes: AST and ALT remain chronically elevated, monitor. Gemcitabine does not need to be dose reduced and liver dysfunction.  Continue to monitor closely.  Patient has not been evaluated by GI secondary to transportation issues.  Proceed with treatment as above. 3. Hyponatremia: Chronic and relatively unchanged.  Sodium is 132 today. 4. Peripheral edema: Continue elevation nightly.  Lower extremity ultrasound did not reveal DVT.   5.  Elevated bilirubin: Resolved. 6.  Pain: Chronic and unchanged.  Continue oxycodone as needed. 7.  Poor appetite: Patient has Megace prescribed, but is unclear his compliance.   8.  Shortness of breath:  Chronic and unchanged.  Patient does not complain of this today.  Continue inhalers as prescribed. 9.  Shoulder pain/tremor: Patient does not complain of this today.  Patient was previously given a referral to orthopedics as he is received injections in the past that improved his symptoms. 10.  Insomnia: Improved with Ambien. 11.  Rash: Resolved.  Secondary gemcitabine.  Improved with Medrol Dosepak.  Continue premedications as ordered for chemotherapy.  12.  Thrombocytopenia: Resolved.  Patient expressed understanding and was in agreement with this plan. He also understands that He can call clinic at any time with any questions, concerns, or complaints.   Cancer Staging Pancreatic cancer [facility]) Staging form:  Exocrine Pancreas, AJCC 8th Edition - Clinical stage from 09/25/2016: Stage IV (cTX, cNX, pM1) - Signed by Lloyd Huger, MD on 09/25/2016   Lloyd Huger, MD   07/24/2018 10:38 AM

## 2018-07-24 NOTE — Progress Notes (Signed)
Patient denies any concerns today.  

## 2018-07-30 NOTE — Progress Notes (Signed)
[place]  [phone] Fax:([phone]  ID: [name]. OB: 1960-12-14  MR#: [id number]  LPF#:[id number]  Patient Care Team: Patient, [name] as PCP - General (General Practice) [name], MD as Consulting Physician (Oncology) [name], MD (Inactive) as Consulting Physician (Gastroenterology) [name], MD as Consulting Physician (Gastroenterology)  CHIEF COMPLAINT: Stage IV pancreatic cancer  INTERVAL HISTORY: Patient returns clinic today for further evaluation and consideration of cycle 3, day 15 of single agent gemcitabine.  He has noted increased back pain this week, but otherwise feels well.  He continues to tolerate his treatments without significant side effects. He denies any further rash.  He no longer has insomnia. He has chronic weakness and fatigue that is unchanged. He has a poor appetite, but no weight loss.  He has no neurologic complaints.  He denies any recent fevers or illnesses.  He denies any chest pain, shortness of breath, cough, or hemoptysis.  He denies any nausea, vomiting, constipation, or diarrhea. He has no urinary complaints.  Patient offers no further specific complaints today.  REVIEW OF SYSTEMS:   Review of Systems  Constitutional: Positive for malaise/fatigue. Negative for fever and weight loss.  Respiratory: Negative.  Negative for cough and shortness of breath.   Cardiovascular: Negative.  Negative for chest pain and leg swelling.  Gastrointestinal: Negative.  Negative for abdominal pain, diarrhea, nausea and vomiting.  Genitourinary: Negative.  Negative for dysuria, flank pain, frequency and urgency.  Musculoskeletal: Positive for back pain. Negative for joint pain.  Skin: Negative.  Negative for itching and rash.  Neurological: Positive for weakness. Negative for tingling, tremors, sensory change and focal weakness.  Psychiatric/Behavioral: Negative.  The patient is not nervous/anxious and  does not have insomnia.     As per HPI. Otherwise, a complete review of systems is negative.  PAST MEDICAL HISTORY: Past Medical History:  Diagnosis Date  . Arthritis   . Asthma   . Atrial fibrillation ([place])   . Closed left ankle fracture age 88  . COPD (chronic obstructive pulmonary disease) ([place])   . Dysrhythmia   . Pancreatic cancer ([place]) 06/2016   Chemo tx's.   . Pneumonia 2011  . Right shoulder injury 09/27/2016  . Sciatica    left leg pinched nerve numb at times about once a year    PAST SURGICAL HISTORY: Past Surgical History:  Procedure Laterality Date  . BILIARY STENT PLACEMENT N/A 09/01/2016   Procedure: BILIARY STENT PLACEMENT;  Surgeon: [name], MD;  [facility]: [facility];  Service: Endoscopy;  Laterality: N/A;  . DIAGNOSTIC LAPAROSCOPIC LIVER BIOPSY  09/16/2016   Procedure: DIAGNOSTIC LAPAROSCOPIC LIVER BIOPSY;  Surgeon: [name], MD;  Location: [facility];  Service: General;;  . ERCP N/A 07/09/2016   Procedure: ENDOSCOPIC RETROGRADE CHOLANGIOPANCREATOGRAPHY (ERCP);  Surgeon: [name], MD;  Location: [facility];  Service: Endoscopy;  Laterality: N/A;  . ERCP N/A 09/01/2016   Procedure: ENDOSCOPIC RETROGRADE CHOLANGIOPANCREATOGRAPHY (ERCP);  Surgeon: [name], MD;  Location: [facility];  Service: Endoscopy;  Laterality: N/A;  . ERCP N/A 06/28/2017   Procedure: ENDOSCOPIC RETROGRADE CHOLANGIOPANCREATOGRAPHY (ERCP);  Surgeon: [name], MD;  Location: [facility] ENDOSCOPY;  Service: Endoscopy;  Laterality: N/A;  . ESOPHAGOGASTRODUODENOSCOPY (EGD) WITH PROPOFOL N/A 09/01/2016   Procedure: ESOPHAGOGASTRODUODENOSCOPY (EGD) WITH PROPOFOL;  Surgeon: [name], MD;  [facility];  Service: Gastroenterology;  Laterality: N/A;  . EUS N/A 09/01/2016   Procedure: ESOPHAGEAL ENDOSCOPIC ULTRASOUND (EUS) RADIAL;  Surgeon: [name], MD;  Location:  [facility];  Service: Endoscopy;  Laterality: N/A;  . FINE NEEDLE ASPIRATION N/A 09/01/2016   Procedure: FINE  NEEDLE ASPIRATION (FNA) RADIAL;  Surgeon: [name], MD;  [facility];  Service: Endoscopy;  Laterality: N/A;  . FOREIGN BODY REMOVAL N/A 09/01/2016   Procedure: FOREIGN BODY REMOVAL;  Surgeon: [name], MD;  [facility];  Service: Gastroenterology;  Laterality: N/A;  . KNEE SURGERY Right 2002   arthroscopy  . PORTA CATH INSERTION N/A 09/20/2017   Procedure: PORTA CATH INSERTION;  Surgeon: [name], MD;  Location: [place] [facility];  Service: Cardiovascular;  Laterality: N/A;  . SHOULDER SURGERY Right 2002    FAMILY HISTORY: Family History  Problem Relation Age of Onset  . Heart disease Father     ADVANCED DIRECTIVES (Y/N):  N  HEALTH MAINTENANCE: Social History   Tobacco Use  . Smoking status: Current Every Day Smoker    Packs/day: 1.00    Years: 40.00    Pack years: 40.00    Types: Cigarettes  . Smokeless tobacco: Never Used  Substance Use Topics  . Alcohol use: Yes    Alcohol/week: 7.0 standard drinks    Types: 7 Cans of beer per week    Comment: last drink 5 weeeks ago  april 2018  . Drug use: No     Colonoscopy:  PAP:  Bone density:  Lipid panel:  Allergies  Allergen Reactions  . No Known Allergies     Current Outpatient Medications  Medication Sig Dispense Refill  . acetaminophen (TYLENOL) 500 MG tablet Take 1,000 mg by mouth every 8 (eight) hours as needed.    Marland Kitchen albuterol (PROVENTIL HFA;VENTOLIN HFA) 108 (90 Base) MCG/ACT inhaler Inhale 1-2 puffs into the lungs every 6 (six) hours as needed for wheezing or shortness of breath. 1 Inhaler 6  . Ipratropium-Albuterol (COMBIVENT RESPIMAT) 20-100 MCG/ACT AERS respimat Inhale 1 puff into the lungs 4 (four) times daily. 1 Inhaler 6  . megestrol (MEGACE) 40 MG tablet Take 1 tablet (40 mg total) by mouth daily. 30 tablet 2  . Oxycodone HCl 10 MG TABS Take 1 tablet (10 mg total) by mouth 2 (two) times daily as needed. 60 tablet 0  . prochlorperazine (COMPAZINE) 10 MG tablet  Take 1 tablet (10 mg total) by mouth every 6 (six) hours as needed for nausea or vomiting. 60 tablet 0  . traMADol (ULTRAM) 50 MG tablet Take 1 tablet (50 mg total) by mouth every 6 (six) hours as needed. 30 tablet 0  . zolpidem (AMBIEN) 10 MG tablet Take 1 tablet (10 mg total) by mouth at bedtime as needed for up to 30 days for sleep. 30 tablet 0   No current facility-administered medications for this visit.    OBJECTIVE: Vitals:   07/31/18 0852  BP: 119/78  Pulse: 82  Resp: 18  Temp: 97.8 F (36.6 C)     Body mass index is 15.33 kg/m.    ECOG FS:1 - Symptomatic but completely ambulatory  General: Thin, no acute distress. Eyes: Pink conjunctiva, anicteric sclera. HEENT: Normocephalic, moist mucous membranes, clear oropharnyx. Lungs: Clear to auscultation bilaterally. Heart: Regular rate and rhythm. No rubs, murmurs, or gallops. Abdomen: Soft, nontender, nondistended. No organomegaly noted, normoactive bowel sounds. Musculoskeletal: No edema, cyanosis, or clubbing. Neuro: Alert, answering all questions appropriately. Cranial nerves grossly intact. Skin: No rashes or petechiae noted. Psych: Normal affect.  LAB RESULTS:  Lab Results  Component Value Date   NA 133 (L) 07/31/2018  K 3.8 07/31/2018   CL 103 07/31/2018   CO2 24 07/31/2018   GLUCOSE 113 (H) 07/31/2018   BUN 9 07/31/2018   CREATININE 0.35 (L) 07/31/2018   CALCIUM 9.1 07/31/2018   PROT 6.8 07/31/2018   ALBUMIN 3.6 07/31/2018   AST 141 (H) 07/31/2018   ALT 180 (H) 07/31/2018   ALKPHOS 802 (H) 07/31/2018   BILITOT 0.6 07/31/2018   GFRNONAA >60 07/31/2018   GFRAA >60 07/31/2018    Lab Results  Component Value Date   WBC 4.3 07/31/2018   NEUTROABS 2.3 07/31/2018   HGB 12.1 (L) 07/31/2018   HCT 34.8 (L) 07/31/2018   MCV 101.2 (H) 07/31/2018   PLT 100 (L) 07/31/2018      STUDIES: No results found.  ASSESSMENT: Stage IV pancreatic cancer  PLAN:   1. Stage IV pancreatic cancer: Imaging, pathology,  and Op note reviewed independently confirming stage IV disease with multiple peritoneal implants.  PET scan results from May 05, 2018 reviewed independently with clear progression of disease.  Patient's CA-19 9 also increased to 1395, but has now trended down and is 343. Patient wishes to continue treatment, but expressed understanding that his treatment options are limited.  Patient previously stated he has no interest in hospice and if chemotherapy were not an option he would like to pursue surgery or ablation.  We did discuss that this is likely not possible.  Patient has now discontinued capecitabine.  Proceed with cycle 3, day 15 of single agent gemcitabine.  Return to clinic in 2 weeks for further evaluation and consideration of cycle 4, day 1.  Will reimage with PET scan 1 to 2 days prior to next treatment.  2.  Elevated liver enzymes: AST and ALT remain chronically elevated, monitor. Gemcitabine does not need to be dose reduced and liver dysfunction.  Continue to monitor closely.  Patient has not been evaluated by GI secondary to transportation issues.  Proceed with treatment as above. 3. Hyponatremia: Chronic and relatively unchanged.  Sodium is 133 today. 4. Peripheral edema: Continue elevation nightly.  Lower extremity ultrasound did not reveal DVT.   5.  Elevated bilirubin: Resolved. 6.  Pain: Chronic and unchanged.  Continue oxycodone as needed. 7.  Poor appetite: Patient has Megace prescribed, but is unclear his compliance.   8.  Shortness of breath: Chronic and unchanged.  Patient does not complain of this today.  Continue inhalers as prescribed. 9.  Shoulder pain/tremor: Patient does not complain of this today.  Patient was previously given a referral to orthopedics as he is received injections in the past that improved his symptoms. 10.  Insomnia: Improved with Ambien. 11.  Rash: Resolved.  Secondary gemcitabine.  Improved with Medrol Dosepak.  Continue premedications as ordered for  chemotherapy.  12.  Thrombocytopenia: Mild, proceed with treatment as above.  I spent a total of 30 minutes face-to-face with the patient of which greater than 50% of the visit was spent in counseling and coordination of care as detailed above.   Patient expressed understanding and was in agreement with this plan. He also understands that He can call clinic at any time with any questions, concerns, or complaints.   Cancer Staging Pancreatic cancer [facility]) Staging form: Exocrine Pancreas, AJCC 8th Edition - Clinical stage from 09/25/2016: Stage IV (cTX, cNX, pM1) - Signed by Lloyd Huger, MD on 09/25/2016   Lloyd Huger, MD   07/31/2018 12:42 PM

## 2018-07-31 ENCOUNTER — Encounter: Payer: Self-pay | Admitting: Oncology

## 2018-07-31 ENCOUNTER — Inpatient Hospital Stay: Payer: Medicaid Other | Attending: Oncology

## 2018-07-31 ENCOUNTER — Other Ambulatory Visit: Payer: Self-pay

## 2018-07-31 ENCOUNTER — Other Ambulatory Visit: Payer: Self-pay | Admitting: *Deleted

## 2018-07-31 ENCOUNTER — Inpatient Hospital Stay (HOSPITAL_BASED_OUTPATIENT_CLINIC_OR_DEPARTMENT_OTHER): Payer: Medicaid Other | Admitting: Oncology

## 2018-07-31 ENCOUNTER — Inpatient Hospital Stay: Payer: Medicaid Other

## 2018-07-31 VITALS — BP 119/78 | HR 82 | Temp 97.8°F | Resp 18 | Wt 113.0 lb

## 2018-07-31 DIAGNOSIS — G8929 Other chronic pain: Secondary | ICD-10-CM

## 2018-07-31 DIAGNOSIS — F17210 Nicotine dependence, cigarettes, uncomplicated: Secondary | ICD-10-CM

## 2018-07-31 DIAGNOSIS — Z95828 Presence of other vascular implants and grafts: Secondary | ICD-10-CM

## 2018-07-31 DIAGNOSIS — Z5111 Encounter for antineoplastic chemotherapy: Secondary | ICD-10-CM | POA: Diagnosis present

## 2018-07-31 DIAGNOSIS — Z452 Encounter for adjustment and management of vascular access device: Secondary | ICD-10-CM | POA: Insufficient documentation

## 2018-07-31 DIAGNOSIS — R748 Abnormal levels of other serum enzymes: Secondary | ICD-10-CM | POA: Insufficient documentation

## 2018-07-31 DIAGNOSIS — E871 Hypo-osmolality and hyponatremia: Secondary | ICD-10-CM | POA: Diagnosis not present

## 2018-07-31 DIAGNOSIS — R630 Anorexia: Secondary | ICD-10-CM | POA: Insufficient documentation

## 2018-07-31 DIAGNOSIS — Z79891 Long term (current) use of opiate analgesic: Secondary | ICD-10-CM | POA: Diagnosis not present

## 2018-07-31 DIAGNOSIS — C259 Malignant neoplasm of pancreas, unspecified: Secondary | ICD-10-CM

## 2018-07-31 DIAGNOSIS — D696 Thrombocytopenia, unspecified: Secondary | ICD-10-CM

## 2018-07-31 LAB — CBC WITH DIFFERENTIAL/PLATELET
Abs Immature Granulocytes: 0.04 10*3/uL (ref 0.00–0.07)
Basophils Absolute: 0 10*3/uL (ref 0.0–0.1)
Basophils Relative: 1 %
Eosinophils Absolute: 0.1 10*3/uL (ref 0.0–0.5)
Eosinophils Relative: 2 %
HCT: 34.8 % — ABNORMAL LOW (ref 39.0–52.0)
Hemoglobin: 12.1 g/dL — ABNORMAL LOW (ref 13.0–17.0)
Immature Granulocytes: 1 %
Lymphocytes Relative: 31 %
Lymphs Abs: 1.4 10*3/uL (ref 0.7–4.0)
MCH: 35.2 pg — ABNORMAL HIGH (ref 26.0–34.0)
MCHC: 34.8 g/dL (ref 30.0–36.0)
MCV: 101.2 fL — ABNORMAL HIGH (ref 80.0–100.0)
Monocytes Absolute: 0.6 10*3/uL (ref 0.1–1.0)
Monocytes Relative: 13 %
Neutro Abs: 2.3 10*3/uL (ref 1.7–7.7)
Neutrophils Relative %: 52 %
Platelets: 100 10*3/uL — ABNORMAL LOW (ref 150–400)
RBC: 3.44 MIL/uL — ABNORMAL LOW (ref 4.22–5.81)
RDW: 15.9 % — ABNORMAL HIGH (ref 11.5–15.5)
WBC: 4.3 10*3/uL (ref 4.0–10.5)
nRBC: 0 % (ref 0.0–0.2)

## 2018-07-31 LAB — COMPREHENSIVE METABOLIC PANEL
ALT: 180 U/L — ABNORMAL HIGH (ref 0–44)
AST: 141 U/L — ABNORMAL HIGH (ref 15–41)
Albumin: 3.6 g/dL (ref 3.5–5.0)
Alkaline Phosphatase: 802 U/L — ABNORMAL HIGH (ref 38–126)
Anion gap: 6 (ref 5–15)
BUN: 9 mg/dL (ref 6–20)
CO2: 24 mmol/L (ref 22–32)
Calcium: 9.1 mg/dL (ref 8.9–10.3)
Chloride: 103 mmol/L (ref 98–111)
Creatinine, Ser: 0.35 mg/dL — ABNORMAL LOW (ref 0.61–1.24)
GFR calc Af Amer: >60 mL/min (ref >60)
GFR calc non Af Amer: >60 mL/min (ref >60)
Glucose, Bld: 113 mg/dL — ABNORMAL HIGH (ref 70–99)
Potassium: 3.8 mmol/L (ref 3.5–5.1)
Sodium: 133 mmol/L — ABNORMAL LOW (ref 135–145)
Total Bilirubin: 0.6 mg/dL (ref 0.3–1.2)
Total Protein: 6.8 g/dL (ref 6.5–8.1)

## 2018-07-31 MED ORDER — METHYLPREDNISOLONE SODIUM SUCC 125 MG IJ SOLR
40.0000 mg | Freq: Once | INTRAMUSCULAR | Status: AC
  Administered 2018-07-31: 40 mg via INTRAVENOUS
  Filled 2018-07-31: qty 2

## 2018-07-31 MED ORDER — SODIUM CHLORIDE 0.9 % IV SOLN
Freq: Once | INTRAVENOUS | Status: AC
  Administered 2018-07-31: 10:00:00 via INTRAVENOUS
  Filled 2018-07-31: qty 250

## 2018-07-31 MED ORDER — SODIUM CHLORIDE 0.9 % IV SOLN
1600.0000 mg | Freq: Once | INTRAVENOUS | Status: AC
  Administered 2018-07-31: 1600 mg via INTRAVENOUS
  Filled 2018-07-31: qty 26.3

## 2018-07-31 MED ORDER — HEPARIN SOD (PORK) LOCK FLUSH 100 UNIT/ML IV SOLN
500.0000 [IU] | Freq: Once | INTRAVENOUS | Status: AC | PRN
  Administered 2018-07-31: 500 [IU]
  Filled 2018-07-31: qty 5

## 2018-07-31 MED ORDER — ZOLPIDEM TARTRATE 10 MG PO TABS: 10.0000 mg | ORAL_TABLET | Freq: Every evening | ORAL | 0 refills | Status: DC | PRN

## 2018-07-31 MED ORDER — SODIUM CHLORIDE 0.9% FLUSH
10.0000 mL | Freq: Once | INTRAVENOUS | Status: AC
  Administered 2018-07-31: 09:00:00 10 mL via INTRAVENOUS
  Filled 2018-07-31: qty 10

## 2018-07-31 MED ORDER — PROCHLORPERAZINE MALEATE 10 MG PO TABS
10.0000 mg | ORAL_TABLET | Freq: Once | ORAL | Status: AC
  Administered 2018-07-31: 10 mg via ORAL
  Filled 2018-07-31: qty 1

## 2018-07-31 MED ORDER — DIPHENHYDRAMINE HCL 50 MG/ML IJ SOLN
25.0000 mg | Freq: Once | INTRAMUSCULAR | Status: AC
  Administered 2018-07-31: 25 mg via INTRAVENOUS
  Filled 2018-07-31: qty 1

## 2018-07-31 MED ORDER — FAMOTIDINE IN NACL 20-0.9 MG/50ML-% IV SOLN
20.0000 mg | Freq: Once | INTRAVENOUS | Status: AC
  Administered 2018-07-31: 20 mg via INTRAVENOUS
  Filled 2018-07-31: qty 50

## 2018-07-31 NOTE — Progress Notes (Signed)
ALT 180, ok to proceed per MD

## 2018-07-31 NOTE — Progress Notes (Signed)
Patient here today for follow up regarding pancreatic cancer. Patient reports new back pain to middle back, rates 2/10 this morning.

## 2018-08-08 ENCOUNTER — Other Ambulatory Visit: Payer: Self-pay | Admitting: *Deleted

## 2018-08-08 ENCOUNTER — Telehealth: Payer: Self-pay | Admitting: *Deleted

## 2018-08-08 DIAGNOSIS — C259 Malignant neoplasm of pancreas, unspecified: Secondary | ICD-10-CM

## 2018-08-08 NOTE — Progress Notes (Signed)
cc

## 2018-08-08 NOTE — Telephone Encounter (Signed)
Per Dr. Woodfin Ganja 08/08/18 staff message: Schedule a CT ABD and Pelvis CT was scheduled as requested. Called pt and made him aware of the location, Date and time of the scheduled 08/11/18 CT. Patient was also made aware to pick up prep and NPO 4 hrs prior.

## 2018-08-08 NOTE — Telephone Encounter (Signed)
  Per Margreta Journey  08/08/18 staff message to cancel PET Scan scheduled for 08/10/18 PET was cancelled as requested. I called pt and made him aware that the scheduled 08/10/18 PET had been cancelled.

## 2018-08-11 ENCOUNTER — Ambulatory Visit: Payer: Medicaid Other

## 2018-08-12 NOTE — Progress Notes (Signed)
[place]  [phone] [phone]  ID: Benjamin Diaz. OB: Apr 08, 1960  MR#: [id number]  XBL#:[id number]  Patient Care Team: Patient, [name] as PCP - General (General Practice) [name], MD as Consulting Physician (Oncology) [name], MD (Inactive) as Consulting Physician (Gastroenterology) [name], MD as Consulting Physician (Gastroenterology)  CHIEF COMPLAINT: Stage IV pancreatic cancer  INTERVAL HISTORY: Patient returns to clinic today for further evaluation and consideration of cycle 4, day 1 of single agent gemcitabine.  Restaging CT has not been completed yet secondary to insurance approval.  Patient currently feels well and is asymptomatic. He continues to tolerate his treatments without significant side effects. He denies any further rash.  He no longer has insomnia. He has chronic weakness and fatigue that is unchanged. He has a poor appetite, but no weight loss.  He has no neurologic complaints.  He denies any recent fevers or illnesses.  He denies any chest pain, shortness of breath, cough, or hemoptysis.  He denies any nausea, vomiting, constipation, or diarrhea. He has no urinary complaints.  Patient offers no further specific complaints today.    REVIEW OF SYSTEMS:   Review of Systems  Constitutional: Positive for malaise/fatigue. Negative for fever and weight loss.  Respiratory: Negative.  Negative for cough and shortness of breath.   Cardiovascular: Negative.  Negative for chest pain and leg swelling.  Gastrointestinal: Negative.  Negative for abdominal pain, diarrhea, nausea and vomiting.  Genitourinary: Negative.  Negative for dysuria, flank pain, frequency and urgency.  Musculoskeletal: Negative.  Negative for back pain and joint pain.  Skin: Negative.  Negative for itching and rash.  Neurological: Positive for weakness. Negative for tingling, tremors, sensory change and focal weakness.   Psychiatric/Behavioral: Negative.  The patient is not nervous/anxious and does not have insomnia.     As per HPI. Otherwise, a complete review of systems is negative.  PAST MEDICAL HISTORY: Past Medical History:  Diagnosis Date  . Arthritis   . Asthma   . Atrial fibrillation ([place])   . Closed left ankle fracture age 27  . COPD (chronic obstructive pulmonary disease) ([place])   . Dysrhythmia   . Pancreatic cancer ([place]) 06/2016   Chemo tx's.   . Pneumonia 2011  . Right shoulder injury 09/27/2016  . Sciatica    left leg pinched nerve numb at times about once a year    PAST SURGICAL HISTORY: Past Surgical History:  Procedure Laterality Date  . BILIARY STENT PLACEMENT N/A 09/01/2016   Procedure: BILIARY STENT PLACEMENT;  Surgeon: [name], MD;  [facility];  Service: Endoscopy;  Laterality: N/A;  . DIAGNOSTIC LAPAROSCOPIC LIVER BIOPSY  09/16/2016   Procedure: DIAGNOSTIC LAPAROSCOPIC LIVER BIOPSY;  Surgeon: [name], MD;  Location: [place];  Service: General;;  . ERCP N/A 07/09/2016   Procedure: ENDOSCOPIC RETROGRADE CHOLANGIOPANCREATOGRAPHY (ERCP);  Surgeon: [name], MD;  Location: [facility];  Service: Endoscopy;  Laterality: N/A;  . ERCP N/A 09/01/2016   Procedure: ENDOSCOPIC RETROGRADE CHOLANGIOPANCREATOGRAPHY (ERCP);  Surgeon: [name], MD;  Location: [facility];  Service: Endoscopy;  Laterality: N/A;  . ERCP N/A 06/28/2017   Procedure: ENDOSCOPIC RETROGRADE CHOLANGIOPANCREATOGRAPHY (ERCP);  Surgeon: [name], MD;  Location: [facility];  Service: Endoscopy;  Laterality: N/A;  . ESOPHAGOGASTRODUODENOSCOPY (EGD) WITH PROPOFOL N/A 09/01/2016   Procedure: ESOPHAGOGASTRODUODENOSCOPY (EGD) WITH PROPOFOL;  Surgeon: [name], MD;  [facility];  Service: Gastroenterology;  Laterality: N/A;  . EUS N/A 09/01/2016   Procedure: ESOPHAGEAL ENDOSCOPIC  ULTRASOUND (EUS) RADIAL;  Surgeon: [name], MD;  [facility];  Service: Endoscopy;   Laterality: N/A;  . FINE NEEDLE ASPIRATION N/A 09/01/2016   Procedure: FINE NEEDLE ASPIRATION (FNA) RADIAL;  Surgeon: [name], MD;  [facility];  Service: Endoscopy;  Laterality: N/A;  . FOREIGN BODY REMOVAL N/A 09/01/2016   Procedure: FOREIGN BODY REMOVAL;  Surgeon: [name], MD;  [facility];  Service: Gastroenterology;  Laterality: N/A;  . KNEE SURGERY Right 2002   arthroscopy  . PORTA CATH INSERTION N/A 09/20/2017   Procedure: PORTA CATH INSERTION;  Surgeon: [name], MD;  Location: [place] [facility];  Service: Cardiovascular;  Laterality: N/A;  . SHOULDER SURGERY Right 2002    FAMILY HISTORY: Family History  Problem Relation Age of Onset  . Heart disease Father     ADVANCED DIRECTIVES (Y/N):  N  HEALTH MAINTENANCE: Social History   Tobacco Use  . Smoking status: Current Every Day Smoker    Packs/day: 1.00    Years: 40.00    Pack years: 40.00    Types: Cigarettes  . Smokeless tobacco: Never Used  Substance Use Topics  . Alcohol use: Yes    Alcohol/week: 7.0 standard drinks    Types: 7 Cans of beer per week    Comment: last drink 5 weeeks ago  april 2018  . Drug use: No     Colonoscopy:  PAP:  Bone density:  Lipid panel:  Allergies  Allergen Reactions  . No Known Allergies     Current Outpatient Medications  Medication Sig Dispense Refill  . acetaminophen (TYLENOL) 500 MG tablet Take 1,000 mg by mouth every 8 (eight) hours as needed.    Marland Kitchen albuterol (PROVENTIL HFA;VENTOLIN HFA) 108 (90 Base) MCG/ACT inhaler Inhale 1-2 puffs into the lungs every 6 (six) hours as needed for wheezing or shortness of breath. 1 Inhaler 6  . Ipratropium-Albuterol (COMBIVENT RESPIMAT) 20-100 MCG/ACT AERS respimat Inhale 1 puff into the lungs 4 (four) times daily. 1 Inhaler 6  . megestrol (MEGACE) 40 MG tablet Take 1 tablet (40 mg total) by mouth daily. 30 tablet 2  . Oxycodone HCl 10 MG TABS Take 1 tablet (10 mg total) by mouth 2 (two) times  daily as needed. 60 tablet 0  . prochlorperazine (COMPAZINE) 10 MG tablet Take 1 tablet (10 mg total) by mouth every 6 (six) hours as needed for nausea or vomiting. 60 tablet 0  . traMADol (ULTRAM) 50 MG tablet Take 1 tablet (50 mg total) by mouth every 6 (six) hours as needed. 30 tablet 0  . zolpidem (AMBIEN) 10 MG tablet Take 1 tablet (10 mg total) by mouth at bedtime as needed for up to 30 days for sleep. 30 tablet 0   No current facility-administered medications for this visit.    OBJECTIVE: Vitals:   08/14/18 0944  BP: 117/82  Pulse: 75  Resp: 18  Temp: (!) 96.9 F (36.1 C)     Body mass index is 15.41 kg/m.    ECOG FS:1 - Symptomatic but completely ambulatory  General: Thin, no acute distress. Eyes: Pink conjunctiva, anicteric sclera. HEENT: Normocephalic, moist mucous membranes. Lungs: Clear to auscultation bilaterally. Heart: Regular rate and rhythm. No rubs, murmurs, or gallops. Abdomen: Soft, nontender, nondistended. No organomegaly noted, normoactive bowel sounds. Musculoskeletal: No edema, cyanosis, or clubbing. Neuro: Alert, answering all questions appropriately. Cranial nerves grossly intact. Skin: No rashes or petechiae noted. Psych: Normal affect.  LAB RESULTS:  Lab Results  Component Value  Date   NA 132 (L) 08/14/2018   K 4.0 08/14/2018   CL 101 08/14/2018   CO2 24 08/14/2018   GLUCOSE 108 (H) 08/14/2018   BUN 7 08/14/2018   CREATININE <0.30 (L) 08/14/2018   CALCIUM 8.8 (L) 08/14/2018   PROT 6.7 08/14/2018   ALBUMIN 3.5 08/14/2018   AST 54 (H) 08/14/2018   ALT 70 (H) 08/14/2018   ALKPHOS 725 (H) 08/14/2018   BILITOT 0.7 08/14/2018   GFRNONAA NOT CALCULATED 08/14/2018   GFRAA NOT CALCULATED 08/14/2018    Lab Results  Component Value Date   WBC 8.9 08/14/2018   NEUTROABS 5.6 08/14/2018   HGB 12.6 (L) 08/14/2018   HCT 37.2 (L) 08/14/2018   MCV 101.6 (H) 08/14/2018   PLT 364 08/14/2018      STUDIES: No results found.  ASSESSMENT: Stage IV  pancreatic cancer  PLAN:   1. Stage IV pancreatic cancer: Imaging, pathology, and Op note reviewed independently confirming stage IV disease with multiple peritoneal implants.  PET scan results from May 05, 2018 reviewed independently with clear progression of disease.  Patient's CA-19 9 also increased to 1395, but continues to trend down and is now 189. Patient wishes to continue treatment, but expressed understanding that his treatment options are limited.  Patient previously stated he has no interest in hospice and if chemotherapy were not an option he would like to pursue surgery or ablation.  We discussed that this is likely not possible.  Proceed with cycle 4, day 1 of single agent gemcitabine.  Return to clinic in 1 week for further evaluation and consideration of cycle 4, day 8.  We will get reimaging with CT scan when approved by insurance.    2.  Elevated liver enzymes: AST and ALT remain chronically elevated, monitor. Gemcitabine does not need to be dose reduced and liver dysfunction.  Continue to monitor closely.  Patient has not been evaluated by GI secondary to transportation issues.  Proceed with treatment as above. 3. Hyponatremia: Chronic and relatively unchanged.  Sodium is 132 today. 4. Peripheral edema: Continue elevation nightly.  Lower extremity ultrasound did not reveal DVT.   5.  Elevated bilirubin: Resolved. 6.  Pain: Chronic and unchanged.  Continue oxycodone as needed. 7.  Poor appetite: Patient has Megace prescribed, but is unclear his compliance.   8.  Shortness of breath: Chronic and unchanged.  Patient does not complain of this today.  Continue inhalers as prescribed. 9.  Shoulder pain/tremor: Patient does not complain of this today.  Patient was previously given a referral to orthopedics as he is received injections in the past that improved his symptoms. 10.  Insomnia: Improved with Ambien. 11.  Rash: Resolved.  Secondary gemcitabine.  Improved with Medrol Dosepak.   Continue premedications as ordered for chemotherapy.  12.  Thrombocytopenia: Resolved.    I spent a total of 30 minutes face-to-face with the patient of which greater than 50% of the visit was spent in counseling and coordination of care as detailed above.   Patient expressed understanding and was in agreement with this plan. He also understands that He can call clinic at any time with any questions, concerns, or complaints.   Cancer Staging Pancreatic cancer [facility]) Staging form: Exocrine Pancreas, AJCC 8th Edition - Clinical stage from 09/25/2016: Stage IV (cTX, cNX, pM1) - Signed by Lloyd Huger, MD on 09/25/2016   Lloyd Huger, MD   08/16/2018 6:37 AM

## 2018-08-14 ENCOUNTER — Inpatient Hospital Stay (HOSPITAL_BASED_OUTPATIENT_CLINIC_OR_DEPARTMENT_OTHER): Payer: Medicaid Other | Admitting: Oncology

## 2018-08-14 ENCOUNTER — Inpatient Hospital Stay: Payer: Medicaid Other

## 2018-08-14 ENCOUNTER — Other Ambulatory Visit: Payer: Self-pay

## 2018-08-14 VITALS — BP 117/82 | HR 75 | Temp 96.9°F | Resp 18 | Wt 113.6 lb

## 2018-08-14 DIAGNOSIS — E871 Hypo-osmolality and hyponatremia: Secondary | ICD-10-CM

## 2018-08-14 DIAGNOSIS — Z95828 Presence of other vascular implants and grafts: Secondary | ICD-10-CM

## 2018-08-14 DIAGNOSIS — C259 Malignant neoplasm of pancreas, unspecified: Secondary | ICD-10-CM

## 2018-08-14 DIAGNOSIS — F17210 Nicotine dependence, cigarettes, uncomplicated: Secondary | ICD-10-CM | POA: Diagnosis not present

## 2018-08-14 DIAGNOSIS — D696 Thrombocytopenia, unspecified: Secondary | ICD-10-CM

## 2018-08-14 DIAGNOSIS — R630 Anorexia: Secondary | ICD-10-CM

## 2018-08-14 DIAGNOSIS — G8929 Other chronic pain: Secondary | ICD-10-CM

## 2018-08-14 DIAGNOSIS — R748 Abnormal levels of other serum enzymes: Secondary | ICD-10-CM | POA: Diagnosis not present

## 2018-08-14 DIAGNOSIS — Z5111 Encounter for antineoplastic chemotherapy: Secondary | ICD-10-CM | POA: Diagnosis not present

## 2018-08-14 LAB — COMPREHENSIVE METABOLIC PANEL
ALT: 70 U/L — ABNORMAL HIGH (ref 0–44)
AST: 54 U/L — ABNORMAL HIGH (ref 15–41)
Albumin: 3.5 g/dL (ref 3.5–5.0)
Alkaline Phosphatase: 725 U/L — ABNORMAL HIGH (ref 38–126)
Anion gap: 7 (ref 5–15)
BUN: 7 mg/dL (ref 6–20)
CO2: 24 mmol/L (ref 22–32)
Calcium: 8.8 mg/dL — ABNORMAL LOW (ref 8.9–10.3)
Chloride: 101 mmol/L (ref 98–111)
Creatinine, Ser: <0.3 mg/dL — ABNORMAL LOW (ref 0.61–1.24)
Glucose, Bld: 108 mg/dL — ABNORMAL HIGH (ref 70–99)
Potassium: 4 mmol/L (ref 3.5–5.1)
Sodium: 132 mmol/L — ABNORMAL LOW (ref 135–145)
Total Bilirubin: 0.7 mg/dL (ref 0.3–1.2)
Total Protein: 6.7 g/dL (ref 6.5–8.1)

## 2018-08-14 LAB — CBC WITH DIFFERENTIAL/PLATELET
Abs Immature Granulocytes: 0.04 10*3/uL (ref 0.00–0.07)
Basophils Absolute: 0.1 10*3/uL (ref 0.0–0.1)
Basophils Relative: 1 %
Eosinophils Absolute: 0.3 10*3/uL (ref 0.0–0.5)
Eosinophils Relative: 4 %
HCT: 37.2 % — ABNORMAL LOW (ref 39.0–52.0)
Hemoglobin: 12.6 g/dL — ABNORMAL LOW (ref 13.0–17.0)
Immature Granulocytes: 0 %
Lymphocytes Relative: 16 %
Lymphs Abs: 1.4 10*3/uL (ref 0.7–4.0)
MCH: 34.4 pg — ABNORMAL HIGH (ref 26.0–34.0)
MCHC: 33.9 g/dL (ref 30.0–36.0)
MCV: 101.6 fL — ABNORMAL HIGH (ref 80.0–100.0)
Monocytes Absolute: 1.5 10*3/uL — ABNORMAL HIGH (ref 0.1–1.0)
Monocytes Relative: 17 %
Neutro Abs: 5.6 10*3/uL (ref 1.7–7.7)
Neutrophils Relative %: 62 %
Platelets: 364 10*3/uL (ref 150–400)
RBC: 3.66 MIL/uL — ABNORMAL LOW (ref 4.22–5.81)
RDW: 16.4 % — ABNORMAL HIGH (ref 11.5–15.5)
WBC: 8.9 10*3/uL (ref 4.0–10.5)
nRBC: 0 % (ref 0.0–0.2)

## 2018-08-14 MED ORDER — METHYLPREDNISOLONE SODIUM SUCC 125 MG IJ SOLR
40.0000 mg | Freq: Once | INTRAMUSCULAR | Status: AC
  Administered 2018-08-14: 40 mg via INTRAVENOUS
  Filled 2018-08-14: qty 2

## 2018-08-14 MED ORDER — SODIUM CHLORIDE 0.9% FLUSH
10.0000 mL | Freq: Once | INTRAVENOUS | Status: AC
  Administered 2018-08-14: 09:00:00 10 mL via INTRAVENOUS
  Filled 2018-08-14: qty 10

## 2018-08-14 MED ORDER — SODIUM CHLORIDE 0.9 % IV SOLN
Freq: Once | INTRAVENOUS | Status: AC
  Administered 2018-08-14: 10:00:00 via INTRAVENOUS
  Filled 2018-08-14: qty 250

## 2018-08-14 MED ORDER — HEPARIN SOD (PORK) LOCK FLUSH 100 UNIT/ML IV SOLN
500.0000 [IU] | Freq: Once | INTRAVENOUS | Status: AC | PRN
  Administered 2018-08-14: 500 [IU]
  Filled 2018-08-14: qty 5

## 2018-08-14 MED ORDER — PROCHLORPERAZINE MALEATE 10 MG PO TABS
10.0000 mg | ORAL_TABLET | Freq: Once | ORAL | Status: AC
  Administered 2018-08-14: 10 mg via ORAL
  Filled 2018-08-14: qty 1

## 2018-08-14 MED ORDER — DIPHENHYDRAMINE HCL 50 MG/ML IJ SOLN
25.0000 mg | Freq: Once | INTRAMUSCULAR | Status: AC
  Administered 2018-08-14: 25 mg via INTRAVENOUS
  Filled 2018-08-14: qty 1

## 2018-08-14 MED ORDER — FAMOTIDINE IN NACL 20-0.9 MG/50ML-% IV SOLN
20.0000 mg | Freq: Once | INTRAVENOUS | Status: AC
  Administered 2018-08-14: 20 mg via INTRAVENOUS
  Filled 2018-08-14: qty 50

## 2018-08-14 MED ORDER — SODIUM CHLORIDE 0.9 % IV SOLN
1600.0000 mg | Freq: Once | INTRAVENOUS | Status: AC
  Administered 2018-08-14: 1600 mg via INTRAVENOUS
  Filled 2018-08-14: qty 26.3

## 2018-08-15 LAB — CANCER ANTIGEN 19-9: CA 19-9: 189 U/mL — ABNORMAL HIGH (ref 0–35)

## 2018-08-19 ENCOUNTER — Other Ambulatory Visit: Payer: Self-pay | Admitting: Oncology

## 2018-08-21 NOTE — Progress Notes (Signed)
[place]  [phone] [phone]  ID: Benjamin Diaz. OB: 1961-01-31  MR#: [id number]  UXN#:[id number]  Patient Care Team: Patient, [name] as PCP - General (General Practice) [name], MD as Consulting Physician (Oncology) [name], MD (Inactive) as Consulting Physician (Gastroenterology) [name], MD as Consulting Physician (Gastroenterology)  CHIEF COMPLAINT: Stage IV pancreatic cancer  INTERVAL HISTORY: Patient returns to clinic today for further evaluation and consideration of cycle 4, day 8 of gemcitabine.  Restaging CT has not been completed yet secondary to insurance approval.  Patient currently feels well and is asymptomatic. He continues to tolerate his treatments without significant side effects. He denies any further rash.  He no longer has insomnia. He has chronic weakness and fatigue that is unchanged. He has a poor appetite, but no weight loss.  He has no neurologic complaints.  He denies any recent fevers or illnesses.  He denies any chest pain, shortness of breath, cough, or hemoptysis.  He denies any nausea, vomiting, constipation, or diarrhea. He has no urinary complaints.  Patient feels at his baseline and offers no further specific complaints today.  REVIEW OF SYSTEMS:   Review of Systems  Constitutional: Positive for malaise/fatigue. Negative for fever and weight loss.  Respiratory: Negative.  Negative for cough and shortness of breath.   Cardiovascular: Negative.  Negative for chest pain and leg swelling.  Gastrointestinal: Negative.  Negative for abdominal pain, diarrhea, nausea and vomiting.  Genitourinary: Negative.  Negative for dysuria, flank pain, frequency and urgency.  Musculoskeletal: Negative.  Negative for back pain and joint pain.  Skin: Negative.  Negative for itching and rash.  Neurological: Positive for weakness. Negative for tingling, tremors, sensory change and focal weakness.   Psychiatric/Behavioral: Negative.  The patient is not nervous/anxious and does not have insomnia.     As per HPI. Otherwise, a complete review of systems is negative.  PAST MEDICAL HISTORY: Past Medical History:  Diagnosis Date  . Arthritis   . Asthma   . Atrial fibrillation ([place])   . Closed left ankle fracture age 4  . COPD (chronic obstructive pulmonary disease) ([place])   . Dysrhythmia   . Pancreatic cancer ([place]) 06/2016   Chemo tx's.   . Pneumonia 2011  . Right shoulder injury 09/27/2016  . Sciatica    left leg pinched nerve numb at times about once a year    PAST SURGICAL HISTORY: Past Surgical History:  Procedure Laterality Date  . BILIARY STENT PLACEMENT N/A 09/01/2016   Procedure: BILIARY STENT PLACEMENT;  Surgeon: [name], MD;  Location: [facility];  Service: Endoscopy;  Laterality: N/A;  . DIAGNOSTIC LAPAROSCOPIC LIVER BIOPSY  09/16/2016   Procedure: DIAGNOSTIC LAPAROSCOPIC LIVER BIOPSY;  Surgeon: [name], MD;  Location: [place];  Service: General;;  . ERCP N/A 07/09/2016   Procedure: ENDOSCOPIC RETROGRADE CHOLANGIOPANCREATOGRAPHY (ERCP);  Surgeon: [name], MD;  Location: [facility];  Service: Endoscopy;  Laterality: N/A;  . ERCP N/A 09/01/2016   Procedure: ENDOSCOPIC RETROGRADE CHOLANGIOPANCREATOGRAPHY (ERCP);  Surgeon: [name], MD;  Location: [facility];  Service: Endoscopy;  Laterality: N/A;  . ERCP N/A 06/28/2017   Procedure: ENDOSCOPIC RETROGRADE CHOLANGIOPANCREATOGRAPHY (ERCP);  Surgeon: [name], MD;  Location: [facility] END[facility]COPY;  Service: Endoscopy;  Laterality: N/A;  . ESOPHAGOGASTRODUODENOSCOPY (EGD) WITH PROPOFOL N/A 09/01/2016   Procedure: ESOPHAGOGASTRODUODENOSCOPY (EGD) WITH PROPOFOL;  Surgeon: [name], MD;  [facility];  Service: Gastroenterology;  Laterality: N/A;  . EUS N/A 09/01/2016   Procedure: ESOPHAGEAL  ENDOSCOPIC ULTRASOUND (EUS) RADIAL;  Surgeon: [name], MD;  [facility];  Service: Endoscopy;   Laterality: N/A;  . FINE NEEDLE ASPIRATION N/A 09/01/2016   Procedure: FINE NEEDLE ASPIRATION (FNA) RADIAL;  Surgeon: [name], MD;  [facility];  Service: Endoscopy;  Laterality: N/A;  . FOREIGN BODY REMOVAL N/A 09/01/2016   Procedure: FOREIGN BODY REMOVAL;  Surgeon: [name], MD;  [facility];  Service: Gastroenterology;  Laterality: N/A;  . KNEE SURGERY Right 2002   arthroscopy  . PORTA CATH INSERTION N/A 09/20/2017   Procedure: PORTA CATH INSERTION;  Surgeon: [name], MD;  Location: [place] [facility];  Service: Cardiovascular;  Laterality: N/A;  . SHOULDER SURGERY Right 2002    FAMILY HISTORY: Family History  Problem Relation Age of Onset  . Heart disease Father     ADVANCED DIRECTIVES (Y/N):  N  HEALTH MAINTENANCE: Social History   Tobacco Use  . Smoking status: Current Every Day Smoker    Packs/day: 1.00    Years: 40.00    Pack years: 40.00    Types: Cigarettes  . Smokeless tobacco: Never Used  Substance Use Topics  . Alcohol use: Yes    Alcohol/week: 7.0 standard drinks    Types: 7 Cans of beer per week    Comment: last drink 5 weeeks ago  april 2018  . Drug use: No     Colonoscopy:  PAP:  Bone density:  Lipid panel:  Allergies  Allergen Reactions  . No Known Allergies     Current Outpatient Medications  Medication Sig Dispense Refill  . acetaminophen (TYLENOL) 500 MG tablet Take 1,000 mg by mouth every 8 (eight) hours as needed.    Marland Kitchen albuterol (PROVENTIL HFA;VENTOLIN HFA) 108 (90 Base) MCG/ACT inhaler Inhale 1-2 puffs into the lungs every 6 (six) hours as needed for wheezing or shortness of breath. 1 Inhaler 6  . Ipratropium-Albuterol (COMBIVENT RESPIMAT) 20-100 MCG/ACT AERS respimat Inhale 1 puff into the lungs 4 (four) times daily. 1 Inhaler 6  . megestrol (MEGACE) 40 MG tablet Take 1 tablet (40 mg total) by mouth daily. 30 tablet 2  . Oxycodone HCl 10 MG TABS Take 1 tablet (10 mg total) by mouth 2 (two) times  daily as needed. 60 tablet 0  . prochlorperazine (COMPAZINE) 10 MG tablet Take 1 tablet (10 mg total) by mouth every 6 (six) hours as needed for nausea or vomiting. 60 tablet 0  . traMADol (ULTRAM) 50 MG tablet Take 1 tablet (50 mg total) by mouth every 6 (six) hours as needed. 30 tablet 0  . zolpidem (AMBIEN) 10 MG tablet Take 1 tablet (10 mg total) by mouth at bedtime as needed for up to 30 days for sleep. 30 tablet 0   No current facility-administered medications for this visit.    OBJECTIVE: Vitals:   08/22/18 1041  BP: 120/78  Pulse: 87  Temp: 98.6 F (37 C)     Body mass index is 15.43 kg/m.    ECOG FS:1 - Symptomatic but completely ambulatory  General: Thin, no acute distress. Eyes: Pink conjunctiva, anicteric sclera. HEENT: Normocephalic, moist mucous membranes. Lungs: Clear to auscultation bilaterally. Heart: Regular rate and rhythm. No rubs, murmurs, or gallops. Abdomen: Soft, nontender, nondistended. No organomegaly noted, normoactive bowel sounds. Musculoskeletal: No edema, cyanosis, or clubbing. Neuro: Alert, answering all questions appropriately. Cranial nerves grossly intact. Skin: No rashes or petechiae noted. Psych: Normal affect.  LAB RESULTS:  Lab Results  Component Value Date  NA 133 (L) 08/22/2018   K 4.3 08/22/2018   CL 97 (L) 08/22/2018   CO2 27 08/22/2018   GLUCOSE 111 (H) 08/22/2018   BUN 7 08/22/2018   CREATININE 0.51 (L) 08/22/2018   CALCIUM 9.1 08/22/2018   PROT 7.1 08/22/2018   ALBUMIN 3.7 08/22/2018   AST 106 (H) 08/22/2018   ALT 121 (H) 08/22/2018   ALKPHOS 845 (H) 08/22/2018   BILITOT 0.6 08/22/2018   GFRNONAA >60 08/22/2018   GFRAA >60 08/22/2018    Lab Results  Component Value Date   WBC 6.7 08/22/2018   NEUTROABS 3.6 08/22/2018   HGB 13.4 08/22/2018   HCT 39.7 08/22/2018   MCV 100.8 (H) 08/22/2018   PLT 254 08/22/2018      STUDIES: No results found.  ASSESSMENT: Stage IV pancreatic cancer  PLAN:   1. Stage IV  pancreatic cancer: Imaging, pathology, and Op note reviewed independently confirming stage IV disease with multiple peritoneal implants.  PET scan results from May 05, 2018 reviewed independently with clear progression of disease.  Patient's CA-19 9 also increased to 1395, but continues to trend down and is now 189. Patient wishes to continue treatment, but expressed understanding that his treatment options are limited.  Patient previously stated he has no interest in hospice and if chemotherapy were not an option he would like to pursue surgery or ablation.  We discussed that this is likely not possible.  Because the CT has been delayed by insurance, will now schedule at the inclusion of cycle 4.  Proceed with cycle 4, day 8 of single agent gemcitabine.  Return to clinic in 1 week for further evaluation and consideration of cycle 4, day 15.  2.  Elevated liver enzymes: AST and ALT remain chronically elevated, monitor.  Slightly worse today.  Gemcitabine does not need to be dose reduced and liver dysfunction.  Continue to monitor closely.  Patient has not been evaluated by GI secondary to transportation issues.  Proceed with treatment as above. 3. Hyponatremia: Chronic and relatively unchanged.  Sodium is 133 today. 4. Peripheral edema: Continue elevation nightly.  Lower extremity ultrasound did not reveal DVT.   5.  Elevated bilirubin: Resolved. 6.  Pain: Chronic and unchanged.  Continue oxycodone as needed. 7.  Poor appetite: Patient has Megace prescribed, but is unclear his compliance.   8.  Shortness of breath: Chronic and unchanged.  Patient does not complain of this today.  Continue inhalers as prescribed. 9.  Shoulder pain/tremor: Patient does not complain of this today.  Patient was previously given a referral to orthopedics as he is received injections in the past that improved his symptoms. 10.  Insomnia: Improved with Ambien. 11.  Rash: Resolved.  Secondary gemcitabine.  Improved with Medrol  Dosepak.  Continue premedications as ordered for chemotherapy.  12.  Thrombocytopenia: Resolved.    I spent a total of 30 minutes face-to-face with the patient of which greater than 50% of the visit was spent in counseling and coordination of care as detailed above.   Patient expressed understanding and was in agreement with this plan. He also understands that He can call clinic at any time with any questions, concerns, or complaints.   Cancer Staging Pancreatic cancer [facility]) Staging form: Exocrine Pancreas, AJCC 8th Edition - Clinical stage from 09/25/2016: Stage IV (cTX, cNX, pM1) - Signed by Lloyd Huger, MD on 09/25/2016   Lloyd Huger, MD   08/23/2018 7:20 AM

## 2018-08-22 ENCOUNTER — Encounter: Payer: Self-pay | Admitting: Oncology

## 2018-08-22 ENCOUNTER — Inpatient Hospital Stay (HOSPITAL_BASED_OUTPATIENT_CLINIC_OR_DEPARTMENT_OTHER): Payer: Medicaid Other | Admitting: Oncology

## 2018-08-22 ENCOUNTER — Inpatient Hospital Stay: Payer: Medicaid Other

## 2018-08-22 ENCOUNTER — Other Ambulatory Visit: Payer: Self-pay

## 2018-08-22 VITALS — BP 120/78 | HR 87 | Temp 98.6°F | Ht 72.0 in | Wt 113.8 lb

## 2018-08-22 DIAGNOSIS — C259 Malignant neoplasm of pancreas, unspecified: Secondary | ICD-10-CM | POA: Diagnosis not present

## 2018-08-22 DIAGNOSIS — R609 Edema, unspecified: Secondary | ICD-10-CM | POA: Diagnosis not present

## 2018-08-22 DIAGNOSIS — R748 Abnormal levels of other serum enzymes: Secondary | ICD-10-CM

## 2018-08-22 DIAGNOSIS — E871 Hypo-osmolality and hyponatremia: Secondary | ICD-10-CM | POA: Diagnosis not present

## 2018-08-22 DIAGNOSIS — Z95828 Presence of other vascular implants and grafts: Secondary | ICD-10-CM

## 2018-08-22 DIAGNOSIS — Z5111 Encounter for antineoplastic chemotherapy: Secondary | ICD-10-CM | POA: Diagnosis not present

## 2018-08-22 DIAGNOSIS — R630 Anorexia: Secondary | ICD-10-CM

## 2018-08-22 LAB — CBC WITH DIFFERENTIAL/PLATELET
Abs Immature Granulocytes: 0.06 10*3/uL (ref 0.00–0.07)
Basophils Absolute: 0.1 10*3/uL (ref 0.0–0.1)
Basophils Relative: 1 %
Eosinophils Absolute: 0.3 10*3/uL (ref 0.0–0.5)
Eosinophils Relative: 4 %
HCT: 39.7 % (ref 39.0–52.0)
Hemoglobin: 13.4 g/dL (ref 13.0–17.0)
Immature Granulocytes: 1 %
Lymphocytes Relative: 24 %
Lymphs Abs: 1.6 10*3/uL (ref 0.7–4.0)
MCH: 34 pg (ref 26.0–34.0)
MCHC: 33.8 g/dL (ref 30.0–36.0)
MCV: 100.8 fL — ABNORMAL HIGH (ref 80.0–100.0)
Monocytes Absolute: 1 10*3/uL (ref 0.1–1.0)
Monocytes Relative: 15 %
Neutro Abs: 3.6 10*3/uL (ref 1.7–7.7)
Neutrophils Relative %: 55 %
Platelets: 254 10*3/uL (ref 150–400)
RBC: 3.94 MIL/uL — ABNORMAL LOW (ref 4.22–5.81)
RDW: 16.3 % — ABNORMAL HIGH (ref 11.5–15.5)
WBC: 6.7 10*3/uL (ref 4.0–10.5)
nRBC: 0 % (ref 0.0–0.2)

## 2018-08-22 LAB — COMPREHENSIVE METABOLIC PANEL
ALT: 121 U/L — ABNORMAL HIGH (ref 0–44)
AST: 106 U/L — ABNORMAL HIGH (ref 15–41)
Albumin: 3.7 g/dL (ref 3.5–5.0)
Alkaline Phosphatase: 845 U/L — ABNORMAL HIGH (ref 38–126)
Anion gap: 9 (ref 5–15)
BUN: 7 mg/dL (ref 6–20)
CO2: 27 mmol/L (ref 22–32)
Calcium: 9.1 mg/dL (ref 8.9–10.3)
Chloride: 97 mmol/L — ABNORMAL LOW (ref 98–111)
Creatinine, Ser: 0.51 mg/dL — ABNORMAL LOW (ref 0.61–1.24)
GFR calc Af Amer: >60 mL/min (ref >60)
GFR calc non Af Amer: >60 mL/min (ref >60)
Glucose, Bld: 111 mg/dL — ABNORMAL HIGH (ref 70–99)
Potassium: 4.3 mmol/L (ref 3.5–5.1)
Sodium: 133 mmol/L — ABNORMAL LOW (ref 135–145)
Total Bilirubin: 0.6 mg/dL (ref 0.3–1.2)
Total Protein: 7.1 g/dL (ref 6.5–8.1)

## 2018-08-22 MED ORDER — HEPARIN SOD (PORK) LOCK FLUSH 100 UNIT/ML IV SOLN: 500.0000 [IU] | Freq: Once | INTRAVENOUS | Status: DC

## 2018-08-22 MED ORDER — SODIUM CHLORIDE 0.9% FLUSH
10.0000 mL | Freq: Once | INTRAVENOUS | Status: AC
  Administered 2018-08-22: 10 mL via INTRAVENOUS
  Filled 2018-08-22: qty 10

## 2018-08-22 NOTE — Progress Notes (Signed)
Patient stated that he had been doing well with no complaints. 

## 2018-08-23 ENCOUNTER — Other Ambulatory Visit: Payer: Self-pay

## 2018-08-23 ENCOUNTER — Inpatient Hospital Stay: Payer: Medicaid Other

## 2018-08-23 VITALS — BP 102/70 | HR 86 | Temp 96.3°F | Resp 18

## 2018-08-23 DIAGNOSIS — Z5111 Encounter for antineoplastic chemotherapy: Secondary | ICD-10-CM | POA: Diagnosis not present

## 2018-08-23 DIAGNOSIS — C259 Malignant neoplasm of pancreas, unspecified: Secondary | ICD-10-CM

## 2018-08-23 LAB — CANCER ANTIGEN 19-9: CA 19-9: 180 U/mL — ABNORMAL HIGH (ref 0–35)

## 2018-08-23 MED ORDER — SODIUM CHLORIDE 0.9 % IV SOLN
1600.0000 mg | Freq: Once | INTRAVENOUS | Status: AC
  Administered 2018-08-23: 11:00:00 1600 mg via INTRAVENOUS
  Filled 2018-08-23: qty 26.3

## 2018-08-23 MED ORDER — SODIUM CHLORIDE 0.9% FLUSH
10.0000 mL | INTRAVENOUS | Status: DC | PRN
  Administered 2018-08-23: 10 mL via INTRAVENOUS
  Filled 2018-08-23: qty 10

## 2018-08-23 MED ORDER — FAMOTIDINE IN NACL 20-0.9 MG/50ML-% IV SOLN
20.0000 mg | Freq: Once | INTRAVENOUS | Status: AC
  Administered 2018-08-23: 20 mg via INTRAVENOUS
  Filled 2018-08-23: qty 50

## 2018-08-23 MED ORDER — DIPHENHYDRAMINE HCL 50 MG/ML IJ SOLN
25.0000 mg | Freq: Once | INTRAMUSCULAR | Status: AC
  Administered 2018-08-23: 25 mg via INTRAVENOUS
  Filled 2018-08-23: qty 1

## 2018-08-23 MED ORDER — PROCHLORPERAZINE MALEATE 10 MG PO TABS
10.0000 mg | ORAL_TABLET | Freq: Once | ORAL | Status: AC
  Administered 2018-08-23: 10:00:00 10 mg via ORAL
  Filled 2018-08-23: qty 1

## 2018-08-23 MED ORDER — HEPARIN SOD (PORK) LOCK FLUSH 100 UNIT/ML IV SOLN
500.0000 [IU] | Freq: Once | INTRAVENOUS | Status: AC
  Administered 2018-08-23: 12:00:00 500 [IU] via INTRAVENOUS
  Filled 2018-08-23: qty 5

## 2018-08-23 MED ORDER — SODIUM CHLORIDE 0.9 % IV SOLN
Freq: Once | INTRAVENOUS | Status: AC
  Administered 2018-08-23: 10:00:00 via INTRAVENOUS
  Filled 2018-08-23: qty 250

## 2018-08-23 MED ORDER — METHYLPREDNISOLONE SODIUM SUCC 125 MG IJ SOLR
40.0000 mg | Freq: Once | INTRAMUSCULAR | Status: AC
  Administered 2018-08-23: 10:00:00 40 mg via INTRAVENOUS
  Filled 2018-08-23: qty 2

## 2018-08-23 NOTE — Progress Notes (Signed)
Notified Dr. Grayland Ormond that Benjamin Diaz port has no blood return, but is flushing properly, no pain or swelling, or other issues noted.  Per Dr. Grayland Ormond, proceed with Gemzar today.

## 2018-08-23 NOTE — Progress Notes (Signed)
Prior to de-accessing patient's port, blood return noted.

## 2018-08-25 NOTE — Progress Notes (Signed)
[place]  Telephone:([phone] [phone]  ID: Benjamin Diaz. OB: [birth year]  MR#: [id number]  VZD#:[id number]  Patient Care Team: Patient, [name] as PCP - General (General Practice) [name], MD as Consulting Physician (Oncology) [name], MD (Inactive) as Consulting Physician (Gastroenterology) [name], MD as Consulting Physician (Gastroenterology)  CHIEF COMPLAINT: Stage IV pancreatic cancer  INTERVAL HISTORY: Patient comes to clinic today for further evaluation and consideration of cycle 4, day 15 of single agent gemcitabine.  He continues to feel well and remains asymptomatic.  He has chronic weakness and fatigue that is unchanged. He has a poor appetite, but no weight loss.  He has no neurologic complaints.  He denies any recent fevers or illnesses.  He denies any chest pain, shortness of breath, cough, or hemoptysis.  He denies any nausea, vomiting, constipation, or diarrhea. He has no urinary complaints.  Patient offers no further specific complaints today.  REVIEW OF SYSTEMS:   Review of Systems  Constitutional: Positive for malaise/fatigue. Negative for fever and weight loss.  Respiratory: Negative.  Negative for cough and shortness of breath.   Cardiovascular: Negative.  Negative for chest pain and leg swelling.  Gastrointestinal: Negative.  Negative for abdominal pain, diarrhea, nausea and vomiting.  Genitourinary: Negative.  Negative for dysuria, flank pain, frequency and urgency.  Musculoskeletal: Positive for back pain. Negative for joint pain.  Skin: Negative.  Negative for itching and rash.  Neurological: Positive for weakness. Negative for tingling, tremors, sensory change and focal weakness.  Psychiatric/Behavioral: Negative.  The patient is not nervous/anxious and does not have insomnia.     As per HPI. Otherwise, a complete review of systems is negative.  PAST MEDICAL HISTORY: Past Medical History:   Diagnosis Date  . Arthritis   . Asthma   . Atrial fibrillation ([place])   . Closed left ankle fracture age 14  . COPD (chronic obstructive pulmonary disease) ([place])   . Dysrhythmia   . Pancreatic cancer ([place]) 06/2016   Chemo tx's.   . Pneumonia 2011  . Right shoulder injury 09/27/2016  . Sciatica    left leg pinched nerve numb at times about once a year    PAST SURGICAL HISTORY: Past Surgical History:  Procedure Laterality Date  . BILIARY STENT PLACEMENT N/A 09/01/2016   Procedure: BILIARY STENT PLACEMENT;  Surgeon: [name], MD;  [facility];  Service: Endoscopy;  Laterality: N/A;  . DIAGNOSTIC LAPAROSCOPIC LIVER BIOPSY  09/16/2016   Procedure: DIAGNOSTIC LAPAROSCOPIC LIVER BIOPSY;  Surgeon: [name], MD;  Location: [place];  Service: General;;  . ERCP N/A 07/09/2016   Procedure: ENDOSCOPIC RETROGRADE CHOLANGIOPANCREATOGRAPHY (ERCP);  Surgeon: [name], MD;  Location: [facility];  Service: Endoscopy;  Laterality: N/A;  . ERCP N/A 09/01/2016   Procedure: ENDOSCOPIC RETROGRADE CHOLANGIOPANCREATOGRAPHY (ERCP);  Surgeon: [name], MD;  Location: [facility];  Service: Endoscopy;  Laterality: N/A;  . ERCP N/A 06/28/2017   Procedure: ENDOSCOPIC RETROGRADE CHOLANGIOPANCREATOGRAPHY (ERCP);  Surgeon: [name], MD;  Location: [facility] ENDOSCOPY;  Service: Endoscopy;  Laterality: N/A;  . ESOPHAGOGASTRODUODENOSCOPY (EGD) WITH PROPOFOL N/A 09/01/2016   Procedure: ESOPHAGOGASTRODUODENOSCOPY (EGD) WITH PROPOFOL;  Surgeon: [name], MD;  [facility]: [facility];  Service: Gastroenterology;  Laterality: N/A;  . EUS N/A 09/01/2016   Procedure: ESOPHAGEAL ENDOSCOPIC ULTRASOUND (EUS) RADIAL;  Surgeon: [name], MD;  [facility];  Service: Endoscopy;  Laterality: N/A;  . FINE NEEDLE ASPIRATION N/A 09/01/2016   Procedure: FINE NEEDLE ASPIRATION (FNA) RADIAL;  Surgeon: [name], MD;  Location: [facility];  Service: Endoscopy;  Laterality: N/A;  . FOREIGN BODY  REMOVAL N/A 09/01/2016   Procedure: FOREIGN BODY REMOVAL;  Surgeon: [name], MD;  [facility];  Service: Gastroenterology;  Laterality: N/A;  . KNEE SURGERY Right 2002   arthroscopy  . PORTA CATH INSERTION N/A 09/20/2017   Procedure: PORTA CATH INSERTION;  Surgeon: [name], MD;  Location: [place] [facility];  Service: Cardiovascular;  Laterality: N/A;  . SHOULDER SURGERY Right 2002    FAMILY HISTORY: Family History  Problem Relation Age of Onset  . Heart disease Father     ADVANCED DIRECTIVES (Y/N):  N  HEALTH MAINTENANCE: Social History   Tobacco Use  . Smoking status: Current Every Day Smoker    Packs/day: 1.00    Years: 40.00    Pack years: 40.00    Types: Cigarettes  . Smokeless tobacco: Never Used  Substance Use Topics  . Alcohol use: Yes    Alcohol/week: 7.0 standard drinks    Types: 7 Cans of beer per week    Comment: last drink 5 weeeks ago  april 2018  . Drug use: No     Colonoscopy:  PAP:  Bone density:  Lipid panel:  Allergies  Allergen Reactions  . No Known Allergies     Current Outpatient Medications  Medication Sig Dispense Refill  . acetaminophen (TYLENOL) 500 MG tablet Take 1,000 mg by mouth every 8 (eight) hours as needed.    Marland Kitchen albuterol (PROVENTIL HFA;VENTOLIN HFA) 108 (90 Base) MCG/ACT inhaler Inhale 1-2 puffs into the lungs every 6 (six) hours as needed for wheezing or shortness of breath. 1 Inhaler 6  . Ipratropium-Albuterol (COMBIVENT RESPIMAT) 20-100 MCG/ACT AERS respimat Inhale 1 puff into the lungs 4 (four) times daily. 1 Inhaler 6  . megestrol (MEGACE) 40 MG tablet Take 1 tablet (40 mg total) by mouth daily. 30 tablet 2  . Oxycodone HCl 10 MG TABS Take 1 tablet (10 mg total) by mouth 2 (two) times daily as needed. 60 tablet 0  . prochlorperazine (COMPAZINE) 10 MG tablet Take 1 tablet (10 mg total) by mouth every 6 (six) hours as needed for nausea or vomiting. 60 tablet 0  . traMADol (ULTRAM) 50 MG tablet Take 1  tablet (50 mg total) by mouth every 6 (six) hours as needed. 30 tablet 0  . zolpidem (AMBIEN) 10 MG tablet Take 1 tablet (10 mg total) by mouth at bedtime as needed for up to 30 days for sleep. 30 tablet 0   No current facility-administered medications for this visit.    OBJECTIVE: Vitals:   08/29/18 1058  BP: 126/80  Pulse: 75  Resp: 18     Body mass index is 15.33 kg/m.    ECOG FS:1 - Symptomatic but completely ambulatory  General: Thin, no acute distress. Eyes: Pink conjunctiva, anicteric sclera. HEENT: Normocephalic, moist mucous membranes. Lungs: Clear to auscultation bilaterally. Heart: Regular rate and rhythm. No rubs, murmurs, or gallops. Abdomen: Soft, nontender, nondistended. No organomegaly noted, normoactive bowel sounds. Musculoskeletal: No edema, cyanosis, or clubbing. Neuro: Alert, answering all questions appropriately. Cranial nerves grossly intact. Skin: No rashes or petechiae noted. Psych: Normal affect.  LAB RESULTS:  Lab Results  Component Value Date   NA 132 (L) 08/29/2018   K 3.9 08/29/2018   CL 100 08/29/2018   CO2 24 08/29/2018   GLUCOSE 118 (H) 08/29/2018   BUN 10 08/29/2018   CREATININE 0.45 (L) 08/29/2018  CALCIUM 8.9 08/29/2018   PROT 6.9 08/29/2018   ALBUMIN 3.7 08/29/2018   AST 126 (H) 08/29/2018   ALT 160 (H) 08/29/2018   ALKPHOS 808 (H) 08/29/2018   BILITOT 0.7 08/29/2018   GFRNONAA >60 08/29/2018   GFRAA >60 08/29/2018    Lab Results  Component Value Date   WBC 8.3 08/29/2018   NEUTROABS 6.2 08/29/2018   HGB 12.6 (L) 08/29/2018   HCT 36.8 (L) 08/29/2018   MCV 99.2 08/29/2018   PLT 135 (L) 08/29/2018      STUDIES: No results found.  ASSESSMENT: Stage IV pancreatic cancer  PLAN:   1. Stage IV pancreatic cancer: Imaging, pathology, and Op note reviewed independently confirming stage IV disease with multiple peritoneal implants.  PET scan results from May 05, 2018 reviewed independently with clear progression of  disease.  Patient's CA-19 9 also increased to 1395, but continues to trend down and is now 180. Patient wishes to continue treatment, but expressed understanding that his treatment options are limited.  Patient previously stated he has no interest in hospice and if chemotherapy were not an option he would like to pursue surgery or ablation.  We discussed that this is likely not possible.  CT scan has finally been approved by insurance, so this will be completed prior to cycle 5.  Proceed with cycle 4, day 15 today.  Return to clinic in 2 weeks for further evaluation, discussion of his imaging results, and consideration of cycle 5, day 1.  2.  Elevated liver enzymes: AST and ALT remain chronically elevated, monitor.  Slightly worse today.  Gemcitabine does not need to be dose reduced and liver dysfunction.  Continue to monitor closely.  Patient has not been evaluated by GI secondary to transportation issues.  Proceed with treatment as above. 3. Hyponatremia: Chronic and relatively unchanged.  Sodium is 132 today. 4. Peripheral edema: Continue elevation nightly.  Lower extremity ultrasound did not reveal DVT.   5.  Elevated bilirubin: Resolved. 6.  Pain: Chronic and unchanged.  Continue oxycodone as needed. 7.  Poor appetite: Patient has Megace prescribed, but is unclear his compliance.   8.  Shortness of breath: Chronic and unchanged.  Patient does not complain of this today.  Continue inhalers as prescribed. 9.  Shoulder pain/tremor: Patient does not complain of this today.  Patient was previously given a referral to orthopedics as he is received injections in the past that improved his symptoms. 10.  Insomnia: Improved with Ambien. 11.  Rash: Resolved.  Secondary gemcitabine.  Improved with Medrol Dosepak.  Continue premedications as ordered for chemotherapy.  12.  Thrombocytopenia: Mild, monitor.  Patient expressed understanding and was in agreement with this plan. He also understands that He can call  clinic at any time with any questions, concerns, or complaints.   Cancer Staging Pancreatic cancer [facility]) Staging form: Exocrine Pancreas, AJCC 8th Edition - Clinical stage from 09/25/2016: Stage IV (cTX, cNX, pM1) - Signed by Lloyd Huger, MD on 09/25/2016   Lloyd Huger, MD   08/30/2018 7:25 AM

## 2018-08-29 ENCOUNTER — Inpatient Hospital Stay: Payer: Medicaid Other | Attending: Oncology

## 2018-08-29 ENCOUNTER — Inpatient Hospital Stay (HOSPITAL_BASED_OUTPATIENT_CLINIC_OR_DEPARTMENT_OTHER): Payer: Medicaid Other | Admitting: Oncology

## 2018-08-29 ENCOUNTER — Inpatient Hospital Stay: Payer: Medicaid Other

## 2018-08-29 ENCOUNTER — Other Ambulatory Visit: Payer: Self-pay

## 2018-08-29 VITALS — BP 126/80 | HR 75 | Resp 18 | Wt 113.0 lb

## 2018-08-29 DIAGNOSIS — C786 Secondary malignant neoplasm of retroperitoneum and peritoneum: Secondary | ICD-10-CM | POA: Diagnosis not present

## 2018-08-29 DIAGNOSIS — Z5111 Encounter for antineoplastic chemotherapy: Secondary | ICD-10-CM | POA: Insufficient documentation

## 2018-08-29 DIAGNOSIS — C259 Malignant neoplasm of pancreas, unspecified: Secondary | ICD-10-CM

## 2018-08-29 DIAGNOSIS — R748 Abnormal levels of other serum enzymes: Secondary | ICD-10-CM

## 2018-08-29 DIAGNOSIS — E871 Hypo-osmolality and hyponatremia: Secondary | ICD-10-CM | POA: Diagnosis not present

## 2018-08-29 DIAGNOSIS — D696 Thrombocytopenia, unspecified: Secondary | ICD-10-CM | POA: Insufficient documentation

## 2018-08-29 DIAGNOSIS — R0602 Shortness of breath: Secondary | ICD-10-CM

## 2018-08-29 DIAGNOSIS — G8929 Other chronic pain: Secondary | ICD-10-CM | POA: Diagnosis not present

## 2018-08-29 DIAGNOSIS — R630 Anorexia: Secondary | ICD-10-CM | POA: Diagnosis not present

## 2018-08-29 DIAGNOSIS — G4700 Insomnia, unspecified: Secondary | ICD-10-CM | POA: Diagnosis not present

## 2018-08-29 DIAGNOSIS — R600 Localized edema: Secondary | ICD-10-CM

## 2018-08-29 DIAGNOSIS — F17210 Nicotine dependence, cigarettes, uncomplicated: Secondary | ICD-10-CM | POA: Diagnosis not present

## 2018-08-29 DIAGNOSIS — R5382 Chronic fatigue, unspecified: Secondary | ICD-10-CM | POA: Insufficient documentation

## 2018-08-29 DIAGNOSIS — Z95828 Presence of other vascular implants and grafts: Secondary | ICD-10-CM

## 2018-08-29 LAB — CBC WITH DIFFERENTIAL/PLATELET
Abs Immature Granulocytes: 0.03 10*3/uL (ref 0.00–0.07)
Basophils Absolute: 0.1 10*3/uL (ref 0.0–0.1)
Basophils Relative: 1 %
Eosinophils Absolute: 0.2 10*3/uL (ref 0.0–0.5)
Eosinophils Relative: 2 %
HCT: 36.8 % — ABNORMAL LOW (ref 39.0–52.0)
Hemoglobin: 12.6 g/dL — ABNORMAL LOW (ref 13.0–17.0)
Immature Granulocytes: 0 %
Lymphocytes Relative: 18 %
Lymphs Abs: 1.5 10*3/uL (ref 0.7–4.0)
MCH: 34 pg (ref 26.0–34.0)
MCHC: 34.2 g/dL (ref 30.0–36.0)
MCV: 99.2 fL (ref 80.0–100.0)
Monocytes Absolute: 0.4 10*3/uL (ref 0.1–1.0)
Monocytes Relative: 5 %
Neutro Abs: 6.2 10*3/uL (ref 1.7–7.7)
Neutrophils Relative %: 74 %
Platelets: 135 10*3/uL — ABNORMAL LOW (ref 150–400)
RBC: 3.71 MIL/uL — ABNORMAL LOW (ref 4.22–5.81)
RDW: 16.2 % — ABNORMAL HIGH (ref 11.5–15.5)
WBC: 8.3 10*3/uL (ref 4.0–10.5)
nRBC: 0 % (ref 0.0–0.2)

## 2018-08-29 LAB — COMPREHENSIVE METABOLIC PANEL
ALT: 160 U/L — ABNORMAL HIGH (ref 0–44)
AST: 126 U/L — ABNORMAL HIGH (ref 15–41)
Albumin: 3.7 g/dL (ref 3.5–5.0)
Alkaline Phosphatase: 808 U/L — ABNORMAL HIGH (ref 38–126)
Anion gap: 8 (ref 5–15)
BUN: 10 mg/dL (ref 6–20)
CO2: 24 mmol/L (ref 22–32)
Calcium: 8.9 mg/dL (ref 8.9–10.3)
Chloride: 100 mmol/L (ref 98–111)
Creatinine, Ser: 0.45 mg/dL — ABNORMAL LOW (ref 0.61–1.24)
GFR calc Af Amer: >60 mL/min (ref >60)
GFR calc non Af Amer: >60 mL/min (ref >60)
Glucose, Bld: 118 mg/dL — ABNORMAL HIGH (ref 70–99)
Potassium: 3.9 mmol/L (ref 3.5–5.1)
Sodium: 132 mmol/L — ABNORMAL LOW (ref 135–145)
Total Bilirubin: 0.7 mg/dL (ref 0.3–1.2)
Total Protein: 6.9 g/dL (ref 6.5–8.1)

## 2018-08-29 MED ORDER — FAMOTIDINE IN NACL 20-0.9 MG/50ML-% IV SOLN
20.0000 mg | Freq: Once | INTRAVENOUS | Status: AC
  Administered 2018-08-29: 20 mg via INTRAVENOUS
  Filled 2018-08-29: qty 50

## 2018-08-29 MED ORDER — HEPARIN SOD (PORK) LOCK FLUSH 100 UNIT/ML IV SOLN
INTRAVENOUS | Status: AC
  Filled 2018-08-29: qty 5

## 2018-08-29 MED ORDER — SODIUM CHLORIDE 0.9% FLUSH
10.0000 mL | Freq: Once | INTRAVENOUS | Status: AC
  Administered 2018-08-29: 10 mL via INTRAVENOUS
  Filled 2018-08-29: qty 10

## 2018-08-29 MED ORDER — DIPHENHYDRAMINE HCL 50 MG/ML IJ SOLN
25.0000 mg | Freq: Once | INTRAMUSCULAR | Status: AC
  Administered 2018-08-29: 25 mg via INTRAVENOUS
  Filled 2018-08-29: qty 1

## 2018-08-29 MED ORDER — PROCHLORPERAZINE MALEATE 10 MG PO TABS
10.0000 mg | ORAL_TABLET | Freq: Once | ORAL | Status: AC
  Administered 2018-08-29: 10 mg via ORAL
  Filled 2018-08-29: qty 1

## 2018-08-29 MED ORDER — SODIUM CHLORIDE 0.9 % IV SOLN
Freq: Once | INTRAVENOUS | Status: AC
  Administered 2018-08-29: 12:00:00 via INTRAVENOUS
  Filled 2018-08-29: qty 250

## 2018-08-29 MED ORDER — METHYLPREDNISOLONE SODIUM SUCC 125 MG IJ SOLR
40.0000 mg | Freq: Once | INTRAMUSCULAR | Status: AC
  Administered 2018-08-29: 40 mg via INTRAVENOUS
  Filled 2018-08-29: qty 2

## 2018-08-29 MED ORDER — HEPARIN SOD (PORK) LOCK FLUSH 100 UNIT/ML IV SOLN
500.0000 [IU] | Freq: Once | INTRAVENOUS | Status: AC
  Administered 2018-08-29: 500 [IU] via INTRAVENOUS

## 2018-08-29 MED ORDER — SODIUM CHLORIDE 0.9 % IV SOLN
1600.0000 mg | Freq: Once | INTRAVENOUS | Status: AC
  Administered 2018-08-29: 12:00:00 1600 mg via INTRAVENOUS
  Filled 2018-08-29: qty 26.3

## 2018-08-29 NOTE — Progress Notes (Signed)
Patient denies any concerns today.  

## 2018-08-29 NOTE — Progress Notes (Signed)
Dr. Grayland Ormond reviewed patient's labs, proceed with treatment today per Dr. Grayland Ormond

## 2018-09-10 NOTE — Progress Notes (Signed)
Benjamin Diaz  [phone] [phone]  ID: Benjamin Diaz. OB: 09/07/1960  MR#: [id number]  AOZ#:[id number]  Patient Care Team: Patient, [name] as PCP - General (General Practice) [name], MD as Consulting Physician (Oncology) [name], MD (Inactive) as Consulting Physician (Gastroenterology) [name], MD as Consulting Physician (Gastroenterology)  CHIEF COMPLAINT: Stage IV pancreatic cancer  INTERVAL HISTORY: Patient returns to clinic today for further evaluation, discussion of his imaging results, and consideration of cycle 5, day 1 of single agent gemcitabine.  He continues to feel well and at his baseline.  He has chronic weakness and fatigue that is unchanged. He has a poor appetite, but no weight loss.  He has no neurologic complaints.  He denies any recent fevers or illnesses.  He denies any chest pain, shortness of breath, cough, or hemoptysis.  He has no abdominal pain.  He denies any nausea, vomiting, constipation, or diarrhea. He has no urinary complaints.  Patient offers no further specific complaints today.  REVIEW OF SYSTEMS:   Review of Systems  Constitutional: Positive for malaise/fatigue. Negative for fever and weight loss.  Respiratory: Negative.  Negative for cough and shortness of breath.   Cardiovascular: Negative.  Negative for chest pain and leg swelling.  Gastrointestinal: Negative.  Negative for abdominal pain, diarrhea, nausea and vomiting.  Genitourinary: Negative.  Negative for dysuria, flank pain, frequency and urgency.  Musculoskeletal: Positive for back pain. Negative for joint pain.  Skin: Negative.  Negative for itching and rash.  Neurological: Positive for weakness. Negative for tingling, tremors, sensory change and focal weakness.  Psychiatric/Behavioral: Negative.  The patient is not nervous/anxious and does not have insomnia.     As per HPI. Otherwise, a complete review of systems is  negative.  PAST MEDICAL HISTORY: Past Medical History:  Diagnosis Date   Arthritis    Asthma    Atrial fibrillation (HCC)    Closed left ankle fracture age 81   COPD (chronic obstructive pulmonary disease) ([place])    Dysrhythmia    Pancreatic cancer ([place]) 06/2016   Chemo tx's.    Pneumonia 2011   Right shoulder injury 09/27/2016   Sciatica    left leg pinched nerve numb at times about once a year    PAST SURGICAL HISTORY: Past Surgical History:  Procedure Laterality Date   BILIARY STENT PLACEMENT N/A 09/01/2016   Procedure: BILIARY STENT PLACEMENT;  Surgeon: [name], MD;  Location: [facility];  Service: Endoscopy;  Laterality: N/A;   DIAGNOSTIC LAPAROSCOPIC LIVER BIOPSY  09/16/2016   Procedure: DIAGNOSTIC LAPAROSCOPIC LIVER BIOPSY;  Surgeon: [name], MD;  Location: [facility];  Service: General;;   ERCP N/A 07/09/2016   Procedure: ENDOSCOPIC RETROGRADE CHOLANGIOPANCREATOGRAPHY (ERCP);  Surgeon: [name], MD;  Location: [facility];  Service: Endoscopy;  Laterality: N/A;   ERCP N/A 09/01/2016   Procedure: ENDOSCOPIC RETROGRADE CHOLANGIOPANCREATOGRAPHY (ERCP);  Surgeon: [name], MD;  Location: [facility];  Service: Endoscopy;  Laterality: N/A;   ERCP N/A 06/28/2017   Procedure: ENDOSCOPIC RETROGRADE CHOLANGIOPANCREATOGRAPHY (ERCP);  Surgeon: [name], MD;  Location: [facility];  Service: Endoscopy;  Laterality: N/A;   ESOPHAGOGASTRODUODENOSCOPY (EGD) WITH PROPOFOL N/A 09/01/2016   Procedure: ESOPHAGOGASTRODUODENOSCOPY (EGD) WITH PROPOFOL;  Surgeon: [name], MD;  Location: [facility];  Service: Gastroenterology;  Laterality: N/A;   EUS N/A 09/01/2016   Procedure: ESOPHAGEAL ENDOSCOPIC ULTRASOUND (EUS) RADIAL;  Surgeon: [name], MD;  Location: [facility];  Service: Endoscopy;  Laterality: N/A;   FINE NEEDLE  ASPIRATION N/A 09/01/2016   Procedure: FINE NEEDLE ASPIRATION (FNA) RADIAL;  Surgeon: [name], MD;  Location: [facility];  Service: Endoscopy;  Laterality: N/A;   FOREIGN BODY REMOVAL N/A 09/01/2016   Procedure: FOREIGN BODY REMOVAL;  Surgeon: [name], MD;  Location: [facility];  Service: Gastroenterology;  Laterality: N/A;   KNEE SURGERY Right 2002   arthroscopy   PORTA CATH INSERTION N/A 09/20/2017   Procedure: PORTA CATH INSERTION;  Surgeon: [name], MD;  Location: [facility];  Service: Cardiovascular;  Laterality: N/A;   SHOULDER SURGERY Right 2002    FAMILY HISTORY: Family History  Problem Relation Age of Onset   Heart disease Father     ADVANCED DIRECTIVES (Y/N):  N  HEALTH MAINTENANCE: Social History   Tobacco Use   Smoking status: Current Every Day Smoker    Packs/day: 1.00    Years: 40.00    Pack years: 40.00    Types: Cigarettes   Smokeless tobacco: Never Used  Substance Use Topics   Alcohol use: Yes    Alcohol/week: 7.0 standard drinks    Types: 7 Cans of beer per week    Comment: last drink 5 weeeks ago  april 2018   Drug use: No     Colonoscopy:  PAP:  Bone density:  Lipid panel:  Allergies  Allergen Reactions   No Known Allergies     Current Outpatient Medications  Medication Sig Dispense Refill   acetaminophen (TYLENOL) 500 MG tablet Take 1,000 mg by mouth every 8 (eight) hours as needed.     albuterol (PROVENTIL HFA;VENTOLIN HFA) 108 (90 Base) MCG/ACT inhaler Inhale 1-2 puffs into the lungs every 6 (six) hours as needed for wheezing or shortness of breath. 1 Inhaler 6   Ipratropium-Albuterol (COMBIVENT RESPIMAT) 20-100 MCG/ACT AERS respimat Inhale 1 puff into the lungs 4 (four) times daily. 1 Inhaler 6   megestrol (MEGACE) 40 MG tablet Take 1 tablet (40 mg total) by mouth daily. 30 tablet 2   Oxycodone HCl 10 MG TABS Take 1 tablet (10 mg total) by mouth 2 (two) times daily as needed. 60 tablet 0   prochlorperazine (COMPAZINE) 10 MG tablet Take 1 tablet (10 mg total) by mouth every 6 (six) hours as needed for nausea or  vomiting. 60 tablet 0   traMADol (ULTRAM) 50 MG tablet Take 1 tablet (50 mg total) by mouth every 6 (six) hours as needed. 30 tablet 0   zolpidem (AMBIEN) 10 MG tablet Take 1 tablet (10 mg total) by mouth at bedtime as needed for up to 30 days for sleep. 30 tablet 0   No current facility-administered medications for this visit.    Facility-Administered Medications Ordered in Other Visits  Medication Dose Route Frequency Provider Last Rate Last Dose   famotidine (PEPCID) IVPB 20 mg premix  20 mg Intravenous Once Lloyd Huger, MD 200 mL/hr at 09/12/18 1001 20 mg at 09/12/18 1001   gemcitabine (GEMZAR) 1,600 mg in sodium chloride 0.9 % 250 mL chemo infusion  1,600 mg Intravenous Once Lloyd Huger, MD       heparin lock flush 100 unit/mL  500 Units Intravenous Once Lloyd Huger, MD       heparin lock flush 100 unit/mL  500 Units Intracatheter Once PRN Lloyd Huger, MD       sodium chloride flush (NS) 0.9 % injection 10 mL  10 mL Intravenous PRN Lloyd Huger, MD   10 mL at 09/12/18 0901  OBJECTIVE: Vitals:   09/12/18 0924  BP: 117/80  Pulse: 88  Resp: 18     Body mass index is 15.58 kg/m.    ECOG FS:0 - Asymptomatic  General: Thin, no acute distress. Eyes: Pink conjunctiva, anicteric sclera. HEENT: Normocephalic, moist mucous membranes, clear oropharnyx. Lungs: Clear to auscultation bilaterally. Heart: Regular rate and rhythm. No rubs, murmurs, or gallops. Abdomen: Soft, nontender, nondistended. No organomegaly noted, normoactive bowel sounds. Musculoskeletal: No edema, cyanosis, or clubbing. Neuro: Alert, answering all questions appropriately. Cranial nerves grossly intact. Skin: No rashes or petechiae noted. Psych: Normal affect.  LAB RESULTS:  Lab Results  Component Value Date   NA 131 (L) 09/12/2018   K 3.9 09/12/2018   CL 97 (L) 09/12/2018   CO2 24 09/12/2018   GLUCOSE 121 (H) 09/12/2018   BUN 5 (L) 09/12/2018   CREATININE 0.38  (L) 09/12/2018   CALCIUM 8.7 (L) 09/12/2018   PROT 6.8 09/12/2018   ALBUMIN 3.5 09/12/2018   AST 45 (H) 09/12/2018   ALT 57 (H) 09/12/2018   ALKPHOS 780 (H) 09/12/2018   BILITOT 0.8 09/12/2018   GFRNONAA >60 09/12/2018   GFRAA >60 09/12/2018    Lab Results  Component Value Date   WBC 8.4 09/12/2018   NEUTROABS 5.0 09/12/2018   HGB 12.6 (L) 09/12/2018   HCT 37.1 (L) 09/12/2018   MCV 97.9 09/12/2018   PLT 359 09/12/2018      STUDIES: Ct Abdomen Pelvis W Contrast  Result Date: 09/11/2018 CLINICAL DATA:  Stage IV pancreatic cancer with ongoing chemotherapy. Restaging. EXAM: CT ABDOMEN AND PELVIS WITH CONTRAST TECHNIQUE: Multidetector CT imaging of the abdomen and pelvis was performed using the standard protocol following bolus administration of intravenous contrast. CONTRAST:  22mL OMNIPAQUE IOHEXOL 300 MG/ML  SOLN COMPARISON:  05/05/2018 PET-CT. 04/03/2018 CT chest, abdomen and pelvis. FINDINGS: Lower chest: Stable solid 3 mm basilar left lower lobe pulmonary nodule (series 3/image 8). No acute abnormality at the lung bases. Hepatobiliary: Normal liver size. No discrete liver masses. Stable gallbladder without definite wall thickening or pericholecystic fluid or discrete nodularity. No radiopaque cholelithiasis. Stable mild diffuse intrahepatic biliary ductal dilatation with pneumobilia. Stable lower common bile duct stent position. CBD diameter 13 mm, stable. Pancreas: Hypoenhancing infiltrative posterior pancreatic head mass measures 4.2 x 2.4 cm (series 2/image 29), previously 4.2 x 2.6 cm on 04/03/2018 CT using similar measurement technique, not appreciably changed. Stable mild diffuse pancreatic duct dilation. Hypodense pancreatic tail 1.3 cm lesion (series 9/image 13), previously 1.3 cm, stable. No new pancreatic lesions. No peripancreatic fluid collections. Spleen: Normal size. No mass. Adrenals/Urinary Tract: Normal adrenals. Subcentimeter hypodense renal cortical lesion in the upper  right kidney, not definitely seen on prior, too small to characterize. Otherwise normal kidneys, with no hydronephrosis. Normal bladder. Stomach/Bowel: Normal non-distended stomach. Borderline mildly dilated small bowel loops throughout the abdomen up to 3.0 cm diameter. No focal small bowel caliber transition. No small bowel wall thickening or pneumatosis. Oral contrast transits to the pelvic small bowel. Normal appendix. Mild sigmoid diverticulosis, with no definite large bowel wall thickening or significant pericolonic fat stranding. Moderate colonic stool volume. Vascular/Lymphatic: Atherosclerotic nonaneurysmal abdominal aorta. Similar narrowing of the main portal vein, which remains patent. Patent splenic, renal and hepatic veins. Stable encasement of the proximal SMA by soft tissue. No pathologically enlarged lymph nodes in the abdomen or pelvis. Reproductive: Top-normal size prostate. Other: No pneumoperitoneum, ascites or focal fluid collection. Musculoskeletal: No aggressive appearing focal osseous lesions. Mild lower lumbar spondylosis. IMPRESSION: 1.  Posterior pancreatic head neoplasm has not appreciably changed since 04/03/2018 CT. Stable tumor involvement of the main portal vein and SMA. 2. No findings of metastatic disease in the abdomen or pelvis. 3. Stable bile ducts and CBD stent with pneumobilia indicating stent patency. 4. Nonspecific mildly dilated small bowel loops without discrete small bowel caliber transition or small bowel wall thickening, favor a mild adynamic ileus. 5.  Aortic Atherosclerosis (ICD10-I70.0). Electronically Signed   By: Ilona Sorrel M.D.   On: 09/11/2018 16:07    ASSESSMENT: Stage IV pancreatic cancer  PLAN:   1. Stage IV pancreatic cancer: Imaging, pathology, and Op note reviewed independently confirming stage IV disease with multiple peritoneal implants.  CT scan results from September 11, 2018 reviewed independently and reported as above with persistent disease in  patient's pancreas, but no other findings suggestive of metastatic disease in the abdomen or pelvis.  His CA-19-9 also continues to trend down and is now 180.  Patient wishes to continue treatment, but expressed understanding that his treatment options are limited.  Patient previously stated he has no interest in hospice and if chemotherapy were not an option he would like to pursue surgery or ablation.  We discussed that this is likely not possible.  Proceed with cycle 5, day 1 of gemcitabine today.  Return to clinic in 1 week for further evaluation and consideration of cycle 5, day 8. 2.  Elevated liver enzymes: AST and ALT remain chronically elevated, monitor.  Improved today..  Gemcitabine does not need to be dose reduced and liver dysfunction.  Patient has not been evaluated by GI secondary to transportation issues.  Proceed with treatment as above. 3. Hyponatremia: Chronic and unchanged.  Patient's sodium is 131 today. 4. Peripheral edema: Continue elevation nightly.  Lower extremity ultrasound did not reveal DVT.   5.  Elevated bilirubin: Resolved. 6.  Pain: Chronic and unchanged.  Continue oxycodone as needed. 7.  Poor appetite: Patient has Megace prescribed, but is unclear his compliance.   8.  Shortness of breath: Chronic and unchanged.  Patient does not complain of this today.  Continue inhalers as prescribed. 9.  Shoulder pain/tremor: Patient does not complain of this today.  Patient was previously given a referral to orthopedics as he is received injections in the past that improved his symptoms. 10.  Insomnia: Improved with Ambien. 11.  Rash: Resolved.  Secondary gemcitabine.  Improved with Medrol Dosepak.  Continue premedications as ordered for chemotherapy.  12.  Thrombocytopenia: Resolved.  Patient expressed understanding and was in agreement with this plan. He also understands that He can call clinic at any time with any questions, concerns, or complaints.   Cancer  Staging Pancreatic cancer [facility]) Staging form: Exocrine Pancreas, AJCC 8th Edition - Clinical stage from 09/25/2016: Stage IV (cTX, cNX, pM1) - Signed by Lloyd Huger, MD on 09/25/2016   Lloyd Huger, MD   09/12/2018 10:02 AM

## 2018-09-11 ENCOUNTER — Ambulatory Visit
Admission: RE | Admit: 2018-09-11 | Discharge: 2018-09-11 | Disposition: A | Discharge status: Home / Self Care | Payer: Medicaid Other | Source: Ambulatory Visit | Attending: Oncology | Admitting: Oncology

## 2018-09-11 ENCOUNTER — Other Ambulatory Visit: Payer: Self-pay

## 2018-09-11 DIAGNOSIS — C259 Malignant neoplasm of pancreas, unspecified: Secondary | ICD-10-CM | POA: Insufficient documentation

## 2018-09-11 MED ORDER — IOHEXOL 300 MG/ML  SOLN
75.0000 mL | Freq: Once | INTRAMUSCULAR | Status: AC | PRN
  Administered 2018-09-11: 75 mL via INTRAVENOUS

## 2018-09-12 ENCOUNTER — Inpatient Hospital Stay: Payer: Medicaid Other

## 2018-09-12 ENCOUNTER — Other Ambulatory Visit: Payer: Self-pay

## 2018-09-12 ENCOUNTER — Other Ambulatory Visit: Payer: Self-pay | Admitting: *Deleted

## 2018-09-12 ENCOUNTER — Inpatient Hospital Stay (HOSPITAL_BASED_OUTPATIENT_CLINIC_OR_DEPARTMENT_OTHER): Payer: Medicaid Other | Admitting: Oncology

## 2018-09-12 ENCOUNTER — Encounter: Payer: Self-pay | Admitting: Oncology

## 2018-09-12 VITALS — BP 117/80 | HR 88 | Resp 18 | Wt 114.9 lb

## 2018-09-12 DIAGNOSIS — C786 Secondary malignant neoplasm of retroperitoneum and peritoneum: Secondary | ICD-10-CM | POA: Diagnosis not present

## 2018-09-12 DIAGNOSIS — J449 Chronic obstructive pulmonary disease, unspecified: Secondary | ICD-10-CM

## 2018-09-12 DIAGNOSIS — G8929 Other chronic pain: Secondary | ICD-10-CM

## 2018-09-12 DIAGNOSIS — F17210 Nicotine dependence, cigarettes, uncomplicated: Secondary | ICD-10-CM

## 2018-09-12 DIAGNOSIS — E871 Hypo-osmolality and hyponatremia: Secondary | ICD-10-CM

## 2018-09-12 DIAGNOSIS — C259 Malignant neoplasm of pancreas, unspecified: Secondary | ICD-10-CM | POA: Diagnosis not present

## 2018-09-12 DIAGNOSIS — Z5111 Encounter for antineoplastic chemotherapy: Secondary | ICD-10-CM | POA: Diagnosis not present

## 2018-09-12 LAB — CBC WITH DIFFERENTIAL/PLATELET
Abs Immature Granulocytes: 0.02 10*3/uL (ref 0.00–0.07)
Basophils Absolute: 0.1 10*3/uL (ref 0.0–0.1)
Basophils Relative: 1 %
Eosinophils Absolute: 0.6 10*3/uL — ABNORMAL HIGH (ref 0.0–0.5)
Eosinophils Relative: 7 %
HCT: 37.1 % — ABNORMAL LOW (ref 39.0–52.0)
Hemoglobin: 12.6 g/dL — ABNORMAL LOW (ref 13.0–17.0)
Immature Granulocytes: 0 %
Lymphocytes Relative: 18 %
Lymphs Abs: 1.5 10*3/uL (ref 0.7–4.0)
MCH: 33.2 pg (ref 26.0–34.0)
MCHC: 34 g/dL (ref 30.0–36.0)
MCV: 97.9 fL (ref 80.0–100.0)
Monocytes Absolute: 1.3 10*3/uL — ABNORMAL HIGH (ref 0.1–1.0)
Monocytes Relative: 15 %
Neutro Abs: 5 10*3/uL (ref 1.7–7.7)
Neutrophils Relative %: 59 %
Platelets: 359 10*3/uL (ref 150–400)
RBC: 3.79 MIL/uL — ABNORMAL LOW (ref 4.22–5.81)
RDW: 17 % — ABNORMAL HIGH (ref 11.5–15.5)
WBC: 8.4 10*3/uL (ref 4.0–10.5)
nRBC: 0 % (ref 0.0–0.2)

## 2018-09-12 LAB — COMPREHENSIVE METABOLIC PANEL
ALT: 57 U/L — ABNORMAL HIGH (ref 0–44)
AST: 45 U/L — ABNORMAL HIGH (ref 15–41)
Albumin: 3.5 g/dL (ref 3.5–5.0)
Alkaline Phosphatase: 780 U/L — ABNORMAL HIGH (ref 38–126)
Anion gap: 10 (ref 5–15)
BUN: 5 mg/dL — ABNORMAL LOW (ref 6–20)
CO2: 24 mmol/L (ref 22–32)
Calcium: 8.7 mg/dL — ABNORMAL LOW (ref 8.9–10.3)
Chloride: 97 mmol/L — ABNORMAL LOW (ref 98–111)
Creatinine, Ser: 0.38 mg/dL — ABNORMAL LOW (ref 0.61–1.24)
GFR calc Af Amer: >60 mL/min (ref >60)
GFR calc non Af Amer: >60 mL/min (ref >60)
Glucose, Bld: 121 mg/dL — ABNORMAL HIGH (ref 70–99)
Potassium: 3.9 mmol/L (ref 3.5–5.1)
Sodium: 131 mmol/L — ABNORMAL LOW (ref 135–145)
Total Bilirubin: 0.8 mg/dL (ref 0.3–1.2)
Total Protein: 6.8 g/dL (ref 6.5–8.1)

## 2018-09-12 MED ORDER — METHYLPREDNISOLONE SODIUM SUCC 125 MG IJ SOLR
40.0000 mg | Freq: Once | INTRAMUSCULAR | Status: AC
  Administered 2018-09-12: 40 mg via INTRAVENOUS
  Filled 2018-09-12: qty 2

## 2018-09-12 MED ORDER — SODIUM CHLORIDE 0.9 % IV SOLN
1600.0000 mg | Freq: Once | INTRAVENOUS | Status: AC
  Administered 2018-09-12: 1600 mg via INTRAVENOUS
  Filled 2018-09-12: qty 26.3

## 2018-09-12 MED ORDER — HEPARIN SOD (PORK) LOCK FLUSH 100 UNIT/ML IV SOLN
500.0000 [IU] | Freq: Once | INTRAVENOUS | Status: AC | PRN
  Administered 2018-09-12: 500 [IU]

## 2018-09-12 MED ORDER — FAMOTIDINE IN NACL 20-0.9 MG/50ML-% IV SOLN
20.0000 mg | Freq: Once | INTRAVENOUS | Status: AC
  Administered 2018-09-12: 20 mg via INTRAVENOUS
  Filled 2018-09-12: qty 50

## 2018-09-12 MED ORDER — ALBUTEROL SULFATE HFA 108 (90 BASE) MCG/ACT IN AERS: 1.0000 | INHALATION_SPRAY | Freq: Four times a day (QID) | RESPIRATORY_TRACT | 6 refills | Status: AC | PRN

## 2018-09-12 MED ORDER — SODIUM CHLORIDE 0.9% FLUSH
10.0000 mL | INTRAVENOUS | Status: DC | PRN
  Administered 2018-09-12: 10 mL via INTRAVENOUS
  Filled 2018-09-12: qty 10

## 2018-09-12 MED ORDER — DIPHENHYDRAMINE HCL 50 MG/ML IJ SOLN
25.0000 mg | Freq: Once | INTRAMUSCULAR | Status: AC
  Administered 2018-09-12: 25 mg via INTRAVENOUS
  Filled 2018-09-12: qty 1

## 2018-09-12 MED ORDER — SODIUM CHLORIDE 0.9 % IV SOLN
Freq: Once | INTRAVENOUS | Status: AC
  Administered 2018-09-12: 10:00:00 via INTRAVENOUS
  Filled 2018-09-12: qty 250

## 2018-09-12 MED ORDER — PROCHLORPERAZINE MALEATE 10 MG PO TABS
10.0000 mg | ORAL_TABLET | Freq: Once | ORAL | Status: AC
  Administered 2018-09-12: 10 mg via ORAL
  Filled 2018-09-12: qty 1

## 2018-09-12 MED ORDER — HEPARIN SOD (PORK) LOCK FLUSH 100 UNIT/ML IV SOLN
500.0000 [IU] | Freq: Once | INTRAVENOUS | Status: DC
  Filled 2018-09-12: qty 5

## 2018-09-12 MED ORDER — OXYCODONE HCL 10 MG PO TABS: 10.0000 mg | ORAL_TABLET | Freq: Two times a day (BID) | ORAL | 0 refills | Status: DC | PRN

## 2018-09-12 NOTE — Progress Notes (Signed)
Patient denies any concerns today.  

## 2018-09-16 NOTE — Progress Notes (Signed)
[place]  [phone] [phone]  ID: Benjamin Diaz. OB: Nov 03, 1960  MR#: [id number]  EQA#:[id number]  Patient Care Team: Patient, [name] as PCP - General (General Practice) [name], MD as Consulting Physician (Oncology) [name], MD (Inactive) as Consulting Physician (Gastroenterology) [name], MD as Consulting Physician (Gastroenterology)  CHIEF COMPLAINT: Stage IV pancreatic cancer  INTERVAL HISTORY: Patient returns to clinic today for further evaluation and consideration of cycle 5, day 8 of single agent gemcitabine.  He continues to feel well and remains asymptomatic. He has chronic weakness and fatigue that is unchanged. He has a poor appetite, but no weight loss.  He has no neurologic complaints.  He denies any recent fevers or illnesses.  He denies any chest pain, shortness of breath, cough, or hemoptysis.  He has no abdominal pain.  He denies any nausea, vomiting, constipation, or diarrhea. He has no urinary complaints.  Patient offers no further specific complaints today.    REVIEW OF SYSTEMS:   Review of Systems  Constitutional: Positive for malaise/fatigue. Negative for fever and weight loss.  Respiratory: Negative.  Negative for cough and shortness of breath.   Cardiovascular: Negative.  Negative for chest pain and leg swelling.  Gastrointestinal: Negative.  Negative for abdominal pain, diarrhea, nausea and vomiting.  Genitourinary: Negative.  Negative for dysuria, flank pain, frequency and urgency.  Musculoskeletal: Positive for back pain. Negative for joint pain.  Skin: Negative.  Negative for itching and rash.  Neurological: Positive for weakness. Negative for tingling, tremors, sensory change and focal weakness.  Psychiatric/Behavioral: Negative.  The patient is not nervous/anxious and does not have insomnia.     As per HPI. Otherwise, a complete review of systems is negative.  PAST MEDICAL  HISTORY: Past Medical History:  Diagnosis Date   Arthritis    Asthma    Atrial fibrillation (HCC)    Closed left ankle fracture age 32   COPD (chronic obstructive pulmonary disease) ([place])    Dysrhythmia    Pancreatic cancer ([place]) 06/2016   Chemo tx's.    Pneumonia 2011   Right shoulder injury 09/27/2016   Sciatica    left leg pinched nerve numb at times about once a year    PAST SURGICAL HISTORY: Past Surgical History:  Procedure Laterality Date   BILIARY STENT PLACEMENT N/A 09/01/2016   Procedure: BILIARY STENT PLACEMENT;  Surgeon: [name], MD;  Location: [facility];  Service: Endoscopy;  Laterality: N/A;   DIAGNOSTIC LAPAROSCOPIC LIVER BIOPSY  09/16/2016   Procedure: DIAGNOSTIC LAPAROSCOPIC LIVER BIOPSY;  Surgeon: [name], MD;  Location: [facility];  Service: General;;   ERCP N/A 07/09/2016   Procedure: ENDOSCOPIC RETROGRADE CHOLANGIOPANCREATOGRAPHY (ERCP);  Surgeon: [name], MD;  Location: [facility];  Service: Endoscopy;  Laterality: N/A;   ERCP N/A 09/01/2016   Procedure: ENDOSCOPIC RETROGRADE CHOLANGIOPANCREATOGRAPHY (ERCP);  Surgeon: [name], MD;  Location: [facility];  Service: Endoscopy;  Laterality: N/A;   ERCP N/A 06/28/2017   Procedure: ENDOSCOPIC RETROGRADE CHOLANGIOPANCREATOGRAPHY (ERCP);  Surgeon: [name], MD;  Location: [facility];  Service: Endoscopy;  Laterality: N/A;   ESOPHAGOGASTRODUODENOSCOPY (EGD) WITH PROPOFOL N/A 09/01/2016   Procedure: ESOPHAGOGASTRODUODENOSCOPY (EGD) WITH PROPOFOL;  Surgeon: [name], MD;  Location: [facility];  Service: Gastroenterology;  Laterality: N/A;   EUS N/A 09/01/2016   Procedure: ESOPHAGEAL ENDOSCOPIC ULTRASOUND (EUS) RADIAL;  Surgeon: [name], MD;  Location: [facility];  Service: Endoscopy;  Laterality: N/A;   FINE NEEDLE ASPIRATION N/A 09/01/2016  Procedure: FINE NEEDLE ASPIRATION (FNA) RADIAL;  Surgeon: [name], MD;  Location: [facility];  Service: Endoscopy;   Laterality: N/A;   FOREIGN BODY REMOVAL N/A 09/01/2016   Procedure: FOREIGN BODY REMOVAL;  Surgeon: [name], MD;  Location: [facility];  Service: Gastroenterology;  Laterality: N/A;   KNEE SURGERY Right 2002   arthroscopy   PORTA CATH INSERTION N/A 09/20/2017   Procedure: PORTA CATH INSERTION;  Surgeon: [name], MD;  Location: [facility];  Service: Cardiovascular;  Laterality: N/A;   SHOULDER SURGERY Right 2002    FAMILY HISTORY: Family History  Problem Relation Age of Onset   Heart disease Father     ADVANCED DIRECTIVES (Y/N):  N  HEALTH MAINTENANCE: Social History   Tobacco Use   Smoking status: Current Every Day Smoker    Packs/day: 1.00    Years: 40.00    Pack years: 40.00    Types: Cigarettes   Smokeless tobacco: Never Used  Substance Use Topics   Alcohol use: Yes    Alcohol/week: 7.0 standard drinks    Types: 7 Cans of beer per week    Comment: last drink 5 weeeks ago  april 2018   Drug use: No     Colonoscopy:  PAP:  Bone density:  Lipid panel:  Allergies  Allergen Reactions   No Known Allergies     Current Outpatient Medications  Medication Sig Dispense Refill   acetaminophen (TYLENOL) 500 MG tablet Take 1,000 mg by mouth every 8 (eight) hours as needed.     albuterol (VENTOLIN HFA) 108 (90 Base) MCG/ACT inhaler Inhale 1-2 puffs into the lungs every 6 (six) hours as needed for wheezing or shortness of breath. 1 Inhaler 6   Ipratropium-Albuterol (COMBIVENT RESPIMAT) 20-100 MCG/ACT AERS respimat Inhale 1 puff into the lungs 4 (four) times daily. 1 Inhaler 6   megestrol (MEGACE) 40 MG tablet Take 1 tablet (40 mg total) by mouth daily. 30 tablet 2   Oxycodone HCl 10 MG TABS Take 1 tablet (10 mg total) by mouth 2 (two) times daily as needed. 60 tablet 0   prochlorperazine (COMPAZINE) 10 MG tablet Take 1 tablet (10 mg total) by mouth every 6 (six) hours as needed for nausea or vomiting. 60 tablet 0   traMADol (ULTRAM) 50  MG tablet Take 1 tablet (50 mg total) by mouth every 6 (six) hours as needed. 30 tablet 0   zolpidem (AMBIEN) 10 MG tablet Take 1 tablet (10 mg total) by mouth at bedtime as needed for up to 30 days for sleep. 30 tablet 0   No current facility-administered medications for this visit.    OBJECTIVE: Vitals:   09/19/18 1317  BP: 115/82  Pulse: 81  Temp: (!) 97.2 F (36.2 C)     Body mass index is 15.19 kg/m.    ECOG FS:0 - Asymptomatic  General: Thin, well-nourished, no acute distress. Eyes: Pink conjunctiva, anicteric sclera. HEENT: Normocephalic, moist mucous membranes. Lungs: Clear to auscultation bilaterally. Heart: Regular rate and rhythm. No rubs, murmurs, or gallops. Abdomen: Soft, nontender, nondistended. No organomegaly noted, normoactive bowel sounds. Musculoskeletal: No edema, cyanosis, or clubbing. Neuro: Alert, answering all questions appropriately. Cranial nerves grossly intact. Skin: No rashes or petechiae noted. Psych: Normal affect.  LAB RESULTS:  Lab Results  Component Value Date   NA 134 (L) 09/19/2018   K 4.2 09/19/2018   CL 99 09/19/2018   CO2 25 09/19/2018   GLUCOSE 103 (H) 09/19/2018   BUN 7 09/19/2018  CREATININE 0.47 (L) 09/19/2018   CALCIUM 8.9 09/19/2018   PROT 6.9 09/19/2018   ALBUMIN 3.7 09/19/2018   AST 87 (H) 09/19/2018   ALT 92 (H) 09/19/2018   ALKPHOS 751 (H) 09/19/2018   BILITOT 0.7 09/19/2018   GFRNONAA >60 09/19/2018   GFRAA >60 09/19/2018    Lab Results  Component Value Date   WBC 4.9 09/19/2018   NEUTROABS 2.1 09/19/2018   HGB 12.8 (L) 09/19/2018   HCT 36.9 (L) 09/19/2018   MCV 96.3 09/19/2018   PLT 290 09/19/2018      STUDIES: Ct Abdomen Pelvis W Contrast  Result Date: 09/11/2018 CLINICAL DATA:  Stage IV pancreatic cancer with ongoing chemotherapy. Restaging. EXAM: CT ABDOMEN AND PELVIS WITH CONTRAST TECHNIQUE: Multidetector CT imaging of the abdomen and pelvis was performed using the standard protocol following  bolus administration of intravenous contrast. CONTRAST:  15mL OMNIPAQUE IOHEXOL 300 MG/ML  SOLN COMPARISON:  05/05/2018 PET-CT. 04/03/2018 CT chest, abdomen and pelvis. FINDINGS: Lower chest: Stable solid 3 mm basilar left lower lobe pulmonary nodule (series 3/image 8). No acute abnormality at the lung bases. Hepatobiliary: Normal liver size. No discrete liver masses. Stable gallbladder without definite wall thickening or pericholecystic fluid or discrete nodularity. No radiopaque cholelithiasis. Stable mild diffuse intrahepatic biliary ductal dilatation with pneumobilia. Stable lower common bile duct stent position. CBD diameter 13 mm, stable. Pancreas: Hypoenhancing infiltrative posterior pancreatic head mass measures 4.2 x 2.4 cm (series 2/image 29), previously 4.2 x 2.6 cm on 04/03/2018 CT using similar measurement technique, not appreciably changed. Stable mild diffuse pancreatic duct dilation. Hypodense pancreatic tail 1.3 cm lesion (series 9/image 13), previously 1.3 cm, stable. No new pancreatic lesions. No peripancreatic fluid collections. Spleen: Normal size. No mass. Adrenals/Urinary Tract: Normal adrenals. Subcentimeter hypodense renal cortical lesion in the upper right kidney, not definitely seen on prior, too small to characterize. Otherwise normal kidneys, with no hydronephrosis. Normal bladder. Stomach/Bowel: Normal non-distended stomach. Borderline mildly dilated small bowel loops throughout the abdomen up to 3.0 cm diameter. No focal small bowel caliber transition. No small bowel wall thickening or pneumatosis. Oral contrast transits to the pelvic small bowel. Normal appendix. Mild sigmoid diverticulosis, with no definite large bowel wall thickening or significant pericolonic fat stranding. Moderate colonic stool volume. Vascular/Lymphatic: Atherosclerotic nonaneurysmal abdominal aorta. Similar narrowing of the main portal vein, which remains patent. Patent splenic, renal and hepatic veins. Stable  encasement of the proximal SMA by soft tissue. No pathologically enlarged lymph nodes in the abdomen or pelvis. Reproductive: Top-normal size prostate. Other: No pneumoperitoneum, ascites or focal fluid collection. Musculoskeletal: No aggressive appearing focal osseous lesions. Mild lower lumbar spondylosis. IMPRESSION: 1. Posterior pancreatic head neoplasm has not appreciably changed since 04/03/2018 CT. Stable tumor involvement of the main portal vein and SMA. 2. No findings of metastatic disease in the abdomen or pelvis. 3. Stable bile ducts and CBD stent with pneumobilia indicating stent patency. 4. Nonspecific mildly dilated small bowel loops without discrete small bowel caliber transition or small bowel wall thickening, favor a mild adynamic ileus. 5.  Aortic Atherosclerosis (ICD10-I70.0). Electronically Signed   By: Ilona Sorrel M.D.   On: 09/11/2018 16:07    ASSESSMENT: Stage IV pancreatic cancer  PLAN:   1. Stage IV pancreatic cancer: Imaging, pathology, and Op note reviewed independently confirming stage IV disease with multiple peritoneal implants.  CT scan results from September 11, 2018 reviewed independently and reported as above with persistent disease in patient's pancreas, but no other findings suggestive of metastatic disease in  the abdomen or pelvis.  His CA-19-9 also continues to trend down and is now 180.  Patient wishes to continue treatment, but expressed understanding that his treatment options are limited.  Patient previously stated he has no interest in hospice and if chemotherapy were not an option he would like to pursue surgery or ablation.  We discussed that this is likely not possible.  Proceed with cycle 5, day 8 of gemcitabine only today.  Return to clinic in 1 week for further evaluation and consideration of cycle 5, day 15. 2.  Elevated liver enzymes: AST and ALT remain chronically elevated and relatively unchanged. Gemcitabine does not need to be dose reduced and liver  dysfunction.  Patient has not been evaluated by GI secondary to transportation issues.  Proceed with treatment as above. 3. Hyponatremia: Chronic and unchanged.  Sodium is 134 today.   4. Peripheral edema: Continue elevation nightly.  Lower extremity ultrasound did not reveal DVT.   5.  Elevated bilirubin: Resolved. 6.  Pain: Chronic and unchanged.  Continue oxycodone as needed. 7.  Poor appetite: Patient has Megace prescribed, but is unclear his compliance.   8.  Shortness of breath: Chronic and unchanged.  Patient does not complain of this today.  Continue inhalers as prescribed. 9.  Shoulder pain/tremor: Patient does not complain of this today.  Patient was previously given a referral to orthopedics as he is received injections in the past that improved his symptoms. 10.  Insomnia: Improved with Ambien.  Patient was given a refill today. 11.  Rash: Resolved.  Secondary gemcitabine.  Improved with Medrol Dosepak.  Continue premedications as ordered for chemotherapy.  12.  Thrombocytopenia: Resolved.  Patient expressed understanding and was in agreement with this plan. He also understands that He can call clinic at any time with any questions, concerns, or complaints.   Cancer Staging Pancreatic cancer [facility]) Staging form: Exocrine Pancreas, AJCC 8th Edition - Clinical stage from 09/25/2016: Stage IV (cTX, cNX, pM1) - Signed by Lloyd Huger, MD on 09/25/2016   Lloyd Huger, MD   09/20/2018 12:21 PM

## 2018-09-19 ENCOUNTER — Encounter: Payer: Self-pay | Admitting: Oncology

## 2018-09-19 ENCOUNTER — Other Ambulatory Visit: Payer: Self-pay

## 2018-09-19 ENCOUNTER — Inpatient Hospital Stay (HOSPITAL_BASED_OUTPATIENT_CLINIC_OR_DEPARTMENT_OTHER): Payer: Medicaid Other | Admitting: Oncology

## 2018-09-19 ENCOUNTER — Inpatient Hospital Stay: Payer: Medicaid Other

## 2018-09-19 VITALS — BP 115/82 | HR 81 | Temp 97.2°F | Ht 72.0 in | Wt 112.0 lb

## 2018-09-19 DIAGNOSIS — C259 Malignant neoplasm of pancreas, unspecified: Secondary | ICD-10-CM

## 2018-09-19 DIAGNOSIS — R5382 Chronic fatigue, unspecified: Secondary | ICD-10-CM

## 2018-09-19 DIAGNOSIS — Z95828 Presence of other vascular implants and grafts: Secondary | ICD-10-CM

## 2018-09-19 DIAGNOSIS — Z5111 Encounter for antineoplastic chemotherapy: Secondary | ICD-10-CM | POA: Diagnosis not present

## 2018-09-19 DIAGNOSIS — F17210 Nicotine dependence, cigarettes, uncomplicated: Secondary | ICD-10-CM

## 2018-09-19 DIAGNOSIS — C786 Secondary malignant neoplasm of retroperitoneum and peritoneum: Secondary | ICD-10-CM | POA: Diagnosis not present

## 2018-09-19 LAB — COMPREHENSIVE METABOLIC PANEL
ALT: 92 U/L — ABNORMAL HIGH (ref 0–44)
AST: 87 U/L — ABNORMAL HIGH (ref 15–41)
Albumin: 3.7 g/dL (ref 3.5–5.0)
Alkaline Phosphatase: 751 U/L — ABNORMAL HIGH (ref 38–126)
Anion gap: 10 (ref 5–15)
BUN: 7 mg/dL (ref 6–20)
CO2: 25 mmol/L (ref 22–32)
Calcium: 8.9 mg/dL (ref 8.9–10.3)
Chloride: 99 mmol/L (ref 98–111)
Creatinine, Ser: 0.47 mg/dL — ABNORMAL LOW (ref 0.61–1.24)
GFR calc Af Amer: >60 mL/min (ref >60)
GFR calc non Af Amer: >60 mL/min (ref >60)
Glucose, Bld: 103 mg/dL — ABNORMAL HIGH (ref 70–99)
Potassium: 4.2 mmol/L (ref 3.5–5.1)
Sodium: 134 mmol/L — ABNORMAL LOW (ref 135–145)
Total Bilirubin: 0.7 mg/dL (ref 0.3–1.2)
Total Protein: 6.9 g/dL (ref 6.5–8.1)

## 2018-09-19 LAB — CBC WITH DIFFERENTIAL/PLATELET
Abs Immature Granulocytes: 0.03 10*3/uL (ref 0.00–0.07)
Basophils Absolute: 0.1 10*3/uL (ref 0.0–0.1)
Basophils Relative: 1 %
Eosinophils Absolute: 0.2 10*3/uL (ref 0.0–0.5)
Eosinophils Relative: 4 %
HCT: 36.9 % — ABNORMAL LOW (ref 39.0–52.0)
Hemoglobin: 12.8 g/dL — ABNORMAL LOW (ref 13.0–17.0)
Immature Granulocytes: 1 %
Lymphocytes Relative: 32 %
Lymphs Abs: 1.6 10*3/uL (ref 0.7–4.0)
MCH: 33.4 pg (ref 26.0–34.0)
MCHC: 34.7 g/dL (ref 30.0–36.0)
MCV: 96.3 fL (ref 80.0–100.0)
Monocytes Absolute: 1 10*3/uL (ref 0.1–1.0)
Monocytes Relative: 20 %
Neutro Abs: 2.1 10*3/uL (ref 1.7–7.7)
Neutrophils Relative %: 42 %
Platelets: 290 10*3/uL (ref 150–400)
RBC: 3.83 MIL/uL — ABNORMAL LOW (ref 4.22–5.81)
RDW: 16.7 % — ABNORMAL HIGH (ref 11.5–15.5)
WBC: 4.9 10*3/uL (ref 4.0–10.5)
nRBC: 0 % (ref 0.0–0.2)

## 2018-09-19 MED ORDER — FAMOTIDINE IN NACL 20-0.9 MG/50ML-% IV SOLN
20.0000 mg | Freq: Once | INTRAVENOUS | Status: AC
  Administered 2018-09-19: 15:00:00 20 mg via INTRAVENOUS
  Filled 2018-09-19: qty 50

## 2018-09-19 MED ORDER — ZOLPIDEM TARTRATE 10 MG PO TABS: 10.0000 mg | ORAL_TABLET | Freq: Every evening | ORAL | 0 refills | Status: DC | PRN

## 2018-09-19 MED ORDER — PROCHLORPERAZINE MALEATE 10 MG PO TABS
10.0000 mg | ORAL_TABLET | Freq: Once | ORAL | Status: AC
  Administered 2018-09-19: 15:00:00 10 mg via ORAL
  Filled 2018-09-19: qty 1

## 2018-09-19 MED ORDER — SODIUM CHLORIDE 0.9 % IV SOLN
1600.0000 mg | Freq: Once | INTRAVENOUS | Status: AC
  Administered 2018-09-19: 15:00:00 1600 mg via INTRAVENOUS
  Filled 2018-09-19: qty 26.3

## 2018-09-19 MED ORDER — SODIUM CHLORIDE 0.9% FLUSH
10.0000 mL | Freq: Once | INTRAVENOUS | Status: AC
  Administered 2018-09-19: 10 mL via INTRAVENOUS
  Filled 2018-09-19: qty 10

## 2018-09-19 MED ORDER — DIPHENHYDRAMINE HCL 50 MG/ML IJ SOLN
25.0000 mg | Freq: Once | INTRAMUSCULAR | Status: AC
  Administered 2018-09-19: 15:00:00 25 mg via INTRAVENOUS
  Filled 2018-09-19: qty 1

## 2018-09-19 MED ORDER — HEPARIN SOD (PORK) LOCK FLUSH 100 UNIT/ML IV SOLN
500.0000 [IU] | Freq: Once | INTRAVENOUS | Status: AC | PRN
  Administered 2018-09-19: 500 [IU]
  Filled 2018-09-19: qty 5

## 2018-09-19 MED ORDER — METHYLPREDNISOLONE SODIUM SUCC 125 MG IJ SOLR
40.0000 mg | Freq: Once | INTRAMUSCULAR | Status: AC
  Administered 2018-09-19: 40 mg via INTRAVENOUS
  Filled 2018-09-19: qty 2

## 2018-09-19 MED ORDER — SODIUM CHLORIDE 0.9 % IV SOLN
Freq: Once | INTRAVENOUS | Status: AC
  Administered 2018-09-19: 14:00:00 via INTRAVENOUS
  Filled 2018-09-19: qty 250

## 2018-09-19 NOTE — Progress Notes (Signed)
Patient stated that he had been doing well with no complaints. Patient is requesting a refill on his Ambien.

## 2018-09-20 LAB — CANCER ANTIGEN 19-9: CA 19-9: 159 U/mL — ABNORMAL HIGH (ref 0–35)

## 2018-09-24 NOTE — Progress Notes (Signed)
Benjamin Diaz  [phone] [phone]  ID: Benjamin Diaz. OB: 06-May-1960  MR#: [id number]  MGQ#:[id number]  Patient Care Team: Patient, [name] as PCP - General (General Practice) Benjamin Napoleon, MD as Consulting Physician (Oncology) Benjamin Irani, MD (Inactive) as Consulting Physician (Gastroenterology) Benjamin Silence, MD as Consulting Physician (Gastroenterology)  CHIEF COMPLAINT: Stage IV pancreatic cancer  INTERVAL HISTORY: Patient returns to clinic today for further evaluation and consideration of cycle 5, day 15 of single agent gemcitabine.  He continues to feel well and remains asymptomatic. He has chronic weakness and fatigue that is unchanged. He has a poor appetite, but no weight loss.  He has no neurologic complaints.  He denies any recent fevers or illnesses.  He denies any chest pain, shortness of breath, cough, or hemoptysis.  He has no abdominal pain.  He denies any nausea, vomiting, constipation, or diarrhea. He has no urinary complaints.  Patient offers no further specific complaints today.  REVIEW OF SYSTEMS:   Review of Systems  Constitutional: Positive for malaise/fatigue. Negative for fever and weight loss.  Respiratory: Negative.  Negative for cough and shortness of breath.   Cardiovascular: Negative.  Negative for chest pain and leg swelling.  Gastrointestinal: Negative.  Negative for abdominal pain, diarrhea, nausea and vomiting.  Genitourinary: Negative.  Negative for dysuria, flank pain, frequency and urgency.  Musculoskeletal: Positive for back pain. Negative for joint pain.  Skin: Negative.  Negative for itching and rash.  Neurological: Positive for weakness. Negative for tingling, tremors, sensory change and focal weakness.  Psychiatric/Behavioral: Negative.  The patient is not nervous/anxious and does not have insomnia.     As per HPI. Otherwise, a complete review of systems is negative.  PAST MEDICAL  HISTORY: Past Medical History:  Diagnosis Date   Arthritis    Asthma    Atrial fibrillation (HCC)    Closed left ankle fracture age 58   COPD (chronic obstructive pulmonary disease) ([place])    Dysrhythmia    Pancreatic cancer ([place]) 06/2016   Chemo tx's.    Pneumonia 2011   Right shoulder injury 09/27/2016   Sciatica    left leg pinched nerve numb at times about once a year    PAST SURGICAL HISTORY: Past Surgical History:  Procedure Laterality Date   BILIARY STENT PLACEMENT N/A 09/01/2016   Procedure: BILIARY STENT PLACEMENT;  Surgeon: Benjamin Silence, MD;  Location: [facility];  Service: Endoscopy;  Laterality: N/A;   DIAGNOSTIC LAPAROSCOPIC LIVER BIOPSY  09/16/2016   Procedure: DIAGNOSTIC LAPAROSCOPIC LIVER BIOPSY;  Surgeon: Benjamin Klein, MD;  Location: [facility];  Service: General;;   ERCP N/A 07/09/2016   Procedure: ENDOSCOPIC RETROGRADE CHOLANGIOPANCREATOGRAPHY (ERCP);  Surgeon: Benjamin Irani, MD;  Location: [facility];  Service: Endoscopy;  Laterality: N/A;   ERCP N/A 09/01/2016   Procedure: ENDOSCOPIC RETROGRADE CHOLANGIOPANCREATOGRAPHY (ERCP);  Surgeon: Benjamin Silence, MD;  Location: [facility];  Service: Endoscopy;  Laterality: N/A;   ERCP N/A 06/28/2017   Procedure: ENDOSCOPIC RETROGRADE CHOLANGIOPANCREATOGRAPHY (ERCP);  Surgeon: Benjamin Lame, MD;  Location: [facility];  Service: Endoscopy;  Laterality: N/A;   ESOPHAGOGASTRODUODENOSCOPY (EGD) WITH PROPOFOL N/A 09/01/2016   Procedure: ESOPHAGOGASTRODUODENOSCOPY (EGD) WITH PROPOFOL;  Surgeon: Benjamin Juniper, MD;  Location: [facility];  Service: Gastroenterology;  Laterality: N/A;   EUS N/A 09/01/2016   Procedure: ESOPHAGEAL ENDOSCOPIC ULTRASOUND (EUS) RADIAL;  Surgeon: Benjamin Silence, MD;  Location: [facility];  Service: Endoscopy;  Laterality: N/A;   FINE NEEDLE ASPIRATION N/A 09/01/2016   Procedure: FINE  NEEDLE ASPIRATION (FNA) RADIAL;  Surgeon: Benjamin Silence, MD;  Location: [facility];  Service: Endoscopy;   Laterality: N/A;   FOREIGN BODY REMOVAL N/A 09/01/2016   Procedure: FOREIGN BODY REMOVAL;  Surgeon: Benjamin Juniper, MD;  Location: [facility];  Service: Gastroenterology;  Laterality: N/A;   KNEE SURGERY Right 2002   arthroscopy   PORTA CATH INSERTION N/A 09/20/2017   Procedure: PORTA CATH INSERTION;  Surgeon: Benjamin Cabal, MD;  Location: [facility];  Service: Cardiovascular;  Laterality: N/A;   SHOULDER SURGERY Right 2002    FAMILY HISTORY: Family History  Problem Relation Age of Onset   Heart disease Father     ADVANCED DIRECTIVES (Y/N):  N  HEALTH MAINTENANCE: Social History   Tobacco Use   Smoking status: Current Every Day Smoker    Packs/day: 1.00    Years: 40.00    Pack years: 40.00    Types: Cigarettes   Smokeless tobacco: Never Used  Substance Use Topics   Alcohol use: Yes    Alcohol/week: 7.0 standard drinks    Types: 7 Cans of beer per week    Comment: last drink 5 weeeks ago  april 2018   Drug use: No     Colonoscopy:  PAP:  Bone density:  Lipid panel:  Allergies  Allergen Reactions   No Known Allergies     Current Outpatient Medications  Medication Sig Dispense Refill   albuterol (VENTOLIN HFA) 108 (90 Base) MCG/ACT inhaler Inhale 1-2 puffs into the lungs every 6 (six) hours as needed for wheezing or shortness of breath. 1 Inhaler 6   Ipratropium-Albuterol (COMBIVENT RESPIMAT) 20-100 MCG/ACT AERS respimat Inhale 1 puff into the lungs 4 (four) times daily. 1 Inhaler 6   megestrol (MEGACE) 40 MG tablet Take 1 tablet (40 mg total) by mouth daily. 30 tablet 2   Oxycodone HCl 10 MG TABS Take 1 tablet (10 mg total) by mouth 2 (two) times daily as needed. 60 tablet 0   prochlorperazine (COMPAZINE) 10 MG tablet Take 1 tablet (10 mg total) by mouth every 6 (six) hours as needed for nausea or vomiting. 60 tablet 0   traMADol (ULTRAM) 50 MG tablet Take 1 tablet (50 mg total) by mouth every 6 (six) hours as needed. 30 tablet 0    zolpidem (AMBIEN) 10 MG tablet Take 1 tablet (10 mg total) by mouth at bedtime as needed for up to 30 days for sleep. 30 tablet 0   No current facility-administered medications for this visit.    OBJECTIVE: Vitals:   09/26/18 0937  BP: 132/90  Pulse: 74  Resp: 18     Body mass index is 15.46 kg/m.    ECOG FS:0 - Asymptomatic  General: Thin, no acute distress. Eyes: Pink conjunctiva, anicteric sclera. HEENT: Normocephalic, moist mucous membranes. Lungs: Clear to auscultation bilaterally. Heart: Regular rate and rhythm. No rubs, murmurs, or gallops. Abdomen: Soft, nontender, nondistended. No organomegaly noted, normoactive bowel sounds. Musculoskeletal: No edema, cyanosis, or clubbing. Neuro: Alert, answering all questions appropriately. Cranial nerves grossly intact. Skin: No rashes or petechiae noted. Psych: Normal affect.  LAB RESULTS:  Lab Results  Component Value Date   NA 135 09/26/2018   K 4.0 09/26/2018   CL 101 09/26/2018   CO2 25 09/26/2018   GLUCOSE 99 09/26/2018   BUN 8 09/26/2018   CREATININE 0.33 (L) 09/26/2018   CALCIUM 8.9 09/26/2018   PROT 6.7 09/26/2018   ALBUMIN 3.6 09/26/2018   AST 97 (H) 09/26/2018  ALT 130 (H) 09/26/2018   ALKPHOS 626 (H) 09/26/2018   BILITOT 0.6 09/26/2018   GFRNONAA >60 09/26/2018   GFRAA >60 09/26/2018    Lab Results  Component Value Date   WBC 5.9 09/26/2018   NEUTROABS 3.8 09/26/2018   HGB 12.2 (L) 09/26/2018   HCT 35.7 (L) 09/26/2018   MCV 96.7 09/26/2018   PLT 130 (L) 09/26/2018      STUDIES: Ct Abdomen Pelvis W Contrast  Result Date: 09/11/2018 CLINICAL DATA:  Stage IV pancreatic cancer with ongoing chemotherapy. Restaging. EXAM: CT ABDOMEN AND PELVIS WITH CONTRAST TECHNIQUE: Multidetector CT imaging of the abdomen and pelvis was performed using the standard protocol following bolus administration of intravenous contrast. CONTRAST:  16mL OMNIPAQUE IOHEXOL 300 MG/ML  SOLN COMPARISON:  05/05/2018 PET-CT.  04/03/2018 CT chest, abdomen and pelvis. FINDINGS: Lower chest: Stable solid 3 mm basilar left lower lobe pulmonary nodule (series 3/image 8). No acute abnormality at the lung bases. Hepatobiliary: Normal liver size. No discrete liver masses. Stable gallbladder without definite wall thickening or pericholecystic fluid or discrete nodularity. No radiopaque cholelithiasis. Stable mild diffuse intrahepatic biliary ductal dilatation with pneumobilia. Stable lower common bile duct stent position. CBD diameter 13 mm, stable. Pancreas: Hypoenhancing infiltrative posterior pancreatic head mass measures 4.2 x 2.4 cm (series 2/image 29), previously 4.2 x 2.6 cm on 04/03/2018 CT using similar measurement technique, not appreciably changed. Stable mild diffuse pancreatic duct dilation. Hypodense pancreatic tail 1.3 cm lesion (series 9/image 13), previously 1.3 cm, stable. No new pancreatic lesions. No peripancreatic fluid collections. Spleen: Normal size. No mass. Adrenals/Urinary Tract: Normal adrenals. Subcentimeter hypodense renal cortical lesion in the upper right kidney, not definitely seen on prior, too small to characterize. Otherwise normal kidneys, with no hydronephrosis. Normal bladder. Stomach/Bowel: Normal non-distended stomach. Borderline mildly dilated small bowel loops throughout the abdomen up to 3.0 cm diameter. No focal small bowel caliber transition. No small bowel wall thickening or pneumatosis. Oral contrast transits to the pelvic small bowel. Normal appendix. Mild sigmoid diverticulosis, with no definite large bowel wall thickening or significant pericolonic fat stranding. Moderate colonic stool volume. Vascular/Lymphatic: Atherosclerotic nonaneurysmal abdominal aorta. Similar narrowing of the main portal vein, which remains patent. Patent splenic, renal and hepatic veins. Stable encasement of the proximal SMA by soft tissue. No pathologically enlarged lymph nodes in the abdomen or pelvis. Reproductive:  Top-normal size prostate. Other: No pneumoperitoneum, ascites or focal fluid collection. Musculoskeletal: No aggressive appearing focal osseous lesions. Mild lower lumbar spondylosis. IMPRESSION: 1. Posterior pancreatic head neoplasm has not appreciably changed since 04/03/2018 CT. Stable tumor involvement of the main portal vein and SMA. 2. No findings of metastatic disease in the abdomen or pelvis. 3. Stable bile ducts and CBD stent with pneumobilia indicating stent patency. 4. Nonspecific mildly dilated small bowel loops without discrete small bowel caliber transition or small bowel wall thickening, favor a mild adynamic ileus. 5.  Aortic Atherosclerosis (ICD10-I70.0). Electronically Signed   By: Ilona Sorrel M.D.   On: 09/11/2018 16:07    ASSESSMENT: Stage IV pancreatic cancer  PLAN:   1. Stage IV pancreatic cancer: Imaging, pathology, and Op note reviewed independently confirming stage IV disease with multiple peritoneal implants.  CT scan results from September 11, 2018 reviewed independently and reported as above with persistent disease in patient's pancreas, but no other findings suggestive of metastatic disease in the abdomen or pelvis.  His CA 19-9 continues to trend down and is now 159.  Patient wishes to continue treatment, but expressed understanding that  his treatment options are limited.  Patient previously stated he has no interest in hospice and if chemotherapy were not an option he would like to pursue surgery or ablation.  We discussed that this is likely not possible.  Proceed with cycle 5, day 15 of gemcitabine only today.  Return to clinic in 2 weeks for further evaluation and consideration of cycle 6, day 1. 2.  Elevated liver enzymes: AST and ALT remain chronically elevated and relatively unchanged. Gemcitabine does not need to be dose reduced and liver dysfunction.  Patient has not been evaluated by GI secondary to transportation issues.  Proceed with treatment as above. 3. Hyponatremia:  Resolved. 4. Peripheral edema: Continue elevation nightly.  Lower extremity ultrasound did not reveal DVT.   5.  Elevated bilirubin: Resolved. 6.  Pain: Chronic and unchanged.  Continue oxycodone as needed. 7.  Poor appetite: Patient has Megace prescribed, but is unclear his compliance.   8.  Shortness of breath: Chronic and unchanged.  Patient does not complain of this today.  Continue inhalers as prescribed. 9.  Shoulder pain/tremor: Patient does not complain of this today.  Patient was previously given a referral to orthopedics as he is received injections in the past that improved his symptoms. 10.  Insomnia: Improved with Ambien.  Continue to use as needed. 11.  Rash: Resolved.  Secondary gemcitabine.  Improved with Medrol Dosepak.  Continue premedications as ordered for chemotherapy.  12.  Thrombocytopenia: Mild.  Proceed with treatment as above.  Patient expressed understanding and was in agreement with this plan. He also understands that He can call clinic at any time with any questions, concerns, or complaints.   Cancer Staging Pancreatic cancer [facility]) Staging form: Exocrine Pancreas, AJCC 8th Edition - Clinical stage from 09/25/2016: Stage IV (cTX, cNX, pM1) - Signed by Lloyd Huger, MD on 09/25/2016   Lloyd Huger, MD   09/27/2018 6:39 AM

## 2018-09-25 ENCOUNTER — Other Ambulatory Visit: Payer: Self-pay

## 2018-09-26 ENCOUNTER — Encounter: Payer: Self-pay | Admitting: Oncology

## 2018-09-26 ENCOUNTER — Other Ambulatory Visit: Payer: Self-pay

## 2018-09-26 ENCOUNTER — Inpatient Hospital Stay (HOSPITAL_BASED_OUTPATIENT_CLINIC_OR_DEPARTMENT_OTHER): Payer: Medicaid Other | Admitting: Oncology

## 2018-09-26 ENCOUNTER — Inpatient Hospital Stay: Payer: Medicaid Other

## 2018-09-26 VITALS — Temp 97.6°F

## 2018-09-26 VITALS — BP 132/90 | HR 74 | Resp 18 | Wt 114.0 lb

## 2018-09-26 DIAGNOSIS — C786 Secondary malignant neoplasm of retroperitoneum and peritoneum: Secondary | ICD-10-CM

## 2018-09-26 DIAGNOSIS — C259 Malignant neoplasm of pancreas, unspecified: Secondary | ICD-10-CM

## 2018-09-26 DIAGNOSIS — F17210 Nicotine dependence, cigarettes, uncomplicated: Secondary | ICD-10-CM

## 2018-09-26 DIAGNOSIS — D696 Thrombocytopenia, unspecified: Secondary | ICD-10-CM

## 2018-09-26 DIAGNOSIS — R0602 Shortness of breath: Secondary | ICD-10-CM

## 2018-09-26 DIAGNOSIS — R748 Abnormal levels of other serum enzymes: Secondary | ICD-10-CM | POA: Diagnosis not present

## 2018-09-26 DIAGNOSIS — G4700 Insomnia, unspecified: Secondary | ICD-10-CM

## 2018-09-26 DIAGNOSIS — Z95828 Presence of other vascular implants and grafts: Secondary | ICD-10-CM

## 2018-09-26 DIAGNOSIS — R630 Anorexia: Secondary | ICD-10-CM

## 2018-09-26 DIAGNOSIS — Z5111 Encounter for antineoplastic chemotherapy: Secondary | ICD-10-CM | POA: Diagnosis not present

## 2018-09-26 DIAGNOSIS — G8929 Other chronic pain: Secondary | ICD-10-CM

## 2018-09-26 DIAGNOSIS — R600 Localized edema: Secondary | ICD-10-CM | POA: Diagnosis not present

## 2018-09-26 LAB — CBC WITH DIFFERENTIAL/PLATELET
Abs Immature Granulocytes: 0.05 10*3/uL (ref 0.00–0.07)
Basophils Absolute: 0.1 10*3/uL (ref 0.0–0.1)
Basophils Relative: 1 %
Eosinophils Absolute: 0.1 10*3/uL (ref 0.0–0.5)
Eosinophils Relative: 2 %
HCT: 35.7 % — ABNORMAL LOW (ref 39.0–52.0)
Hemoglobin: 12.2 g/dL — ABNORMAL LOW (ref 13.0–17.0)
Immature Granulocytes: 1 %
Lymphocytes Relative: 23 %
Lymphs Abs: 1.4 10*3/uL (ref 0.7–4.0)
MCH: 33.1 pg (ref 26.0–34.0)
MCHC: 34.2 g/dL (ref 30.0–36.0)
MCV: 96.7 fL (ref 80.0–100.0)
Monocytes Absolute: 0.6 10*3/uL (ref 0.1–1.0)
Monocytes Relative: 10 %
Neutro Abs: 3.8 10*3/uL (ref 1.7–7.7)
Neutrophils Relative %: 63 %
Platelets: 130 10*3/uL — ABNORMAL LOW (ref 150–400)
RBC: 3.69 MIL/uL — ABNORMAL LOW (ref 4.22–5.81)
RDW: 17.9 % — ABNORMAL HIGH (ref 11.5–15.5)
WBC: 5.9 10*3/uL (ref 4.0–10.5)
nRBC: 0 % (ref 0.0–0.2)

## 2018-09-26 LAB — COMPREHENSIVE METABOLIC PANEL
ALT: 130 U/L — ABNORMAL HIGH (ref 0–44)
AST: 97 U/L — ABNORMAL HIGH (ref 15–41)
Albumin: 3.6 g/dL (ref 3.5–5.0)
Alkaline Phosphatase: 626 U/L — ABNORMAL HIGH (ref 38–126)
Anion gap: 9 (ref 5–15)
BUN: 8 mg/dL (ref 6–20)
CO2: 25 mmol/L (ref 22–32)
Calcium: 8.9 mg/dL (ref 8.9–10.3)
Chloride: 101 mmol/L (ref 98–111)
Creatinine, Ser: 0.33 mg/dL — ABNORMAL LOW (ref 0.61–1.24)
GFR calc Af Amer: >60 mL/min (ref >60)
GFR calc non Af Amer: >60 mL/min (ref >60)
Glucose, Bld: 99 mg/dL (ref 70–99)
Potassium: 4 mmol/L (ref 3.5–5.1)
Sodium: 135 mmol/L (ref 135–145)
Total Bilirubin: 0.6 mg/dL (ref 0.3–1.2)
Total Protein: 6.7 g/dL (ref 6.5–8.1)

## 2018-09-26 MED ORDER — SODIUM CHLORIDE 0.9 % IV SOLN
Freq: Once | INTRAVENOUS | Status: AC
  Administered 2018-09-26: 10:00:00 via INTRAVENOUS
  Filled 2018-09-26: qty 250

## 2018-09-26 MED ORDER — PROCHLORPERAZINE MALEATE 10 MG PO TABS
10.0000 mg | ORAL_TABLET | Freq: Once | ORAL | Status: AC
  Administered 2018-09-26: 10 mg via ORAL
  Filled 2018-09-26: qty 1

## 2018-09-26 MED ORDER — METHYLPREDNISOLONE SODIUM SUCC 125 MG IJ SOLR
40.0000 mg | Freq: Once | INTRAMUSCULAR | Status: AC
  Administered 2018-09-26: 40 mg via INTRAVENOUS
  Filled 2018-09-26: qty 2

## 2018-09-26 MED ORDER — FAMOTIDINE IN NACL 20-0.9 MG/50ML-% IV SOLN
20.0000 mg | Freq: Once | INTRAVENOUS | Status: AC
  Administered 2018-09-26: 20 mg via INTRAVENOUS
  Filled 2018-09-26: qty 50

## 2018-09-26 MED ORDER — HEPARIN SOD (PORK) LOCK FLUSH 100 UNIT/ML IV SOLN
500.0000 [IU] | Freq: Once | INTRAVENOUS | Status: AC | PRN
  Administered 2018-09-26: 500 [IU]
  Filled 2018-09-26: qty 5

## 2018-09-26 MED ORDER — DIPHENHYDRAMINE HCL 50 MG/ML IJ SOLN
25.0000 mg | Freq: Once | INTRAMUSCULAR | Status: AC
  Administered 2018-09-26: 25 mg via INTRAVENOUS
  Filled 2018-09-26: qty 1

## 2018-09-26 MED ORDER — SODIUM CHLORIDE 0.9% FLUSH
10.0000 mL | Freq: Once | INTRAVENOUS | Status: AC
  Administered 2018-09-26: 10 mL via INTRAVENOUS
  Filled 2018-09-26: qty 10

## 2018-09-26 MED ORDER — SODIUM CHLORIDE 0.9 % IV SOLN
1600.0000 mg | Freq: Once | INTRAVENOUS | Status: AC
  Administered 2018-09-26: 1600 mg via INTRAVENOUS
  Filled 2018-09-26: qty 26.3

## 2018-09-26 NOTE — Progress Notes (Signed)
Patient denies any concerns today.  

## 2018-10-08 NOTE — Progress Notes (Signed)
[place]  Telephone:([phone] [phone]  ID: Benjamin Diaz. OB: Feb 15, 1961  MR#: [id number]  UYQ#:[id number]  Patient Care Team: Patient, [name] as PCP - General (General Practice) [name], MD as Consulting Physician (Oncology) [name], MD (Inactive) as Consulting Physician (Gastroenterology) [name], MD as Consulting Physician (Gastroenterology)  CHIEF COMPLAINT: Stage IV pancreatic cancer  INTERVAL HISTORY: Patient returns to clinic today for further evaluation and consideration of cycle 6, day 1 of single agent gemcitabine.  He continues to feel well and remains asymptomatic.  He continues to have chronic weakness and fatigue.  He has a poor appetite, but no weight loss.  He has no neurologic complaints.  He denies any recent fevers or illnesses.  He denies any chest pain, shortness of breath, cough, or hemoptysis.  He has no abdominal pain, but admits to occasional back pain.  He denies any nausea, vomiting, constipation, or diarrhea. He has no urinary complaints.  Patient offers no further specific complaints today.  REVIEW OF SYSTEMS:   Review of Systems  Constitutional: Positive for malaise/fatigue. Negative for fever and weight loss.  Respiratory: Negative.  Negative for cough and shortness of breath.   Cardiovascular: Negative.  Negative for chest pain and leg swelling.  Gastrointestinal: Negative.  Negative for abdominal pain, diarrhea, nausea and vomiting.  Genitourinary: Negative.  Negative for dysuria, flank pain, frequency and urgency.  Musculoskeletal: Positive for back pain. Negative for joint pain.  Skin: Negative.  Negative for itching and rash.  Neurological: Positive for weakness. Negative for tingling, tremors, sensory change and focal weakness.  Psychiatric/Behavioral: Negative.  The patient is not nervous/anxious and does not have insomnia.     As per HPI. Otherwise, a complete review of systems is  negative.  PAST MEDICAL HISTORY: Past Medical History:  Diagnosis Date   Arthritis    Asthma    Atrial fibrillation (HCC)    Closed left ankle fracture age 15   COPD (chronic obstructive pulmonary disease) ([place])    Dysrhythmia    Pancreatic cancer ([place]) 06/2016   Chemo tx's.    Pneumonia 2011   Right shoulder injury 09/27/2016   Sciatica    left leg pinched nerve numb at times about once a year    PAST SURGICAL HISTORY: Past Surgical History:  Procedure Laterality Date   BILIARY STENT PLACEMENT N/A 09/01/2016   Procedure: BILIARY STENT PLACEMENT;  Surgeon: [name], MD;  Location: [facility];  Service: Endoscopy;  Laterality: N/A;   DIAGNOSTIC LAPAROSCOPIC LIVER BIOPSY  09/16/2016   Procedure: DIAGNOSTIC LAPAROSCOPIC LIVER BIOPSY;  Surgeon: [name], MD;  Location: [place];  Service: General;;   ERCP N/A 07/09/2016   Procedure: ENDOSCOPIC RETROGRADE CHOLANGIOPANCREATOGRAPHY (ERCP);  Surgeon: [name], MD;  Location: [facility];  Service: Endoscopy;  Laterality: N/A;   ERCP N/A 09/01/2016   Procedure: ENDOSCOPIC RETROGRADE CHOLANGIOPANCREATOGRAPHY (ERCP);  Surgeon: [name], MD;  Location: [facility];  Service: Endoscopy;  Laterality: N/A;   ERCP N/A 06/28/2017   Procedure: ENDOSCOPIC RETROGRADE CHOLANGIOPANCREATOGRAPHY (ERCP);  Surgeon: [name], MD;  Location[facility];  Service: Endoscopy;  Laterality: N/A;   ESOPHAGOGASTRODUODENOSCOPY (EGD) WITH PROPOFOL N/A 09/01/2016   Procedure: ESOPHAGOGASTRODUODENOSCOPY (EGD) WITH PROPOFOL;  Surgeon: [name], MD;  [facility];  Service: Gastroenterology;  Laterality: N/A;   EUS N/A 09/01/2016   Procedure: ESOPHAGEAL ENDOSCOPIC ULTRASOUND (EUS) RADIAL;  Surgeon: [name], MD;  [facility];  Service: Endoscopy;  Laterality: N/A;   FINE NEEDLE  ASPIRATION N/A 09/01/2016   Procedure: FINE NEEDLE ASPIRATION (FNA) RADIAL;  Surgeon: [name], MD;  [facility];  Service: Endoscopy;  Laterality: N/A;   FOREIGN BODY REMOVAL N/A 09/01/2016   Procedure: FOREIGN BODY REMOVAL;  Surgeon: [name], MD;  [facility];  Service: Gastroenterology;  Laterality: N/A;   KNEE SURGERY Right 2002   arthroscopy   PORTA CATH INSERTION N/A 09/20/2017   Procedure: PORTA CATH INSERTION;  Surgeon: [name], MD;  Location: [place] CV [facility];  Service: Cardiovascular;  Laterality: N/A;   SHOULDER SURGERY Right 2002    FAMILY HISTORY: Family History  Problem Relation Age of Onset   Heart disease Father     ADVANCED DIRECTIVES (Y/N):  N  HEALTH MAINTENANCE: Social History   Tobacco Use   Smoking status: Current Every Day Smoker    Packs/day: 1.00    Years: 40.00    Pack years: 40.00    Types: Cigarettes   Smokeless tobacco: Never Used  Substance Use Topics   Alcohol use: Yes    Alcohol/week: 7.0 standard drinks    Types: 7 Cans of beer per week    Comment: last drink 5 weeeks ago  april 2018   Drug use: No     Colonoscopy:  PAP:  Bone density:  Lipid panel:  Allergies  Allergen Reactions   No Known Allergies     Current Outpatient Medications  Medication Sig Dispense Refill   albuterol (VENTOLIN HFA) 108 (90 Base) MCG/ACT inhaler Inhale 1-2 puffs into the lungs every 6 (six) hours as needed for wheezing or shortness of breath. 1 Inhaler 6   Ipratropium-Albuterol (COMBIVENT RESPIMAT) 20-100 MCG/ACT AERS respimat Inhale 1 puff into the lungs 4 (four) times daily. 1 Inhaler 6   megestrol (MEGACE) 40 MG tablet Take 1 tablet (40 mg total) by mouth daily. 30 tablet 2   Oxycodone HCl 10 MG TABS Take 1 tablet (10 mg total) by mouth 2 (two) times daily as needed. 60 tablet 0   prochlorperazine (COMPAZINE) 10 MG tablet Take 1 tablet (10 mg total) by mouth every 6 (six) hours as needed for nausea or vomiting. 60 tablet 0   traMADol (ULTRAM) 50 MG tablet Take 1 tablet (50 mg total) by mouth every 6 (six) hours  as needed. 30 tablet 0   zolpidem (AMBIEN) 10 MG tablet Take 1 tablet (10 mg total) by mouth at bedtime as needed for up to 30 days for sleep. 30 tablet 0   No current facility-administered medications for this visit.    OBJECTIVE: Vitals:   10/10/18 0932  BP: 124/80  Pulse: 86  Temp: 98.7 F (37.1 C)     Body mass index is 15.87 kg/m.    ECOG FS:0 - Asymptomatic  General: Thin, no acute distress. Eyes: Pink conjunctiva, anicteric sclera. HEENT: Normocephalic, moist mucous membranes. Lungs: Clear to auscultation bilaterally. Heart: Regular rate and rhythm. No rubs, murmurs, or gallops. Abdomen: Soft, nontender, nondistended. No organomegaly noted, normoactive bowel sounds. Musculoskeletal: No edema, cyanosis, or clubbing. Neuro: Alert, answering all questions appropriately. Cranial nerves grossly intact. Skin: No rashes or petechiae noted. Psych: Normal affect.  LAB RESULTS:  Lab Results  Component Value Date   NA 131 (L) 10/10/2018   K 3.7 10/10/2018   CL 97 (L) 10/10/2018   CO2 24 10/10/2018   GLUCOSE 106 (H) 10/10/2018   BUN 5 (L) 10/10/2018   CREATININE 0.34 (L) 10/10/2018   CALCIUM 8.6 (L) 10/10/2018   PROT  6.5 10/10/2018   ALBUMIN 3.2 (L) 10/10/2018   AST 37 10/10/2018   ALT 43 10/10/2018   ALKPHOS 624 (H) 10/10/2018   BILITOT 1.0 10/10/2018   GFRNONAA >60 10/10/2018   GFRAA >60 10/10/2018    Lab Results  Component Value Date   WBC 10.1 10/10/2018   NEUTROABS 6.7 10/10/2018   HGB 11.8 (L) 10/10/2018   HCT 34.8 (L) 10/10/2018   MCV 96.7 10/10/2018   PLT 348 10/10/2018      STUDIES: Ct Abdomen Pelvis W Contrast  Result Date: 09/11/2018 CLINICAL DATA:  Stage IV pancreatic cancer with ongoing chemotherapy. Restaging. EXAM: CT ABDOMEN AND PELVIS WITH CONTRAST TECHNIQUE: Multidetector CT imaging of the abdomen and pelvis was performed using the standard protocol following bolus administration of intravenous contrast. CONTRAST:  42mL OMNIPAQUE IOHEXOL  300 MG/ML  SOLN COMPARISON:  05/05/2018 PET-CT. 04/03/2018 CT chest, abdomen and pelvis. FINDINGS: Lower chest: Stable solid 3 mm basilar left lower lobe pulmonary nodule (series 3/image 8). No acute abnormality at the lung bases. Hepatobiliary: Normal liver size. No discrete liver masses. Stable gallbladder without definite wall thickening or pericholecystic fluid or discrete nodularity. No radiopaque cholelithiasis. Stable mild diffuse intrahepatic biliary ductal dilatation with pneumobilia. Stable lower common bile duct stent position. CBD diameter 13 mm, stable. Pancreas: Hypoenhancing infiltrative posterior pancreatic head mass measures 4.2 x 2.4 cm (series 2/image 29), previously 4.2 x 2.6 cm on 04/03/2018 CT using similar measurement technique, not appreciably changed. Stable mild diffuse pancreatic duct dilation. Hypodense pancreatic tail 1.3 cm lesion (series 9/image 13), previously 1.3 cm, stable. No new pancreatic lesions. No peripancreatic fluid collections. Spleen: Normal size. No mass. Adrenals/Urinary Tract: Normal adrenals. Subcentimeter hypodense renal cortical lesion in the upper right kidney, not definitely seen on prior, too small to characterize. Otherwise normal kidneys, with no hydronephrosis. Normal bladder. Stomach/Bowel: Normal non-distended stomach. Borderline mildly dilated small bowel loops throughout the abdomen up to 3.0 cm diameter. No focal small bowel caliber transition. No small bowel wall thickening or pneumatosis. Oral contrast transits to the pelvic small bowel. Normal appendix. Mild sigmoid diverticulosis, with no definite large bowel wall thickening or significant pericolonic fat stranding. Moderate colonic stool volume. Vascular/Lymphatic: Atherosclerotic nonaneurysmal abdominal aorta. Similar narrowing of the main portal vein, which remains patent. Patent splenic, renal and hepatic veins. Stable encasement of the proximal SMA by soft tissue. No pathologically enlarged lymph  nodes in the abdomen or pelvis. Reproductive: Top-normal size prostate. Other: No pneumoperitoneum, ascites or focal fluid collection. Musculoskeletal: No aggressive appearing focal osseous lesions. Mild lower lumbar spondylosis. IMPRESSION: 1. Posterior pancreatic head neoplasm has not appreciably changed since 04/03/2018 CT. Stable tumor involvement of the main portal vein and SMA. 2. No findings of metastatic disease in the abdomen or pelvis. 3. Stable bile ducts and CBD stent with pneumobilia indicating stent patency. 4. Nonspecific mildly dilated small bowel loops without discrete small bowel caliber transition or small bowel wall thickening, favor a mild adynamic ileus. 5.  Aortic Atherosclerosis (ICD10-I70.0). Electronically Signed   By: Ilona Sorrel M.D.   On: 09/11/2018 16:07    ASSESSMENT: Stage IV pancreatic cancer  PLAN:   1. Stage IV pancreatic cancer: Imaging, pathology, and Op note reviewed independently confirming stage IV disease with multiple peritoneal implants.  CT scan results from September 11, 2018 reviewed independently and reported as above with persistent disease in patient's pancreas, but no other findings suggestive of metastatic disease in the abdomen or pelvis.  His CA 19-9 continues to trend down and  is now 132.  Patient wishes to continue treatment, but expressed understanding that his treatment options are limited.  Patient previously stated he has no interest in hospice and if chemotherapy were not an option he would like to pursue surgery or ablation.  We discussed that this is likely not possible.  Proceed with cycle 6, day 1 of gemcitabine only today.  Return to clinic in 1 week for further evaluation and consideration of cycle 6, day 8. 2.  Elevated liver enzymes: Resolved.  Gemcitabine does not need to be dose reduced and liver dysfunction.  Patient has not been evaluated by GI secondary to transportation issues.  Proceed with treatment as above. 3. Hyponatremia: Mild,  patient sodium levels 131 today. 4. Peripheral edema: Continue elevation nightly.  Lower extremity ultrasound did not reveal DVT.   5.  Elevated bilirubin: Resolved. 6.  Pain: Chronic and unchanged.  Continue oxycodone as needed. 7.  Poor appetite: Patient has Megace prescribed, but is unclear his compliance.   8.  Shortness of breath: Chronic and unchanged.  Patient does not complain of this today.  Continue inhalers as prescribed. 9.  Shoulder pain/tremor: Patient does not complain of this today.  Patient was previously given a referral to orthopedics as he is received injections in the past that improved his symptoms. 10.  Insomnia: Improved with Ambien.  Continue to use as needed. 11.  Rash: Resolved.  Secondary gemcitabine.  Improved with Medrol Dosepak.  Continue premedications as ordered for chemotherapy.  12.  Thrombocytopenia: Resolved.  Patient expressed understanding and was in agreement with this plan. He also understands that He can call clinic at any time with any questions, concerns, or complaints.   Cancer Staging Pancreatic cancer [facility]) Staging form: Exocrine Pancreas, AJCC 8th Edition - Clinical stage from 09/25/2016: Stage IV (cTX, cNX, pM1) - Signed by Lloyd Huger, MD on 09/25/2016   Lloyd Huger, MD   10/11/2018 1:23 PM

## 2018-10-10 ENCOUNTER — Encounter: Payer: Self-pay | Admitting: Oncology

## 2018-10-10 ENCOUNTER — Inpatient Hospital Stay: Payer: Medicaid Other

## 2018-10-10 ENCOUNTER — Inpatient Hospital Stay (HOSPITAL_BASED_OUTPATIENT_CLINIC_OR_DEPARTMENT_OTHER): Payer: Medicaid Other | Admitting: Oncology

## 2018-10-10 ENCOUNTER — Other Ambulatory Visit: Payer: Self-pay

## 2018-10-10 ENCOUNTER — Inpatient Hospital Stay: Payer: Medicaid Other | Attending: Oncology

## 2018-10-10 VITALS — BP 124/80 | HR 86 | Temp 98.7°F | Ht 72.0 in | Wt 117.0 lb

## 2018-10-10 DIAGNOSIS — I4891 Unspecified atrial fibrillation: Secondary | ICD-10-CM | POA: Diagnosis not present

## 2018-10-10 DIAGNOSIS — Z95828 Presence of other vascular implants and grafts: Secondary | ICD-10-CM

## 2018-10-10 DIAGNOSIS — C786 Secondary malignant neoplasm of retroperitoneum and peritoneum: Secondary | ICD-10-CM | POA: Insufficient documentation

## 2018-10-10 DIAGNOSIS — C259 Malignant neoplasm of pancreas, unspecified: Secondary | ICD-10-CM

## 2018-10-10 DIAGNOSIS — F17210 Nicotine dependence, cigarettes, uncomplicated: Secondary | ICD-10-CM | POA: Insufficient documentation

## 2018-10-10 DIAGNOSIS — Z5111 Encounter for antineoplastic chemotherapy: Secondary | ICD-10-CM | POA: Diagnosis present

## 2018-10-10 DIAGNOSIS — J449 Chronic obstructive pulmonary disease, unspecified: Secondary | ICD-10-CM | POA: Diagnosis not present

## 2018-10-10 LAB — COMPREHENSIVE METABOLIC PANEL
ALT: 43 U/L (ref 0–44)
AST: 37 U/L (ref 15–41)
Albumin: 3.2 g/dL — ABNORMAL LOW (ref 3.5–5.0)
Alkaline Phosphatase: 624 U/L — ABNORMAL HIGH (ref 38–126)
Anion gap: 10 (ref 5–15)
BUN: 5 mg/dL — ABNORMAL LOW (ref 6–20)
CO2: 24 mmol/L (ref 22–32)
Calcium: 8.6 mg/dL — ABNORMAL LOW (ref 8.9–10.3)
Chloride: 97 mmol/L — ABNORMAL LOW (ref 98–111)
Creatinine, Ser: 0.34 mg/dL — ABNORMAL LOW (ref 0.61–1.24)
GFR calc Af Amer: >60 mL/min (ref >60)
GFR calc non Af Amer: >60 mL/min (ref >60)
Glucose, Bld: 106 mg/dL — ABNORMAL HIGH (ref 70–99)
Potassium: 3.7 mmol/L (ref 3.5–5.1)
Sodium: 131 mmol/L — ABNORMAL LOW (ref 135–145)
Total Bilirubin: 1 mg/dL (ref 0.3–1.2)
Total Protein: 6.5 g/dL (ref 6.5–8.1)

## 2018-10-10 LAB — CBC WITH DIFFERENTIAL/PLATELET
Abs Immature Granulocytes: 0.05 10*3/uL (ref 0.00–0.07)
Basophils Absolute: 0.1 10*3/uL (ref 0.0–0.1)
Basophils Relative: 1 %
Eosinophils Absolute: 0.4 10*3/uL (ref 0.0–0.5)
Eosinophils Relative: 4 %
HCT: 34.8 % — ABNORMAL LOW (ref 39.0–52.0)
Hemoglobin: 11.8 g/dL — ABNORMAL LOW (ref 13.0–17.0)
Immature Granulocytes: 1 %
Lymphocytes Relative: 13 %
Lymphs Abs: 1.3 10*3/uL (ref 0.7–4.0)
MCH: 32.8 pg (ref 26.0–34.0)
MCHC: 33.9 g/dL (ref 30.0–36.0)
MCV: 96.7 fL (ref 80.0–100.0)
Monocytes Absolute: 1.6 10*3/uL — ABNORMAL HIGH (ref 0.1–1.0)
Monocytes Relative: 16 %
Neutro Abs: 6.7 10*3/uL (ref 1.7–7.7)
Neutrophils Relative %: 65 %
Platelets: 348 10*3/uL (ref 150–400)
RBC: 3.6 MIL/uL — ABNORMAL LOW (ref 4.22–5.81)
RDW: 18.8 % — ABNORMAL HIGH (ref 11.5–15.5)
WBC: 10.1 10*3/uL (ref 4.0–10.5)
nRBC: 0 % (ref 0.0–0.2)

## 2018-10-10 MED ORDER — HEPARIN SOD (PORK) LOCK FLUSH 100 UNIT/ML IV SOLN
500.0000 [IU] | Freq: Once | INTRAVENOUS | Status: AC | PRN
  Administered 2018-10-10: 500 [IU]
  Filled 2018-10-10: qty 5

## 2018-10-10 MED ORDER — METHYLPREDNISOLONE SODIUM SUCC 125 MG IJ SOLR
40.0000 mg | Freq: Once | INTRAMUSCULAR | Status: AC
  Administered 2018-10-10: 10:00:00 40 mg via INTRAVENOUS
  Filled 2018-10-10: qty 2

## 2018-10-10 MED ORDER — DIPHENHYDRAMINE HCL 50 MG/ML IJ SOLN
INTRAMUSCULAR | Status: AC
  Filled 2018-10-10: qty 1

## 2018-10-10 MED ORDER — SODIUM CHLORIDE 0.9 % IV SOLN
Freq: Once | INTRAVENOUS | Status: AC
  Administered 2018-10-10: 10:00:00 via INTRAVENOUS
  Filled 2018-10-10: qty 250

## 2018-10-10 MED ORDER — SODIUM CHLORIDE 0.9% FLUSH
10.0000 mL | Freq: Once | INTRAVENOUS | Status: AC
  Administered 2018-10-10: 09:00:00 10 mL via INTRAVENOUS
  Filled 2018-10-10: qty 10

## 2018-10-10 MED ORDER — DIPHENHYDRAMINE HCL 50 MG/ML IJ SOLN
25.0000 mg | Freq: Once | INTRAMUSCULAR | Status: AC
  Administered 2018-10-10: 25 mg via INTRAVENOUS
  Filled 2018-10-10: qty 1

## 2018-10-10 MED ORDER — SODIUM CHLORIDE 0.9 % IV SOLN
1600.0000 mg | Freq: Once | INTRAVENOUS | Status: AC
  Administered 2018-10-10: 11:00:00 1600 mg via INTRAVENOUS
  Filled 2018-10-10: qty 26.3

## 2018-10-10 MED ORDER — FAMOTIDINE IN NACL 20-0.9 MG/50ML-% IV SOLN
20.0000 mg | Freq: Once | INTRAVENOUS | Status: AC
  Administered 2018-10-10: 20 mg via INTRAVENOUS
  Filled 2018-10-10: qty 50

## 2018-10-10 MED ORDER — PROCHLORPERAZINE MALEATE 10 MG PO TABS
10.0000 mg | ORAL_TABLET | Freq: Once | ORAL | Status: AC
  Administered 2018-10-10: 10 mg via ORAL
  Filled 2018-10-10: qty 1

## 2018-10-10 NOTE — Progress Notes (Signed)
Patient stated that he had been doing well. Patient stated that his ankles continues to swell.

## 2018-10-11 LAB — CANCER ANTIGEN 19-9: CA 19-9: 132 U/mL — ABNORMAL HIGH (ref 0–35)

## 2018-10-14 NOTE — Progress Notes (Signed)
[place]  Telephone:([phone] [phone]  ID: Benjamin Diaz. OB: [birth year]  MR#: [id number]  YSA#:[id number]  Patient Care Team: Patient, [name] as PCP - General (General Practice) [name], MD as Consulting Physician (Oncology) [name], MD (Inactive) as Consulting Physician (Gastroenterology) [name], MD as Consulting Physician (Gastroenterology)  CHIEF COMPLAINT: Stage IV pancreatic cancer  INTERVAL HISTORY: Patient returns to clinic today for further evaluation and consideration of cycle 6, day 8 of single agent gemcitabine.  He continues to feel well and remains asymptomatic. He continues to have chronic weakness and fatigue.  He has a fair appetite.  He has no neurologic complaints.  He denies any recent fevers or illnesses.  He denies any chest pain, shortness of breath, cough, or hemoptysis.  He has no abdominal pain, but admits to occasional back pain.  He denies any nausea, vomiting, constipation, or diarrhea. He has no urinary complaints.  Patient offers no further specific complaints today.  REVIEW OF SYSTEMS:   Review of Systems  Constitutional: Positive for malaise/fatigue. Negative for fever and weight loss.  Respiratory: Negative.  Negative for cough and shortness of breath.   Cardiovascular: Negative.  Negative for chest pain and leg swelling.  Gastrointestinal: Negative.  Negative for abdominal pain, diarrhea, nausea and vomiting.  Genitourinary: Negative.  Negative for dysuria, flank pain, frequency and urgency.  Musculoskeletal: Positive for back pain. Negative for joint pain.  Skin: Negative.  Negative for itching and rash.  Neurological: Positive for weakness. Negative for tingling, tremors, sensory change and focal weakness.  Psychiatric/Behavioral: Negative.  The patient is not nervous/anxious and does not have insomnia.     As per HPI. Otherwise, a complete review of systems is negative.  PAST  MEDICAL HISTORY: Past Medical History:  Diagnosis Date  . Arthritis   . Asthma   . Atrial fibrillation ([place])   . Closed left ankle fracture age 51  . COPD (chronic obstructive pulmonary disease) ([place])   . Dysrhythmia   . Pancreatic cancer ([place]) 06/2016   Chemo tx's.   . Pneumonia 2011  . Right shoulder injury 09/27/2016  . Sciatica    left leg pinched nerve numb at times about once a year    PAST SURGICAL HISTORY: Past Surgical History:  Procedure Laterality Date  . BILIARY STENT PLACEMENT N/A 09/01/2016   Procedure: BILIARY STENT PLACEMENT;  Surgeon: [name], MD;  [facility];  Service: Endoscopy;  Laterality: N/A;  . DIAGNOSTIC LAPAROSCOPIC LIVER BIOPSY  09/16/2016   Procedure: DIAGNOSTIC LAPAROSCOPIC LIVER BIOPSY;  Surgeon: [name], MD;  Location: [place];  Service: General;;  . ERCP N/A 07/09/2016   Procedure: ENDOSCOPIC RETROGRADE CHOLANGIOPANCREATOGRAPHY (ERCP);  Surgeon: [name], MD;  Location: [facility];  Service: Endoscopy;  Laterality: N/A;  . ERCP N/A 09/01/2016   Procedure: ENDOSCOPIC RETROGRADE CHOLANGIOPANCREATOGRAPHY (ERCP);  Surgeon: [name], MD;  Location: [facility];  Service: Endoscopy;  Laterality: N/A;  . ERCP N/A 06/28/2017   Procedure: ENDOSCOPIC RETROGRADE CHOLANGIOPANCREATOGRAPHY (ERCP);  Surgeon: [name], MD;  Location: [facility]COPY;  Service: Endoscopy;  Laterality: N/A;  . ESOPHAGOGASTRODUODENOSCOPY (EGD) WITH PROPOFOL N/A 09/01/2016   Procedure: ESOPHAGOGASTRODUODENOSCOPY (EGD) WITH PROPOFOL;  Surgeon: [name], MD;  [facility];  Service: Gastroenterology;  Laterality: N/A;  . EUS N/A 09/01/2016   Procedure: ESOPHAGEAL ENDOSCOPIC ULTRASOUND (EUS) RADIAL;  Surgeon: [name], MD;  Location[facility];  Service: Endoscopy;  Laterality: N/A;  . FINE NEEDLE ASPIRATION N/A 09/01/2016  Procedure: FINE NEEDLE ASPIRATION (FNA) RADIAL;  Surgeon: [name], MD;  [facility];  Service:  Endoscopy;  Laterality: N/A;  . FOREIGN BODY REMOVAL N/A 09/01/2016   Procedure: FOREIGN BODY REMOVAL;  Surgeon: [name], MD;  Location: [facility];  Service: Gastroenterology;  Laterality: N/A;  . KNEE SURGERY Right 2002   arthroscopy  . PORTA CATH INSERTION N/A 09/20/2017   Procedure: PORTA CATH INSERTION;  Surgeon: [name], MD;  Location: [place] [facility];  Service: Cardiovascular;  Laterality: N/A;  . SHOULDER SURGERY Right 2002    FAMILY HISTORY: Family History  Problem Relation Age of Onset  . Heart disease Father     ADVANCED DIRECTIVES (Y/N):  N  HEALTH MAINTENANCE: Social History   Tobacco Use  . Smoking status: Current Every Day Smoker    Packs/day: 1.00    Years: 40.00    Pack years: 40.00    Types: Cigarettes  . Smokeless tobacco: Never Used  Substance Use Topics  . Alcohol use: Yes    Alcohol/week: 7.0 standard drinks    Types: 7 Cans of beer per week    Comment: last drink 5 weeeks ago  april 2018  . Drug use: No     Colonoscopy:  PAP:  Bone density:  Lipid panel:  Allergies  Allergen Reactions  . No Known Allergies     Current Outpatient Medications  Medication Sig Dispense Refill  . albuterol (VENTOLIN HFA) 108 (90 Base) MCG/ACT inhaler Inhale 1-2 puffs into the lungs every 6 (six) hours as needed for wheezing or shortness of breath. 1 Inhaler 6  . Ipratropium-Albuterol (COMBIVENT RESPIMAT) 20-100 MCG/ACT AERS respimat Inhale 1 puff into the lungs 4 (four) times daily. 1 Inhaler 6  . megestrol (MEGACE) 40 MG tablet Take 1 tablet (40 mg total) by mouth daily. 30 tablet 2  . Oxycodone HCl 10 MG TABS Take 1 tablet (10 mg total) by mouth 2 (two) times daily as needed. 60 tablet 0  . prochlorperazine (COMPAZINE) 10 MG tablet Take 1 tablet (10 mg total) by mouth every 6 (six) hours as needed for nausea or vomiting. 60 tablet 0  . traMADol (ULTRAM) 50 MG tablet Take 1 tablet (50 mg total) by mouth every 6 (six) hours as needed. 30 tablet  0  . zolpidem (AMBIEN) 10 MG tablet Take 1 tablet (10 mg total) by mouth at bedtime as needed for up to 30 days for sleep. 30 tablet 0   No current facility-administered medications for this visit.    OBJECTIVE: Vitals:   10/17/18 0858  BP: 123/86  Pulse: 83  Temp: 97.8 F (36.6 C)     Body mass index is 15.33 kg/m.    ECOG FS:0 - Asymptomatic  General: Thin, no acute distress. Eyes: Pink conjunctiva, anicteric sclera. HEENT: Normocephalic, moist mucous membranes, clear oropharnyx. Lungs: Clear to auscultation bilaterally. Heart: Regular rate and rhythm. No rubs, murmurs, or gallops. Abdomen: Soft, nontender, nondistended. No organomegaly noted, normoactive bowel sounds. Musculoskeletal: No edema, cyanosis, or clubbing. Neuro: Alert, answering all questions appropriately. Cranial nerves grossly intact. Skin: No rashes or petechiae noted. Psych: Normal affect.  LAB RESULTS:  Lab Results  Component Value Date   NA 135 10/17/2018   K 3.8 10/17/2018   CL 103 10/17/2018   CO2 23 10/17/2018   GLUCOSE 106 (H) 10/17/2018   BUN 5 (L) 10/17/2018   CREATININE 0.31 (L) 10/17/2018   CALCIUM 8.9 10/17/2018   PROT 6.8 10/17/2018   ALBUMIN 3.4 (  L) 10/17/2018   AST 90 (H) 10/17/2018   ALT 79 (H) 10/17/2018   ALKPHOS 760 (H) 10/17/2018   BILITOT 0.7 10/17/2018   GFRNONAA >60 10/17/2018   GFRAA >60 10/17/2018    Lab Results  Component Value Date   WBC 5.1 10/17/2018   NEUTROABS 2.4 10/17/2018   HGB 12.4 (L) 10/17/2018   HCT 36.3 (L) 10/17/2018   MCV 96.5 10/17/2018   PLT 286 10/17/2018      STUDIES: No results found.  ASSESSMENT: Stage IV pancreatic cancer  PLAN:   1. Stage IV pancreatic cancer: Imaging, pathology, and Op note reviewed independently confirming stage IV disease with multiple peritoneal implants.  CT scan results from September 11, 2018 reviewed independently with persistent disease in patient's pancreas, but no other findings suggestive of metastatic disease  in the abdomen or pelvis.  His CA 19-9 continues to trend down and is now 132. Patient wishes to continue treatment, but expressed understanding that his treatment options are limited.  Patient previously stated he has no interest in hospice and if chemotherapy were not an option he would like to pursue surgery or ablation.  We discussed that this is likely not possible.  Proceed with cycle 6, day 8 of gemcitabine only today.  Return to clinic in 1 week for further evaluation and consideration of cycle 6, day 15. 2.  Elevated liver enzymes: Mild.  Gemcitabine does not need to be dose reduced and liver dysfunction.  Patient has not been evaluated by GI secondary to transportation issues.  Proceed with treatment as above. 3. Hyponatremia: Patient's sodium levels are within normal limits today. 4. Peripheral edema: Continue elevation nightly.  Lower extremity ultrasound did not reveal DVT.   5.  Elevated bilirubin: Resolved. 6.  Pain: Chronic and unchanged.  Continue oxycodone as needed. 7.  Poor appetite: Patient has Megace prescribed, but is unclear his compliance.   8.  Shortness of breath: Chronic and unchanged.  Patient does not complain of this today.  Continue inhalers as prescribed. 9.  Shoulder pain/tremor: Patient does not complain of this today.  Patient was previously given a referral to orthopedics as he is received injections in the past that improved his symptoms. 10.  Insomnia: Improved with Ambien.  Continue to use as needed. 11.  Rash: Resolved.  Secondary gemcitabine.  Improved with Medrol Dosepak.  Continue premedications as ordered for chemotherapy.  12.  Thrombocytopenia: Resolved.  I spent a total of 30 minutes face-to-face with the patient of which greater than 50% of the visit was spent in counseling and coordination of care as detailed above.   Patient expressed understanding and was in agreement with this plan. He also understands that He can call clinic at any time with any  questions, concerns, or complaints.   Cancer Staging Pancreatic cancer [facility]) Staging form: Exocrine Pancreas, AJCC 8th Edition - Clinical stage from 09/25/2016: Stage IV (cTX, cNX, pM1) - Signed by Lloyd Huger, MD on 09/25/2016   Lloyd Huger, MD   10/18/2018 7:23 AM

## 2018-10-17 ENCOUNTER — Other Ambulatory Visit: Payer: Self-pay

## 2018-10-17 ENCOUNTER — Inpatient Hospital Stay: Payer: Medicaid Other

## 2018-10-17 ENCOUNTER — Inpatient Hospital Stay (HOSPITAL_BASED_OUTPATIENT_CLINIC_OR_DEPARTMENT_OTHER): Payer: Medicaid Other | Admitting: Oncology

## 2018-10-17 ENCOUNTER — Encounter: Payer: Self-pay | Admitting: Oncology

## 2018-10-17 VITALS — BP 123/86 | HR 83 | Temp 97.8°F | Ht 72.0 in | Wt 113.0 lb

## 2018-10-17 DIAGNOSIS — C259 Malignant neoplasm of pancreas, unspecified: Secondary | ICD-10-CM

## 2018-10-17 DIAGNOSIS — F17210 Nicotine dependence, cigarettes, uncomplicated: Secondary | ICD-10-CM | POA: Diagnosis not present

## 2018-10-17 DIAGNOSIS — C786 Secondary malignant neoplasm of retroperitoneum and peritoneum: Secondary | ICD-10-CM | POA: Diagnosis not present

## 2018-10-17 DIAGNOSIS — Z5111 Encounter for antineoplastic chemotherapy: Secondary | ICD-10-CM | POA: Diagnosis not present

## 2018-10-17 DIAGNOSIS — J449 Chronic obstructive pulmonary disease, unspecified: Secondary | ICD-10-CM | POA: Diagnosis not present

## 2018-10-17 LAB — CBC WITH DIFFERENTIAL/PLATELET
Abs Immature Granulocytes: 0.03 10*3/uL (ref 0.00–0.07)
Basophils Absolute: 0.1 10*3/uL (ref 0.0–0.1)
Basophils Relative: 1 %
Eosinophils Absolute: 0.1 10*3/uL (ref 0.0–0.5)
Eosinophils Relative: 2 %
HCT: 36.3 % — ABNORMAL LOW (ref 39.0–52.0)
Hemoglobin: 12.4 g/dL — ABNORMAL LOW (ref 13.0–17.0)
Immature Granulocytes: 1 %
Lymphocytes Relative: 29 %
Lymphs Abs: 1.5 10*3/uL (ref 0.7–4.0)
MCH: 33 pg (ref 26.0–34.0)
MCHC: 34.2 g/dL (ref 30.0–36.0)
MCV: 96.5 fL (ref 80.0–100.0)
Monocytes Absolute: 1 10*3/uL (ref 0.1–1.0)
Monocytes Relative: 20 %
Neutro Abs: 2.4 10*3/uL (ref 1.7–7.7)
Neutrophils Relative %: 47 %
Platelets: 286 10*3/uL (ref 150–400)
RBC: 3.76 MIL/uL — ABNORMAL LOW (ref 4.22–5.81)
RDW: 18.7 % — ABNORMAL HIGH (ref 11.5–15.5)
Smear Review: NORMAL
WBC: 5.1 10*3/uL (ref 4.0–10.5)
nRBC: 0 % (ref 0.0–0.2)

## 2018-10-17 LAB — COMPREHENSIVE METABOLIC PANEL
ALT: 79 U/L — ABNORMAL HIGH (ref 0–44)
AST: 90 U/L — ABNORMAL HIGH (ref 15–41)
Albumin: 3.4 g/dL — ABNORMAL LOW (ref 3.5–5.0)
Alkaline Phosphatase: 760 U/L — ABNORMAL HIGH (ref 38–126)
Anion gap: 9 (ref 5–15)
BUN: 5 mg/dL — ABNORMAL LOW (ref 6–20)
CO2: 23 mmol/L (ref 22–32)
Calcium: 8.9 mg/dL (ref 8.9–10.3)
Chloride: 103 mmol/L (ref 98–111)
Creatinine, Ser: 0.31 mg/dL — ABNORMAL LOW (ref 0.61–1.24)
GFR calc Af Amer: >60 mL/min (ref >60)
GFR calc non Af Amer: >60 mL/min (ref >60)
Glucose, Bld: 106 mg/dL — ABNORMAL HIGH (ref 70–99)
Potassium: 3.8 mmol/L (ref 3.5–5.1)
Sodium: 135 mmol/L (ref 135–145)
Total Bilirubin: 0.7 mg/dL (ref 0.3–1.2)
Total Protein: 6.8 g/dL (ref 6.5–8.1)

## 2018-10-17 MED ORDER — DIPHENHYDRAMINE HCL 50 MG/ML IJ SOLN
25.0000 mg | Freq: Once | INTRAMUSCULAR | Status: AC
  Administered 2018-10-17: 25 mg via INTRAVENOUS
  Filled 2018-10-17: qty 1

## 2018-10-17 MED ORDER — SODIUM CHLORIDE 0.9 % IV SOLN
1600.0000 mg | Freq: Once | INTRAVENOUS | Status: AC
  Administered 2018-10-17: 1600 mg via INTRAVENOUS
  Filled 2018-10-17: qty 26.3

## 2018-10-17 MED ORDER — METHYLPREDNISOLONE SODIUM SUCC 125 MG IJ SOLR
40.0000 mg | Freq: Once | INTRAMUSCULAR | Status: AC
  Administered 2018-10-17: 40 mg via INTRAVENOUS
  Filled 2018-10-17: qty 2

## 2018-10-17 MED ORDER — SODIUM CHLORIDE 0.9% FLUSH
10.0000 mL | INTRAVENOUS | Status: DC | PRN
  Administered 2018-10-17: 10 mL via INTRAVENOUS
  Filled 2018-10-17: qty 10

## 2018-10-17 MED ORDER — SODIUM CHLORIDE 0.9 % IV SOLN
Freq: Once | INTRAVENOUS | Status: AC
  Administered 2018-10-17: 10:00:00 via INTRAVENOUS
  Filled 2018-10-17: qty 250

## 2018-10-17 MED ORDER — HEPARIN SOD (PORK) LOCK FLUSH 100 UNIT/ML IV SOLN
500.0000 [IU] | Freq: Once | INTRAVENOUS | Status: AC
  Administered 2018-10-17: 500 [IU] via INTRAVENOUS
  Filled 2018-10-17: qty 5

## 2018-10-17 MED ORDER — PROCHLORPERAZINE MALEATE 10 MG PO TABS
10.0000 mg | ORAL_TABLET | Freq: Once | ORAL | Status: AC
  Administered 2018-10-17: 10 mg via ORAL
  Filled 2018-10-17: qty 1

## 2018-10-17 MED ORDER — FAMOTIDINE IN NACL 20-0.9 MG/50ML-% IV SOLN
20.0000 mg | Freq: Once | INTRAVENOUS | Status: AC
  Administered 2018-10-17: 20 mg via INTRAVENOUS
  Filled 2018-10-17: qty 50

## 2018-10-17 NOTE — Progress Notes (Signed)
Patient stated that he had been doing well with no complaints. 

## 2018-10-20 ENCOUNTER — Other Ambulatory Visit: Payer: Self-pay | Admitting: Oncology

## 2018-10-22 NOTE — Progress Notes (Signed)
[place]  Telephone:([phone] [phone]  ID: Benjamin Diaz. OB: June 26, 1960  MR#: [id number]  XIH#:[id number]  Patient Care Team: Patient, [name] as PCP - General (General Practice) [name], MD as Consulting Physician (Oncology) [name], MD (Inactive) as Consulting Physician (Gastroenterology) [name], MD as Consulting Physician (Gastroenterology)  CHIEF COMPLAINT: Stage IV pancreatic cancer  INTERVAL HISTORY: Patient returns to clinic today for further evaluation and consideration of cycle 6, day 15 of single agent gemcitabine.  He continues to feel well and remains asymptomatic. He continues to have chronic weakness and fatigue.  He has a fair appetite.  He has no neurologic complaints.  He denies any recent fevers or illnesses.  He denies any chest pain, shortness of breath, cough, or hemoptysis.  He has no abdominal pain, but admits to occasional back pain.  He denies any nausea, vomiting, constipation, or diarrhea. He has no urinary complaints.  Patient offers no further specific complaints today.  REVIEW OF SYSTEMS:   Review of Systems  Constitutional: Positive for malaise/fatigue. Negative for fever and weight loss.  Respiratory: Negative.  Negative for cough and shortness of breath.   Cardiovascular: Negative.  Negative for chest pain and leg swelling.  Gastrointestinal: Negative.  Negative for abdominal pain, diarrhea, nausea and vomiting.  Genitourinary: Negative.  Negative for dysuria, flank pain, frequency and urgency.  Musculoskeletal: Positive for back pain. Negative for joint pain.  Skin: Negative.  Negative for itching and rash.  Neurological: Positive for weakness. Negative for tingling, tremors, sensory change and focal weakness.  Psychiatric/Behavioral: Negative.  The patient is not nervous/anxious and does not have insomnia.     As per HPI. Otherwise, a complete review of systems is negative.  PAST  MEDICAL HISTORY: Past Medical History:  Diagnosis Date  . Arthritis   . Asthma   . Atrial fibrillation ([place])   . Closed left ankle fracture age 66  . COPD (chronic obstructive pulmonary disease) ([place])   . Dysrhythmia   . Pancreatic cancer ([place]) 06/2016   Chemo tx's.   . Pneumonia 2011  . Right shoulder injury 09/27/2016  . Sciatica    left leg pinched nerve numb at times about once a year    PAST SURGICAL HISTORY: Past Surgical History:  Procedure Laterality Date  . BILIARY STENT PLACEMENT N/A 09/01/2016   Procedure: BILIARY STENT PLACEMENT;  Surgeon: [name], MD;  [facility]: [facility];  Service: Endoscopy;  Laterality: N/A;  . DIAGNOSTIC LAPAROSCOPIC LIVER BIOPSY  09/16/2016   Procedure: DIAGNOSTIC LAPAROSCOPIC LIVER BIOPSY;  Surgeon: [name], MD;  Location: [facility];  Service: General;;  . ERCP N/A 07/09/2016   Procedure: ENDOSCOPIC RETROGRADE CHOLANGIOPANCREATOGRAPHY (ERCP);  Surgeon: [name], MD;  Location: [facility];  Service: Endoscopy;  Laterality: N/A;  . ERCP N/A 09/01/2016   Procedure: ENDOSCOPIC RETROGRADE CHOLANGIOPANCREATOGRAPHY (ERCP);  Surgeon: [name], MD;  Location: [facility];  Service: Endoscopy;  Laterality: N/A;  . ERCP N/A 06/28/2017   Procedure: ENDOSCOPIC RETROGRADE CHOLANGIOPANCREATOGRAPHY (ERCP);  Surgeon: [name], MD;  Location[facility];  Service: Endoscopy;  Laterality: N/A;  . ESOPHAGOGASTRODUODENOSCOPY (EGD) WITH PROPOFOL N/A 09/01/2016   Procedure: ESOPHAGOGASTRODUODENOSCOPY (EGD) WITH PROPOFOL;  Surgeon: [name], MD;  [facility];  Service: Gastroenterology;  Laterality: N/A;  . EUS N/A 09/01/2016   Procedure: ESOPHAGEAL ENDOSCOPIC ULTRASOUND (EUS) RADIAL;  Surgeon: [name], MD;  [facility];  Service: Endoscopy;  Laterality: N/A;  . FINE NEEDLE ASPIRATION N/A 09/01/2016  Procedure: FINE NEEDLE ASPIRATION (FNA) RADIAL;  Surgeon: [name], MD;  [facility];  Service:  Endoscopy;  Laterality: N/A;  . FOREIGN BODY REMOVAL N/A 09/01/2016   Procedure: FOREIGN BODY REMOVAL;  Surgeon: [name], MD;  [facility];  Service: Gastroenterology;  Laterality: N/A;  . KNEE SURGERY Right 2002   arthroscopy  . PORTA CATH INSERTION N/A 09/20/2017   Procedure: PORTA CATH INSERTION;  Surgeon: [name], MD;  Location: [facility];  Service: Cardiovascular;  Laterality: N/A;  . SHOULDER SURGERY Right 2002    FAMILY HISTORY: Family History  Problem Relation Age of Onset  . Heart disease Father     ADVANCED DIRECTIVES (Y/N):  N  HEALTH MAINTENANCE: Social History   Tobacco Use  . Smoking status: Current Every Day Smoker    Packs/day: 1.00    Years: 40.00    Pack years: 40.00    Types: Cigarettes  . Smokeless tobacco: Never Used  Substance Use Topics  . Alcohol use: Yes    Alcohol/week: 7.0 standard drinks    Types: 7 Cans of beer per week    Comment: last drink 5 weeeks ago  april 2018  . Drug use: No     Colonoscopy:  PAP:  Bone density:  Lipid panel:  Allergies  Allergen Reactions  . No Known Allergies     Current Outpatient Medications  Medication Sig Dispense Refill  . albuterol (VENTOLIN HFA) 108 (90 Base) MCG/ACT inhaler Inhale 1-2 puffs into the lungs every 6 (six) hours as needed for wheezing or shortness of breath. 1 Inhaler 6  . Ipratropium-Albuterol (COMBIVENT RESPIMAT) 20-100 MCG/ACT AERS respimat Inhale 1 puff into the lungs 4 (four) times daily. 1 Inhaler 6  . megestrol (MEGACE) 40 MG tablet Take 1 tablet (40 mg total) by mouth daily. 30 tablet 2  . Oxycodone HCl 10 MG TABS Take 1 tablet (10 mg total) by mouth 2 (two) times daily as needed. 60 tablet 0  . prochlorperazine (COMPAZINE) 10 MG tablet Take 1 tablet (10 mg total) by mouth every 6 (six) hours as needed for nausea or vomiting. 60 tablet 0  . traMADol (ULTRAM) 50 MG tablet Take 1 tablet (50 mg total) by mouth every 6 (six) hours as needed. 30 tablet  0  . zolpidem (AMBIEN) 10 MG tablet Take 1 tablet (10 mg total) by mouth at bedtime as needed for up to 30 days for sleep. 30 tablet 0   No current facility-administered medications for this visit.    OBJECTIVE: Vitals:   10/24/18 0853  BP: 125/83  Pulse: 82  Temp: 98.4 F (36.9 C)     Body mass index is 15.37 kg/m.    ECOG FS:0 - Asymptomatic  General: Thin, no acute distress. Eyes: Pink conjunctiva, anicteric sclera. HEENT: Normocephalic, moist mucous membranes. Lungs: Clear to auscultation bilaterally. Heart: Regular rate and rhythm. No rubs, murmurs, or gallops. Abdomen: Soft, nontender, nondistended. No organomegaly noted, normoactive bowel sounds. Musculoskeletal: No edema, cyanosis, or clubbing. Neuro: Alert, answering all questions appropriately. Cranial nerves grossly intact. Skin: No rashes or petechiae noted. Psych: Normal affect.  LAB RESULTS:  Lab Results  Component Value Date   NA 133 (L) 10/24/2018   K 3.7 10/24/2018   CL 101 10/24/2018   CO2 23 10/24/2018   GLUCOSE 117 (H) 10/24/2018   BUN 7 10/24/2018   CREATININE 0.42 (L) 10/24/2018   CALCIUM 8.7 (L) 10/24/2018   PROT 6.4 (L) 10/24/2018   ALBUMIN 3.4 (  L) 10/24/2018   AST 106 (H) 10/24/2018   ALT 118 (H) 10/24/2018   ALKPHOS 729 (H) 10/24/2018   BILITOT 0.7 10/24/2018   GFRNONAA >60 10/24/2018   GFRAA >60 10/24/2018    Lab Results  Component Value Date   WBC 5.3 10/24/2018   NEUTROABS 2.8 10/24/2018   HGB 12.3 (L) 10/24/2018   HCT 36.0 (L) 10/24/2018   MCV 96.0 10/24/2018   PLT 109 (L) 10/24/2018      STUDIES: No results found.  ASSESSMENT: Stage IV pancreatic cancer  PLAN:   1. Stage IV pancreatic cancer: Imaging, pathology, and Op note reviewed independently confirming stage IV disease with multiple peritoneal implants.  Patient's initial diagnosis was on September 01, 2016.  CT scan results from September 11, 2018 reviewed independently with persistent disease in patient's pancreas, but no  other findings suggestive of metastatic disease in the abdomen or pelvis.  His CA 19-9 has trended up slightly to 179.  Patient wishes to continue treatment, but expressed understanding that his treatment options are limited.  He has also expressed interest in enrolling in a clinical trial.  Patient previously stated he has no interest in hospice and if chemotherapy were not an option he would like to pursue surgery or ablation.  We discussed that this is likely not possible.  Proceed with cycle 6, day 15 of gemcitabine only today.  Return to clinic in 2 weeks for further evaluation and consideration of cycle 7, day 1.   2.  Elevated liver enzymes: Slightly worse this week.  Gemcitabine does not need to be dose reduced and liver dysfunction.  Patient has not been evaluated by GI secondary to transportation issues.  Proceed with treatment as above. 3. Hyponatremia: Mild, monitor. 4. Peripheral edema: Continue elevation nightly.  Lower extremity ultrasound did not reveal DVT.   5.  Elevated bilirubin: Resolved. 6.  Pain: Chronic and unchanged.  Continue oxycodone as needed. 7.  Poor appetite: Patient has Megace prescribed, but is unclear his compliance.   8.  Shortness of breath: Chronic and unchanged.  Patient does not complain of this today.  Continue inhalers as prescribed. 9.  Shoulder pain/tremor: Patient does not complain of this today.  Patient was previously given a referral to orthopedics as he is received injections in the past that improved his symptoms. 10.  Insomnia: Improved with Ambien.  Continue to use as needed. 11.  Rash: Resolved.  Secondary gemcitabine.  Improved with Medrol Dosepak.  Continue premedications as ordered for chemotherapy.  12.  Thrombocytopenia: Patient's platelet count is 109 today.  Monitor.   Patient expressed understanding and was in agreement with this plan. He also understands that He can call clinic at any time with any questions, concerns, or complaints.    Cancer Staging Pancreatic cancer [facility]) Staging form: Exocrine Pancreas, AJCC 8th Edition - Clinical stage from 09/25/2016: Stage IV (cTX, cNX, pM1) - Signed by Lloyd Huger, MD on 09/25/2016   Lloyd Huger, MD   10/26/2018 6:27 AM

## 2018-10-23 ENCOUNTER — Other Ambulatory Visit: Payer: Self-pay

## 2018-10-24 ENCOUNTER — Inpatient Hospital Stay: Payer: Medicaid Other

## 2018-10-24 ENCOUNTER — Inpatient Hospital Stay (HOSPITAL_BASED_OUTPATIENT_CLINIC_OR_DEPARTMENT_OTHER): Payer: Medicaid Other | Admitting: Oncology

## 2018-10-24 ENCOUNTER — Other Ambulatory Visit: Payer: Self-pay

## 2018-10-24 VITALS — BP 125/83 | HR 82 | Temp 98.4°F | Wt 113.3 lb

## 2018-10-24 DIAGNOSIS — I4891 Unspecified atrial fibrillation: Secondary | ICD-10-CM | POA: Diagnosis not present

## 2018-10-24 DIAGNOSIS — C259 Malignant neoplasm of pancreas, unspecified: Secondary | ICD-10-CM

## 2018-10-24 DIAGNOSIS — E871 Hypo-osmolality and hyponatremia: Secondary | ICD-10-CM

## 2018-10-24 DIAGNOSIS — C786 Secondary malignant neoplasm of retroperitoneum and peritoneum: Secondary | ICD-10-CM | POA: Diagnosis not present

## 2018-10-24 DIAGNOSIS — D696 Thrombocytopenia, unspecified: Secondary | ICD-10-CM | POA: Diagnosis not present

## 2018-10-24 DIAGNOSIS — Z95828 Presence of other vascular implants and grafts: Secondary | ICD-10-CM

## 2018-10-24 DIAGNOSIS — Z5111 Encounter for antineoplastic chemotherapy: Secondary | ICD-10-CM | POA: Diagnosis not present

## 2018-10-24 DIAGNOSIS — F17210 Nicotine dependence, cigarettes, uncomplicated: Secondary | ICD-10-CM

## 2018-10-24 LAB — CBC WITH DIFFERENTIAL/PLATELET
Abs Immature Granulocytes: 0.04 10*3/uL (ref 0.00–0.07)
Basophils Absolute: 0.1 10*3/uL (ref 0.0–0.1)
Basophils Relative: 1 %
Eosinophils Absolute: 0.1 10*3/uL (ref 0.0–0.5)
Eosinophils Relative: 2 %
HCT: 36 % — ABNORMAL LOW (ref 39.0–52.0)
Hemoglobin: 12.3 g/dL — ABNORMAL LOW (ref 13.0–17.0)
Immature Granulocytes: 1 %
Lymphocytes Relative: 29 %
Lymphs Abs: 1.5 10*3/uL (ref 0.7–4.0)
MCH: 32.8 pg (ref 26.0–34.0)
MCHC: 34.2 g/dL (ref 30.0–36.0)
MCV: 96 fL (ref 80.0–100.0)
Monocytes Absolute: 0.8 10*3/uL (ref 0.1–1.0)
Monocytes Relative: 15 %
Neutro Abs: 2.8 10*3/uL (ref 1.7–7.7)
Neutrophils Relative %: 52 %
Platelets: 109 10*3/uL — ABNORMAL LOW (ref 150–400)
RBC: 3.75 MIL/uL — ABNORMAL LOW (ref 4.22–5.81)
RDW: 19.4 % — ABNORMAL HIGH (ref 11.5–15.5)
Smear Review: NORMAL
WBC Morphology: ABNORMAL
WBC: 5.3 10*3/uL (ref 4.0–10.5)
nRBC: 0.4 % — ABNORMAL HIGH (ref 0.0–0.2)

## 2018-10-24 LAB — COMPREHENSIVE METABOLIC PANEL
ALT: 118 U/L — ABNORMAL HIGH (ref 0–44)
AST: 106 U/L — ABNORMAL HIGH (ref 15–41)
Albumin: 3.4 g/dL — ABNORMAL LOW (ref 3.5–5.0)
Alkaline Phosphatase: 729 U/L — ABNORMAL HIGH (ref 38–126)
Anion gap: 9 (ref 5–15)
BUN: 7 mg/dL (ref 6–20)
CO2: 23 mmol/L (ref 22–32)
Calcium: 8.7 mg/dL — ABNORMAL LOW (ref 8.9–10.3)
Chloride: 101 mmol/L (ref 98–111)
Creatinine, Ser: 0.42 mg/dL — ABNORMAL LOW (ref 0.61–1.24)
GFR calc Af Amer: >60 mL/min (ref >60)
GFR calc non Af Amer: >60 mL/min (ref >60)
Glucose, Bld: 117 mg/dL — ABNORMAL HIGH (ref 70–99)
Potassium: 3.7 mmol/L (ref 3.5–5.1)
Sodium: 133 mmol/L — ABNORMAL LOW (ref 135–145)
Total Bilirubin: 0.7 mg/dL (ref 0.3–1.2)
Total Protein: 6.4 g/dL — ABNORMAL LOW (ref 6.5–8.1)

## 2018-10-24 MED ORDER — SODIUM CHLORIDE 0.9 % IV SOLN
1600.0000 mg | Freq: Once | INTRAVENOUS | Status: AC
  Administered 2018-10-24: 1600 mg via INTRAVENOUS
  Filled 2018-10-24: qty 26.3

## 2018-10-24 MED ORDER — FAMOTIDINE IN NACL 20-0.9 MG/50ML-% IV SOLN
20.0000 mg | Freq: Once | INTRAVENOUS | Status: AC
  Administered 2018-10-24: 20 mg via INTRAVENOUS
  Filled 2018-10-24: qty 50

## 2018-10-24 MED ORDER — DIPHENHYDRAMINE HCL 50 MG/ML IJ SOLN
25.0000 mg | Freq: Once | INTRAMUSCULAR | Status: AC
  Administered 2018-10-24: 25 mg via INTRAVENOUS
  Filled 2018-10-24: qty 1

## 2018-10-24 MED ORDER — HEPARIN SOD (PORK) LOCK FLUSH 100 UNIT/ML IV SOLN
500.0000 [IU] | Freq: Once | INTRAVENOUS | Status: AC | PRN
  Administered 2018-10-24: 500 [IU]
  Filled 2018-10-24: qty 5

## 2018-10-24 MED ORDER — SODIUM CHLORIDE 0.9 % IV SOLN
Freq: Once | INTRAVENOUS | Status: AC
  Administered 2018-10-24: 10:00:00 via INTRAVENOUS
  Filled 2018-10-24: qty 250

## 2018-10-24 MED ORDER — PROCHLORPERAZINE MALEATE 10 MG PO TABS
10.0000 mg | ORAL_TABLET | Freq: Once | ORAL | Status: AC
  Administered 2018-10-24: 10 mg via ORAL
  Filled 2018-10-24: qty 1

## 2018-10-24 MED ORDER — SODIUM CHLORIDE 0.9% FLUSH
10.0000 mL | Freq: Once | INTRAVENOUS | Status: AC
  Administered 2018-10-24: 10 mL via INTRAVENOUS
  Filled 2018-10-24: qty 10

## 2018-10-24 MED ORDER — METHYLPREDNISOLONE SODIUM SUCC 125 MG IJ SOLR
40.0000 mg | Freq: Once | INTRAMUSCULAR | Status: AC
  Administered 2018-10-24: 10:00:00 40 mg via INTRAVENOUS
  Filled 2018-10-24: qty 2

## 2018-10-25 LAB — CANCER ANTIGEN 19-9: CA 19-9: 179 U/mL — ABNORMAL HIGH (ref 0–35)

## 2018-10-30 ENCOUNTER — Other Ambulatory Visit: Payer: Self-pay | Admitting: Oncology

## 2018-11-04 NOTE — Progress Notes (Signed)
[place]  [phone] [phone]  ID: Benjamin Diaz. OB: 17-Sep-1960  MR#: [id number]  RSW#:[id number]  Patient Care Team: Patient, [name] as PCP - General (General Practice) [name], MD as Consulting Physician (Oncology) [name], MD (Inactive) as Consulting Physician (Gastroenterology) [name], MD as Consulting Physician (Gastroenterology)  CHIEF COMPLAINT: Stage IV pancreatic cancer  INTERVAL HISTORY: Patient returns to clinic today for further evaluation and consideration of cycle 7, day 1 of single agent gemcitabine.  He continues to feel well and remains asymptomatic.  He does not complain of pain today.  He has chronic weakness and fatigue. He has a fair appetite.  He has no neurologic complaints.  He denies any recent fevers or illnesses.  He denies any chest pain, shortness of breath, cough, or hemoptysis.  He has no abdominal pain, but admits to occasional back pain.  He denies any nausea, vomiting, constipation, or diarrhea. He has no urinary complaints.  Patient offers no further specific complaints today.  REVIEW OF SYSTEMS:   Review of Systems  Constitutional: Positive for malaise/fatigue. Negative for fever and weight loss.  Respiratory: Negative.  Negative for cough and shortness of breath.   Cardiovascular: Negative.  Negative for chest pain and leg swelling.  Gastrointestinal: Negative.  Negative for abdominal pain, diarrhea, nausea and vomiting.  Genitourinary: Negative.  Negative for dysuria, flank pain, frequency and urgency.  Musculoskeletal: Negative.  Negative for back pain and joint pain.  Skin: Negative.  Negative for itching and rash.  Neurological: Positive for weakness. Negative for tingling, tremors, sensory change and focal weakness.  Psychiatric/Behavioral: Negative.  The patient is not nervous/anxious and does not have insomnia.     As per HPI. Otherwise, a complete review of systems  is negative.  PAST MEDICAL HISTORY: Past Medical History:  Diagnosis Date  . Arthritis   . Asthma   . Atrial fibrillation ([place])   . Closed left ankle fracture age 64  . COPD (chronic obstructive pulmonary disease) ([place])   . Dysrhythmia   . Pancreatic cancer ([place]) 06/2016   Chemo tx's.   . Pneumonia 2011  . Right shoulder injury 09/27/2016  . Sciatica    left leg pinched nerve numb at times about once a year    PAST SURGICAL HISTORY: Past Surgical History:  Procedure Laterality Date  . BILIARY STENT PLACEMENT N/A 09/01/2016   Procedure: BILIARY STENT PLACEMENT;  Surgeon: [name], MD;  [facility];  Service: Endoscopy;  Laterality: N/A;  . DIAGNOSTIC LAPAROSCOPIC LIVER BIOPSY  09/16/2016   Procedure: DIAGNOSTIC LAPAROSCOPIC LIVER BIOPSY;  Surgeon: [name], MD;  Location: [place];  Service: General;;  . ERCP N/A 07/09/2016   Procedure: ENDOSCOPIC RETROGRADE CHOLANGIOPANCREATOGRAPHY (ERCP);  Surgeon: [name], MD;  Location: [facility];  Service: Endoscopy;  Laterality: N/A;  . ERCP N/A 09/01/2016   Procedure: ENDOSCOPIC RETROGRADE CHOLANGIOPANCREATOGRAPHY (ERCP);  Surgeon: [name], MD;  Location: [facility];  Service: Endoscopy;  Laterality: N/A;  . ERCP N/A 06/28/2017   Procedure: ENDOSCOPIC RETROGRADE CHOLANGIOPANCREATOGRAPHY (ERCP);  Surgeon: [name], MD;  Location: [facility] ENDOSCOPY;  Service: Endoscopy;  Laterality: N/A;  . ESOPHAGOGASTRODUODENOSCOPY (EGD) WITH PROPOFOL N/A 09/01/2016   Procedure: ESOPHAGOGASTRODUODENOSCOPY (EGD) WITH PROPOFOL;  Surgeon: [name], MD;  [facility];  Service: Gastroenterology;  Laterality: N/A;  . EUS N/A 09/01/2016   Procedure: ESOPHAGEAL ENDOSCOPIC ULTRASOUND (EUS) RADIAL;  Surgeon: [name], MD;  [facility];  Service: Endoscopy;  Laterality: N/A;  .  FINE NEEDLE ASPIRATION N/A 09/01/2016   Procedure: FINE NEEDLE ASPIRATION (FNA) RADIAL;  Surgeon: [name], MD;  [facility];  Service: Endoscopy;  Laterality: N/A;  . FOREIGN BODY REMOVAL N/A 09/01/2016   Procedure: FOREIGN BODY REMOVAL;  Surgeon: [name], MD;  [facility];  Service: Gastroenterology;  Laterality: N/A;  . KNEE SURGERY Right 2002   arthroscopy  . PORTA CATH INSERTION N/A 09/20/2017   Procedure: PORTA CATH INSERTION;  Surgeon: [name], MD;  Location: [place] [facility];  Service: Cardiovascular;  Laterality: N/A;  . SHOULDER SURGERY Right 2002    FAMILY HISTORY: Family History  Problem Relation Age of Onset  . Heart disease Father     ADVANCED DIRECTIVES (Y/N):  N  HEALTH MAINTENANCE: Social History   Tobacco Use  . Smoking status: Current Every Day Smoker    Packs/day: 1.00    Years: 40.00    Pack years: 40.00    Types: Cigarettes  . Smokeless tobacco: Never Used  Substance Use Topics  . Alcohol use: Yes    Alcohol/week: 7.0 standard drinks    Types: 7 Cans of beer per week    Comment: last drink 5 weeeks ago  april 2018  . Drug use: No     Colonoscopy:  PAP:  Bone density:  Lipid panel:  Allergies  Allergen Reactions  . No Known Allergies     Current Outpatient Medications  Medication Sig Dispense Refill  . albuterol (VENTOLIN HFA) 108 (90 Base) MCG/ACT inhaler Inhale 1-2 puffs into the lungs every 6 (six) hours as needed for wheezing or shortness of breath. 1 Inhaler 6  . Ipratropium-Albuterol (COMBIVENT RESPIMAT) 20-100 MCG/ACT AERS respimat Inhale 1 puff into the lungs 4 (four) times daily. 1 Inhaler 6  . megestrol (MEGACE) 40 MG tablet Take 1 tablet (40 mg total) by mouth daily. 30 tablet 2  . Oxycodone HCl 10 MG TABS Take 1 tablet (10 mg total) by mouth 2 (two) times daily as needed. 60 tablet 0  . prochlorperazine (COMPAZINE) 10 MG tablet Take 1 tablet (10 mg total) by mouth every 6 (six) hours as needed for nausea or vomiting. 60 tablet 0  . traMADol (ULTRAM) 50 MG tablet Take 1 tablet (50 mg total) by mouth every 6 (six) hours  as needed. 30 tablet 0  . zolpidem (AMBIEN) 10 MG tablet Take 1 tablet (10 mg total) by mouth at bedtime as needed for up to 30 days for sleep. 30 tablet 0   No current facility-administered medications for this visit.    OBJECTIVE: Vitals:   11/07/18 0930  BP: (!) 147/84  Pulse: 76  Temp: (!) 97.5 F (36.4 C)     Body mass index is 15.43 kg/m.    ECOG FS:0 - Asymptomatic  General: Thin, no acute distress. Eyes: Pink conjunctiva, anicteric sclera. HEENT: Normocephalic, moist mucous membranes. Lungs: Clear to auscultation bilaterally. Heart: Regular rate and rhythm. No rubs, murmurs, or gallops. Abdomen: Soft, nontender, nondistended. No organomegaly noted, normoactive bowel sounds. Musculoskeletal: No edema, cyanosis, or clubbing. Neuro: Alert, answering all questions appropriately. Cranial nerves grossly intact. Skin: No rashes or petechiae noted. Psych: Normal affect.  LAB RESULTS:  Lab Results  Component Value Date   NA 135 11/07/2018   K 3.8 11/07/2018   CL 102 11/07/2018   CO2 26 11/07/2018   GLUCOSE 95 11/07/2018   BUN 5 (L) 11/07/2018   CREATININE 0.34 (L) 11/07/2018   CALCIUM 8.9 11/07/2018   PROT  6.4 (L) 11/07/2018   ALBUMIN 3.4 (L) 11/07/2018   AST 44 (H) 11/07/2018   ALT 42 11/07/2018   ALKPHOS 573 (H) 11/07/2018   BILITOT 0.7 11/07/2018   GFRNONAA >60 11/07/2018   GFRAA >60 11/07/2018    Lab Results  Component Value Date   WBC 9.0 11/07/2018   NEUTROABS 5.5 11/07/2018   HGB 12.6 (L) 11/07/2018   HCT 37.2 (L) 11/07/2018   MCV 96.6 11/07/2018   PLT 272 11/07/2018      STUDIES: No results found.  ASSESSMENT: Stage IV pancreatic cancer  PLAN:   1. Stage IV pancreatic cancer: Imaging, pathology, and Op note reviewed independently confirming stage IV disease with multiple peritoneal implants.  Patient's initial diagnosis was on September 01, 2016.  CT scan results from September 11, 2018 reviewed independently with persistent disease in patient's  pancreas, but no other findings suggestive of metastatic disease in the abdomen or pelvis.  His CA 19-9 has trended up slightly to 179.  Patient wishes to continue treatment, but expressed understanding that his treatment options are limited.  He has also expressed interest in enrolling in a clinical trial.  Patient previously stated he has no interest in hospice and if chemotherapy were not an option he would like to pursue surgery or ablation.  We discussed that this is likely not possible.  Proceed with cycle 7, day 1 of gemcitabine only today.  Return to clinic in 1 week for further evaluation and consideration of cycle 7, day 8.   2.  Elevated liver enzymes: Improving.  Gemcitabine does not need to be dose reduced and liver dysfunction.  Patient has not been evaluated by GI secondary to transportation issues.  Proceed with treatment as above. 3. Hyponatremia: Resolved. 4. Peripheral edema: Continue elevation nightly.  Lower extremity ultrasound did not reveal DVT.   5.  Elevated bilirubin: Resolved. 6.  Pain: Chronic and unchanged.  Continue oxycodone as needed. 7.  Poor appetite: Patient has Megace prescribed, but is unclear his compliance.   8.  Shortness of breath: Chronic and unchanged.  Patient does not complain of this today.  Continue inhalers as prescribed. 9.  Shoulder pain/tremor: Patient does not complain of this today.  Patient was previously given a referral to orthopedics as he is received injections in the past that improved his symptoms. 10.  Insomnia: Improved with Ambien.  Continue to use as needed. 11.  Rash: Resolved.  Secondary gemcitabine.  Improved with Medrol Dosepak.  Continue premedications as ordered for chemotherapy.  12.  Thrombocytopenia: Resolved.   Patient expressed understanding and was in agreement with this plan. He also understands that He can call clinic at any time with any questions, concerns, or complaints.   Cancer Staging Pancreatic cancer [facility])  Staging form: Exocrine Pancreas, AJCC 8th Edition - Clinical stage from 09/25/2016: Stage IV (cTX, cNX, pM1) - Signed by Lloyd Huger, MD on 09/25/2016   Lloyd Huger, MD   11/08/2018 6:30 AM

## 2018-11-06 ENCOUNTER — Other Ambulatory Visit: Payer: Self-pay

## 2018-11-07 ENCOUNTER — Inpatient Hospital Stay (HOSPITAL_BASED_OUTPATIENT_CLINIC_OR_DEPARTMENT_OTHER): Payer: Medicaid Other | Admitting: Oncology

## 2018-11-07 ENCOUNTER — Inpatient Hospital Stay: Payer: Medicaid Other

## 2018-11-07 ENCOUNTER — Other Ambulatory Visit: Payer: Self-pay

## 2018-11-07 ENCOUNTER — Encounter: Payer: Self-pay | Admitting: Oncology

## 2018-11-07 ENCOUNTER — Inpatient Hospital Stay: Payer: Medicaid Other | Attending: Oncology

## 2018-11-07 VITALS — BP 147/84 | HR 76 | Temp 97.5°F | Ht 72.0 in | Wt 113.8 lb

## 2018-11-07 DIAGNOSIS — R5383 Other fatigue: Secondary | ICD-10-CM | POA: Diagnosis not present

## 2018-11-07 DIAGNOSIS — F17210 Nicotine dependence, cigarettes, uncomplicated: Secondary | ICD-10-CM | POA: Diagnosis not present

## 2018-11-07 DIAGNOSIS — Z79899 Other long term (current) drug therapy: Secondary | ICD-10-CM | POA: Diagnosis not present

## 2018-11-07 DIAGNOSIS — G8929 Other chronic pain: Secondary | ICD-10-CM | POA: Insufficient documentation

## 2018-11-07 DIAGNOSIS — Z95828 Presence of other vascular implants and grafts: Secondary | ICD-10-CM

## 2018-11-07 DIAGNOSIS — D696 Thrombocytopenia, unspecified: Secondary | ICD-10-CM | POA: Insufficient documentation

## 2018-11-07 DIAGNOSIS — C259 Malignant neoplasm of pancreas, unspecified: Secondary | ICD-10-CM

## 2018-11-07 DIAGNOSIS — J449 Chronic obstructive pulmonary disease, unspecified: Secondary | ICD-10-CM | POA: Diagnosis not present

## 2018-11-07 DIAGNOSIS — Z5111 Encounter for antineoplastic chemotherapy: Secondary | ICD-10-CM | POA: Diagnosis present

## 2018-11-07 DIAGNOSIS — I4891 Unspecified atrial fibrillation: Secondary | ICD-10-CM | POA: Insufficient documentation

## 2018-11-07 LAB — CBC WITH DIFFERENTIAL/PLATELET
Abs Immature Granulocytes: 0.03 10*3/uL (ref 0.00–0.07)
Basophils Absolute: 0.1 10*3/uL (ref 0.0–0.1)
Basophils Relative: 1 %
Eosinophils Absolute: 0.9 10*3/uL — ABNORMAL HIGH (ref 0.0–0.5)
Eosinophils Relative: 10 %
HCT: 37.2 % — ABNORMAL LOW (ref 39.0–52.0)
Hemoglobin: 12.6 g/dL — ABNORMAL LOW (ref 13.0–17.0)
Immature Granulocytes: 0 %
Lymphocytes Relative: 15 %
Lymphs Abs: 1.3 10*3/uL (ref 0.7–4.0)
MCH: 32.7 pg (ref 26.0–34.0)
MCHC: 33.9 g/dL (ref 30.0–36.0)
MCV: 96.6 fL (ref 80.0–100.0)
Monocytes Absolute: 1.2 10*3/uL — ABNORMAL HIGH (ref 0.1–1.0)
Monocytes Relative: 13 %
Neutro Abs: 5.5 10*3/uL (ref 1.7–7.7)
Neutrophils Relative %: 61 %
Platelets: 272 10*3/uL (ref 150–400)
RBC: 3.85 MIL/uL — ABNORMAL LOW (ref 4.22–5.81)
RDW: 20.8 % — ABNORMAL HIGH (ref 11.5–15.5)
WBC: 9 10*3/uL (ref 4.0–10.5)
nRBC: 0 % (ref 0.0–0.2)

## 2018-11-07 LAB — COMPREHENSIVE METABOLIC PANEL
ALT: 42 U/L (ref 0–44)
AST: 44 U/L — ABNORMAL HIGH (ref 15–41)
Albumin: 3.4 g/dL — ABNORMAL LOW (ref 3.5–5.0)
Alkaline Phosphatase: 573 U/L — ABNORMAL HIGH (ref 38–126)
Anion gap: 7 (ref 5–15)
BUN: 5 mg/dL — ABNORMAL LOW (ref 6–20)
CO2: 26 mmol/L (ref 22–32)
Calcium: 8.9 mg/dL (ref 8.9–10.3)
Chloride: 102 mmol/L (ref 98–111)
Creatinine, Ser: 0.34 mg/dL — ABNORMAL LOW (ref 0.61–1.24)
GFR calc Af Amer: >60 mL/min (ref >60)
GFR calc non Af Amer: >60 mL/min (ref >60)
Glucose, Bld: 95 mg/dL (ref 70–99)
Potassium: 3.8 mmol/L (ref 3.5–5.1)
Sodium: 135 mmol/L (ref 135–145)
Total Bilirubin: 0.7 mg/dL (ref 0.3–1.2)
Total Protein: 6.4 g/dL — ABNORMAL LOW (ref 6.5–8.1)

## 2018-11-07 MED ORDER — SODIUM CHLORIDE 0.9 % IV SOLN
Freq: Once | INTRAVENOUS | Status: AC
  Administered 2018-11-07: 10:00:00 via INTRAVENOUS
  Filled 2018-11-07: qty 250

## 2018-11-07 MED ORDER — SODIUM CHLORIDE 0.9 % IV SOLN
1600.0000 mg | Freq: Once | INTRAVENOUS | Status: AC
  Administered 2018-11-07: 1600 mg via INTRAVENOUS
  Filled 2018-11-07: qty 26.3

## 2018-11-07 MED ORDER — SODIUM CHLORIDE 0.9% FLUSH
10.0000 mL | Freq: Once | INTRAVENOUS | Status: AC
  Administered 2018-11-07: 10 mL via INTRAVENOUS
  Filled 2018-11-07: qty 10

## 2018-11-07 MED ORDER — HEPARIN SOD (PORK) LOCK FLUSH 100 UNIT/ML IV SOLN
500.0000 [IU] | Freq: Once | INTRAVENOUS | Status: AC | PRN
  Administered 2018-11-07: 500 [IU]
  Filled 2018-11-07: qty 5

## 2018-11-07 MED ORDER — FAMOTIDINE IN NACL 20-0.9 MG/50ML-% IV SOLN
20.0000 mg | Freq: Once | INTRAVENOUS | Status: AC
  Administered 2018-11-07: 20 mg via INTRAVENOUS
  Filled 2018-11-07: qty 50

## 2018-11-07 MED ORDER — PROCHLORPERAZINE MALEATE 10 MG PO TABS
10.0000 mg | ORAL_TABLET | Freq: Once | ORAL | Status: AC
  Administered 2018-11-07: 10 mg via ORAL
  Filled 2018-11-07: qty 1

## 2018-11-07 MED ORDER — DIPHENHYDRAMINE HCL 50 MG/ML IJ SOLN
25.0000 mg | Freq: Once | INTRAMUSCULAR | Status: AC
  Administered 2018-11-07: 25 mg via INTRAVENOUS
  Filled 2018-11-07: qty 1

## 2018-11-07 MED ORDER — METHYLPREDNISOLONE SODIUM SUCC 125 MG IJ SOLR
40.0000 mg | Freq: Once | INTRAMUSCULAR | Status: AC
  Administered 2018-11-07: 40 mg via INTRAVENOUS
  Filled 2018-11-07: qty 2

## 2018-11-07 NOTE — Progress Notes (Signed)
Patient stated that he had been doing well. 

## 2018-11-08 LAB — CANCER ANTIGEN 19-9: CA 19-9: 164 U/mL — ABNORMAL HIGH (ref 0–35)

## 2018-11-10 ENCOUNTER — Other Ambulatory Visit: Payer: Self-pay | Admitting: *Deleted

## 2018-11-10 DIAGNOSIS — C259 Malignant neoplasm of pancreas, unspecified: Secondary | ICD-10-CM

## 2018-11-10 NOTE — Progress Notes (Signed)
[facility]  Telephone:([phone] [phone]  ID: Benjamin Diaz. OB: [birth year]  MR#: [id number]  PNT#:[id number]  Patient Care Team: Patient, [name] as PCP - General (General Practice) [name], MD as Consulting Physician (Oncology) [name], MD (Inactive) as Consulting Physician (Gastroenterology) [name], MD as Consulting Physician (Gastroenterology)  CHIEF COMPLAINT: Stage IV pancreatic cancer  INTERVAL HISTORY: Patient returns to clinic today for further evaluation and consideration of cycle 7, day 15 of single agent gemcitabine.  He continues to feel well and remains asymptomatic.  He does not complain of pain today.  He has chronic weakness and fatigue. He has a fair appetite.  He has no neurologic complaints.  He denies any recent fevers or illnesses.  He denies any chest pain, shortness of breath, cough, or hemoptysis.  He has no abdominal pain, but admits to occasional back pain.  He denies any nausea, vomiting, constipation, or diarrhea. He has no urinary complaints.  Patient feels at his baseline offers no further specific complaints today.  REVIEW OF SYSTEMS:   Review of Systems  Constitutional: Positive for malaise/fatigue. Negative for fever and weight loss.  Respiratory: Negative.  Negative for cough and shortness of breath.   Cardiovascular: Negative.  Negative for chest pain and leg swelling.  Gastrointestinal: Negative.  Negative for abdominal pain, diarrhea, nausea and vomiting.  Genitourinary: Negative.  Negative for dysuria, flank pain, frequency and urgency.  Musculoskeletal: Negative.  Negative for back pain and joint pain.  Skin: Negative.  Negative for itching and rash.  Neurological: Positive for weakness. Negative for tingling, tremors, sensory change and focal weakness.  Psychiatric/Behavioral: Negative.  The patient is not nervous/anxious and does not have insomnia.     As per HPI. Otherwise, a  complete review of systems is negative.  PAST MEDICAL HISTORY: Past Medical History:  Diagnosis Date  . Arthritis   . Asthma   . Atrial fibrillation ([place])   . Closed left ankle fracture age 3  . COPD (chronic obstructive pulmonary disease) ([place])   . Dysrhythmia   . Pancreatic cancer ([place]) 06/2016   Chemo tx's.   . Pneumonia 2011  . Right shoulder injury 09/27/2016  . Sciatica    left leg pinched nerve numb at times about once a year    PAST SURGICAL HISTORY: Past Surgical History:  Procedure Laterality Date  . BILIARY STENT PLACEMENT N/A 09/01/2016   Procedure: BILIARY STENT PLACEMENT;  Surgeon: [name], MD;  [facility]: [facility];  Service: Endoscopy;  Laterality: N/A;  . DIAGNOSTIC LAPAROSCOPIC LIVER BIOPSY  09/16/2016   Procedure: DIAGNOSTIC LAPAROSCOPIC LIVER BIOPSY;  Surgeon: [name], MD;  Location: [facility];  Service: General;;  . ERCP N/A 07/09/2016   Procedure: ENDOSCOPIC RETROGRADE CHOLANGIOPANCREATOGRAPHY (ERCP);  Surgeon: [name], MD;  Location: [facility];  Service: Endoscopy;  Laterality: N/A;  . ERCP N/A 09/01/2016   Procedure: ENDOSCOPIC RETROGRADE CHOLANGIOPANCREATOGRAPHY (ERCP);  Surgeon: [name], MD;  Location: [facility];  Service: Endoscopy;  Laterality: N/A;  . ERCP N/A 06/28/2017   Procedure: ENDOSCOPIC RETROGRADE CHOLANGIOPANCREATOGRAPHY (ERCP);  Surgeon: [name], MD;  Location: [facility] ENDOSCOPY;  Service: Endoscopy;  Laterality: N/A;  . ESOPHAGOGASTRODUODENOSCOPY (EGD) WITH PROPOFOL N/A 09/01/2016   Procedure: ESOPHAGOGASTRODUODENOSCOPY (EGD) WITH PROPOFOL;  Surgeon: [name], MD;  [facility];  Service: Gastroenterology;  Laterality: N/A;  . EUS N/A 09/01/2016   Procedure: ESOPHAGEAL ENDOSCOPIC ULTRASOUND (EUS) RADIAL;  Surgeon: [name], MD;  [facility];  Service: Endoscopy;  Laterality: N/A;  . FINE NEEDLE ASPIRATION N/A 09/01/2016   Procedure: FINE NEEDLE ASPIRATION (FNA) RADIAL;  Surgeon: [name], MD;  [facility];  Service: Endoscopy;  Laterality: N/A;  . FOREIGN BODY REMOVAL N/A 09/01/2016   Procedure: FOREIGN BODY REMOVAL;  Surgeon: [name], MD;  [facility];  Service: Gastroenterology;  Laterality: N/A;  . KNEE SURGERY Right 2002   arthroscopy  . PORTA CATH INSERTION N/A 09/20/2017   Procedure: PORTA CATH INSERTION;  Surgeon: [name], MD;  Location: [facility];  Service: Cardiovascular;  Laterality: N/A;  . SHOULDER SURGERY Right 2002    FAMILY HISTORY: Family History  Problem Relation Age of Onset  . Heart disease Father     ADVANCED DIRECTIVES (Y/N):  N  HEALTH MAINTENANCE: Social History   Tobacco Use  . Smoking status: Current Every Day Smoker    Packs/day: 1.00    Years: 40.00    Pack years: 40.00    Types: Cigarettes  . Smokeless tobacco: Never Used  Substance Use Topics  . Alcohol use: Yes    Alcohol/week: 7.0 standard drinks    Types: 7 Cans of beer per week    Comment: last drink 5 weeeks ago  april 2018  . Drug use: No     Colonoscopy:  PAP:  Bone density:  Lipid panel:  Allergies  Allergen Reactions  . No Known Allergies     Current Outpatient Medications  Medication Sig Dispense Refill  . albuterol (VENTOLIN HFA) 108 (90 Base) MCG/ACT inhaler Inhale 1-2 puffs into the lungs every 6 (six) hours as needed for wheezing or shortness of breath. 1 Inhaler 6  . Ipratropium-Albuterol (COMBIVENT RESPIMAT) 20-100 MCG/ACT AERS respimat Inhale 1 puff into the lungs 4 (four) times daily. 1 g 3  . megestrol (MEGACE) 40 MG tablet Take 1 tablet (40 mg total) by mouth daily. 30 tablet 2  . Oxycodone HCl 10 MG TABS Take 1 tablet (10 mg total) by mouth 2 (two) times daily as needed. 60 tablet 0  . prochlorperazine (COMPAZINE) 10 MG tablet Take 1 tablet (10 mg total) by mouth every 6 (six) hours as needed for nausea or vomiting. 60 tablet 0  . traMADol (ULTRAM) 50 MG tablet Take 1 tablet (50 mg total) by  mouth every 6 (six) hours as needed. 30 tablet 0  . zolpidem (AMBIEN) 10 MG tablet Take 1 tablet (10 mg total) by mouth at bedtime as needed for up to 30 days for sleep. 30 tablet 0   No current facility-administered medications for this visit.    Facility-Administered Medications Ordered in Other Visits  Medication Dose Route Frequency Provider Last Rate Last Dose  . sodium chloride flush (NS) 0.9 % injection 10 mL  10 mL Intracatheter PRN Lloyd Huger, MD       OBJECTIVE: Vitals:   11/15/18 0910  BP: 126/74  Pulse: 78  Temp: (!) 97.1 F (36.2 C)     Body mass index is 15.46 kg/m.    ECOG FS:0 - Asymptomatic  General: Thin, no acute distress. Eyes: Pink conjunctiva, anicteric sclera. HEENT: Normocephalic, moist mucous membranes, clear oropharnyx. Lungs: Clear to auscultation bilaterally. Heart: Regular rate and rhythm. No rubs, murmurs, or gallops. Abdomen: Soft, nontender, nondistended. No organomegaly noted, normoactive bowel sounds. Musculoskeletal: No edema, cyanosis, or clubbing. Neuro: Alert, answering all questions appropriately. Cranial nerves grossly intact. Skin: No rashes or petechiae noted. Psych: Normal affect.  LAB RESULTS:  Lab Results  Component  Value Date   NA 132 (L) 11/15/2018   K 3.9 11/15/2018   CL 99 11/15/2018   CO2 26 11/15/2018   GLUCOSE 124 (H) 11/15/2018   BUN 9 11/15/2018   CREATININE 0.31 (L) 11/15/2018   CALCIUM 8.8 (L) 11/15/2018   PROT 6.7 11/15/2018   ALBUMIN 3.5 11/15/2018   AST 55 (H) 11/15/2018   ALT 54 (H) 11/15/2018   ALKPHOS 528 (H) 11/15/2018   BILITOT 0.5 11/15/2018   GFRNONAA >60 11/15/2018   GFRAA >60 11/15/2018    Lab Results  Component Value Date   WBC 6.9 11/15/2018   NEUTROABS 3.4 11/15/2018   HGB 12.5 (L) 11/15/2018   HCT 37.3 (L) 11/15/2018   MCV 96.4 11/15/2018   PLT 195 11/15/2018      STUDIES: No results found.  ASSESSMENT: Stage IV pancreatic cancer  PLAN:   1. Stage IV pancreatic  cancer: Imaging, pathology, and Op note reviewed independently confirming stage IV disease with multiple peritoneal implants.  Patient's initial diagnosis was on September 01, 2016.  CT scan results from September 11, 2018 reviewed independently with persistent disease in patient's pancreas, but no other findings suggestive of metastatic disease in the abdomen or pelvis.  CA 19-9 was decreased to 164.  Patient wishes to continue treatment, but expressed understanding that his treatment options are limited.  He has also expressed interest in enrolling in a clinical trial.  Patient previously stated he has no interest in hospice and if chemotherapy were not an option he would like to pursue surgery or ablation.  We discussed that this is likely not possible.  Proceed with cycle 7, day 8 of gemcitabine only today.  Return to clinic in 1 week for further evaluation and consideration of cycle 7, day 15.   2.  Elevated liver enzymes: Mild.  Gemcitabine does not need to be dose reduced and liver dysfunction.  Patient has not been evaluated by GI secondary to transportation issues.  Proceed with treatment as above. 3. Hyponatremia: Sodium is 132 today, monitor. 4. Peripheral edema: Continue elevation nightly.  Lower extremity ultrasound did not reveal DVT.   5.  Elevated bilirubin: Resolved. 6.  Pain: Chronic and unchanged.  Continue oxycodone as needed. 7.  Poor appetite: Patient has Megace prescribed, but is unclear his compliance.   8.  Shortness of breath: Chronic and unchanged.  Patient does not complain of this today.  Continue inhalers as prescribed. 9.  Shoulder pain/tremor: Patient does not complain of this today.  Patient was previously given a referral to orthopedics as he is received injections in the past that improved his symptoms. 10.  Insomnia: Improved with Ambien.  Continue to use as needed. 11.  Rash: Resolved.  Secondary gemcitabine.  Improved with Medrol Dosepak.  Continue premedications as ordered for  chemotherapy.  12.  Thrombocytopenia: Resolved.  I spent a total of 30 minutes face-to-face with the patient of which greater than 50% of the visit was spent in counseling and coordination of care as detailed above.   Patient expressed understanding and was in agreement with this plan. He also understands that He can call clinic at any time with any questions, concerns, or complaints.   Cancer Staging Pancreatic cancer [facility]) Staging form: Exocrine Pancreas, AJCC 8th Edition - Clinical stage from 09/25/2016: Stage IV (cTX, cNX, pM1) - Signed by Lloyd Huger, MD on 09/25/2016   Lloyd Huger, MD   11/15/2018 2:24 PM

## 2018-11-14 ENCOUNTER — Other Ambulatory Visit: Payer: Self-pay

## 2018-11-15 ENCOUNTER — Inpatient Hospital Stay: Payer: Medicaid Other

## 2018-11-15 ENCOUNTER — Inpatient Hospital Stay (HOSPITAL_BASED_OUTPATIENT_CLINIC_OR_DEPARTMENT_OTHER): Payer: Medicaid Other | Admitting: Oncology

## 2018-11-15 ENCOUNTER — Encounter: Payer: Self-pay | Admitting: Oncology

## 2018-11-15 ENCOUNTER — Other Ambulatory Visit: Payer: Self-pay

## 2018-11-15 VITALS — BP 126/74 | HR 78 | Temp 97.1°F | Ht 72.0 in | Wt 114.0 lb

## 2018-11-15 DIAGNOSIS — Z5111 Encounter for antineoplastic chemotherapy: Secondary | ICD-10-CM | POA: Diagnosis not present

## 2018-11-15 DIAGNOSIS — C259 Malignant neoplasm of pancreas, unspecified: Secondary | ICD-10-CM

## 2018-11-15 LAB — CBC WITH DIFFERENTIAL/PLATELET
Abs Immature Granulocytes: 0.05 10*3/uL (ref 0.00–0.07)
Basophils Absolute: 0.1 10*3/uL (ref 0.0–0.1)
Basophils Relative: 1 %
Eosinophils Absolute: 0.5 10*3/uL (ref 0.0–0.5)
Eosinophils Relative: 7 %
HCT: 37.3 % — ABNORMAL LOW (ref 39.0–52.0)
Hemoglobin: 12.5 g/dL — ABNORMAL LOW (ref 13.0–17.0)
Immature Granulocytes: 1 %
Lymphocytes Relative: 24 %
Lymphs Abs: 1.7 10*3/uL (ref 0.7–4.0)
MCH: 32.3 pg (ref 26.0–34.0)
MCHC: 33.5 g/dL (ref 30.0–36.0)
MCV: 96.4 fL (ref 80.0–100.0)
Monocytes Absolute: 1.2 10*3/uL — ABNORMAL HIGH (ref 0.1–1.0)
Monocytes Relative: 18 %
Neutro Abs: 3.4 10*3/uL (ref 1.7–7.7)
Neutrophils Relative %: 49 %
Platelets: 195 10*3/uL (ref 150–400)
RBC: 3.87 MIL/uL — ABNORMAL LOW (ref 4.22–5.81)
RDW: 20.6 % — ABNORMAL HIGH (ref 11.5–15.5)
WBC: 6.9 10*3/uL (ref 4.0–10.5)
nRBC: 0 % (ref 0.0–0.2)

## 2018-11-15 LAB — COMPREHENSIVE METABOLIC PANEL
ALT: 54 U/L — ABNORMAL HIGH (ref 0–44)
AST: 55 U/L — ABNORMAL HIGH (ref 15–41)
Albumin: 3.5 g/dL (ref 3.5–5.0)
Alkaline Phosphatase: 528 U/L — ABNORMAL HIGH (ref 38–126)
Anion gap: 7 (ref 5–15)
BUN: 9 mg/dL (ref 6–20)
CO2: 26 mmol/L (ref 22–32)
Calcium: 8.8 mg/dL — ABNORMAL LOW (ref 8.9–10.3)
Chloride: 99 mmol/L (ref 98–111)
Creatinine, Ser: 0.31 mg/dL — ABNORMAL LOW (ref 0.61–1.24)
GFR calc Af Amer: >60 mL/min (ref >60)
GFR calc non Af Amer: >60 mL/min (ref >60)
Glucose, Bld: 124 mg/dL — ABNORMAL HIGH (ref 70–99)
Potassium: 3.9 mmol/L (ref 3.5–5.1)
Sodium: 132 mmol/L — ABNORMAL LOW (ref 135–145)
Total Bilirubin: 0.5 mg/dL (ref 0.3–1.2)
Total Protein: 6.7 g/dL (ref 6.5–8.1)

## 2018-11-15 MED ORDER — COMBIVENT RESPIMAT 20-100 MCG/ACT IN AERS: 1.0000 | INHALATION_SPRAY | Freq: Four times a day (QID) | RESPIRATORY_TRACT | 3 refills | Status: AC

## 2018-11-15 MED ORDER — METHYLPREDNISOLONE SODIUM SUCC 125 MG IJ SOLR
40.0000 mg | Freq: Once | INTRAMUSCULAR | Status: AC
  Administered 2018-11-15: 40 mg via INTRAVENOUS
  Filled 2018-11-15: qty 2

## 2018-11-15 MED ORDER — FAMOTIDINE IN NACL 20-0.9 MG/50ML-% IV SOLN
20.0000 mg | Freq: Once | INTRAVENOUS | Status: AC
  Administered 2018-11-15: 20 mg via INTRAVENOUS
  Filled 2018-11-15: qty 50

## 2018-11-15 MED ORDER — DIPHENHYDRAMINE HCL 50 MG/ML IJ SOLN
25.0000 mg | Freq: Once | INTRAMUSCULAR | Status: AC
  Administered 2018-11-15: 25 mg via INTRAVENOUS
  Filled 2018-11-15: qty 1

## 2018-11-15 MED ORDER — SODIUM CHLORIDE 0.9% FLUSH
10.0000 mL | Freq: Once | INTRAVENOUS | Status: AC
  Administered 2018-11-15: 10 mL via INTRAVENOUS
  Filled 2018-11-15: qty 10

## 2018-11-15 MED ORDER — PROCHLORPERAZINE MALEATE 10 MG PO TABS
10.0000 mg | ORAL_TABLET | Freq: Once | ORAL | Status: AC
  Administered 2018-11-15: 10 mg via ORAL
  Filled 2018-11-15: qty 1

## 2018-11-15 MED ORDER — SODIUM CHLORIDE 0.9% FLUSH
10.0000 mL | INTRAVENOUS | Status: DC | PRN
  Filled 2018-11-15: qty 10

## 2018-11-15 MED ORDER — SODIUM CHLORIDE 0.9 % IV SOLN
1600.0000 mg | Freq: Once | INTRAVENOUS | Status: AC
  Administered 2018-11-15: 1600 mg via INTRAVENOUS
  Filled 2018-11-15: qty 26.3

## 2018-11-15 MED ORDER — HEPARIN SOD (PORK) LOCK FLUSH 100 UNIT/ML IV SOLN
500.0000 [IU] | Freq: Once | INTRAVENOUS | Status: AC | PRN
  Administered 2018-11-15: 500 [IU]
  Filled 2018-11-15: qty 5

## 2018-11-15 MED ORDER — SODIUM CHLORIDE 0.9 % IV SOLN
Freq: Once | INTRAVENOUS | Status: AC
  Administered 2018-11-15: 10:00:00 via INTRAVENOUS
  Filled 2018-11-15: qty 250

## 2018-11-15 NOTE — Progress Notes (Signed)
Patient stated that he had been doing well with no complaints. 

## 2018-11-16 LAB — CANCER ANTIGEN 19-9: CA 19-9: 206 U/mL — ABNORMAL HIGH (ref 0–35)

## 2018-11-17 NOTE — Progress Notes (Signed)
[place]  Telephone:([phone] [phone]  ID: Benjamin Diaz. OB: 12-Oct-1960  MR#: [id number]  PR:[id number]  Patient Care Team: Patient, [name] as PCP - General (General Practice) [name], MD as Consulting Physician (Oncology) [name], MD (Inactive) as Consulting Physician (Gastroenterology) [name], MD as Consulting Physician (Gastroenterology)  CHIEF COMPLAINT: Stage IV pancreatic cancer  INTERVAL HISTORY: Patient returns to clinic today for further evaluation and consideration of cycle 7, day 15 of single agent gemcitabine.  He continues to have right shoulder pain, but otherwise feels well.  He has chronic weakness and fatigue. He has a fair appetite.  He has no neurologic complaints.  He denies any recent fevers or illnesses.  He denies any chest pain, shortness of breath, cough, or hemoptysis.  He has no abdominal pain, but admits to occasional back pain.  He denies any nausea, vomiting, constipation, or diarrhea. He has no urinary complaints.  Patient offers no further specific complaints today.  REVIEW OF SYSTEMS:   Review of Systems  Constitutional: Positive for malaise/fatigue. Negative for fever and weight loss.  Respiratory: Negative.  Negative for cough and shortness of breath.   Cardiovascular: Negative.  Negative for chest pain and leg swelling.  Gastrointestinal: Negative.  Negative for abdominal pain, diarrhea, nausea and vomiting.  Genitourinary: Negative.  Negative for dysuria, flank pain, frequency and urgency.  Musculoskeletal: Positive for joint pain. Negative for back pain.  Skin: Negative.  Negative for itching and rash.  Neurological: Positive for weakness. Negative for tingling, tremors, sensory change and focal weakness.  Psychiatric/Behavioral: Negative.  The patient is not nervous/anxious and does not have insomnia.     As per HPI. Otherwise, a complete review of systems is negative.  PAST  MEDICAL HISTORY: Past Medical History:  Diagnosis Date  . Arthritis   . Asthma   . Atrial fibrillation ([place])   . Closed left ankle fracture age 89  . COPD (chronic obstructive pulmonary disease) ([place])   . Dysrhythmia   . Pancreatic cancer ([place]) 06/2016   Chemo tx's.   . Pneumonia 2011  . Right shoulder injury 09/27/2016  . Sciatica    left leg pinched nerve numb at times about once a year    PAST SURGICAL HISTORY: Past Surgical History:  Procedure Laterality Date  . BILIARY STENT PLACEMENT N/A 09/01/2016   Procedure: BILIARY STENT PLACEMENT;  Surgeon: [name], MD;  [facility];  Service: Endoscopy;  Laterality: N/A;  . DIAGNOSTIC LAPAROSCOPIC LIVER BIOPSY  09/16/2016   Procedure: DIAGNOSTIC LAPAROSCOPIC LIVER BIOPSY;  Surgeon: [name], MD;  Location: [facility];  Service: General;;  . ERCP N/A 07/09/2016   Procedure: ENDOSCOPIC RETROGRADE CHOLANGIOPANCREATOGRAPHY (ERCP);  Surgeon: [name], MD;  Location: [facility];  Service: Endoscopy;  Laterality: N/A;  . ERCP N/A 09/01/2016   Procedure: ENDOSCOPIC RETROGRADE CHOLANGIOPANCREATOGRAPHY (ERCP);  Surgeon: [name], MD;  Location: [facility];  Service: Endoscopy;  Laterality: N/A;  . ERCP N/A 06/28/2017   Procedure: ENDOSCOPIC RETROGRADE CHOLANGIOPANCREATOGRAPHY (ERCP);  Surgeon: [name], MD;  Location: [facility] ENDOSCOPY;  Service: Endoscopy;  Laterality: N/A;  . ESOPHAGOGASTRODUODENOSCOPY (EGD) WITH PROPOFOL N/A 09/01/2016   Procedure: ESOPHAGOGASTRODUODENOSCOPY (EGD) WITH PROPOFOL;  Surgeon: [name], MD;  [facility];  Service: Gastroenterology;  Laterality: N/A;  . EUS N/A 09/01/2016   Procedure: ESOPHAGEAL ENDOSCOPIC ULTRASOUND (EUS) RADIAL;  Surgeon: [name], MD;  [facility];  Service: Endoscopy;  Laterality: N/A;  . FINE NEEDLE ASPIRATION N/A 09/01/2016  Procedure: FINE NEEDLE ASPIRATION (FNA) RADIAL;  Surgeon: [name], MD;  [facility];  Service:  Endoscopy;  Laterality: N/A;  . FOREIGN BODY REMOVAL N/A 09/01/2016   Procedure: FOREIGN BODY REMOVAL;  Surgeon: [name], MD;  [facility];  Service: Gastroenterology;  Laterality: N/A;  . KNEE SURGERY Right 2002   arthroscopy  . PORTA CATH INSERTION N/A 09/20/2017   Procedure: PORTA CATH INSERTION;  Surgeon: [name], MD;  Location: [place] [facility];  Service: Cardiovascular;  Laterality: N/A;  . SHOULDER SURGERY Right 2002    FAMILY HISTORY: Family History  Problem Relation Age of Onset  . Heart disease Father     ADVANCED DIRECTIVES (Y/N):  N  HEALTH MAINTENANCE: Social History   Tobacco Use  . Smoking status: Current Every Day Smoker    Packs/day: 1.00    Years: 40.00    Pack years: 40.00    Types: Cigarettes  . Smokeless tobacco: Never Used  Substance Use Topics  . Alcohol use: Yes    Alcohol/week: 7.0 standard drinks    Types: 7 Cans of beer per week    Comment: last drink 5 weeeks ago  april 2018  . Drug use: No     Colonoscopy:  PAP:  Bone density:  Lipid panel:  Allergies  Allergen Reactions  . No Known Allergies     Current Outpatient Medications  Medication Sig Dispense Refill  . albuterol (VENTOLIN HFA) 108 (90 Base) MCG/ACT inhaler Inhale 1-2 puffs into the lungs every 6 (six) hours as needed for wheezing or shortness of breath. 1 Inhaler 6  . Ipratropium-Albuterol (COMBIVENT RESPIMAT) 20-100 MCG/ACT AERS respimat Inhale 1 puff into the lungs 4 (four) times daily. 1 g 3  . megestrol (MEGACE) 40 MG tablet Take 1 tablet (40 mg total) by mouth daily. 30 tablet 2  . Oxycodone HCl 10 MG TABS Take 1 tablet (10 mg total) by mouth 2 (two) times daily as needed. 60 tablet 0  . prochlorperazine (COMPAZINE) 10 MG tablet Take 1 tablet (10 mg total) by mouth every 6 (six) hours as needed for nausea or vomiting. 60 tablet 0  . traMADol (ULTRAM) 50 MG tablet Take 1 tablet (50 mg total) by mouth every 6 (six) hours as needed. 30 tablet 0  .  zolpidem (AMBIEN) 10 MG tablet Take 1 tablet (10 mg total) by mouth at bedtime as needed for up to 30 days for sleep. 30 tablet 0   No current facility-administered medications for this visit.    OBJECTIVE: Vitals:   11/21/18 0859  BP: 128/78  Pulse: 70  Resp: 18  Temp: 97.7 F (36.5 C)     Body mass index is 15.73 kg/m.    ECOG FS:0 - Asymptomatic  General: Thin, no acute distress. Eyes: Pink conjunctiva, anicteric sclera. HEENT: Normocephalic, moist mucous membranes, clear oropharnyx. Lungs: Clear to auscultation bilaterally. Heart: Regular rate and rhythm. No rubs, murmurs, or gallops. Abdomen: Soft, nontender, nondistended. No organomegaly noted, normoactive bowel sounds. Musculoskeletal: No edema, cyanosis, or clubbing. Neuro: Alert, answering all questions appropriately. Cranial nerves grossly intact. Skin: No rashes or petechiae noted. Psych: Normal affect.  LAB RESULTS:  Lab Results  Component Value Date   NA 134 (L) 11/21/2018   K 3.7 11/21/2018   CL 102 11/21/2018   CO2 24 11/21/2018   GLUCOSE 96 11/21/2018   BUN 8 11/21/2018   CREATININE 0.43 (L) 11/21/2018   CALCIUM 8.9 11/21/2018   PROT 6.5 11/21/2018  ALBUMIN 3.6 11/21/2018   AST 148 (H) 11/21/2018   ALT 129 (H) 11/21/2018   ALKPHOS 735 (H) 11/21/2018   BILITOT 1.2 11/21/2018   GFRNONAA >60 11/21/2018   GFRAA >60 11/21/2018    Lab Results  Component Value Date   WBC 9.8 11/21/2018   NEUTROABS 7.8 (H) 11/21/2018   HGB 12.6 (L) 11/21/2018   HCT 37.0 (L) 11/21/2018   MCV 95.6 11/21/2018   PLT 118 (L) 11/21/2018      STUDIES: No results found.  ASSESSMENT: Stage IV pancreatic cancer  PLAN:   1. Stage IV pancreatic cancer: Imaging, pathology, and Op note reviewed independently confirming stage IV disease with multiple peritoneal implants.  Patient's initial diagnosis was on September 01, 2016.  CT scan results from September 11, 2018 reviewed independently with persistent disease in patient's  pancreas, but no other findings suggestive of metastatic disease in the abdomen or CA 19-9 has trended up slightly to 206.  Patient wishes to continue treatment, but expressed understanding that his treatment options are limited.  He has also expressed interest in enrolling in a clinical trial.  Patient previously stated he has no interest in hospice and if chemotherapy were not an option he would like to pursue surgery or ablation.  We discussed that this is likely not possible.  Proceed with cycle 7, day 15 of gemcitabine only today.  Return to clinic in 2 weeks for further evaluation and consideration of cycle 8, day 1. 2.  Elevated liver enzymes: Slightly worse today.  Gemcitabine does not need to be dose reduced and liver dysfunction.  Patient has not been evaluated by GI secondary to transportation issues.  Proceed with treatment as above. 3. Hyponatremia: Sodium is 134 today.  Monitor. 4. Peripheral edema: Continue elevation nightly.  Lower extremity ultrasound did not reveal DVT.   5.  Elevated bilirubin: Resolved. 6.  Pain: Chronic and unchanged.  Continue oxycodone as needed. 7.  Poor appetite: Patient has Megace prescribed, but is unclear his compliance.   8.  Shortness of breath: Chronic and unchanged.  Patient does not complain of this today.  Continue inhalers as prescribed. 9.  Shoulder pain/tremor: Patient was given a referral back to orthopedics for initial evaluation. 10.  Insomnia: Improved with Ambien.  Continue to use as needed. 11.  Rash: Resolved.  Secondary gemcitabine.  Improved with Medrol Dosepak.  Continue premedications as ordered for chemotherapy.  12.  Thrombocytopenia: Mild, monitor.   Patient expressed understanding and was in agreement with this plan. He also understands that He can call clinic at any time with any questions, concerns, or complaints.   Cancer Staging Pancreatic cancer [facility]) Staging form: Exocrine Pancreas, AJCC 8th Edition - Clinical stage from  09/25/2016: Stage IV (cTX, cNX, pM1) - Signed by Lloyd Huger, MD on 09/25/2016   Lloyd Huger, MD   11/21/2018 4:15 PM

## 2018-11-21 ENCOUNTER — Inpatient Hospital Stay (HOSPITAL_BASED_OUTPATIENT_CLINIC_OR_DEPARTMENT_OTHER): Payer: Medicaid Other | Admitting: Oncology

## 2018-11-21 ENCOUNTER — Encounter: Payer: Self-pay | Admitting: Oncology

## 2018-11-21 ENCOUNTER — Inpatient Hospital Stay: Payer: Medicaid Other

## 2018-11-21 ENCOUNTER — Other Ambulatory Visit: Payer: Self-pay

## 2018-11-21 VITALS — BP 128/78 | HR 70 | Temp 97.7°F | Resp 18 | Wt 116.0 lb

## 2018-11-21 DIAGNOSIS — Z5111 Encounter for antineoplastic chemotherapy: Secondary | ICD-10-CM | POA: Diagnosis not present

## 2018-11-21 DIAGNOSIS — C259 Malignant neoplasm of pancreas, unspecified: Secondary | ICD-10-CM | POA: Diagnosis not present

## 2018-11-21 LAB — COMPREHENSIVE METABOLIC PANEL
ALT: 129 U/L — ABNORMAL HIGH (ref 0–44)
AST: 148 U/L — ABNORMAL HIGH (ref 15–41)
Albumin: 3.6 g/dL (ref 3.5–5.0)
Alkaline Phosphatase: 735 U/L — ABNORMAL HIGH (ref 38–126)
Anion gap: 8 (ref 5–15)
BUN: 8 mg/dL (ref 6–20)
CO2: 24 mmol/L (ref 22–32)
Calcium: 8.9 mg/dL (ref 8.9–10.3)
Chloride: 102 mmol/L (ref 98–111)
Creatinine, Ser: 0.43 mg/dL — ABNORMAL LOW (ref 0.61–1.24)
GFR calc Af Amer: >60 mL/min (ref >60)
GFR calc non Af Amer: >60 mL/min (ref >60)
Glucose, Bld: 96 mg/dL (ref 70–99)
Potassium: 3.7 mmol/L (ref 3.5–5.1)
Sodium: 134 mmol/L — ABNORMAL LOW (ref 135–145)
Total Bilirubin: 1.2 mg/dL (ref 0.3–1.2)
Total Protein: 6.5 g/dL (ref 6.5–8.1)

## 2018-11-21 LAB — CBC WITH DIFFERENTIAL/PLATELET
Abs Immature Granulocytes: 0.04 10*3/uL (ref 0.00–0.07)
Basophils Absolute: 0.1 10*3/uL (ref 0.0–0.1)
Basophils Relative: 1 %
Eosinophils Absolute: 0.2 10*3/uL (ref 0.0–0.5)
Eosinophils Relative: 2 %
HCT: 37 % — ABNORMAL LOW (ref 39.0–52.0)
Hemoglobin: 12.6 g/dL — ABNORMAL LOW (ref 13.0–17.0)
Immature Granulocytes: 0 %
Lymphocytes Relative: 12 %
Lymphs Abs: 1.2 10*3/uL (ref 0.7–4.0)
MCH: 32.6 pg (ref 26.0–34.0)
MCHC: 34.1 g/dL (ref 30.0–36.0)
MCV: 95.6 fL (ref 80.0–100.0)
Monocytes Absolute: 0.5 10*3/uL (ref 0.1–1.0)
Monocytes Relative: 5 %
Neutro Abs: 7.8 10*3/uL — ABNORMAL HIGH (ref 1.7–7.7)
Neutrophils Relative %: 80 %
Platelets: 118 10*3/uL — ABNORMAL LOW (ref 150–400)
RBC: 3.87 MIL/uL — ABNORMAL LOW (ref 4.22–5.81)
RDW: 19.9 % — ABNORMAL HIGH (ref 11.5–15.5)
WBC: 9.8 10*3/uL (ref 4.0–10.5)
nRBC: 0 % (ref 0.0–0.2)

## 2018-11-21 MED ORDER — SODIUM CHLORIDE 0.9 % IV SOLN
Freq: Once | INTRAVENOUS | Status: AC
  Administered 2018-11-21: 10:00:00 via INTRAVENOUS
  Filled 2018-11-21: qty 250

## 2018-11-21 MED ORDER — PROCHLORPERAZINE MALEATE 10 MG PO TABS
10.0000 mg | ORAL_TABLET | Freq: Once | ORAL | Status: AC
  Administered 2018-11-21: 10:00:00 10 mg via ORAL
  Filled 2018-11-21: qty 1

## 2018-11-21 MED ORDER — FAMOTIDINE IN NACL 20-0.9 MG/50ML-% IV SOLN
20.0000 mg | Freq: Once | INTRAVENOUS | Status: AC
  Administered 2018-11-21: 20 mg via INTRAVENOUS
  Filled 2018-11-21: qty 50

## 2018-11-21 MED ORDER — DIPHENHYDRAMINE HCL 50 MG/ML IJ SOLN
25.0000 mg | Freq: Once | INTRAMUSCULAR | Status: AC
  Administered 2018-11-21: 10:00:00 25 mg via INTRAVENOUS
  Filled 2018-11-21: qty 1

## 2018-11-21 MED ORDER — SODIUM CHLORIDE 0.9% FLUSH
10.0000 mL | INTRAVENOUS | Status: DC | PRN
  Administered 2018-11-21: 10 mL via INTRAVENOUS
  Filled 2018-11-21: qty 10

## 2018-11-21 MED ORDER — SODIUM CHLORIDE 0.9 % IV SOLN
Freq: Once | INTRAVENOUS | Status: DC
  Filled 2018-11-21: qty 250

## 2018-11-21 MED ORDER — HEPARIN SOD (PORK) LOCK FLUSH 100 UNIT/ML IV SOLN: 500.0000 [IU] | Freq: Once | INTRAVENOUS | Status: DC | PRN

## 2018-11-21 MED ORDER — SODIUM CHLORIDE 0.9 % IV SOLN
1600.0000 mg | Freq: Once | INTRAVENOUS | Status: AC
  Administered 2018-11-21: 11:00:00 1600 mg via INTRAVENOUS
  Filled 2018-11-21: qty 26.3

## 2018-11-21 MED ORDER — METHYLPREDNISOLONE SODIUM SUCC 125 MG IJ SOLR
40.0000 mg | Freq: Once | INTRAMUSCULAR | Status: AC
  Administered 2018-11-21: 40 mg via INTRAVENOUS
  Filled 2018-11-21: qty 2

## 2018-11-21 MED ORDER — HEPARIN SOD (PORK) LOCK FLUSH 100 UNIT/ML IV SOLN
500.0000 [IU] | Freq: Once | INTRAVENOUS | Status: AC
  Administered 2018-11-21: 11:00:00 500 [IU] via INTRAVENOUS
  Filled 2018-11-21: qty 5

## 2018-11-21 NOTE — Progress Notes (Signed)
Pt in for follow up, denies any concerns today. 

## 2018-12-01 NOTE — Progress Notes (Signed)
[name]  Telephone:([phone] [phone]  ID: Benjamin Diaz. OB: 1960-06-26  MR#: [id number]  [id number]  Patient Care Team: Patient, [name] as PCP - General (General Practice) [name], MD as Consulting Physician (Oncology) [name], MD (Inactive) as Consulting Physician (Gastroenterology) [name], MD as Consulting Physician (Gastroenterology)  CHIEF COMPLAINT: Stage IV pancreatic cancer  INTERVAL HISTORY: Patient returns to clinic today for further evaluation and consideration of cycle 8, day 1 of single agent gemcitabine.  He has noticed increasing back pain and abdominal pain over the last several weeks, but otherwise feels well.  He continues to have chronic right shoulder pain as well as chronic weakness and fatigue.  He has no neurologic complaints.  He denies any recent fevers or illnesses.  He denies any chest pain, shortness of breath, cough, or hemoptysis. He denies any nausea, vomiting, constipation, or diarrhea. He has no urinary complaints.  Patient offers no further specific complaints today.  REVIEW OF SYSTEMS:   Review of Systems  Constitutional: Positive for malaise/fatigue. Negative for fever and weight loss.  Respiratory: Negative.  Negative for cough and shortness of breath.   Cardiovascular: Negative.  Negative for chest pain and leg swelling.  Gastrointestinal: Positive for abdominal pain. Negative for diarrhea, nausea and vomiting.  Genitourinary: Negative.  Negative for dysuria, flank pain, frequency and urgency.  Musculoskeletal: Positive for back pain and joint pain.  Skin: Negative.  Negative for itching and rash.  Neurological: Positive for weakness. Negative for tingling, tremors, sensory change and focal weakness.  Psychiatric/Behavioral: Negative.  The patient is not nervous/anxious and does not have insomnia.     As per HPI. Otherwise, a complete review of systems is negative.  PAST  MEDICAL HISTORY: Past Medical History:  Diagnosis Date  . Arthritis   . Asthma   . Atrial fibrillation ([place])   . Closed left ankle fracture age 60  . COPD (chronic obstructive pulmonary disease) ([place])   . Dysrhythmia   . Pancreatic cancer ([place]) 06/2016   Chemo tx's.   . Pneumonia 2011  . Right shoulder injury 09/27/2016  . Sciatica    left leg pinched nerve numb at times about once a year    PAST SURGICAL HISTORY: Past Surgical History:  Procedure Laterality Date  . BILIARY STENT PLACEMENT N/A 09/01/2016   Procedure: BILIARY STENT PLACEMENT;  Surgeon: [name], MD;  Location: [facility];  Service: Endoscopy;  Laterality: N/A;  . DIAGNOSTIC LAPAROSCOPIC LIVER BIOPSY  09/16/2016   Procedure: DIAGNOSTIC LAPAROSCOPIC LIVER BIOPSY;  Surgeon: [name], MD;  Location: [facility];  Service: General;;  . ERCP N/A 07/09/2016   Procedure: ENDOSCOPIC RETROGRADE CHOLANGIOPANCREATOGRAPHY (ERCP);  Surgeon: [name], MD;  Location: [facility];  Service: Endoscopy;  Laterality: N/A;  . ERCP N/A 09/01/2016   Procedure: ENDOSCOPIC RETROGRADE CHOLANGIOPANCREATOGRAPHY (ERCP);  Surgeon: [name], MD;  Location: [facility];  Service: Endoscopy;  Laterality: N/A;  . ERCP N/A 06/28/2017   Procedure: ENDOSCOPIC RETROGRADE CHOLANGIOPANCREATOGRAPHY (ERCP);  Surgeon: [name], MD;  Location: [facility];  Service: Endoscopy;  Laterality: N/A;  . ESOPHAGOGASTRODUODENOSCOPY (EGD) WITH PROPOFOL N/A 09/01/2016   Procedure: ESOPHAGOGASTRODUODENOSCOPY (EGD) WITH PROPOFOL;  Surgeon: [name], MD;  [facility];  Service: Gastroenterology;  Laterality: N/A;  . EUS N/A 09/01/2016   Procedure: ESOPHAGEAL ENDOSCOPIC ULTRASOUND (EUS) RADIAL;  Surgeon: [name], MD;  [facility];  Service: Endoscopy;  Laterality: N/A;  . FINE NEEDLE ASPIRATION N/A 09/01/2016   Procedure: FINE  NEEDLE ASPIRATION (FNA) RADIAL;  Surgeon: [name], MD;  [facility];  Service:  Endoscopy;  Laterality: N/A;  . FOREIGN BODY REMOVAL N/A 09/01/2016   Procedure: FOREIGN BODY REMOVAL;  Surgeon: [name], MD;  [facility];  Service: Gastroenterology;  Laterality: N/A;  . KNEE SURGERY Right 2002   arthroscopy  . PORTA CATH INSERTION N/A 09/20/2017   Procedure: PORTA CATH INSERTION;  Surgeon: [name], MD;  Location: [place] [facility];  Service: Cardiovascular;  Laterality: N/A;  . SHOULDER SURGERY Right 2002    FAMILY HISTORY: Family History  Problem Relation Age of Onset  . Heart disease Father     ADVANCED DIRECTIVES (Y/N):  N  HEALTH MAINTENANCE: Social History   Tobacco Use  . Smoking status: Current Every Day Smoker    Packs/day: 1.00    Years: 40.00    Pack years: 40.00    Types: Cigarettes  . Smokeless tobacco: Never Used  Substance Use Topics  . Alcohol use: Yes    Alcohol/week: 7.0 standard drinks    Types: 7 Cans of beer per week    Comment: last drink 5 weeeks ago  april 2018  . Drug use: No     Colonoscopy:  PAP:  Bone density:  Lipid panel:  Allergies  Allergen Reactions  . No Known Allergies     Current Outpatient Medications  Medication Sig Dispense Refill  . albuterol (VENTOLIN HFA) 108 (90 Base) MCG/ACT inhaler Inhale 1-2 puffs into the lungs every 6 (six) hours as needed for wheezing or shortness of breath. 1 Inhaler 6  . Ipratropium-Albuterol (COMBIVENT RESPIMAT) 20-100 MCG/ACT AERS respimat Inhale 1 puff into the lungs 4 (four) times daily. 1 g 3  . megestrol (MEGACE) 40 MG tablet Take 1 tablet (40 mg total) by mouth daily. 30 tablet 2  . Oxycodone HCl 10 MG TABS Take 1 tablet (10 mg total) by mouth 2 (two) times daily as needed. 60 tablet 0  . prochlorperazine (COMPAZINE) 10 MG tablet Take 1 tablet (10 mg total) by mouth every 6 (six) hours as needed for nausea or vomiting. 60 tablet 0  . traMADol (ULTRAM) 50 MG tablet Take 1 tablet (50 mg total) by mouth every 6 (six) hours as needed. 30 tablet 0  .  zolpidem (AMBIEN) 10 MG tablet Take 1 tablet (10 mg total) by mouth at bedtime as needed for sleep. 30 tablet 0   No current facility-administered medications for this visit.    OBJECTIVE: Vitals:   12/05/18 1014  BP: 126/72  Pulse: 81  Resp: 16  Temp: (!) 96.1 F (35.6 C)  SpO2: 96%     Body mass index is 15.33 kg/m.    ECOG FS:0 - Asymptomatic  General: Thin, no acute distress. Eyes: Pink conjunctiva, anicteric sclera. HEENT: Normocephalic, moist mucous membranes. Lungs: Clear to auscultation bilaterally. Heart: Regular rate and rhythm. No rubs, murmurs, or gallops. Abdomen: Soft, nontender, nondistended. No organomegaly noted, normoactive bowel sounds. Musculoskeletal: No edema, cyanosis, or clubbing. Neuro: Alert, answering all questions appropriately. Cranial nerves grossly intact. Skin: No rashes or petechiae noted. Psych: Normal affect.  LAB RESULTS:  Lab Results  Component Value Date   NA 134 (L) 12/05/2018   K 3.5 12/05/2018   CL 102 12/05/2018   CO2 23 12/05/2018   GLUCOSE 110 (H) 12/05/2018   BUN 7 12/05/2018   CREATININE 0.41 (L) 12/05/2018   CALCIUM 8.8 (L) 12/05/2018   PROT 6.5 12/05/2018   ALBUMIN 3.6 12/05/2018  AST 51 (H) 12/05/2018   ALT 48 (H) 12/05/2018   ALKPHOS 835 (H) 12/05/2018   BILITOT 1.0 12/05/2018   GFRNONAA >60 12/05/2018   GFRAA >60 12/05/2018    Lab Results  Component Value Date   WBC 9.3 12/05/2018   NEUTROABS 6.1 12/05/2018   HGB 12.4 (L) 12/05/2018   HCT 36.4 (L) 12/05/2018   MCV 96.0 12/05/2018   PLT 381 12/05/2018      STUDIES: No results found.  ASSESSMENT: Stage IV pancreatic cancer  PLAN:   1. Stage IV pancreatic cancer: Imaging, pathology, and Op note reviewed independently confirming stage IV disease with multiple peritoneal implants.  Patient's initial diagnosis was on September 01, 2016.  CT scan results from September 11, 2018 reviewed independently with persistent disease in patient's pancreas, but no other  findings suggestive of metastatic disease in the abdomen or CA 19-9 has trended up slightly to 206.  Patient wishes to continue treatment, but expressed understanding that his treatment options are limited.  He has also expressed interest in enrolling in a clinical trial.  Patient previously stated he has no interest in hospice and if chemotherapy were not an option he would like to pursue surgery or ablation.  We discussed that this is likely not possible.  We discussed possibly repeating imaging now given his increased back and abdominal pain, but patient would like to proceed as planned with cycle 8, day 1 of treatment today.  Return to clinic in 1 week for further evaluation and consideration of cycle 8, day 8.  Will reimage at the conclusion of cycle 8.   2.  Elevated liver enzymes: Improved.  Gemcitabine does not need to be dose reduced and liver dysfunction.  Patient has not been evaluated by GI secondary to transportation issues.  Proceed with treatment as above. 3. Hyponatremia: Chronic and unchanged. 4. Peripheral edema: Continue elevation nightly.  Lower extremity ultrasound did not reveal DVT.   5.  Elevated bilirubin: Resolved. 6.  Pain: Slightly worse.  Patient was given a refill of his oxycodone today. 7.  Poor appetite: Patient has Megace prescribed, but is unclear his compliance.   8.  Shortness of breath: Chronic and unchanged.  Patient does not complain of this today.  Continue inhalers as prescribed. 9.  Shoulder pain/tremor: Patient was given a referral back to orthopedics for initial evaluation. 10.  Insomnia: Patient was given a refill of Ambien today.   11.  Rash: Resolved.  Secondary gemcitabine.  Improved with Medrol Dosepak.  Continue premedications as ordered for chemotherapy.  12.  Thrombocytopenia: Mild, monitor.   Patient expressed understanding and was in agreement with this plan. He also understands that He can call clinic at any time with any questions, concerns, or  complaints.   Cancer Staging Pancreatic cancer [facility]) Staging form: Exocrine Pancreas, AJCC 8th Edition - Clinical stage from 09/25/2016: Stage IV (cTX, cNX, pM1) - Signed by Lloyd Huger, MD on 09/25/2016   Lloyd Huger, MD   12/05/2018 2:15 PM

## 2018-12-05 ENCOUNTER — Other Ambulatory Visit: Payer: Self-pay

## 2018-12-05 ENCOUNTER — Inpatient Hospital Stay: Payer: Medicaid Other | Attending: Oncology

## 2018-12-05 ENCOUNTER — Inpatient Hospital Stay: Payer: Medicaid Other

## 2018-12-05 ENCOUNTER — Encounter: Payer: Self-pay | Admitting: Oncology

## 2018-12-05 ENCOUNTER — Inpatient Hospital Stay (HOSPITAL_BASED_OUTPATIENT_CLINIC_OR_DEPARTMENT_OTHER): Payer: Medicaid Other | Admitting: Oncology

## 2018-12-05 VITALS — BP 126/72 | HR 81 | Temp 96.1°F | Resp 16 | Wt 113.0 lb

## 2018-12-05 DIAGNOSIS — R5383 Other fatigue: Secondary | ICD-10-CM | POA: Insufficient documentation

## 2018-12-05 DIAGNOSIS — F17210 Nicotine dependence, cigarettes, uncomplicated: Secondary | ICD-10-CM | POA: Insufficient documentation

## 2018-12-05 DIAGNOSIS — R748 Abnormal levels of other serum enzymes: Secondary | ICD-10-CM | POA: Insufficient documentation

## 2018-12-05 DIAGNOSIS — C259 Malignant neoplasm of pancreas, unspecified: Secondary | ICD-10-CM

## 2018-12-05 DIAGNOSIS — M25511 Pain in right shoulder: Secondary | ICD-10-CM | POA: Insufficient documentation

## 2018-12-05 DIAGNOSIS — D696 Thrombocytopenia, unspecified: Secondary | ICD-10-CM | POA: Insufficient documentation

## 2018-12-05 DIAGNOSIS — G8929 Other chronic pain: Secondary | ICD-10-CM | POA: Diagnosis not present

## 2018-12-05 DIAGNOSIS — E871 Hypo-osmolality and hyponatremia: Secondary | ICD-10-CM | POA: Diagnosis not present

## 2018-12-05 DIAGNOSIS — I4891 Unspecified atrial fibrillation: Secondary | ICD-10-CM | POA: Insufficient documentation

## 2018-12-05 DIAGNOSIS — Z5111 Encounter for antineoplastic chemotherapy: Secondary | ICD-10-CM | POA: Insufficient documentation

## 2018-12-05 DIAGNOSIS — R109 Unspecified abdominal pain: Secondary | ICD-10-CM | POA: Insufficient documentation

## 2018-12-05 LAB — COMPREHENSIVE METABOLIC PANEL
ALT: 48 U/L — ABNORMAL HIGH (ref 0–44)
AST: 51 U/L — ABNORMAL HIGH (ref 15–41)
Albumin: 3.6 g/dL (ref 3.5–5.0)
Alkaline Phosphatase: 835 U/L — ABNORMAL HIGH (ref 38–126)
Anion gap: 9 (ref 5–15)
BUN: 7 mg/dL (ref 6–20)
CO2: 23 mmol/L (ref 22–32)
Calcium: 8.8 mg/dL — ABNORMAL LOW (ref 8.9–10.3)
Chloride: 102 mmol/L (ref 98–111)
Creatinine, Ser: 0.41 mg/dL — ABNORMAL LOW (ref 0.61–1.24)
GFR calc Af Amer: >60 mL/min (ref >60)
GFR calc non Af Amer: >60 mL/min (ref >60)
Glucose, Bld: 110 mg/dL — ABNORMAL HIGH (ref 70–99)
Potassium: 3.5 mmol/L (ref 3.5–5.1)
Sodium: 134 mmol/L — ABNORMAL LOW (ref 135–145)
Total Bilirubin: 1 mg/dL (ref 0.3–1.2)
Total Protein: 6.5 g/dL (ref 6.5–8.1)

## 2018-12-05 LAB — CBC WITH DIFFERENTIAL/PLATELET
Abs Immature Granulocytes: 0.04 10*3/uL (ref 0.00–0.07)
Basophils Absolute: 0.1 10*3/uL (ref 0.0–0.1)
Basophils Relative: 1 %
Eosinophils Absolute: 0.3 10*3/uL (ref 0.0–0.5)
Eosinophils Relative: 4 %
HCT: 36.4 % — ABNORMAL LOW (ref 39.0–52.0)
Hemoglobin: 12.4 g/dL — ABNORMAL LOW (ref 13.0–17.0)
Immature Granulocytes: 0 %
Lymphocytes Relative: 14 %
Lymphs Abs: 1.3 10*3/uL (ref 0.7–4.0)
MCH: 32.7 pg (ref 26.0–34.0)
MCHC: 34.1 g/dL (ref 30.0–36.0)
MCV: 96 fL (ref 80.0–100.0)
Monocytes Absolute: 1.5 10*3/uL — ABNORMAL HIGH (ref 0.1–1.0)
Monocytes Relative: 16 %
Neutro Abs: 6.1 10*3/uL (ref 1.7–7.7)
Neutrophils Relative %: 65 %
Platelets: 381 10*3/uL (ref 150–400)
RBC: 3.79 MIL/uL — ABNORMAL LOW (ref 4.22–5.81)
RDW: 20 % — ABNORMAL HIGH (ref 11.5–15.5)
WBC: 9.3 10*3/uL (ref 4.0–10.5)
nRBC: 0 % (ref 0.0–0.2)

## 2018-12-05 MED ORDER — HEPARIN SOD (PORK) LOCK FLUSH 100 UNIT/ML IV SOLN
500.0000 [IU] | Freq: Once | INTRAVENOUS | Status: AC | PRN
  Administered 2018-12-05: 500 [IU]
  Filled 2018-12-05: qty 5

## 2018-12-05 MED ORDER — DIPHENHYDRAMINE HCL 50 MG/ML IJ SOLN
25.0000 mg | Freq: Once | INTRAMUSCULAR | Status: AC
  Administered 2018-12-05: 11:00:00 25 mg via INTRAVENOUS
  Filled 2018-12-05: qty 1

## 2018-12-05 MED ORDER — FAMOTIDINE IN NACL 20-0.9 MG/50ML-% IV SOLN
20.0000 mg | Freq: Once | INTRAVENOUS | Status: AC
  Administered 2018-12-05: 20 mg via INTRAVENOUS
  Filled 2018-12-05: qty 50

## 2018-12-05 MED ORDER — SODIUM CHLORIDE 0.9 % IV SOLN
Freq: Once | INTRAVENOUS | Status: AC
  Administered 2018-12-05: 11:00:00 via INTRAVENOUS
  Filled 2018-12-05: qty 250

## 2018-12-05 MED ORDER — SODIUM CHLORIDE 0.9% FLUSH
10.0000 mL | Freq: Once | INTRAVENOUS | Status: AC
  Administered 2018-12-05: 09:00:00 10 mL via INTRAVENOUS
  Filled 2018-12-05: qty 10

## 2018-12-05 MED ORDER — ZOLPIDEM TARTRATE 10 MG PO TABS: 10.0000 mg | ORAL_TABLET | Freq: Every evening | ORAL | 0 refills | Status: DC | PRN

## 2018-12-05 MED ORDER — PROCHLORPERAZINE MALEATE 10 MG PO TABS
10.0000 mg | ORAL_TABLET | Freq: Once | ORAL | Status: AC
  Administered 2018-12-05: 10 mg via ORAL
  Filled 2018-12-05: qty 1

## 2018-12-05 MED ORDER — SODIUM CHLORIDE 0.9 % IV SOLN
1600.0000 mg | Freq: Once | INTRAVENOUS | Status: AC
  Administered 2018-12-05: 12:00:00 1600 mg via INTRAVENOUS
  Filled 2018-12-05: qty 26.3

## 2018-12-05 MED ORDER — OXYCODONE HCL 10 MG PO TABS: 10.0000 mg | ORAL_TABLET | Freq: Two times a day (BID) | ORAL | 0 refills | Status: AC | PRN

## 2018-12-05 MED ORDER — METHYLPREDNISOLONE SODIUM SUCC 125 MG IJ SOLR
40.0000 mg | Freq: Once | INTRAMUSCULAR | Status: AC
  Administered 2018-12-05: 40 mg via INTRAVENOUS
  Filled 2018-12-05: qty 2

## 2018-12-05 NOTE — Progress Notes (Signed)
Patient here today for follow up and possible chemotherapy.   Patient c/o mid back & abdominal pain at 5/10 today.

## 2018-12-06 LAB — CANCER ANTIGEN 19-9: CA 19-9: 233 U/mL — ABNORMAL HIGH (ref 0–35)

## 2018-12-12 ENCOUNTER — Inpatient Hospital Stay: Payer: Medicaid Other

## 2018-12-12 ENCOUNTER — Inpatient Hospital Stay (HOSPITAL_BASED_OUTPATIENT_CLINIC_OR_DEPARTMENT_OTHER): Payer: Medicaid Other | Admitting: Oncology

## 2018-12-12 ENCOUNTER — Other Ambulatory Visit: Payer: Self-pay

## 2018-12-12 ENCOUNTER — Encounter: Payer: Self-pay | Admitting: Oncology

## 2018-12-12 VITALS — BP 135/88 | HR 78 | Temp 95.9°F | Resp 16 | Wt 112.0 lb

## 2018-12-12 DIAGNOSIS — Z5111 Encounter for antineoplastic chemotherapy: Secondary | ICD-10-CM | POA: Diagnosis not present

## 2018-12-12 DIAGNOSIS — C259 Malignant neoplasm of pancreas, unspecified: Secondary | ICD-10-CM

## 2018-12-12 DIAGNOSIS — Z95828 Presence of other vascular implants and grafts: Secondary | ICD-10-CM

## 2018-12-12 LAB — CBC WITH DIFFERENTIAL/PLATELET
Abs Immature Granulocytes: 0.03 10*3/uL (ref 0.00–0.07)
Basophils Absolute: 0.1 10*3/uL (ref 0.0–0.1)
Basophils Relative: 2 %
Eosinophils Absolute: 0.1 10*3/uL (ref 0.0–0.5)
Eosinophils Relative: 2 %
HCT: 39 % (ref 39.0–52.0)
Hemoglobin: 13.3 g/dL (ref 13.0–17.0)
Immature Granulocytes: 1 %
Lymphocytes Relative: 21 %
Lymphs Abs: 1.2 10*3/uL (ref 0.7–4.0)
MCH: 32.4 pg (ref 26.0–34.0)
MCHC: 34.1 g/dL (ref 30.0–36.0)
MCV: 95.1 fL (ref 80.0–100.0)
Monocytes Absolute: 1.7 10*3/uL — ABNORMAL HIGH (ref 0.1–1.0)
Monocytes Relative: 30 %
Neutro Abs: 2.7 10*3/uL (ref 1.7–7.7)
Neutrophils Relative %: 44 %
Platelets: 293 10*3/uL (ref 150–400)
RBC: 4.1 MIL/uL — ABNORMAL LOW (ref 4.22–5.81)
RDW: 19.2 % — ABNORMAL HIGH (ref 11.5–15.5)
WBC: 5.8 10*3/uL (ref 4.0–10.5)
nRBC: 0 % (ref 0.0–0.2)

## 2018-12-12 LAB — COMPREHENSIVE METABOLIC PANEL
ALT: 71 U/L — ABNORMAL HIGH (ref 0–44)
AST: 94 U/L — ABNORMAL HIGH (ref 15–41)
Albumin: 3.7 g/dL (ref 3.5–5.0)
Alkaline Phosphatase: 988 U/L — ABNORMAL HIGH (ref 38–126)
Anion gap: 9 (ref 5–15)
BUN: 8 mg/dL (ref 6–20)
CO2: 24 mmol/L (ref 22–32)
Calcium: 8.9 mg/dL (ref 8.9–10.3)
Chloride: 96 mmol/L — ABNORMAL LOW (ref 98–111)
Creatinine, Ser: 0.37 mg/dL — ABNORMAL LOW (ref 0.61–1.24)
GFR calc Af Amer: >60 mL/min (ref >60)
GFR calc non Af Amer: >60 mL/min (ref >60)
Glucose, Bld: 127 mg/dL — ABNORMAL HIGH (ref 70–99)
Potassium: 3.8 mmol/L (ref 3.5–5.1)
Sodium: 129 mmol/L — ABNORMAL LOW (ref 135–145)
Total Bilirubin: 1.1 mg/dL (ref 0.3–1.2)
Total Protein: 6.9 g/dL (ref 6.5–8.1)

## 2018-12-12 MED ORDER — PROCHLORPERAZINE MALEATE 10 MG PO TABS
10.0000 mg | ORAL_TABLET | Freq: Once | ORAL | Status: AC
  Administered 2018-12-12: 10:00:00 10 mg via ORAL
  Filled 2018-12-12: qty 1

## 2018-12-12 MED ORDER — SODIUM CHLORIDE 0.9 % IV SOLN
Freq: Once | INTRAVENOUS | Status: AC
  Administered 2018-12-12: 09:00:00 via INTRAVENOUS
  Filled 2018-12-12: qty 250

## 2018-12-12 MED ORDER — DIPHENHYDRAMINE HCL 50 MG/ML IJ SOLN
25.0000 mg | Freq: Once | INTRAMUSCULAR | Status: AC
  Administered 2018-12-12: 10:00:00 25 mg via INTRAVENOUS
  Filled 2018-12-12: qty 1

## 2018-12-12 MED ORDER — HEPARIN SOD (PORK) LOCK FLUSH 100 UNIT/ML IV SOLN
500.0000 [IU] | Freq: Once | INTRAVENOUS | Status: AC
  Administered 2018-12-12: 500 [IU] via INTRAVENOUS

## 2018-12-12 MED ORDER — METHYLPREDNISOLONE SODIUM SUCC 125 MG IJ SOLR
40.0000 mg | Freq: Once | INTRAMUSCULAR | Status: AC
  Administered 2018-12-12: 10:00:00 40 mg via INTRAVENOUS
  Filled 2018-12-12: qty 2

## 2018-12-12 MED ORDER — SODIUM CHLORIDE 0.9 % IV SOLN
1600.0000 mg | Freq: Once | INTRAVENOUS | Status: AC
  Administered 2018-12-12: 10:00:00 1600 mg via INTRAVENOUS
  Filled 2018-12-12: qty 26.3

## 2018-12-12 MED ORDER — SODIUM CHLORIDE 0.9% FLUSH
10.0000 mL | Freq: Once | INTRAVENOUS | Status: AC
  Administered 2018-12-12: 09:00:00 10 mL via INTRAVENOUS
  Filled 2018-12-12: qty 10

## 2018-12-12 MED ORDER — FAMOTIDINE IN NACL 20-0.9 MG/50ML-% IV SOLN
20.0000 mg | Freq: Once | INTRAVENOUS | Status: AC
  Administered 2018-12-12: 10:00:00 20 mg via INTRAVENOUS
  Filled 2018-12-12: qty 50

## 2018-12-12 NOTE — Progress Notes (Signed)
Pt in for follow up, continues to have no appetite and some abdominal pain.

## 2018-12-12 NOTE — Progress Notes (Signed)
[place]  Telephone:([phone] [phone]  ID: Benjamin Diaz. OB: [birth year]  MR#: [id number]  [id number]  Patient Care Team: Patient, [name] as PCP - General (General Practice) [name], MD as Consulting Physician (Oncology) [name], MD (Inactive) as Consulting Physician (Gastroenterology) [name], MD as Consulting Physician (Gastroenterology)  CHIEF COMPLAINT: Stage IV pancreatic cancer  INTERVAL HISTORY: Patient returns to clinic today for further evaluation and consideration of cycle 8, day 8 of single agent gemcitabine.  He continues to have back and abdominal pain, that is unchanged.  He otherwise feels well.  He continues to have chronic right shoulder pain as well as chronic weakness and fatigue.  He has no neurologic complaints.  He denies any recent fevers or illnesses.  He denies any chest pain, shortness of breath, cough, or hemoptysis. He denies any nausea, vomiting, constipation, or diarrhea. He has no urinary complaints.  Patient offers no further specific complaints today.  REVIEW OF SYSTEMS:   Review of Systems  Constitutional: Positive for malaise/fatigue. Negative for fever and weight loss.  Respiratory: Negative.  Negative for cough and shortness of breath.   Cardiovascular: Negative.  Negative for chest pain and leg swelling.  Gastrointestinal: Positive for abdominal pain. Negative for diarrhea, nausea and vomiting.  Genitourinary: Negative.  Negative for dysuria, flank pain, frequency and urgency.  Musculoskeletal: Positive for back pain and joint pain.  Skin: Negative.  Negative for itching and rash.  Neurological: Positive for weakness. Negative for tingling, tremors, sensory change and focal weakness.  Psychiatric/Behavioral: Negative.  The patient is not nervous/anxious and does not have insomnia.     As per HPI. Otherwise, a complete review of systems is negative.  PAST MEDICAL HISTORY: Past  Medical History:  Diagnosis Date  . Arthritis   . Asthma   . Atrial fibrillation ([place])   . Closed left ankle fracture age 59  . COPD (chronic obstructive pulmonary disease) ([place])   . Dysrhythmia   . Pancreatic cancer ([place]) 06/2016   Chemo tx's.   . Pneumonia 2011  . Right shoulder injury 09/27/2016  . Sciatica    left leg pinched nerve numb at times about once a year    PAST SURGICAL HISTORY: Past Surgical History:  Procedure Laterality Date  . BILIARY STENT PLACEMENT N/A 09/01/2016   Procedure: BILIARY STENT PLACEMENT;  Surgeon: [name], MD;  [facility];  Service: Endoscopy;  Laterality: N/A;  . DIAGNOSTIC LAPAROSCOPIC LIVER BIOPSY  09/16/2016   Procedure: DIAGNOSTIC LAPAROSCOPIC LIVER BIOPSY;  Surgeon: [name], MD;  Location: [place];  Service: General;;  . ERCP N/A 07/09/2016   Procedure: ENDOSCOPIC RETROGRADE CHOLANGIOPANCREATOGRAPHY (ERCP);  Surgeon: [name], MD;  Location: [facility];  Service: Endoscopy;  Laterality: N/A;  . ERCP N/A 09/01/2016   Procedure: ENDOSCOPIC RETROGRADE CHOLANGIOPANCREATOGRAPHY (ERCP);  Surgeon: [name], MD;  Location: [facility];  Service: Endoscopy;  Laterality: N/A;  . ERCP N/A 06/28/2017   Procedure: ENDOSCOPIC RETROGRADE CHOLANGIOPANCREATOGRAPHY (ERCP);  Surgeon: [name], MD;  Location: [facility];  Service: Endoscopy;  Laterality: N/A;  . ESOPHAGOGASTRODUODENOSCOPY (EGD) WITH PROPOFOL N/A 09/01/2016   Procedure: ESOPHAGOGASTRODUODENOSCOPY (EGD) WITH PROPOFOL;  Surgeon: [name], MD;  [facility];  Service: Gastroenterology;  Laterality: N/A;  . EUS N/A 09/01/2016   Procedure: ESOPHAGEAL ENDOSCOPIC ULTRASOUND (EUS) RADIAL;  Surgeon: [name], MD;  [facility];  Service: Endoscopy;  Laterality: N/A;  . FINE NEEDLE ASPIRATION N/A 09/01/2016   Procedure: FINE NEEDLE ASPIRATION (  FNA) RADIAL;  Surgeon: [name], MD;  [facility];  Service: Endoscopy;  Laterality: N/A;   . FOREIGN BODY REMOVAL N/A 09/01/2016   Procedure: FOREIGN BODY REMOVAL;  Surgeon: [name], MD;  [facility];  Service: Gastroenterology;  Laterality: N/A;  . KNEE SURGERY Right 2002   arthroscopy  . PORTA CATH INSERTION N/A 09/20/2017   Procedure: PORTA CATH INSERTION;  Surgeon: [name], MD;  Location: [place] [facility];  Service: Cardiovascular;  Laterality: N/A;  . SHOULDER SURGERY Right 2002    FAMILY HISTORY: Family History  Problem Relation Age of Onset  . Heart disease Father     ADVANCED DIRECTIVES (Y/N):  N  HEALTH MAINTENANCE: Social History   Tobacco Use  . Smoking status: Current Every Day Smoker    Packs/day: 1.00    Years: 40.00    Pack years: 40.00    Types: Cigarettes  . Smokeless tobacco: Never Used  Substance Use Topics  . Alcohol use: Yes    Alcohol/week: 7.0 standard drinks    Types: 7 Cans of beer per week    Comment: last drink 5 weeeks ago  april 2018  . Drug use: No     Colonoscopy:  PAP:  Bone density:  Lipid panel:  Allergies  Allergen Reactions  . No Known Allergies     Current Outpatient Medications  Medication Sig Dispense Refill  . albuterol (VENTOLIN HFA) 108 (90 Base) MCG/ACT inhaler Inhale 1-2 puffs into the lungs every 6 (six) hours as needed for wheezing or shortness of breath. 1 Inhaler 6  . Ipratropium-Albuterol (COMBIVENT RESPIMAT) 20-100 MCG/ACT AERS respimat Inhale 1 puff into the lungs 4 (four) times daily. 1 g 3  . megestrol (MEGACE) 40 MG tablet Take 1 tablet (40 mg total) by mouth daily. 30 tablet 2  . Oxycodone HCl 10 MG TABS Take 1 tablet (10 mg total) by mouth 2 (two) times daily as needed. 60 tablet 0  . prochlorperazine (COMPAZINE) 10 MG tablet Take 1 tablet (10 mg total) by mouth every 6 (six) hours as needed for nausea or vomiting. 60 tablet 0  . traMADol (ULTRAM) 50 MG tablet Take 1 tablet (50 mg total) by mouth every 6 (six) hours as needed. 30 tablet 0  . zolpidem (AMBIEN) 10 MG  tablet Take 1 tablet (10 mg total) by mouth at bedtime as needed for sleep. 30 tablet 0   No current facility-administered medications for this visit.    OBJECTIVE: Vitals:   12/12/18 0856  BP: 135/88  Pulse: 78  Resp: 16  Temp: (!) 95.9 F (35.5 C)     Body mass index is 15.19 kg/m.    ECOG FS:0 - Asymptomatic  General: Thin, no acute distress. Eyes: Pink conjunctiva, anicteric sclera. HEENT: Normocephalic, moist mucous membranes, clear oropharnyx. Lungs: Clear to auscultation bilaterally. Heart: Regular rate and rhythm. No rubs, murmurs, or gallops. Abdomen: Soft, nontender, nondistended. No organomegaly noted, normoactive bowel sounds. Musculoskeletal: No edema, cyanosis, or clubbing. Neuro: Alert, answering all questions appropriately. Cranial nerves grossly intact. Skin: No rashes or petechiae noted. Psych: Normal affect.  LAB RESULTS:  Lab Results  Component Value Date   NA 129 (L) 12/12/2018   K 3.8 12/12/2018   CL 96 (L) 12/12/2018   CO2 24 12/12/2018   GLUCOSE 127 (H) 12/12/2018   BUN 8 12/12/2018   CREATININE 0.37 (L) 12/12/2018   CALCIUM 8.9 12/12/2018   PROT 6.9 12/12/2018   ALBUMIN 3.7 12/12/2018   AST  94 (H) 12/12/2018   ALT 71 (H) 12/12/2018   ALKPHOS 988 (H) 12/12/2018   BILITOT 1.1 12/12/2018   GFRNONAA >60 12/12/2018   GFRAA >60 12/12/2018    Lab Results  Component Value Date   WBC 5.8 12/12/2018   NEUTROABS 2.7 12/12/2018   HGB 13.3 12/12/2018   HCT 39.0 12/12/2018   MCV 95.1 12/12/2018   PLT 293 12/12/2018      STUDIES: No results found.  ASSESSMENT: Stage IV pancreatic cancer  PLAN:   1. Stage IV pancreatic cancer: Imaging, pathology, and Op note reviewed independently confirming stage IV disease with multiple peritoneal implants.  Patient's initial diagnosis was on September 01, 2016.  CT scan results from September 11, 2018 reviewed independently with persistent disease in patient's pancreas, but no other findings suggestive of metastatic  disease in the abdomen or CA 19-9 has trended up slightly to 206.  Patient wishes to continue treatment, but expressed understanding that his treatment options are limited.  He has also expressed interest in enrolling in a clinical trial.  Patient previously stated he has no interest in hospice and if chemotherapy were not an option he would like to pursue surgery or ablation.  We discussed that this is likely not possible.  We discussed possibly repeating imaging now given his increased back and abdominal pain, but patient would like to wait until the conclusion of cycle 8.  Proceed with cycle 8, day 8 of treatment today.  Return to clinic in 1 week for further evaluation and consideration of cycle 8, day 15.  2.  Elevated liver enzymes: Slightly worse today.  Gemcitabine does not need to be dose reduced and liver dysfunction.  Patient has not been evaluated by GI secondary to transportation issues.  Proceed with treatment as above. 3. Hyponatremia: Chronic and unchanged.  Patient's sodium is 129 today. 4. Peripheral edema: Continue elevation nightly.  Lower extremity ultrasound did not reveal DVT.   5.  Elevated bilirubin: Resolved. 6.  Pain: Slightly worse.  Continue oxycodone as needed.  Reimage at the conclusion of cycle 8. 7.  Poor appetite: Patient has Megace prescribed, but is unclear his compliance.   8.  Shortness of breath: Chronic and unchanged.  Patient does not complain of this today.  Continue inhalers as prescribed. 9.  Shoulder pain/tremor: Patient states he never heard from orthopedics and a referral was placed again for evaluation. 10.  Insomnia: Continue Ambien as needed. 11.  Rash: Resolved.  Secondary gemcitabine.  Improved with Medrol Dosepak.  Continue premedications as ordered for chemotherapy.  12.  Thrombocytopenia: Resolved.   Patient expressed understanding and was in agreement with this plan. He also understands that He can call clinic at any time with any questions,  concerns, or complaints.   Cancer Staging Pancreatic cancer [facility]) Staging form: Exocrine Pancreas, AJCC 8th Edition - Clinical stage from 09/25/2016: Stage IV (cTX, cNX, pM1) - Signed by Lloyd Huger, MD on 09/25/2016   Lloyd Huger, MD   12/12/2018 11:50 AM

## 2018-12-13 ENCOUNTER — Other Ambulatory Visit: Payer: Self-pay | Admitting: *Deleted

## 2018-12-13 DIAGNOSIS — M25511 Pain in right shoulder: Secondary | ICD-10-CM

## 2018-12-14 NOTE — Progress Notes (Signed)
[place]  Telephone:([phone] Fax:([phone]  ID: Benjamin Diaz. OB: [birth year]  MR#: [id number]  [id number]  Patient Care Team: Patient, [name] as PCP - General (General Practice) [name], MD as Consulting Physician (Oncology) [name], MD (Inactive) as Consulting Physician (Gastroenterology) [name], MD as Consulting Physician (Gastroenterology)  CHIEF COMPLAINT: Stage IV pancreatic cancer  INTERVAL HISTORY: Patient returns to clinic today for further evaluation and consideration of cycle 8, day 15 of single agent gemcitabine.  He continues to have back pain which she attributes to an old mattress.  He does not complain of abdominal pain today.   He continues to have chronic right shoulder pain and has an appointment with orthopedics tomorrow.  He continues to have chronic weakness and fatigue.  He has no neurologic complaints.  He denies any recent fevers or illnesses.  He denies any chest pain, shortness of breath, cough, or hemoptysis. He denies any nausea, vomiting, constipation, or diarrhea. He has no urinary complaints.  Patient offers no further specific complaints today.  REVIEW OF SYSTEMS:   Review of Systems  Constitutional: Positive for malaise/fatigue. Negative for fever and weight loss.  Respiratory: Negative.  Negative for cough and shortness of breath.   Cardiovascular: Negative.  Negative for chest pain and leg swelling.  Gastrointestinal: Positive for abdominal pain. Negative for diarrhea, nausea and vomiting.  Genitourinary: Negative.  Negative for dysuria, flank pain, frequency and urgency.  Musculoskeletal: Positive for back pain and joint pain.  Skin: Negative.  Negative for itching and rash.  Neurological: Positive for weakness. Negative for tingling, tremors, sensory change and focal weakness.  Psychiatric/Behavioral: Negative.  The patient is not nervous/anxious and does not have insomnia.     As  per HPI. Otherwise, a complete review of systems is negative.  PAST MEDICAL HISTORY: Past Medical History:  Diagnosis Date  . Arthritis   . Asthma   . Atrial fibrillation ([place])   . Closed left ankle fracture age 89  . COPD (chronic obstructive pulmonary disease) ([place])   . Dysrhythmia   . Pancreatic cancer ([place]) 06/2016   Chemo tx's.   . Pneumonia 2011  . Right shoulder injury 09/27/2016  . Sciatica    left leg pinched nerve numb at times about once a year    PAST SURGICAL HISTORY: Past Surgical History:  Procedure Laterality Date  . BILIARY STENT PLACEMENT N/A 09/01/2016   Procedure: BILIARY STENT PLACEMENT;  Surgeon: [name], MD;  [facility]: [facility];  Service: Endoscopy;  Laterality: N/A;  . DIAGNOSTIC LAPAROSCOPIC LIVER BIOPSY  09/16/2016   Procedure: DIAGNOSTIC LAPAROSCOPIC LIVER BIOPSY;  Surgeon: [name], MD;  Location: [facility];  Service: General;;  . ERCP N/A 07/09/2016   Procedure: ENDOSCOPIC RETROGRADE CHOLANGIOPANCREATOGRAPHY (ERCP);  Surgeon: [name], MD;  Location: [facility];  Service: Endoscopy;  Laterality: N/A;  . ERCP N/A 09/01/2016   Procedure: ENDOSCOPIC RETROGRADE CHOLANGIOPANCREATOGRAPHY (ERCP);  Surgeon: [name], MD;  Location: [facility];  Service: Endoscopy;  Laterality: N/A;  . ERCP N/A 06/28/2017   Procedure: ENDOSCOPIC RETROGRADE CHOLANGIOPANCREATOGRAPHY (ERCP);  Surgeon: [name], MD;  Location: [facility];  Service: Endoscopy;  Laterality: N/A;  . ESOPHAGOGASTRODUODENOSCOPY (EGD) WITH PROPOFOL N/A 09/01/2016   Procedure: ESOPHAGOGASTRODUODENOSCOPY (EGD) WITH PROPOFOL;  Surgeon: [name], MD;  [facility];  Service: Gastroenterology;  Laterality: N/A;  . EUS N/A 09/01/2016   Procedure: ESOPHAGEAL ENDOSCOPIC ULTRASOUND (EUS) RADIAL;  Surgeon: [name], MD;  [facility];  Service: Endoscopy;  Laterality: N/A;  . FINE NEEDLE ASPIRATION N/A 09/01/2016   Procedure: FINE NEEDLE ASPIRATION (FNA) RADIAL;   Surgeon: [name], MD;  [facility];  Service: Endoscopy;  Laterality: N/A;  . FOREIGN BODY REMOVAL N/A 09/01/2016   Procedure: FOREIGN BODY REMOVAL;  Surgeon: [name], MD;  [facility];  Service: Gastroenterology;  Laterality: N/A;  . KNEE SURGERY Right 2002   arthroscopy  . PORTA CATH INSERTION N/A 09/20/2017   Procedure: PORTA CATH INSERTION;  Surgeon: [name], MD;  Location: [place] [facility];  Service: Cardiovascular;  Laterality: N/A;  . SHOULDER SURGERY Right 2002    FAMILY HISTORY: Family History  Problem Relation Age of Onset  . Heart disease Father     ADVANCED DIRECTIVES (Y/N):  N  HEALTH MAINTENANCE: Social History   Tobacco Use  . Smoking status: Current Every Day Smoker    Packs/day: 1.00    Years: 40.00    Pack years: 40.00    Types: Cigarettes  . Smokeless tobacco: Never Used  Substance Use Topics  . Alcohol use: Yes    Alcohol/week: 7.0 standard drinks    Types: 7 Cans of beer per week    Comment: last drink 5 weeeks ago  april 2018  . Drug use: No     Colonoscopy:  PAP:  Bone density:  Lipid panel:  Allergies  Allergen Reactions  . No Known Allergies     Current Outpatient Medications  Medication Sig Dispense Refill  . albuterol (VENTOLIN HFA) 108 (90 Base) MCG/ACT inhaler Inhale 1-2 puffs into the lungs every 6 (six) hours as needed for wheezing or shortness of breath. 1 Inhaler 6  . Ipratropium-Albuterol (COMBIVENT RESPIMAT) 20-100 MCG/ACT AERS respimat Inhale 1 puff into the lungs 4 (four) times daily. 1 g 3  . megestrol (MEGACE) 40 MG tablet Take 1 tablet (40 mg total) by mouth daily. 30 tablet 2  . Oxycodone HCl 10 MG TABS Take 1 tablet (10 mg total) by mouth 2 (two) times daily as needed. 60 tablet 0  . prochlorperazine (COMPAZINE) 10 MG tablet Take 1 tablet (10 mg total) by mouth every 6 (six) hours as needed for nausea or vomiting. 60 tablet 0  . traMADol (ULTRAM) 50 MG tablet Take 1 tablet (50  mg total) by mouth every 6 (six) hours as needed. 30 tablet 0  . zolpidem (AMBIEN) 10 MG tablet Take 1 tablet (10 mg total) by mouth at bedtime as needed for sleep. 30 tablet 0   No current facility-administered medications for this visit.    OBJECTIVE: Vitals:   12/19/18 0905  BP: 119/88  Pulse: 88  Temp: (!) 97.1 F (36.2 C)  SpO2: 97%     Body mass index is 15.6 kg/m.    ECOG FS:0 - Asymptomatic  General: Thin, no acute distress. Eyes: Pink conjunctiva, anicteric sclera. HEENT: Normocephalic, moist mucous membranes. Lungs: Clear to auscultation bilaterally. Heart: Regular rate and rhythm. No rubs, murmurs, or gallops. Abdomen: Soft, nontender, nondistended. No organomegaly noted, normoactive bowel sounds. Musculoskeletal: No edema, cyanosis, or clubbing.  Limited range of motion of right shoulder. Neuro: Alert, answering all questions appropriately. Cranial nerves grossly intact. Skin: No rashes or petechiae noted. Psych: Normal affect.  LAB RESULTS:  Lab Results  Component Value Date   NA 131 (L) 12/19/2018   K 3.4 (L) 12/19/2018   CL 98 12/19/2018   CO2 25 12/19/2018   GLUCOSE 119 (H) 12/19/2018   BUN 7 12/19/2018   CREATININE  0.49 (L) 12/19/2018   CALCIUM 8.7 (L) 12/19/2018   PROT 6.5 12/19/2018   ALBUMIN 3.6 12/19/2018   AST 87 (H) 12/19/2018   ALT 85 (H) 12/19/2018   ALKPHOS 834 (H) 12/19/2018   BILITOT 0.9 12/19/2018   GFRNONAA >60 12/19/2018   GFRAA >60 12/19/2018    Lab Results  Component Value Date   WBC 4.7 12/19/2018   NEUTROABS 2.5 12/19/2018   HGB 12.6 (L) 12/19/2018   HCT 36.7 (L) 12/19/2018   MCV 94.1 12/19/2018   PLT 119 (L) 12/19/2018      STUDIES: No results found.  ASSESSMENT: Stage IV pancreatic cancer  PLAN:   1. Stage IV pancreatic cancer: Imaging, pathology, and Op note reviewed independently confirming stage IV disease with multiple peritoneal implants.  Patient's initial diagnosis was on September 01, 2016.  CT scan results  from September 11, 2018 reviewed independently with persistent disease in patient's pancreas, but no other findings suggestive of metastatic disease in the abdomen.  CA-19-9 has trended up slightly again is now 233. Patient wishes to continue treatment, but expressed understanding that his treatment options are limited.  He has also expressed interest in enrolling in a clinical trial.  Patient previously stated he has no interest in hospice and if chemotherapy were not an option he would like to pursue surgery or ablation.  We discussed that this is likely not possible.  Proceed with cycle 8, day 15 of treatment today.  Will reimage with CT scan in 4 weeks and then patient will return to clinic several days later for further evaluation, discussion of his imaging results, and consideration of cycle 9, day 1 if necessary.   2.  Elevated liver enzymes: Chronic and unchanged.  Gemcitabine does not need to be dose reduced and liver dysfunction.  Patient has not been evaluated by GI secondary to transportation issues.  Proceed with treatment as above. 3. Hyponatremia: Chronic and unchanged.  Patient's sodium is 131 today. 4. Peripheral edema: Continue elevation nightly.  Lower extremity ultrasound did not reveal DVT.   5.  Elevated bilirubin: Resolved. 6.  Pain: Slightly worse.  Continue oxycodone as needed.  Reimage as above. 7.  Poor appetite: Patient has Megace prescribed, but is unclear his compliance.   8.  Shortness of breath: Chronic and unchanged.  Patient does not complain of this today.  Continue inhalers as prescribed. 9.  Shoulder pain/tremor: Patient reports he has appointment with orthopedics tomorrow. 10.  Insomnia: Continue Ambien as needed. 11.  Rash: Resolved.  Secondary gemcitabine.  Improved with Medrol Dosepak.  Continue premedications as ordered for chemotherapy.  12.  Thrombocytopenia: Mild, monitor.   Patient expressed understanding and was in agreement with this plan. He also understands  that He can call clinic at any time with any questions, concerns, or complaints.   Cancer Staging Pancreatic cancer [facility]) Staging form: Exocrine Pancreas, AJCC 8th Edition - Clinical stage from 09/25/2016: Stage IV (cTX, cNX, pM1) - Signed by Lloyd Huger, MD on 09/25/2016   Lloyd Huger, MD   12/19/2018 4:36 PM

## 2018-12-18 ENCOUNTER — Other Ambulatory Visit: Payer: Self-pay

## 2018-12-18 ENCOUNTER — Encounter: Payer: Self-pay | Admitting: Oncology

## 2018-12-18 NOTE — Progress Notes (Signed)
Patient pre screened for office appointment, no questions or concerns today. 

## 2018-12-19 ENCOUNTER — Inpatient Hospital Stay (HOSPITAL_BASED_OUTPATIENT_CLINIC_OR_DEPARTMENT_OTHER): Payer: Medicaid Other | Admitting: Oncology

## 2018-12-19 ENCOUNTER — Other Ambulatory Visit: Payer: Self-pay

## 2018-12-19 ENCOUNTER — Inpatient Hospital Stay: Payer: Medicaid Other

## 2018-12-19 VITALS — BP 119/88 | HR 88 | Temp 97.1°F | Wt 115.0 lb

## 2018-12-19 DIAGNOSIS — C259 Malignant neoplasm of pancreas, unspecified: Secondary | ICD-10-CM

## 2018-12-19 DIAGNOSIS — Z5111 Encounter for antineoplastic chemotherapy: Secondary | ICD-10-CM | POA: Diagnosis not present

## 2018-12-19 LAB — COMPREHENSIVE METABOLIC PANEL
ALT: 85 U/L — ABNORMAL HIGH (ref 0–44)
AST: 87 U/L — ABNORMAL HIGH (ref 15–41)
Albumin: 3.6 g/dL (ref 3.5–5.0)
Alkaline Phosphatase: 834 U/L — ABNORMAL HIGH (ref 38–126)
Anion gap: 8 (ref 5–15)
BUN: 7 mg/dL (ref 6–20)
CO2: 25 mmol/L (ref 22–32)
Calcium: 8.7 mg/dL — ABNORMAL LOW (ref 8.9–10.3)
Chloride: 98 mmol/L (ref 98–111)
Creatinine, Ser: 0.49 mg/dL — ABNORMAL LOW (ref 0.61–1.24)
GFR calc Af Amer: >60 mL/min (ref >60)
GFR calc non Af Amer: >60 mL/min (ref >60)
Glucose, Bld: 119 mg/dL — ABNORMAL HIGH (ref 70–99)
Potassium: 3.4 mmol/L — ABNORMAL LOW (ref 3.5–5.1)
Sodium: 131 mmol/L — ABNORMAL LOW (ref 135–145)
Total Bilirubin: 0.9 mg/dL (ref 0.3–1.2)
Total Protein: 6.5 g/dL (ref 6.5–8.1)

## 2018-12-19 LAB — CBC WITH DIFFERENTIAL/PLATELET
Abs Immature Granulocytes: 0.03 10*3/uL (ref 0.00–0.07)
Basophils Absolute: 0 10*3/uL (ref 0.0–0.1)
Basophils Relative: 1 %
Eosinophils Absolute: 0 10*3/uL (ref 0.0–0.5)
Eosinophils Relative: 1 %
HCT: 36.7 % — ABNORMAL LOW (ref 39.0–52.0)
Hemoglobin: 12.6 g/dL — ABNORMAL LOW (ref 13.0–17.0)
Immature Granulocytes: 1 %
Lymphocytes Relative: 23 %
Lymphs Abs: 1.1 10*3/uL (ref 0.7–4.0)
MCH: 32.3 pg (ref 26.0–34.0)
MCHC: 34.3 g/dL (ref 30.0–36.0)
MCV: 94.1 fL (ref 80.0–100.0)
Monocytes Absolute: 1.1 10*3/uL — ABNORMAL HIGH (ref 0.1–1.0)
Monocytes Relative: 23 %
Neutro Abs: 2.5 10*3/uL (ref 1.7–7.7)
Neutrophils Relative %: 51 %
Platelets: 119 10*3/uL — ABNORMAL LOW (ref 150–400)
RBC: 3.9 MIL/uL — ABNORMAL LOW (ref 4.22–5.81)
RDW: 18.8 % — ABNORMAL HIGH (ref 11.5–15.5)
WBC: 4.7 10*3/uL (ref 4.0–10.5)
nRBC: 0.4 % — ABNORMAL HIGH (ref 0.0–0.2)

## 2018-12-19 MED ORDER — SODIUM CHLORIDE 0.9 % IV SOLN
Freq: Once | INTRAVENOUS | Status: AC
  Administered 2018-12-19: 11:00:00 via INTRAVENOUS
  Filled 2018-12-19: qty 250

## 2018-12-19 MED ORDER — SODIUM CHLORIDE 0.9% FLUSH
10.0000 mL | Freq: Once | INTRAVENOUS | Status: AC
  Administered 2018-12-19: 10 mL via INTRAVENOUS
  Filled 2018-12-19: qty 10

## 2018-12-19 MED ORDER — DIPHENHYDRAMINE HCL 50 MG/ML IJ SOLN
25.0000 mg | Freq: Once | INTRAMUSCULAR | Status: AC
  Administered 2018-12-19: 25 mg via INTRAVENOUS
  Filled 2018-12-19: qty 1

## 2018-12-19 MED ORDER — FAMOTIDINE IN NACL 20-0.9 MG/50ML-% IV SOLN
20.0000 mg | Freq: Once | INTRAVENOUS | Status: AC
  Administered 2018-12-19: 20 mg via INTRAVENOUS
  Filled 2018-12-19: qty 50

## 2018-12-19 MED ORDER — SODIUM CHLORIDE 0.9 % IV SOLN
1600.0000 mg | Freq: Once | INTRAVENOUS | Status: AC
  Administered 2018-12-19: 1600 mg via INTRAVENOUS
  Filled 2018-12-19: qty 26.3

## 2018-12-19 MED ORDER — PROCHLORPERAZINE MALEATE 10 MG PO TABS
10.0000 mg | ORAL_TABLET | Freq: Once | ORAL | Status: AC
  Administered 2018-12-19: 10 mg via ORAL
  Filled 2018-12-19: qty 1

## 2018-12-19 MED ORDER — METHYLPREDNISOLONE SODIUM SUCC 125 MG IJ SOLR
40.0000 mg | Freq: Once | INTRAMUSCULAR | Status: AC
  Administered 2018-12-19: 40 mg via INTRAVENOUS
  Filled 2018-12-19: qty 2

## 2018-12-19 MED ORDER — HEPARIN SOD (PORK) LOCK FLUSH 100 UNIT/ML IV SOLN
500.0000 [IU] | Freq: Once | INTRAVENOUS | Status: AC
  Administered 2018-12-19: 12:00:00 500 [IU] via INTRAVENOUS
  Filled 2018-12-19: qty 5

## 2019-01-10 ENCOUNTER — Other Ambulatory Visit: Payer: Self-pay

## 2019-01-10 ENCOUNTER — Ambulatory Visit
Admission: RE | Admit: 2019-01-10 | Discharge: 2019-01-10 | Disposition: A | Discharge status: Home / Self Care | Payer: Medicaid Other | Source: Ambulatory Visit | Attending: Oncology | Admitting: Oncology

## 2019-01-10 DIAGNOSIS — C259 Malignant neoplasm of pancreas, unspecified: Secondary | ICD-10-CM | POA: Insufficient documentation

## 2019-01-10 MED ORDER — IOHEXOL 300 MG/ML  SOLN
75.0000 mL | Freq: Once | INTRAMUSCULAR | Status: AC | PRN
  Administered 2019-01-10: 14:00:00 75 mL via INTRAVENOUS

## 2019-01-11 NOTE — Progress Notes (Signed)
[place]  [phone] [phone]  ID: Benjamin Diaz. OB: November 02, 1960  MR#: [id number]  [id number]  Patient Care Team: Patient, [name] as PCP - General (General Practice) [name], MD as Consulting Physician (Oncology) [name], MD (Inactive) as Consulting Physician (Gastroenterology) [name], MD as Consulting Physician (Gastroenterology)  CHIEF COMPLAINT: Stage IV pancreatic cancer  INTERVAL HISTORY: Patient returns to clinic today for further evaluation and discussion of his imaging results.  He has increased abdominal swelling and pain as well as difficulty sleeping.  He otherwise feels well.  He continues to have chronic weakness and fatigue.  He has no neurologic complaints.  He denies any recent fevers or illnesses.  He denies any chest pain, shortness of breath, cough, or hemoptysis. He denies any nausea, vomiting, constipation, or diarrhea. He has no urinary complaints.  Patient offers no further specific complaints today.  REVIEW OF SYSTEMS:   Review of Systems  Constitutional: Positive for malaise/fatigue. Negative for fever and weight loss.  Respiratory: Negative.  Negative for cough and shortness of breath.   Cardiovascular: Negative.  Negative for chest pain and leg swelling.  Gastrointestinal: Positive for abdominal pain. Negative for diarrhea, nausea and vomiting.  Genitourinary: Negative.  Negative for dysuria, flank pain, frequency and urgency.  Musculoskeletal: Positive for back pain and joint pain.  Skin: Negative.  Negative for itching and rash.  Neurological: Positive for weakness. Negative for tingling, tremors, sensory change and focal weakness.  Psychiatric/Behavioral: The patient has insomnia. The patient is not nervous/anxious.     As per HPI. Otherwise, a complete review of systems is negative.  PAST MEDICAL HISTORY: Past Medical History:  Diagnosis Date   Arthritis    Asthma     Atrial fibrillation ([facility])    Closed left ankle fracture age 77   COPD (chronic obstructive pulmonary disease) ([place])    Dysrhythmia    Pancreatic cancer ([place]) 06/2016   Chemo tx's.    Pneumonia 2011   Right shoulder injury 09/27/2016   Sciatica    left leg pinched nerve numb at times about once a year    PAST SURGICAL HISTORY: Past Surgical History:  Procedure Laterality Date   BILIARY STENT PLACEMENT N/A 09/01/2016   Procedure: BILIARY STENT PLACEMENT;  Surgeon: [name], MD;  [facility];  Service: Endoscopy;  Laterality: N/A;   DIAGNOSTIC LAPAROSCOPIC LIVER BIOPSY  09/16/2016   Procedure: DIAGNOSTIC LAPAROSCOPIC LIVER BIOPSY;  Surgeon: [name], MD;  Location: [place];  Service: General;;   ERCP N/A 07/09/2016   Procedure: ENDOSCOPIC RETROGRADE CHOLANGIOPANCREATOGRAPHY (ERCP);  Surgeon: [name], MD;  Location: [facility];  Service: Endoscopy;  Laterality: N/A;   ERCP N/A 09/01/2016   Procedure: ENDOSCOPIC RETROGRADE CHOLANGIOPANCREATOGRAPHY (ERCP);  Surgeon: [name], MD;  Location: [facility];  Service: Endoscopy;  Laterality: N/A;   ERCP N/A 06/28/2017   Procedure: ENDOSCOPIC RETROGRADE CHOLANGIOPANCREATOGRAPHY (ERCP);  Surgeon: [name], MD;  Location: [facility]COPY;  Service: Endoscopy;  Laterality: N/A;   ESOPHAGOGASTRODUODENOSCOPY (EGD) WITH PROPOFOL N/A 09/01/2016   Procedure: ESOPHAGOGASTRODUODENOSCOPY (EGD) WITH PROPOFOL;  Surgeon: [name], MD;  [facility];  Service: Gastroenterology;  Laterality: N/A;   EUS N/A 09/01/2016   Procedure: ESOPHAGEAL ENDOSCOPIC ULTRASOUND (EUS) RADIAL;  Surgeon: [name], MD;  [facility];  Service: Endoscopy;  Laterality: N/A;   FINE NEEDLE ASPIRATION N/A 09/01/2016   Procedure: FINE NEEDLE ASPIRATION (FNA) RADIAL;  Surgeon: [name], MD;  [facility];  Service: Endoscopy;  Laterality: N/A;   FOREIGN BODY REMOVAL N/A 09/01/2016   Procedure: FOREIGN BODY  REMOVAL;  Surgeon: [name], MD;  [facility];  Service: Gastroenterology;  Laterality: N/A;   KNEE SURGERY Right 2002   arthroscopy   PORTA CATH INSERTION N/A 09/20/2017   Procedure: PORTA CATH INSERTION;  Surgeon: [name], MD;  Location: [place] [facility];  Service: Cardiovascular;  Laterality: N/A;   SHOULDER SURGERY Right 2002    FAMILY HISTORY: Family History  Problem Relation Age of Onset   Heart disease Father     ADVANCED DIRECTIVES (Y/N):  N  HEALTH MAINTENANCE: Social History   Tobacco Use   Smoking status: Current Every Day Smoker    Packs/day: 1.00    Years: 40.00    Pack years: 40.00    Types: Cigarettes   Smokeless tobacco: Never Used  Substance Use Topics   Alcohol use: Yes    Alcohol/week: 7.0 standard drinks    Types: 7 Cans of beer per week    Comment: last drink 5 weeeks ago  april 2018   Drug use: No     Colonoscopy:  PAP:  Bone density:  Lipid panel:  Allergies  Allergen Reactions   No Known Allergies     Current Outpatient Medications  Medication Sig Dispense Refill   albuterol (VENTOLIN HFA) 108 (90 Base) MCG/ACT inhaler Inhale 1-2 puffs into the lungs every 6 (six) hours as needed for wheezing or shortness of breath. 1 Inhaler 6   Ipratropium-Albuterol (COMBIVENT RESPIMAT) 20-100 MCG/ACT AERS respimat Inhale 1 puff into the lungs 4 (four) times daily. 1 g 3   megestrol (MEGACE) 40 MG tablet Take 1 tablet (40 mg total) by mouth daily. 30 tablet 2   Oxycodone HCl 10 MG TABS Take 1 tablet (10 mg total) by mouth 2 (two) times daily as needed. 60 tablet 0   prochlorperazine (COMPAZINE) 10 MG tablet Take 1 tablet (10 mg total) by mouth every 6 (six) hours as needed for nausea or vomiting. 60 tablet 0   traMADol (ULTRAM) 50 MG tablet Take 1 tablet (50 mg total) by mouth every 6 (six) hours as needed. 30 tablet 0   furosemide (LASIX) 20 MG tablet Take 1 tablet (20 mg total) by mouth daily. 30 tablet 0    zolpidem (AMBIEN) 10 MG tablet Take 1 tablet (10 mg total) by mouth at bedtime as needed for sleep. 30 tablet 0   No current facility-administered medications for this visit.    OBJECTIVE: Vitals:   01/16/19 0930  BP: (!) 131/92  Pulse: 92  Resp: 18  Temp: (!) 95.7 F (35.4 C)     Body mass index is 15.88 kg/m.    ECOG FS:0 - Asymptomatic  General: Thin, no acute distress. Eyes: Pink conjunctiva, anicteric sclera. HEENT: Normocephalic, moist mucous membranes. Lungs: Clear to auscultation bilaterally. Heart: Regular rate and rhythm. No rubs, murmurs, or gallops. Abdomen: Mildly distended, nontender. Musculoskeletal: No edema, cyanosis, or clubbing. Neuro: Alert, answering all questions appropriately. Cranial nerves grossly intact. Skin: No rashes or petechiae noted. Psych: Normal affect.  LAB RESULTS:  Lab Results  Component Value Date   NA 129 (L) 01/16/2019   K 4.5 01/16/2019   CL 94 (L) 01/16/2019   CO2 26 01/16/2019   GLUCOSE 117 (H) 01/16/2019   BUN 12 01/16/2019   CREATININE 0.54 (L) 01/16/2019   CALCIUM 8.9 01/16/2019   PROT 6.8 01/16/2019   ALBUMIN 3.5 01/16/2019   AST 51 (H) 01/16/2019  ALT 34 01/16/2019   ALKPHOS 738 (H) 01/16/2019   BILITOT 1.4 (H) 01/16/2019   GFRNONAA >60 01/16/2019   GFRAA >60 01/16/2019    Lab Results  Component Value Date   WBC 9.7 01/16/2019   NEUTROABS 7.0 01/16/2019   HGB 13.7 01/16/2019   HCT 40.6 01/16/2019   MCV 92.5 01/16/2019   PLT 216 01/16/2019      STUDIES: Ct Abdomen Pelvis W Contrast  Result Date: 01/10/2019 CLINICAL DATA:  Restaging pancreatic cancer. EXAM: CT ABDOMEN AND PELVIS WITH CONTRAST TECHNIQUE: Multidetector CT imaging of the abdomen and pelvis was performed using the standard protocol following bolus administration of intravenous contrast. CONTRAST:  32mL OMNIPAQUE IOHEXOL 300 MG/ML  SOLN COMPARISON:  09/11/2018 FINDINGS: Lower chest: Emphysema noted in the lung bases. Mild wall thickening evident  in the distal esophagus. Hepatobiliary: Interval parenchymal volume loss noted diffusely in the liver with extensive pneumobilia. There is persistent dilatation of the extrahepatic common duct measuring up to 10 mm with common bile duct stent still in place. Gallbladder appearance similar. Pancreas: Ill-defined pancreatic head mass measured previously at 4.2 x 2.4 cm measures approximately 4.0 x 2.2 cm today. There is persistent dilatation of the main pancreatic duct, mildly progressive in the interval, measuring 8 mm today compared to about 4 mm previously. Spleen: No splenomegaly. No focal mass lesion. Adrenals/Urinary Tract: No adrenal nodule or mass. Kidneys unremarkable. No evidence for hydroureter. The urinary bladder appears normal for the degree of distention. Stomach/Bowel: Stomach is unremarkable. No gastric wall thickening. No evidence of outlet obstruction. Duodenum is normally positioned as is the ligament of Treitz. No small bowel wall thickening. No small bowel dilatation. Diffuse distention of small bowel loops is similar to prior. There is apparent wall thickening in the right colon, indeterminate given the decompressed state. Vascular/Lymphatic: There is abdominal aortic atherosclerosis without aneurysm. 10 mm short axis portal caval lymph node measured previously is 11 mm today (26/2). Abnormal soft tissue is again identified in the hepato duodenal ligament with apparent interval occlusion of the portal vein near the portal splenic confluence (well demonstrated coronal image 61/series 4). Tumor encases and attenuates the SMA although this vessel does remain patent. Splenic vein is patent. Reproductive: The prostate gland and seminal vesicles are unremarkable. Other: Moderate to large volume ascites has clearly progressed in the interval. There is edema in the omentum and mesentery. Musculoskeletal: No worrisome lytic or sclerotic osseous abnormality. IMPRESSION: 1. Although the ill-defined  pancreatic head mass does not appear substantially changed in the interval, there has been interval occlusion of the portal vein near the portal splenic confluence. 2. Fairly prominent interval progression of main pancreatic duct dilatation, now measuring 8 mm diameter. 3. Interval development of moderate to large volume ascites with edema in the omentum and mesentery. 4. Tumor involvement of the SMA again noted and while the lumen is mildly attenuated, the vessel does remain patent. 5. mild wall thickening in the distal esophagus. Esophagitis could have this appearance. 6. Interval development of diffuse parenchymal volume loss in the liver with extensive pneumobilia. 7. Apparent wall thickening in the right colon, indeterminate given the decompressed state and segments of the colonic wall adjacent to intraluminal gas are nonthickened. While appearance is felt to be related to underdistention, a component of mural edema in the right colon cannot be excluded and may be related to systemic disease or colitis. 8. Abdominal aortic atherosclerosis. Aortic Atherosclerosis (ICD10-I70.0). Electronically Signed   By: Misty Stanley M.D.   On: 01/10/2019  15:14    ASSESSMENT: Stage IV pancreatic cancer  PLAN:   1. Stage IV pancreatic cancer: Imaging, pathology, and Op note reviewed independently confirming stage IV disease with multiple peritoneal implants.  Patient's initial diagnosis was on September 01, 2016.  CT scan results from September 11, 2018 reviewed independently with persistent disease in patient's pancreas, but no other findings suggestive of metastatic disease in the abdomen.  CA-19-9 has trended up slightly again is now 233, today's result is pending.  CT scan results from January 10, 2019 reviewed independently and report as above with what appears to be essentially stable disease, but he does have interval partial occlusion of his portal vein likely contributing to his ascites.  We will hold treatment today and  proceed with paracentesis.  Return to clinic in 1 week for further evaluation and reconsideration of cycle 9, day 1 of single agent gemcitabine. 2.  Elevated liver enzymes: Mild.  Gemcitabine does not need to be dose reduced and liver dysfunction.  Patient has not been evaluated by GI secondary to transportation issues.  Proceed with treatment as above. 3. Hyponatremia: Chronic and unchanged.  Patient sodium is 129 today. 4. Peripheral edema: Continue elevation nightly.  Lower extremity ultrasound did not reveal DVT.   5.  Elevated bilirubin: Patient's bilirubin has trended up to 1.4.  Imaging as above. 6.  Pain: Slightly worse.  Continue oxycodone as needed.  Patient was given a prescription today. 7.  Poor appetite: Patient has Megace prescribed, but is unclear his compliance.   8.  Shortness of breath: Chronic and unchanged.  Patient does not complain of this today.  Continue inhalers as prescribed. 9.  Shoulder pain/tremor: Continue follow-up with orthopedics as scheduled. 10.  Insomnia: Continue Ambien as needed.  Patient was given a prescription today. 11.  Rash: Resolved.  Secondary gemcitabine.  Improved with Medrol Dosepak.  Continue premedications as ordered for chemotherapy.  12.  Thrombocytopenia: Resolved. 13.  Ascites: Likely secondary to progressive portal vein occlusion.  Paracentesis as above patient was also given a prescription for Lasix 20 mg daily.   Patient expressed understanding and was in agreement with this plan. He also understands that He can call clinic at any time with any questions, concerns, or complaints.   Cancer Staging Pancreatic cancer [facility]) Staging form: Exocrine Pancreas, AJCC 8th Edition - Clinical stage from 09/25/2016: Stage IV (cTX, cNX, pM1) - Signed by Lloyd Huger, MD on 09/25/2016   Lloyd Huger, MD   01/16/2019 6:33 PM

## 2019-01-15 NOTE — Progress Notes (Signed)
Called patient no answer left message  

## 2019-01-16 ENCOUNTER — Inpatient Hospital Stay: Payer: Medicaid Other

## 2019-01-16 ENCOUNTER — Other Ambulatory Visit: Payer: Self-pay

## 2019-01-16 ENCOUNTER — Inpatient Hospital Stay: Payer: Medicaid Other | Attending: Oncology | Admitting: Oncology

## 2019-01-16 ENCOUNTER — Encounter: Payer: Self-pay | Admitting: Oncology

## 2019-01-16 ENCOUNTER — Encounter: Payer: Self-pay | Admitting: *Deleted

## 2019-01-16 VITALS — BP 131/92 | HR 92 | Temp 95.7°F | Resp 18 | Ht 73.0 in | Wt 120.4 lb

## 2019-01-16 DIAGNOSIS — Z95828 Presence of other vascular implants and grafts: Secondary | ICD-10-CM

## 2019-01-16 DIAGNOSIS — G4700 Insomnia, unspecified: Secondary | ICD-10-CM | POA: Insufficient documentation

## 2019-01-16 DIAGNOSIS — R630 Anorexia: Secondary | ICD-10-CM | POA: Insufficient documentation

## 2019-01-16 DIAGNOSIS — R188 Other ascites: Secondary | ICD-10-CM | POA: Insufficient documentation

## 2019-01-16 DIAGNOSIS — R748 Abnormal levels of other serum enzymes: Secondary | ICD-10-CM | POA: Diagnosis not present

## 2019-01-16 DIAGNOSIS — R531 Weakness: Secondary | ICD-10-CM | POA: Diagnosis not present

## 2019-01-16 DIAGNOSIS — C786 Secondary malignant neoplasm of retroperitoneum and peritoneum: Secondary | ICD-10-CM | POA: Diagnosis not present

## 2019-01-16 DIAGNOSIS — E871 Hypo-osmolality and hyponatremia: Secondary | ICD-10-CM | POA: Diagnosis not present

## 2019-01-16 DIAGNOSIS — R5383 Other fatigue: Secondary | ICD-10-CM | POA: Insufficient documentation

## 2019-01-16 DIAGNOSIS — C259 Malignant neoplasm of pancreas, unspecified: Secondary | ICD-10-CM | POA: Diagnosis present

## 2019-01-16 LAB — COMPREHENSIVE METABOLIC PANEL
ALT: 34 U/L (ref 0–44)
AST: 51 U/L — ABNORMAL HIGH (ref 15–41)
Albumin: 3.5 g/dL (ref 3.5–5.0)
Alkaline Phosphatase: 738 U/L — ABNORMAL HIGH (ref 38–126)
Anion gap: 9 (ref 5–15)
BUN: 12 mg/dL (ref 6–20)
CO2: 26 mmol/L (ref 22–32)
Calcium: 8.9 mg/dL (ref 8.9–10.3)
Chloride: 94 mmol/L — ABNORMAL LOW (ref 98–111)
Creatinine, Ser: 0.54 mg/dL — ABNORMAL LOW (ref 0.61–1.24)
GFR calc Af Amer: >60 mL/min (ref >60)
GFR calc non Af Amer: >60 mL/min (ref >60)
Glucose, Bld: 117 mg/dL — ABNORMAL HIGH (ref 70–99)
Potassium: 4.5 mmol/L (ref 3.5–5.1)
Sodium: 129 mmol/L — ABNORMAL LOW (ref 135–145)
Total Bilirubin: 1.4 mg/dL — ABNORMAL HIGH (ref 0.3–1.2)
Total Protein: 6.8 g/dL (ref 6.5–8.1)

## 2019-01-16 LAB — CBC WITH DIFFERENTIAL/PLATELET
Abs Immature Granulocytes: 0.03 10*3/uL (ref 0.00–0.07)
Basophils Absolute: 0.1 10*3/uL (ref 0.0–0.1)
Basophils Relative: 1 %
Eosinophils Absolute: 0.2 10*3/uL (ref 0.0–0.5)
Eosinophils Relative: 2 %
HCT: 40.6 % (ref 39.0–52.0)
Hemoglobin: 13.7 g/dL (ref 13.0–17.0)
Immature Granulocytes: 0 %
Lymphocytes Relative: 11 %
Lymphs Abs: 1.1 10*3/uL (ref 0.7–4.0)
MCH: 31.2 pg (ref 26.0–34.0)
MCHC: 33.7 g/dL (ref 30.0–36.0)
MCV: 92.5 fL (ref 80.0–100.0)
Monocytes Absolute: 1.3 10*3/uL — ABNORMAL HIGH (ref 0.1–1.0)
Monocytes Relative: 14 %
Neutro Abs: 7 10*3/uL (ref 1.7–7.7)
Neutrophils Relative %: 72 %
Platelets: 216 10*3/uL (ref 150–400)
RBC: 4.39 MIL/uL (ref 4.22–5.81)
RDW: 17.1 % — ABNORMAL HIGH (ref 11.5–15.5)
WBC: 9.7 10*3/uL (ref 4.0–10.5)
nRBC: 0 % (ref 0.0–0.2)

## 2019-01-16 MED ORDER — HEPARIN SOD (PORK) LOCK FLUSH 100 UNIT/ML IV SOLN
INTRAVENOUS | Status: AC
  Filled 2019-01-16: qty 5

## 2019-01-16 MED ORDER — TRAMADOL HCL 50 MG PO TABS: 50.0000 mg | ORAL_TABLET | Freq: Four times a day (QID) | ORAL | 0 refills | Status: AC | PRN

## 2019-01-16 MED ORDER — HEPARIN SOD (PORK) LOCK FLUSH 100 UNIT/ML IV SOLN
500.0000 [IU] | Freq: Once | INTRAVENOUS | Status: AC
  Administered 2019-01-16: 10:00:00 500 [IU] via INTRAVENOUS

## 2019-01-16 MED ORDER — ZOLPIDEM TARTRATE 10 MG PO TABS: 10.0000 mg | ORAL_TABLET | Freq: Every evening | ORAL | 0 refills | Status: AC | PRN

## 2019-01-16 MED ORDER — FUROSEMIDE 20 MG PO TABS: 20.0000 mg | ORAL_TABLET | Freq: Every day | ORAL | 0 refills | Status: AC

## 2019-01-16 MED ORDER — SODIUM CHLORIDE 0.9% FLUSH
10.0000 mL | Freq: Once | INTRAVENOUS | Status: AC
  Administered 2019-01-16: 09:00:00 10 mL via INTRAVENOUS
  Filled 2019-01-16: qty 10

## 2019-01-16 NOTE — Progress Notes (Signed)
Patient states he needs a refill on his pain and sleep medication. He also states that his abdomen is getting larger, this started about 3 weeks ago.

## 2019-01-17 LAB — CANCER ANTIGEN 19-9: CA 19-9: 774 U/mL — ABNORMAL HIGH (ref 0–35)

## 2019-01-18 ENCOUNTER — Other Ambulatory Visit: Payer: Self-pay

## 2019-01-18 ENCOUNTER — Ambulatory Visit
Admission: RE | Admit: 2019-01-18 | Discharge: 2019-01-18 | Disposition: A | Discharge status: Home / Self Care | Payer: Medicaid Other | Source: Ambulatory Visit | Attending: Oncology | Admitting: Oncology

## 2019-01-18 DIAGNOSIS — C259 Malignant neoplasm of pancreas, unspecified: Secondary | ICD-10-CM | POA: Diagnosis present

## 2019-01-21 NOTE — Progress Notes (Signed)
[place]  [phone] [phone]  ID: Benjamin Diaz. OB: 1960/09/26  MR#: [id number]  [name]  Patient Care Team: [facility], [facility] as PCP - General Marin Olp, Rudell Cobb, MD as Consulting Physician (Oncology) [name], MD (Inactive) as Consulting Physician (Gastroenterology) [name], MD as Consulting Physician (Gastroenterology)  CHIEF COMPLAINT: Stage IV pancreatic cancer  INTERVAL HISTORY: Patient returns to clinic today for further evaluation and consideration of reinitiation of treatment.  His abdominal bloating and pain has improved since his paracentesis, but he feels the fluid is reaccumulating and he is not back to his baseline.  He also has a poor appetite.  He continues to have chronic weakness and fatigue.  He has no neurologic complaints.  He denies any recent fevers or illnesses.  He denies any chest pain, shortness of breath, cough, or hemoptysis. He denies any nausea, vomiting, constipation, or diarrhea. He has no urinary complaints.  Patient offers no further specific complaints today.  REVIEW OF SYSTEMS:   Review of Systems  Constitutional: Positive for malaise/fatigue. Negative for fever and weight loss.  Respiratory: Negative.  Negative for cough and shortness of breath.   Cardiovascular: Negative.  Negative for chest pain and leg swelling.  Gastrointestinal: Positive for abdominal pain. Negative for diarrhea, nausea and vomiting.  Genitourinary: Negative.  Negative for dysuria, flank pain, frequency and urgency.  Musculoskeletal: Positive for back pain and joint pain.  Skin: Negative.  Negative for itching and rash.  Neurological: Positive for weakness. Negative for tingling, tremors, sensory change and focal weakness.  Psychiatric/Behavioral: The patient has insomnia. The patient is not nervous/anxious.     As per HPI. Otherwise, a complete review of systems is negative.  PAST MEDICAL  HISTORY: Past Medical History:  Diagnosis Date   Arthritis    Asthma    Atrial fibrillation (HCC)    Closed left ankle fracture age 7   COPD (chronic obstructive pulmonary disease) ([place])    Dysrhythmia    Pancreatic cancer ([place]) 06/2016   Chemo tx's.    Pneumonia 2011   Right shoulder injury 09/27/2016   Sciatica    left leg pinched nerve numb at times about once a year    PAST SURGICAL HISTORY: Past Surgical History:  Procedure Laterality Date   BILIARY STENT PLACEMENT N/A 09/01/2016   Procedure: BILIARY STENT PLACEMENT;  Surgeon: [name], MD;  Location: [facility];  Service: Endoscopy;  Laterality: N/A;   DIAGNOSTIC LAPAROSCOPIC LIVER BIOPSY  09/16/2016   Procedure: DIAGNOSTIC LAPAROSCOPIC LIVER BIOPSY;  Surgeon: [name], MD;  Location: [place];  Service: General;;   ERCP N/A 07/09/2016   Procedure: ENDOSCOPIC RETROGRADE CHOLANGIOPANCREATOGRAPHY (ERCP);  Surgeon: [name], MD;  Location: [facility];  Service: Endoscopy;  Laterality: N/A;   ERCP N/A 09/01/2016   Procedure: ENDOSCOPIC RETROGRADE CHOLANGIOPANCREATOGRAPHY (ERCP);  Surgeon: [name], MD;  Location: [facility];  Service: Endoscopy;  Laterality: N/A;   ERCP N/A 06/28/2017   Procedure: ENDOSCOPIC RETROGRADE CHOLANGIOPANCREATOGRAPHY (ERCP);  Surgeon: [name], MD;  Location: [facility];  Service: Endoscopy;  Laterality: N/A;   ESOPHAGOGASTRODUODENOSCOPY (EGD) WITH PROPOFOL N/A 09/01/2016   Procedure: ESOPHAGOGASTRODUODENOSCOPY (EGD) WITH PROPOFOL;  Surgeon: [name], MD;  Location: [facility];  Service: Gastroenterology;  Laterality: N/A;   EUS N/A 09/01/2016   Procedure: ESOPHAGEAL ENDOSCOPIC ULTRASOUND (EUS) RADIAL;  Surgeon: [name], MD;  Location: [facility];  Service: Endoscopy;  Laterality: N/A;   FINE NEEDLE ASPIRATION N/A 09/01/2016   Procedure: FINE NEEDLE ASPIRATION (FNA)  RADIAL;  Surgeon: [name], MD;  Location: [facility];  Service: Endoscopy;   Laterality: N/A;   FOREIGN BODY REMOVAL N/A 09/01/2016   Procedure: FOREIGN BODY REMOVAL;  Surgeon: [name], MD;  Location: [facility];  Service: Gastroenterology;  Laterality: N/A;   KNEE SURGERY Right 2002   arthroscopy   PORTA CATH INSERTION N/A 09/20/2017   Procedure: PORTA CATH INSERTION;  Surgeon: [name], MD;  Location: [facility];  Service: Cardiovascular;  Laterality: N/A;   SHOULDER SURGERY Right 2002    FAMILY HISTORY: Family History  Problem Relation Age of Onset   Heart disease Father     ADVANCED DIRECTIVES (Y/N):  N  HEALTH MAINTENANCE: Social History   Tobacco Use   Smoking status: Current Every Day Smoker    Packs/day: 1.00    Years: 40.00    Pack years: 40.00    Types: Cigarettes   Smokeless tobacco: Never Used  Substance Use Topics   Alcohol use: Yes    Alcohol/week: 7.0 standard drinks    Types: 7 Cans of beer per week    Comment: last drink 5 weeeks ago  april 2018   Drug use: No     Colonoscopy:  PAP:  Bone density:  Lipid panel:  Allergies  Allergen Reactions   No Known Allergies     Current Outpatient Medications  Medication Sig Dispense Refill   albuterol (VENTOLIN HFA) 108 (90 Base) MCG/ACT inhaler Inhale 1-2 puffs into the lungs every 6 (six) hours as needed for wheezing or shortness of breath. 1 Inhaler 6   Ipratropium-Albuterol (COMBIVENT RESPIMAT) 20-100 MCG/ACT AERS respimat Inhale 1 puff into the lungs 4 (four) times daily. 1 g 3   megestrol (MEGACE) 40 MG tablet Take 1 tablet (40 mg total) by mouth daily. 30 tablet 2   Oxycodone HCl 10 MG TABS Take 1 tablet (10 mg total) by mouth 2 (two) times daily as needed. 60 tablet 0   prochlorperazine (COMPAZINE) 10 MG tablet Take 1 tablet (10 mg total) by mouth every 6 (six) hours as needed for nausea or vomiting. 60 tablet 0   traMADol (ULTRAM) 50 MG tablet Take 1 tablet (50 mg total) by mouth every 6 (six) hours as needed. 30 tablet 0   zolpidem  (AMBIEN) 10 MG tablet Take 1 tablet (10 mg total) by mouth at bedtime as needed for sleep. 30 tablet 0   furosemide (LASIX) 20 MG tablet Take 1 tablet (20 mg total) by mouth daily. (Patient not taking: Reported on 01/23/2019) 30 tablet 0   No current facility-administered medications for this visit.    OBJECTIVE: Vitals:   01/23/19 0915  BP: (!) 119/94  Pulse: (!) 112  Resp: 18  Temp: (!) 96.6 F (35.9 C)  SpO2: 96%     Body mass index is 14.47 kg/m.    ECOG FS:0 - Asymptomatic  General: Thin, no acute distress. Eyes: Pink conjunctiva, anicteric sclera. HEENT: Normocephalic, moist mucous membranes, clear oropharnyx. Lungs: Clear to auscultation bilaterally. Heart: Regular rate and rhythm. No rubs, murmurs, or gallops. Abdomen: Mildly distended, nontender. Musculoskeletal: No edema, cyanosis, or clubbing. Neuro: Alert, answering all questions appropriately. Cranial nerves grossly intact. Skin: No rashes or petechiae noted. Psych: Normal affect.  LAB RESULTS:  Lab Results  Component Value Date   NA 128 (L) 01/23/2019   K 4.2 01/23/2019   CL 94 (L) 01/23/2019   CO2 24 01/23/2019   GLUCOSE 115 (H) 01/23/2019   BUN 16 01/23/2019  CREATININE 0.58 (L) 01/23/2019   CALCIUM 8.9 01/23/2019   PROT 6.4 (L) 01/23/2019   ALBUMIN 3.2 (L) 01/23/2019   AST 49 (H) 01/23/2019   ALT 35 01/23/2019   ALKPHOS 730 (H) 01/23/2019   BILITOT 1.8 (H) 01/23/2019   GFRNONAA >60 01/23/2019   GFRAA >60 01/23/2019    Lab Results  Component Value Date   WBC 14.6 (H) 01/23/2019   NEUTROABS 11.6 (H) 01/23/2019   HGB 13.7 01/23/2019   HCT 40.7 01/23/2019   MCV 91.3 01/23/2019   PLT 233 01/23/2019      STUDIES: Ct Abdomen Pelvis W Contrast  Result Date: 01/10/2019 CLINICAL DATA:  Restaging pancreatic cancer. EXAM: CT ABDOMEN AND PELVIS WITH CONTRAST TECHNIQUE: Multidetector CT imaging of the abdomen and pelvis was performed using the standard protocol following bolus administration of  intravenous contrast. CONTRAST:  96mL OMNIPAQUE IOHEXOL 300 MG/ML  SOLN COMPARISON:  09/11/2018 FINDINGS: Lower chest: Emphysema noted in the lung bases. Mild wall thickening evident in the distal esophagus. Hepatobiliary: Interval parenchymal volume loss noted diffusely in the liver with extensive pneumobilia. There is persistent dilatation of the extrahepatic common duct measuring up to 10 mm with common bile duct stent still in place. Gallbladder appearance similar. Pancreas: Ill-defined pancreatic head mass measured previously at 4.2 x 2.4 cm measures approximately 4.0 x 2.2 cm today. There is persistent dilatation of the main pancreatic duct, mildly progressive in the interval, measuring 8 mm today compared to about 4 mm previously. Spleen: No splenomegaly. No focal mass lesion. Adrenals/Urinary Tract: No adrenal nodule or mass. Kidneys unremarkable. No evidence for hydroureter. The urinary bladder appears normal for the degree of distention. Stomach/Bowel: Stomach is unremarkable. No gastric wall thickening. No evidence of outlet obstruction. Duodenum is normally positioned as is the ligament of Treitz. No small bowel wall thickening. No small bowel dilatation. Diffuse distention of small bowel loops is similar to prior. There is apparent wall thickening in the right colon, indeterminate given the decompressed state. Vascular/Lymphatic: There is abdominal aortic atherosclerosis without aneurysm. 10 mm short axis portal caval lymph node measured previously is 11 mm today (26/2). Abnormal soft tissue is again identified in the hepato duodenal ligament with apparent interval occlusion of the portal vein near the portal splenic confluence (well demonstrated coronal image 61/series 4). Tumor encases and attenuates the SMA although this vessel does remain patent. Splenic vein is patent. Reproductive: The prostate gland and seminal vesicles are unremarkable. Other: Moderate to large volume ascites has clearly  progressed in the interval. There is edema in the omentum and mesentery. Musculoskeletal: No worrisome lytic or sclerotic osseous abnormality. IMPRESSION: 1. Although the ill-defined pancreatic head mass does not appear substantially changed in the interval, there has been interval occlusion of the portal vein near the portal splenic confluence. 2. Fairly prominent interval progression of main pancreatic duct dilatation, now measuring 8 mm diameter. 3. Interval development of moderate to large volume ascites with edema in the omentum and mesentery. 4. Tumor involvement of the SMA again noted and while the lumen is mildly attenuated, the vessel does remain patent. 5. mild wall thickening in the distal esophagus. Esophagitis could have this appearance. 6. Interval development of diffuse parenchymal volume loss in the liver with extensive pneumobilia. 7. Apparent wall thickening in the right colon, indeterminate given the decompressed state and segments of the colonic wall adjacent to intraluminal gas are nonthickened. While appearance is felt to be related to underdistention, a component of mural edema in the right colon cannot  be excluded and may be related to systemic disease or colitis. 8. Abdominal aortic atherosclerosis. Aortic Atherosclerosis (ICD10-I70.0). Electronically Signed   By: Misty Stanley M.D.   On: 01/10/2019 15:14   US Paracentesis  Result Date: 01/18/2019 INDICATION: Pancreatic carcinoma with portal vein occlusion and abdominal ascites EXAM: ULTRASOUND GUIDED  PARACENTESIS MEDICATIONS: None. COMPLICATIONS: None immediate. PROCEDURE: Informed written consent was obtained from the patient after a discussion of the risks, benefits and alternatives to treatment. A timeout was performed prior to the initiation of the procedure. Initial ultrasound scanning demonstrates a moderate amount of ascites within the abdomen . The right lateral abdomen was prepped and draped in the usual sterile fashion. 1%  lidocaine was used for local anesthesia. Following this, a Safe-T-Centesis catheter was introduced. An ultrasound image was saved for documentation purposes. The paracentesis was performed. The catheter was removed and a dressing was applied. The patient tolerated the procedure well without immediate post procedural complication. FINDINGS: A total of approximately 3.4 L of clear yellow fluid was removed. IMPRESSION: Successful ultrasound-guided paracentesis yielding 3.4 liters of peritoneal fluid. Electronically Signed   By: Lucrezia Europe M.D.   On: 01/18/2019 15:38    ASSESSMENT: Stage IV pancreatic cancer  PLAN:   1. Stage IV pancreatic cancer: Imaging, pathology, and Op note reviewed independently confirming stage IV disease with multiple peritoneal implants.  Patient's initial diagnosis was on September 01, 2016.  CT scan results from September 11, 2018 reviewed independently with persistent disease in patient's pancreas, but no other findings suggestive of metastatic disease in the abdomen.  CA-19-9 has trended up slightly again is now 233, today's result is pending.  CT scan results from January 10, 2019 reviewed independently and reported as above with what appears to be essentially stable disease, but he does have interval partial occlusion of his portal vein likely contributing to his ascites.  Patient wishes to delay treatment once again.  Return to clinic in 1 week for further evaluation and reconsideration of cycle 9, day 1 of gemcitabine.  2.  Elevated liver enzymes: Mild.  Gemcitabine does not need to be dose reduced and liver dysfunction.  Patient has not been evaluated by GI secondary to transportation issues.  Proceed with treatment as above. 3. Hyponatremia: Sodium has trended down to 128, monitor. 4. Peripheral edema: Continue elevation nightly.  Lower extremity ultrasound did not reveal DVT.   5.  Elevated bilirubin: Patient's bilirubin is trending up and is now 1.8.  Imaging as above. 6.  Pain:  Slightly worse.  Continue oxycodone as needed.  7.  Poor appetite: Patient has been instructed to take his Megace daily.  He declined dietary referral at this time. 8.  Shortness of breath: Chronic and unchanged.  Patient does not complain of this today.  Continue inhalers as prescribed. 9.  Shoulder pain/tremor: Continue follow-up with orthopedics as scheduled. 10.  Insomnia: Continue Ambien as needed.  11.  Rash: Resolved.  Secondary gemcitabine.  Improved with Medrol Dosepak.  Continue premedications as ordered for chemotherapy.  12.  Thrombocytopenia: Resolved. 13.  Ascites: Likely secondary to progressive portal vein occlusion.  Increase Lasix to 40 mg daily.  Paracentesis on January 18, 2019 removed 2.7 L of fluid.  Patient declined repeat paracentesis, but has been instructed to call clinic if his abdominal swelling becomes worse.  Patient expressed understanding and was in agreement with this plan. He also understands that He can call clinic at any time with any questions, concerns, or complaints.   Cancer  Staging Pancreatic cancer [facility]) Staging form: Exocrine Pancreas, AJCC 8th Edition - Clinical stage from 09/25/2016: Stage IV (cTX, cNX, pM1) - Signed by Lloyd Huger, MD on 09/25/2016   Lloyd Huger, MD   01/23/2019 2:54 PM

## 2019-01-23 ENCOUNTER — Other Ambulatory Visit: Payer: Self-pay

## 2019-01-23 ENCOUNTER — Inpatient Hospital Stay: Payer: Medicaid Other

## 2019-01-23 ENCOUNTER — Encounter: Payer: Self-pay | Admitting: Oncology

## 2019-01-23 ENCOUNTER — Inpatient Hospital Stay (HOSPITAL_BASED_OUTPATIENT_CLINIC_OR_DEPARTMENT_OTHER): Payer: Medicaid Other | Admitting: Oncology

## 2019-01-23 VITALS — BP 119/94 | HR 112 | Temp 96.6°F | Resp 18 | Wt 109.7 lb

## 2019-01-23 DIAGNOSIS — C259 Malignant neoplasm of pancreas, unspecified: Secondary | ICD-10-CM

## 2019-01-23 LAB — COMPREHENSIVE METABOLIC PANEL
ALT: 35 U/L (ref 0–44)
AST: 49 U/L — ABNORMAL HIGH (ref 15–41)
Albumin: 3.2 g/dL — ABNORMAL LOW (ref 3.5–5.0)
Alkaline Phosphatase: 730 U/L — ABNORMAL HIGH (ref 38–126)
Anion gap: 10 (ref 5–15)
BUN: 16 mg/dL (ref 6–20)
CO2: 24 mmol/L (ref 22–32)
Calcium: 8.9 mg/dL (ref 8.9–10.3)
Chloride: 94 mmol/L — ABNORMAL LOW (ref 98–111)
Creatinine, Ser: 0.58 mg/dL — ABNORMAL LOW (ref 0.61–1.24)
GFR calc Af Amer: >60 mL/min (ref >60)
GFR calc non Af Amer: >60 mL/min (ref >60)
Glucose, Bld: 115 mg/dL — ABNORMAL HIGH (ref 70–99)
Potassium: 4.2 mmol/L (ref 3.5–5.1)
Sodium: 128 mmol/L — ABNORMAL LOW (ref 135–145)
Total Bilirubin: 1.8 mg/dL — ABNORMAL HIGH (ref 0.3–1.2)
Total Protein: 6.4 g/dL — ABNORMAL LOW (ref 6.5–8.1)

## 2019-01-23 LAB — CBC WITH DIFFERENTIAL/PLATELET
Abs Immature Granulocytes: 0.06 10*3/uL (ref 0.00–0.07)
Basophils Absolute: 0.1 10*3/uL (ref 0.0–0.1)
Basophils Relative: 1 %
Eosinophils Absolute: 0.2 10*3/uL (ref 0.0–0.5)
Eosinophils Relative: 1 %
HCT: 40.7 % (ref 39.0–52.0)
Hemoglobin: 13.7 g/dL (ref 13.0–17.0)
Immature Granulocytes: 0 %
Lymphocytes Relative: 7 %
Lymphs Abs: 1.1 10*3/uL (ref 0.7–4.0)
MCH: 30.7 pg (ref 26.0–34.0)
MCHC: 33.7 g/dL (ref 30.0–36.0)
MCV: 91.3 fL (ref 80.0–100.0)
Monocytes Absolute: 1.7 10*3/uL — ABNORMAL HIGH (ref 0.1–1.0)
Monocytes Relative: 11 %
Neutro Abs: 11.6 10*3/uL — ABNORMAL HIGH (ref 1.7–7.7)
Neutrophils Relative %: 80 %
Platelets: 233 10*3/uL (ref 150–400)
RBC: 4.46 MIL/uL (ref 4.22–5.81)
RDW: 17 % — ABNORMAL HIGH (ref 11.5–15.5)
WBC: 14.6 10*3/uL — ABNORMAL HIGH (ref 4.0–10.5)
nRBC: 0 % (ref 0.0–0.2)

## 2019-01-23 MED ORDER — HEPARIN SOD (PORK) LOCK FLUSH 100 UNIT/ML IV SOLN
500.0000 [IU] | Freq: Once | INTRAVENOUS | Status: AC
  Administered 2019-01-23: 500 [IU] via INTRAVENOUS
  Filled 2019-01-23: qty 5

## 2019-01-23 MED ORDER — HEPARIN SOD (PORK) LOCK FLUSH 100 UNIT/ML IV SOLN: 500.0000 [IU] | Freq: Once | INTRAVENOUS | Status: AC

## 2019-01-23 MED ORDER — SODIUM CHLORIDE 0.9% FLUSH
10.0000 mL | Freq: Once | INTRAVENOUS | Status: AC
  Administered 2019-01-23: 09:00:00 10 mL via INTRAVENOUS
  Filled 2019-01-23: qty 10

## 2019-01-23 NOTE — Progress Notes (Signed)
Verbal order that pt is not getting treatment today. Pt deaccessed and all questions answered at this time. Treatment team updated.   Benjamin Diaz [employer]

## 2019-01-23 NOTE — Progress Notes (Signed)
Pt reports having hard time eating, no appetite, only able to eat very small portions at a time.  Taking pain meds and megace.

## 2019-01-28 NOTE — Progress Notes (Signed)
Benjamin Diaz  [phone] [phone]  ID: Benjamin Diaz. OB: 04-22-1960  MR#: [id number]  [id number]  Patient Care Team: Center, [facility] as PCP - General Marin Olp, Rudell Cobb, MD as Consulting Physician (Oncology) [name], MD (Inactive) as Consulting Physician (Gastroenterology) [name], MD as Consulting Physician (Gastroenterology)  CHIEF COMPLAINT: Stage IV pancreatic cancer  INTERVAL HISTORY: Patient returns to clinic today for further evaluation and consideration of treatment.  His performance status is declining.  He has minimal p.o. intake with persistent nausea and weight loss.  He has increased abdominal pain that is not relieved with his current narcotic regimen.  He is more anxious today. He has no neurologic complaints.  He denies any recent fevers or illnesses.  He denies any chest pain, shortness of breath, cough, or hemoptysis.  He has no urinary complaints.  Patient feels generally terrible, but offers no further specific complaints today.  REVIEW OF SYSTEMS:   Review of Systems  Constitutional: Positive for malaise/fatigue and weight loss. Negative for fever.  Respiratory: Negative.  Negative for cough and shortness of breath.   Cardiovascular: Negative.  Negative for chest pain and leg swelling.  Gastrointestinal: Positive for abdominal pain and nausea. Negative for diarrhea and vomiting.  Genitourinary: Negative.  Negative for dysuria, flank pain, frequency and urgency.  Musculoskeletal: Positive for back pain and joint pain.  Skin: Negative.  Negative for itching and rash.  Neurological: Positive for weakness. Negative for tingling, tremors, sensory change and focal weakness.  Psychiatric/Behavioral: The patient is nervous/anxious and has insomnia.     As per HPI. Otherwise, a complete review of systems is negative.  PAST MEDICAL HISTORY: Past Medical History:  Diagnosis Date   Arthritis     Asthma    Atrial fibrillation (HCC)    Closed left ankle fracture age 68   COPD (chronic obstructive pulmonary disease) ([place])    Dysrhythmia    Pancreatic cancer ([place]) 06/2016   Chemo tx's.    Pneumonia 2011   Right shoulder injury 09/27/2016   Sciatica    left leg pinched nerve numb at times about once a year    PAST SURGICAL HISTORY: Past Surgical History:  Procedure Laterality Date   BILIARY STENT PLACEMENT N/A 09/01/2016   Procedure: BILIARY STENT PLACEMENT;  Surgeon: [name], MD;  Location: [facility];  Service: Endoscopy;  Laterality: N/A;   DIAGNOSTIC LAPAROSCOPIC LIVER BIOPSY  09/16/2016   Procedure: DIAGNOSTIC LAPAROSCOPIC LIVER BIOPSY;  Surgeon: [name], MD;  Location: [facility];  Service: General;;   ERCP N/A 07/09/2016   Procedure: ENDOSCOPIC RETROGRADE CHOLANGIOPANCREATOGRAPHY (ERCP);  Surgeon: [name], MD;  Location: [facility];  Service: Endoscopy;  Laterality: N/A;   ERCP N/A 09/01/2016   Procedure: ENDOSCOPIC RETROGRADE CHOLANGIOPANCREATOGRAPHY (ERCP);  Surgeon: [name], MD;  Location: [facility];  Service: Endoscopy;  Laterality: N/A;   ERCP N/A 06/28/2017   Procedure: ENDOSCOPIC RETROGRADE CHOLANGIOPANCREATOGRAPHY (ERCP);  Surgeon: [name], MD;  Location: [facility];  Service: Endoscopy;  Laterality: N/A;   ESOPHAGOGASTRODUODENOSCOPY (EGD) WITH PROPOFOL N/A 09/01/2016   Procedure: ESOPHAGOGASTRODUODENOSCOPY (EGD) WITH PROPOFOL;  Surgeon: [name], MD;  Location: [facility];  Service: Gastroenterology;  Laterality: N/A;   EUS N/A 09/01/2016   Procedure: ESOPHAGEAL ENDOSCOPIC ULTRASOUND (EUS) RADIAL;  Surgeon: [name], MD;  Location: [facility];  Service: Endoscopy;  Laterality: N/A;   FINE NEEDLE ASPIRATION N/A 09/01/2016   Procedure: FINE NEEDLE ASPIRATION (FNA) RADIAL;  Surgeon: [name], MD;  Location: [facility];  Service: Endoscopy;  Laterality: N/A;   FOREIGN BODY REMOVAL N/A 09/01/2016    Procedure: FOREIGN BODY REMOVAL;  Surgeon: [name], MD;  Location: [facility];  Service: Gastroenterology;  Laterality: N/A;   KNEE SURGERY Right 2002   arthroscopy   PORTA CATH INSERTION N/A 09/20/2017   Procedure: PORTA CATH INSERTION;  Surgeon: [name], MD;  Location: [facility];  Service: Cardiovascular;  Laterality: N/A;   SHOULDER SURGERY Right 2002    FAMILY HISTORY: Family History  Problem Relation Age of Onset   Heart disease Father     ADVANCED DIRECTIVES (Y/N):  N  HEALTH MAINTENANCE: Social History   Tobacco Use   Smoking status: Current Every Day Smoker    Packs/day: 1.00    Years: 40.00    Pack years: 40.00    Types: Cigarettes   Smokeless tobacco: Never Used  Substance Use Topics   Alcohol use: Yes    Alcohol/week: 7.0 standard drinks    Types: 7 Cans of beer per week    Comment: last drink 5 weeeks ago  april 2018   Drug use: No     Colonoscopy:  PAP:  Bone density:  Lipid panel:  Allergies  Allergen Reactions   No Known Allergies     Current Outpatient Medications  Medication Sig Dispense Refill   albuterol (VENTOLIN HFA) 108 (90 Base) MCG/ACT inhaler Inhale 1-2 puffs into the lungs every 6 (six) hours as needed for wheezing or shortness of breath. 1 Inhaler 6   Ipratropium-Albuterol (COMBIVENT RESPIMAT) 20-100 MCG/ACT AERS respimat Inhale 1 puff into the lungs 4 (four) times daily. 1 g 3   megestrol (MEGACE) 40 MG tablet Take 1 tablet (40 mg total) by mouth daily. 30 tablet 2   Oxycodone HCl 10 MG TABS Take 1 tablet (10 mg total) by mouth 2 (two) times daily as needed. 60 tablet 0   prochlorperazine (COMPAZINE) 10 MG tablet Take 1 tablet (10 mg total) by mouth every 6 (six) hours as needed for nausea or vomiting. 60 tablet 0   traMADol (ULTRAM) 50 MG tablet Take 1 tablet (50 mg total) by mouth every 6 (six) hours as needed. 30 tablet 0   zolpidem (AMBIEN) 10 MG tablet Take 1 tablet (10 mg total) by mouth  at bedtime as needed for sleep. 30 tablet 0   furosemide (LASIX) 20 MG tablet Take 1 tablet (20 mg total) by mouth daily. (Patient not taking: Reported on 01/23/2019) 30 tablet 0   ondansetron (ZOFRAN ODT) 8 MG disintegrating tablet Take 1 tablet (8 mg total) by mouth every 8 (eight) hours as needed for nausea or vomiting. 20 tablet 0   Current Facility-Administered Medications  Medication Dose Route Frequency Provider Last Rate Last Dose   0.9 %  sodium chloride infusion   Intravenous Once Lloyd Huger, MD       dexamethasone (DECADRON) injection 10 mg  10 mg Intravenous Once Lloyd Huger, MD       ondansetron [facility]) injection 8 mg  8 mg Intravenous Once Lloyd Huger, MD       OBJECTIVE: Vitals:   01/30/19 0837  BP: 112/79  Pulse: (!) 110  Resp: 20  Temp: (!) 96.3 F (35.7 C)  SpO2: 95%     Body mass index is 13.79 kg/m.    ECOG FS:2 - Symptomatic, <50% confined to bed  General: Thin, no acute distress. Eyes: Pink conjunctiva, anicteric sclera. HEENT: Normocephalic, dry mucous membranes, clear oropharnyx. Lungs:  Clear to auscultation bilaterally. Heart: Regular rate and rhythm. No rubs, murmurs, or gallops. Abdomen: Mildly distended, mildly tender. Musculoskeletal: No edema, cyanosis, or clubbing. Neuro: Alert, answering all questions appropriately. Cranial nerves grossly intact. Skin: No rashes or petechiae noted. Psych: Normal affect.  LAB RESULTS:  Lab Results  Component Value Date   NA 132 (L) 01/30/2019   K 3.4 (L) 01/30/2019   CL 97 (L) 01/30/2019   CO2 24 01/30/2019   GLUCOSE 140 (H) 01/30/2019   BUN 28 (H) 01/30/2019   CREATININE 0.54 (L) 01/30/2019   CALCIUM 8.5 (L) 01/30/2019   PROT 6.1 (L) 01/30/2019   ALBUMIN 3.1 (L) 01/30/2019   AST 42 (H) 01/30/2019   ALT 36 01/30/2019   ALKPHOS 487 (H) 01/30/2019   BILITOT 1.3 (H) 01/30/2019   GFRNONAA >60 01/30/2019   GFRAA >60 01/30/2019    Lab Results  Component Value Date   WBC  18.4 (H) 01/30/2019   NEUTROABS 15.0 (H) 01/30/2019   HGB 12.2 (L) 01/30/2019   HCT 36.5 (L) 01/30/2019   MCV 89.7 01/30/2019   PLT 277 01/30/2019      STUDIES: Ct Abdomen Pelvis W Contrast  Result Date: 01/10/2019 CLINICAL DATA:  Restaging pancreatic cancer. EXAM: CT ABDOMEN AND PELVIS WITH CONTRAST TECHNIQUE: Multidetector CT imaging of the abdomen and pelvis was performed using the standard protocol following bolus administration of intravenous contrast. CONTRAST:  2mL OMNIPAQUE IOHEXOL 300 MG/ML  SOLN COMPARISON:  09/11/2018 FINDINGS: Lower chest: Emphysema noted in the lung bases. Mild wall thickening evident in the distal esophagus. Hepatobiliary: Interval parenchymal volume loss noted diffusely in the liver with extensive pneumobilia. There is persistent dilatation of the extrahepatic common duct measuring up to 10 mm with common bile duct stent still in place. Gallbladder appearance similar. Pancreas: Ill-defined pancreatic head mass measured previously at 4.2 x 2.4 cm measures approximately 4.0 x 2.2 cm today. There is persistent dilatation of the main pancreatic duct, mildly progressive in the interval, measuring 8 mm today compared to about 4 mm previously. Spleen: No splenomegaly. No focal mass lesion. Adrenals/Urinary Tract: No adrenal nodule or mass. Kidneys unremarkable. No evidence for hydroureter. The urinary bladder appears normal for the degree of distention. Stomach/Bowel: Stomach is unremarkable. No gastric wall thickening. No evidence of outlet obstruction. Duodenum is normally positioned as is the ligament of Treitz. No small bowel wall thickening. No small bowel dilatation. Diffuse distention of small bowel loops is similar to prior. There is apparent wall thickening in the right colon, indeterminate given the decompressed state. Vascular/Lymphatic: There is abdominal aortic atherosclerosis without aneurysm. 10 mm short axis portal caval lymph node measured previously is 11 mm  today (26/2). Abnormal soft tissue is again identified in the hepato duodenal ligament with apparent interval occlusion of the portal vein near the portal splenic confluence (well demonstrated coronal image 61/series 4). Tumor encases and attenuates the SMA although this vessel does remain patent. Splenic vein is patent. Reproductive: The prostate gland and seminal vesicles are unremarkable. Other: Moderate to large volume ascites has clearly progressed in the interval. There is edema in the omentum and mesentery. Musculoskeletal: No worrisome lytic or sclerotic osseous abnormality. IMPRESSION: 1. Although the ill-defined pancreatic head mass does not appear substantially changed in the interval, there has been interval occlusion of the portal vein near the portal splenic confluence. 2. Fairly prominent interval progression of main pancreatic duct dilatation, now measuring 8 mm diameter. 3. Interval development of moderate to large volume ascites with edema in the  omentum and mesentery. 4. Tumor involvement of the SMA again noted and while the lumen is mildly attenuated, the vessel does remain patent. 5. mild wall thickening in the distal esophagus. Esophagitis could have this appearance. 6. Interval development of diffuse parenchymal volume loss in the liver with extensive pneumobilia. 7. Apparent wall thickening in the right colon, indeterminate given the decompressed state and segments of the colonic wall adjacent to intraluminal gas are nonthickened. While appearance is felt to be related to underdistention, a component of mural edema in the right colon cannot be excluded and may be related to systemic disease or colitis. 8. Abdominal aortic atherosclerosis. Aortic Atherosclerosis (ICD10-I70.0). Electronically Signed   By: Misty Stanley M.D.   On: 01/10/2019 15:14   US Paracentesis  Result Date: 01/18/2019 INDICATION: Pancreatic carcinoma with portal vein occlusion and abdominal ascites EXAM: ULTRASOUND  GUIDED  PARACENTESIS MEDICATIONS: None. COMPLICATIONS: None immediate. PROCEDURE: Informed written consent was obtained from the patient after a discussion of the risks, benefits and alternatives to treatment. A timeout was performed prior to the initiation of the procedure. Initial ultrasound scanning demonstrates a moderate amount of ascites within the abdomen . The right lateral abdomen was prepped and draped in the usual sterile fashion. 1% lidocaine was used for local anesthesia. Following this, a Safe-T-Centesis catheter was introduced. An ultrasound image was saved for documentation purposes. The paracentesis was performed. The catheter was removed and a dressing was applied. The patient tolerated the procedure well without immediate post procedural complication. FINDINGS: A total of approximately 3.4 L of clear yellow fluid was removed. IMPRESSION: Successful ultrasound-guided paracentesis yielding 3.4 liters of peritoneal fluid. Electronically Signed   By: Lucrezia Europe M.D.   On: 01/18/2019 15:38    ASSESSMENT: Stage IV pancreatic cancer  PLAN:   1. Stage IV pancreatic cancer: Imaging, pathology, and Op note reviewed independently confirming stage IV disease with multiple peritoneal implants.  Patient's initial diagnosis was on September 01, 2016.  CT scan results from January 10, 2019 reviewed independently and reported as above with what appears to be essentially stable disease, but he does have interval partial occlusion of his portal vein likely contributing to his ascites.  Despite this, patient's CA 19-9 has trended up significantly and he is more symptomatic.  Will get a PET scan this week to further assess disease burden.  We briefly discussed hospice again, but patient is not interested and wishes to see the results of the PET scan first.  Return to clinic in 1 week to discuss the results.  Appreciate palliative care input.   2.  Elevated liver enzymes: Chronic and unchanged. 3. Hyponatremia:  Sodium slightly improved today to 132. 4. Peripheral edema: Continue elevation nightly.  Lower extremity ultrasound did not reveal DVT.   5.  Elevated bilirubin: Bilirubin has decreased slightly to 1.3, monitor. 6.  Pain: Patient reports oxycodone no longer works and he cannot take pills given his significant nausea.  He was given a prescription for concentrated liquid morphine. 7.  Poor appetite/nausea: Patient has difficulty taking pills, therefore he was given a prescription for ODT Zofran.  Patient will also receive IV fluids along with IV Zofran and dexamethasone today. 8.  Shortness of breath: Chronic and unchanged.  Patient does not complain of this today.  Continue inhalers as prescribed. 9.  Shoulder pain/tremor: Patient does not complain of this today. 10.  Insomnia: Continue Ambien as needed.  11.  Rash: Resolved.  Secondary gemcitabine.  Improved with Medrol Dosepak.  Continue  premedications as ordered for chemotherapy.  12.  Thrombocytopenia: Resolved. 13.  Ascites: Likely secondary to progressive portal vein occlusion.  Continue Lasix 40 mg daily.  Paracentesis on January 18, 2019 removed 2.7 L of fluid.  Patient declined repeat paracentesis, but has been instructed to call clinic if his abdominal swelling becomes worse. 14.  End-of-life/hospice: Patient does not wish to enroll in hospice at this time, but is considering hospice at home.  Appreciate palliative care input.  Patient expressed understanding and was in agreement with this plan. He also understands that He can call clinic at any time with any questions, concerns, or complaints.   Cancer Staging Pancreatic cancer [facility]) Staging form: Exocrine Pancreas, AJCC 8th Edition - Clinical stage from 09/25/2016: Stage IV (cTX, cNX, pM1) - Signed by Lloyd Huger, MD on 09/25/2016   Lloyd Huger, MD   01/30/2019 9:13 AM

## 2019-01-29 NOTE — Progress Notes (Signed)
Patient is coming in for follow up, he mentions having trouble swallowing, he was very hard to understand. States not feeling well today  Has not eaten in two weeks

## 2019-01-30 ENCOUNTER — Inpatient Hospital Stay (HOSPITAL_BASED_OUTPATIENT_CLINIC_OR_DEPARTMENT_OTHER): Payer: Medicaid Other | Admitting: Hospice and Palliative Medicine

## 2019-01-30 ENCOUNTER — Inpatient Hospital Stay: Payer: Medicaid Other

## 2019-01-30 ENCOUNTER — Inpatient Hospital Stay (HOSPITAL_BASED_OUTPATIENT_CLINIC_OR_DEPARTMENT_OTHER): Payer: Medicaid Other | Admitting: Oncology

## 2019-01-30 ENCOUNTER — Inpatient Hospital Stay: Payer: Medicaid Other | Attending: Oncology

## 2019-01-30 ENCOUNTER — Other Ambulatory Visit: Payer: Self-pay

## 2019-01-30 DIAGNOSIS — E871 Hypo-osmolality and hyponatremia: Secondary | ICD-10-CM | POA: Diagnosis not present

## 2019-01-30 DIAGNOSIS — C259 Malignant neoplasm of pancreas, unspecified: Secondary | ICD-10-CM

## 2019-01-30 DIAGNOSIS — R110 Nausea: Secondary | ICD-10-CM | POA: Diagnosis not present

## 2019-01-30 DIAGNOSIS — Z515 Encounter for palliative care: Secondary | ICD-10-CM

## 2019-01-30 LAB — CBC WITH DIFFERENTIAL/PLATELET
Abs Immature Granulocytes: 0.09 10*3/uL — ABNORMAL HIGH (ref 0.00–0.07)
Basophils Absolute: 0 10*3/uL (ref 0.0–0.1)
Basophils Relative: 0 %
Eosinophils Absolute: 0 10*3/uL (ref 0.0–0.5)
Eosinophils Relative: 0 %
HCT: 36.5 % — ABNORMAL LOW (ref 39.0–52.0)
Hemoglobin: 12.2 g/dL — ABNORMAL LOW (ref 13.0–17.0)
Immature Granulocytes: 1 %
Lymphocytes Relative: 6 %
Lymphs Abs: 1.2 10*3/uL (ref 0.7–4.0)
MCH: 30 pg (ref 26.0–34.0)
MCHC: 33.4 g/dL (ref 30.0–36.0)
MCV: 89.7 fL (ref 80.0–100.0)
Monocytes Absolute: 2.2 10*3/uL — ABNORMAL HIGH (ref 0.1–1.0)
Monocytes Relative: 12 %
Neutro Abs: 15 10*3/uL — ABNORMAL HIGH (ref 1.7–7.7)
Neutrophils Relative %: 81 %
Platelets: 277 10*3/uL (ref 150–400)
RBC: 4.07 MIL/uL — ABNORMAL LOW (ref 4.22–5.81)
RDW: 16.5 % — ABNORMAL HIGH (ref 11.5–15.5)
WBC: 18.4 10*3/uL — ABNORMAL HIGH (ref 4.0–10.5)
nRBC: 0 % (ref 0.0–0.2)

## 2019-01-30 LAB — COMPREHENSIVE METABOLIC PANEL
ALT: 36 U/L (ref 0–44)
AST: 42 U/L — ABNORMAL HIGH (ref 15–41)
Albumin: 3.1 g/dL — ABNORMAL LOW (ref 3.5–5.0)
Alkaline Phosphatase: 487 U/L — ABNORMAL HIGH (ref 38–126)
Anion gap: 11 (ref 5–15)
BUN: 28 mg/dL — ABNORMAL HIGH (ref 6–20)
CO2: 24 mmol/L (ref 22–32)
Calcium: 8.5 mg/dL — ABNORMAL LOW (ref 8.9–10.3)
Chloride: 97 mmol/L — ABNORMAL LOW (ref 98–111)
Creatinine, Ser: 0.54 mg/dL — ABNORMAL LOW (ref 0.61–1.24)
GFR calc Af Amer: >60 mL/min (ref >60)
GFR calc non Af Amer: >60 mL/min (ref >60)
Glucose, Bld: 140 mg/dL — ABNORMAL HIGH (ref 70–99)
Potassium: 3.4 mmol/L — ABNORMAL LOW (ref 3.5–5.1)
Sodium: 132 mmol/L — ABNORMAL LOW (ref 135–145)
Total Bilirubin: 1.3 mg/dL — ABNORMAL HIGH (ref 0.3–1.2)
Total Protein: 6.1 g/dL — ABNORMAL LOW (ref 6.5–8.1)

## 2019-01-30 MED ORDER — SODIUM CHLORIDE 0.9 % IV SOLN: 10.0000 mg | Freq: Once | INTRAVENOUS | Status: DC

## 2019-01-30 MED ORDER — HEPARIN SOD (PORK) LOCK FLUSH 100 UNIT/ML IV SOLN
500.0000 [IU] | Freq: Once | INTRAVENOUS | Status: AC
  Administered 2019-01-30: 500 [IU] via INTRAVENOUS

## 2019-01-30 MED ORDER — ONDANSETRON HCL 4 MG/2ML IJ SOLN
8.0000 mg | Freq: Once | INTRAMUSCULAR | Status: AC
  Administered 2019-01-30: 8 mg via INTRAVENOUS

## 2019-01-30 MED ORDER — SODIUM CHLORIDE 0.9% FLUSH
10.0000 mL | Freq: Once | INTRAVENOUS | Status: AC
  Administered 2019-01-30: 08:00:00 10 mL via INTRAVENOUS
  Filled 2019-01-30: qty 10

## 2019-01-30 MED ORDER — DEXAMETHASONE SODIUM PHOSPHATE 10 MG/ML IJ SOLN
10.0000 mg | Freq: Once | INTRAMUSCULAR | Status: AC
  Administered 2019-01-30: 10 mg via INTRAVENOUS

## 2019-01-30 MED ORDER — HEPARIN SOD (PORK) LOCK FLUSH 100 UNIT/ML IV SOLN
INTRAVENOUS | Status: AC
  Filled 2019-01-30: qty 5

## 2019-01-30 MED ORDER — SODIUM CHLORIDE 0.9 % IV SOLN
Freq: Once | INTRAVENOUS | Status: AC
  Administered 2019-01-30: 10:00:00 via INTRAVENOUS
  Filled 2019-01-30: qty 250

## 2019-01-30 MED ORDER — ONDANSETRON 8 MG PO TBDP: 8.0000 mg | ORAL_TABLET | Freq: Three times a day (TID) | ORAL | 0 refills | Status: AC | PRN

## 2019-01-30 NOTE — Progress Notes (Signed)
[place]  [phone] [phone]   Name: Benjamin Diaz. Date: 01/30/2019 MRN: [id number]  DOB: 01-22-1961  Patient Care Team: [facility] [place] as PCP - General Marin Olp, Rudell Cobb, MD as Consulting Physician (Oncology) [name], MD (Inactive) as Consulting Physician (Gastroenterology) [name], MD as Consulting Physician (Gastroenterology)    REASON FOR CONSULTATION: Palliative Care consult requested for this 58 y.o. male with multiple medical problems including stage IV pancreatic cancer currently being treated on gemcitabine.  PET scan from May 05, 2018 revealed disease progression.  Patient also had elevated CA 19-9 was also elevated.  Hospice was discussed but patient opted to continue treatment.  However, treatment options are felt to be limited.  Patient was referred to palliative care to continue discussions regarding goals.  SOCIAL HISTORY:     reports that he has been smoking cigarettes. He has a 40.00 pack-year smoking history. He has never used smokeless tobacco. He reports current alcohol use of about 7.0 standard drinks of alcohol per week. He reports that he does not use drugs.   Patient was born in [place].  His father was in the [employer] and patient later moved to [place] where he lived until 2006.  Patient moved to [facility] after he divorced.  He has 3 children, all of whom live in [place].  Patient has a former significant other, [name], who is his primary support system here.  Patient lives at home alone.  He used to drive a forklift.  ADVANCE DIRECTIVES:  Does not have  CODE STATUS: Full code  PAST MEDICAL HISTORY: Past Medical History:  Diagnosis Date   Arthritis    Asthma    Atrial fibrillation (HCC)    Closed left ankle fracture age 35   COPD (chronic obstructive pulmonary disease) ([place])    Dysrhythmia    Pancreatic cancer ([place]) 06/2016     Chemo tx's.    Pneumonia 2011   Right shoulder injury 09/27/2016   Sciatica    left leg pinched nerve numb at times about once a year    PAST SURGICAL HISTORY:  Past Surgical History:  Procedure Laterality Date   BILIARY STENT PLACEMENT N/A 09/01/2016   Procedure: BILIARY STENT PLACEMENT;  Surgeon: [name], MD;  Location: [facility];  Service: Endoscopy;  Laterality: N/A;   DIAGNOSTIC LAPAROSCOPIC LIVER BIOPSY  09/16/2016   Procedure: DIAGNOSTIC LAPAROSCOPIC LIVER BIOPSY;  Surgeon: [name], MD;  Location: [facility];  Service: General;;   ERCP N/A 07/09/2016   Procedure: ENDOSCOPIC RETROGRADE CHOLANGIOPANCREATOGRAPHY (ERCP);  Surgeon: [name], MD;  Location: [facility];  Service: Endoscopy;  Laterality: N/A;   ERCP N/A 09/01/2016   Procedure: ENDOSCOPIC RETROGRADE CHOLANGIOPANCREATOGRAPHY (ERCP);  Surgeon: [name], MD;  Location: [facility];  Service: Endoscopy;  Laterality: N/A;   ERCP N/A 06/28/2017   Procedure: ENDOSCOPIC RETROGRADE CHOLANGIOPANCREATOGRAPHY (ERCP);  Surgeon: [name], MD;  Location: [facility];  Service: Endoscopy;  Laterality: N/A;   ESOPHAGOGASTRODUODENOSCOPY (EGD) WITH PROPOFOL N/A 09/01/2016   Procedure: ESOPHAGOGASTRODUODENOSCOPY (EGD) WITH PROPOFOL;  Surgeon: [name], MD;  Location: [facility];  Service: Gastroenterology;  Laterality: N/A;   EUS N/A 09/01/2016   Procedure: ESOPHAGEAL ENDOSCOPIC ULTRASOUND (EUS) RADIAL;  Surgeon: [name], MD;  Location: [facility];  Service: Endoscopy;  Laterality: N/A;   FINE NEEDLE ASPIRATION N/A 09/01/2016   Procedure: FINE NEEDLE ASPIRATION (FNA) RADIAL;  Surgeon: [name], MD;  Location: [facility];  Service: Endoscopy;  Laterality:  FOREIGN BODY REMOVAL N/A 09/01/2016  ° Procedure: FOREIGN BODY REMOVAL;  Surgeon: Karki, Arya, MD;  Location: [facility];  Service: Gastroenterology;  Laterality: N/A;  °• KNEE SURGERY Right 2002  ° arthroscopy  °• PORTA CATH  INSERTION N/A 09/20/2017  ° Procedure: PORTA CATH INSERTION;  Surgeon: Schnier, Gregory G, MD;  Location: [facility];  Service: Cardiovascular;  Laterality: N/A;  °• SHOULDER SURGERY Right 2002  ° ° °HEMATOLOGY/ONCOLOGY HISTORY:  °Oncology History  °Pancreatic cancer (HCC)  °09/20/2016 Initial Diagnosis  ° Pancreatic cancer (HCC) °  °07/22/2017 - 09/06/2017 Chemotherapy  ° The patient had palonosetron (ALOXI) injection 0.25 mg, 0.25 mg, Intravenous,  Once, 2 of 6 cycles °Administration: 0.25 mg (07/27/2017), 0.25 mg (08/17/2017) °oxaliplatin (ELOXATIN) 150 mg in dextrose 5 % 500 mL chemo infusion, 155 mg (100 % of original dose 100 mg/m2), Intravenous,  Once, 2 of 6 cycles °Dose modification: 100 mg/m2 (original dose 100 mg/m2, Cycle 1, Reason: Provider Judgment) °Administration: 150 mg (07/27/2017), 150 mg (08/17/2017) ° for chemotherapy treatment.  °  °08/20/2017 - 09/06/2017 Chemotherapy  ° The patient had palonosetron (ALOXI) injection 0.25 mg, 0.25 mg, Intravenous,  Once, 0 of 4 cycles °irinotecan (CAMPTOSAR) 240 mg in dextrose 5 % 500 mL chemo infusion, 150 mg/m2 = 240 mg (100 % of original dose 150 mg/m2), Intravenous,  Once, 0 of 4 cycles °Dose modification: 150 mg/m2 (original dose 150 mg/m2, Cycle 1, Reason: Provider Judgment) °leucovorin 620 mg in dextrose 5 % 250 mL infusion, 400 mg/m2 = 620 mg, Intravenous,  Once, 0 of 4 cycles °oxaliplatin (ELOXATIN) 130 mg in dextrose 5 % 500 mL chemo infusion, 85 mg/m2 = 130 mg, Intravenous,  Once, 0 of 4 cycles °fluorouracil (ADRUCIL) chemo injection 600 mg, 400 mg/m2 = 600 mg, Intravenous,  Once, 0 of 4 cycles °fluorouracil (ADRUCIL) 3,700 mg in sodium chloride 0.9 % 76 mL chemo infusion, 2,400 mg/m2 = 3,700 mg, Intravenous, 1 Day/Dose, 0 of 4 cycles ° for chemotherapy treatment.  °  °09/07/2017 - 04/26/2018 Chemotherapy  ° The patient had palonosetron (ALOXI) injection 0.25 mg, 0.25 mg, Intravenous,  Once, 10 of 11 cycles °Administration: 0.25 mg (09/07/2017), 0.25 mg  (09/28/2017), 0.25 mg (10/19/2017), 0.25 mg (11/16/2017), 0.25 mg (12/07/2017), 0.25 mg (12/28/2017), 0.25 mg (01/18/2018), 0.25 mg (02/16/2018), 0.25 mg (03/16/2018), 0.25 mg (04/06/2018) °oxaliplatin (ELOXATIN) 150 mg in dextrose 5 % 500 mL chemo infusion, 160 mg (100 % of original dose 100 mg/m2), Intravenous,  Once, 10 of 11 cycles °Dose modification: 100 mg/m2 (original dose 100 mg/m2, Cycle 1, Reason: Provider Judgment) °Administration: 150 mg (09/07/2017), 150 mg (09/28/2017), 150 mg (10/19/2017), 150 mg (11/16/2017), 150 mg (12/07/2017), 150 mg (12/28/2017), 150 mg (01/18/2018), 150 mg (02/16/2018), 150 mg (03/16/2018), 150 mg (04/06/2018) ° for chemotherapy treatment.  °  °05/15/2018 -  Chemotherapy  ° The patient had gemcitabine (GEMZAR) 1,600 mg in sodium chloride 0.9 % 250 mL chemo infusion, 1,558 mg, Intravenous,  Once, 8 of 12 cycles °Administration: 1,600 mg (05/15/2018), 1,600 mg (05/22/2018), 1,600 mg (06/05/2018), 1,600 mg (06/19/2018), 1,600 mg (06/26/2018), 1,600 mg (07/03/2018), 1,600 mg (07/17/2018), 1,600 mg (07/24/2018), 1,600 mg (07/31/2018), 1,600 mg (08/14/2018), 1,600 mg (08/23/2018), 1,600 mg (08/29/2018), 1,600 mg (09/12/2018), 1,600 mg (09/19/2018), 1,600 mg (09/26/2018), 1,600 mg (10/10/2018), 1,600 mg (10/17/2018), 1,600 mg (10/24/2018), 1,600 mg (11/07/2018), 1,600 mg (11/15/2018), 1,600 mg (11/21/2018), 1,600 mg (12/05/2018), 1,600 mg (12/12/2018), 1,600 mg (12/19/2018) ° for chemotherapy treatment.  °  ° ° °ALLERGIES:  is allergic to no known allergies. ° °  known allergies.  MEDICATIONS:  Current Outpatient Medications  Medication Sig Dispense Refill   albuterol (VENTOLIN HFA) 108 (90 Base) MCG/ACT inhaler Inhale 1-2 puffs into the lungs every 6 (six) hours as needed for wheezing or shortness of breath. 1 Inhaler 6   furosemide (LASIX) 20 MG tablet Take 1 tablet (20 mg total) by mouth daily. (Patient not taking: Reported on 01/23/2019) 30 tablet 0   Ipratropium-Albuterol (COMBIVENT RESPIMAT) 20-100 MCG/ACT AERS respimat Inhale 1 puff  into the lungs 4 (four) times daily. 1 g 3   megestrol (MEGACE) 40 MG tablet Take 1 tablet (40 mg total) by mouth daily. 30 tablet 2   ondansetron (ZOFRAN ODT) 8 MG disintegrating tablet Take 1 tablet (8 mg total) by mouth every 8 (eight) hours as needed for nausea or vomiting. 20 tablet 0   Oxycodone HCl 10 MG TABS Take 1 tablet (10 mg total) by mouth 2 (two) times daily as needed. 60 tablet 0   prochlorperazine (COMPAZINE) 10 MG tablet Take 1 tablet (10 mg total) by mouth every 6 (six) hours as needed for nausea or vomiting. 60 tablet 0   traMADol (ULTRAM) 50 MG tablet Take 1 tablet (50 mg total) by mouth every 6 (six) hours as needed. 30 tablet 0   zolpidem (AMBIEN) 10 MG tablet Take 1 tablet (10 mg total) by mouth at bedtime as needed for sleep. 30 tablet 0   No current facility-administered medications for this visit.    Facility-Administered Medications Ordered in Other Visits  Medication Dose Route Frequency Provider Last Rate Last Dose   heparin lock flush 100 unit/mL  500 Units Intravenous Once Lloyd Huger, MD        VITAL SIGNS: There were no vitals taken for this visit. There were no vitals filed for this visit.  Estimated body mass index is 13.79 kg/m as calculated from the following:   Height as of 01/16/19: 6' 1" (1.854 m).   Weight as of an earlier encounter on 01/30/19: 104 lb 8 oz (47.4 kg).  LABS: CBC:    Component Value Date/Time   WBC 18.4 (H) 01/30/2019 0813   HGB 12.2 (L) 01/30/2019 0813   HCT 36.5 (L) 01/30/2019 0813   PLT 277 01/30/2019 0813   MCV 89.7 01/30/2019 0813   NEUTROABS 15.0 (H) 01/30/2019 0813   NEUTROABS 4 01/23/2014   LYMPHSABS 1.2 01/30/2019 0813   MONOABS 2.2 (H) 01/30/2019 0813   EOSABS 0.0 01/30/2019 0813   BASOSABS 0.0 01/30/2019 0813   Comprehensive Metabolic Panel:    Component Value Date/Time   NA 132 (L) 01/30/2019 0813   NA 139 01/23/2014   K 3.4 (L) 01/30/2019 0813   CL 97 (L) 01/30/2019 0813   CO2 24  01/30/2019 0813   BUN 28 (H) 01/30/2019 0813   BUN 5 01/23/2014   CREATININE 0.54 (L) 01/30/2019 0813   GLUCOSE 140 (H) 01/30/2019 0813   CALCIUM 8.5 (L) 01/30/2019 0813   AST 42 (H) 01/30/2019 0813   ALT 36 01/30/2019 0813   ALKPHOS 487 (H) 01/30/2019 0813   BILITOT 1.3 (H) 01/30/2019 0813   PROT 6.1 (L) 01/30/2019 0813   ALBUMIN 3.1 (L) 01/30/2019 0813    RADIOGRAPHIC STUDIES: Ct Abdomen Pelvis W Contrast  Result Date: 01/10/2019 CLINICAL DATA:  Restaging pancreatic cancer. EXAM: CT ABDOMEN AND PELVIS WITH CONTRAST TECHNIQUE: Multidetector CT imaging of the abdomen and pelvis was performed using the standard protocol following bolus administration of intravenous contrast. CONTRAST:  20m OMNIPAQUE IOHEXOL 300  MG/ML  SOLN COMPARISON:  09/11/2018 FINDINGS: Lower chest: Emphysema noted in the lung bases. Mild wall thickening evident in the distal esophagus. Hepatobiliary: Interval parenchymal volume loss noted diffusely in the liver with extensive pneumobilia. There is persistent dilatation of the extrahepatic common duct measuring up to 10 mm with common bile duct stent still in place. Gallbladder appearance similar. Pancreas: Ill-defined pancreatic head mass measured previously at 4.2 x 2.4 cm measures approximately 4.0 x 2.2 cm today. There is persistent dilatation of the main pancreatic duct, mildly progressive in the interval, measuring 8 mm today compared to about 4 mm previously. Spleen: No splenomegaly. No focal mass lesion. Adrenals/Urinary Tract: No adrenal nodule or mass. Kidneys unremarkable. No evidence for hydroureter. The urinary bladder appears normal for the degree of distention. Stomach/Bowel: Stomach is unremarkable. No gastric wall thickening. No evidence of outlet obstruction. Duodenum is normally positioned as is the ligament of Treitz. No small bowel wall thickening. No small bowel dilatation. Diffuse distention of small bowel loops is similar to prior. There is apparent wall  thickening in the right colon, indeterminate given the decompressed state. Vascular/Lymphatic: There is abdominal aortic atherosclerosis without aneurysm. 10 mm short axis portal caval lymph node measured previously is 11 mm today (26/2). Abnormal soft tissue is again identified in the hepato duodenal ligament with apparent interval occlusion of the portal vein near the portal splenic confluence (well demonstrated coronal image 61/series 4). Tumor encases and attenuates the SMA although this vessel does remain patent. Splenic vein is patent. Reproductive: The prostate gland and seminal vesicles are unremarkable. Other: Moderate to large volume ascites has clearly progressed in the interval. There is edema in the omentum and mesentery. Musculoskeletal: No worrisome lytic or sclerotic osseous abnormality. IMPRESSION: 1. Although the ill-defined pancreatic head mass does not appear substantially changed in the interval, there has been interval occlusion of the portal vein near the portal splenic confluence. 2. Fairly prominent interval progression of main pancreatic duct dilatation, now measuring 8 mm diameter. 3. Interval development of moderate to large volume ascites with edema in the omentum and mesentery. 4. Tumor involvement of the SMA again noted and while the lumen is mildly attenuated, the vessel does remain patent. 5. mild wall thickening in the distal esophagus. Esophagitis could have this appearance. 6. Interval development of diffuse parenchymal volume loss in the liver with extensive pneumobilia. 7. Apparent wall thickening in the right colon, indeterminate given the decompressed state and segments of the colonic wall adjacent to intraluminal gas are nonthickened. While appearance is felt to be related to underdistention, a component of mural edema in the right colon cannot be excluded and may be related to systemic disease or colitis. 8. Abdominal aortic atherosclerosis. Aortic Atherosclerosis  (ICD10-I70.0). Electronically Signed   By: Misty Stanley M.D.   On: 01/10/2019 15:14   US Paracentesis  Result Date: 01/18/2019 INDICATION: Pancreatic carcinoma with portal vein occlusion and abdominal ascites EXAM: ULTRASOUND GUIDED  PARACENTESIS MEDICATIONS: None. COMPLICATIONS: None immediate. PROCEDURE: Informed written consent was obtained from the patient after a discussion of the risks, benefits and alternatives to treatment. A timeout was performed prior to the initiation of the procedure. Initial ultrasound scanning demonstrates a moderate amount of ascites within the abdomen . The right lateral abdomen was prepped and draped in the usual sterile fashion. 1% lidocaine was used for local anesthesia. Following this, a Safe-T-Centesis catheter was introduced. An ultrasound image was saved for documentation purposes. The paracentesis was performed. The catheter was removed and a dressing  patient tolerated the procedure well without immediate post procedural complication. FINDINGS: A total of approximately 3.4 L of clear yellow fluid was removed. IMPRESSION: Successful ultrasound-guided paracentesis yielding 3.4 liters of peritoneal fluid. Electronically Signed   By: D  Hassell M.D.   On: 01/18/2019 15:38  ° ° °PERFORMANCE STATUS (ECOG) : 1 - Symptomatic but completely ambulatory ° °Review of Systems °As noted above. Otherwise, a complete review of systems is negative. ° °Physical Exam °General: NAD, frail appearing, thin °Cardiovascular: regular rate and rhythm °Pulmonary: clear ant fields °Abdomen: soft, nontender, + bowel sounds °GU: no suprapubic tenderness °Extremities: no edema, no joint deformities °Skin: no rashes °Neurological: Weakness but otherwise nonfocal ° °IMPRESSION: °I met with patient today in the clinic.   ° °Patient has had progressive decline over the last several weeks.  He is weaker and having more difficulty swallowing.  Symptoms are most likely in the setting of  disease progression.  Patient is pending PET scan for confirmation.  He is likely no longer a treatment candidate nor would he have viable treatment options left with stage IV pancreatic cancer.  Hospice has been discussed by Dr. Finnegan but patient was adamant that he did not want that. ° °I met today to discuss goals with patient.  Patient quickly told me that he was "not going to hospice."  I spent some time explaining the differences between residential hospice and hospice at home.  Patient said he needed to "think about it."  I do worry that he is home alone and has limited social support.  Patient has a girlfriend, Sandra, but she lives in a separate house and does not drive.  Patient says that his primary goal is to remain at home even if he were approaching end-of-life. ° °I discussed CODE STATUS with patient.  I explained the probable futility associated with aggressive measures in the setting of incurable and untreatable cancer.  He said that he was going to think about decisions and agreed that we could talk more when he returns next week. ° °With patient's permission, I called and updated Sandra.  She was tearful but seem to recognize that patient's prognosis is poor.  We discussed the option of hospice and she seemed to think it was a reasonable idea. ° °Case discussed with Dr. Finnegan. ° °PLAN: °Continue current scope of treatment °Continue oxycodone prn for pain °ACP documents reviewed/MOST form at next clinic visit °RTC next week ° °Patient expressed understanding and was in agreement with this plan. He also understands that He can call clinic at any time with any questions, concerns, or complaints.  ° ° ° °Time Total: 30 minutes ° °Visit consisted of counseling and education dealing with the complex and emotionally intense issues of symptom management and palliative care in the setting of serious and potentially life-threatening illness.Greater than 50%  of this time was spent counseling and  coordinating care related to the above assessment and plan. ° °Signed by: °Joshua Borders, PhD, NP-C °[phone] (Work Cell) °

## 2019-01-31 ENCOUNTER — Telehealth: Payer: Self-pay | Admitting: Oncology

## 2019-01-31 LAB — CANCER ANTIGEN 19-9: CA 19-9: 2954 U/mL — ABNORMAL HIGH (ref 0–35)

## 2019-02-01 ENCOUNTER — Ambulatory Visit: Payer: Medicaid Other

## 2019-02-05 ENCOUNTER — Ambulatory Visit: Payer: Medicaid Other

## 2019-02-06 ENCOUNTER — Other Ambulatory Visit: Payer: Medicaid Other

## 2019-02-06 ENCOUNTER — Ambulatory Visit: Payer: Medicaid Other

## 2019-02-06 ENCOUNTER — Ambulatory Visit: Payer: Medicaid Other | Admitting: Oncology

## 2019-02-06 ENCOUNTER — Encounter: Payer: Medicaid Other | Admitting: Hospice and Palliative Medicine

## 2019-02-27 NOTE — Telephone Encounter (Signed)
Called by [employer]. Patient cardiac arrested, passed away. Death Certificate will be sent to cancer center for signature.

## 2019-02-27 DEATH — deceased

## 2019-09-23 IMAGING — PT NM PET TUM IMG RESTAG (PS) SKULL BASE T - THIGH
9 series · 21 of 25 positions shown · non-contrast
Comparison: PET-CT 09/27/2016, CT 01/03/2017

CLINICAL DATA: Subsequent treatment strategy for pancreatic
carcinoma..

EXAM:
NUCLEAR MEDICINE PET SKULL BASE TO THIGH
TECHNIQUE: 12.9 mCi F-18 FDG was injected intravenously. Full-ring PET imaging
was performed from the skull base to thigh after the radiotracer. CT
data was obtained and used for attenuation correction and anatomic
localization.
FASTING BLOOD GLUCOSE:  Value: 99 mg/dl

[Series 3: ct wb 5.0 b30f · axial · 5.0mm · 0.98mm/px · z∈[-1414,-838]mm · 3 of 290 slices shown]
[im 1/290  brain]
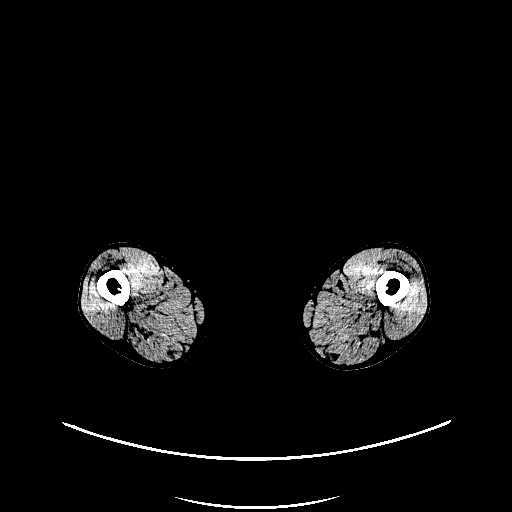
[im 97/290]
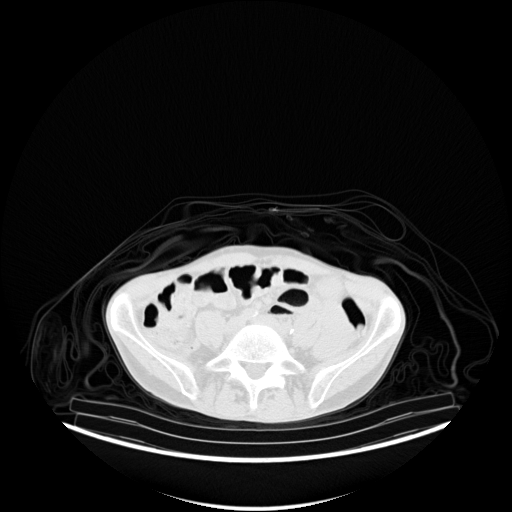
[im 193/290]
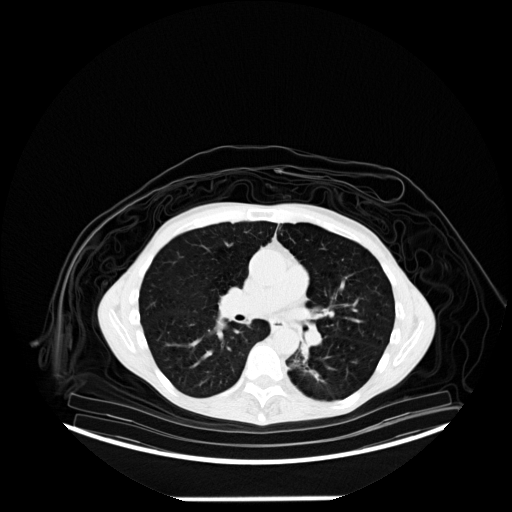

[Series 5: pet wb uncorrected (nac) · axial · 5.0mm · 4.07mm/px · z∈[-1414,-548]mm · 4 of 290 slices shown]
[im 1/290]
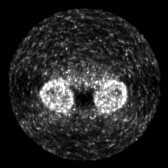
[im 97/290]
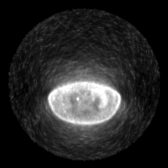
[im 193/290]
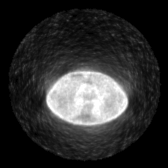
[im 290/290]
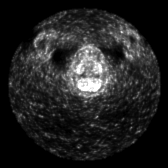

[Series 603: pet axial fused · 3 of 286 slices shown]
[im 1/286]
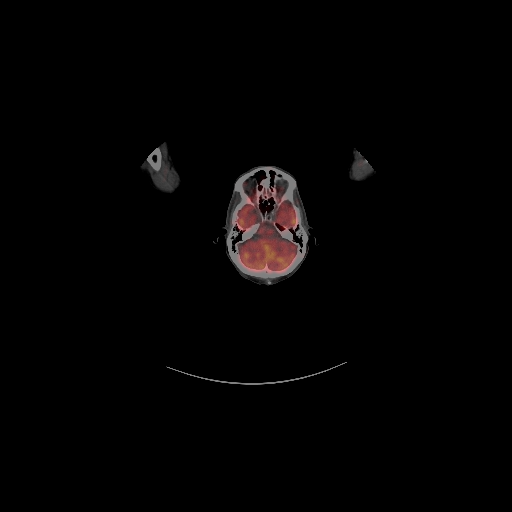
[im 191/286]
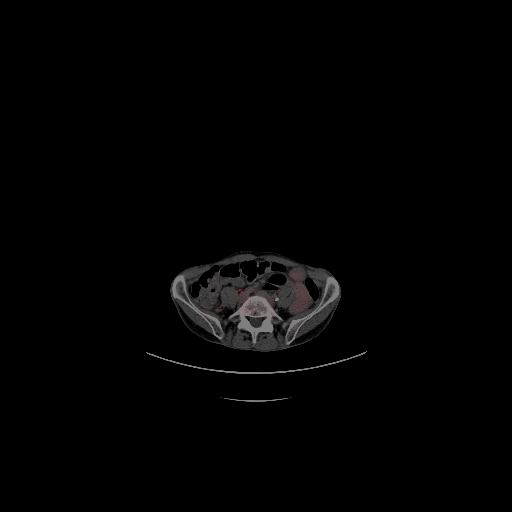
[im 286/286]
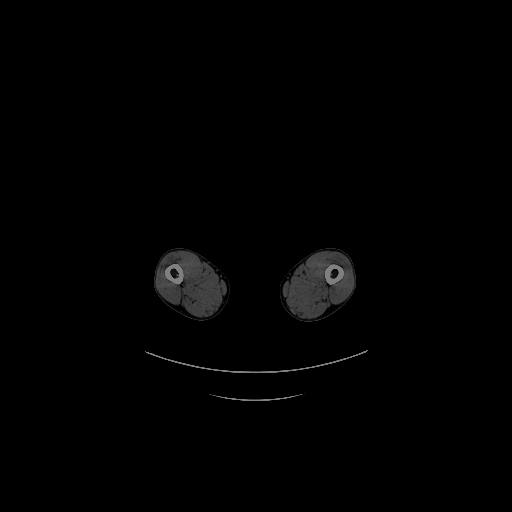

[Series 604: pet coronal fused · 2 of 111 slices shown]
[im 1/111]
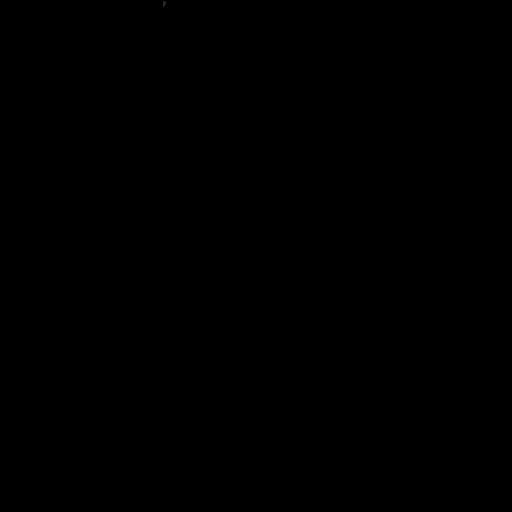
[im 111/111]
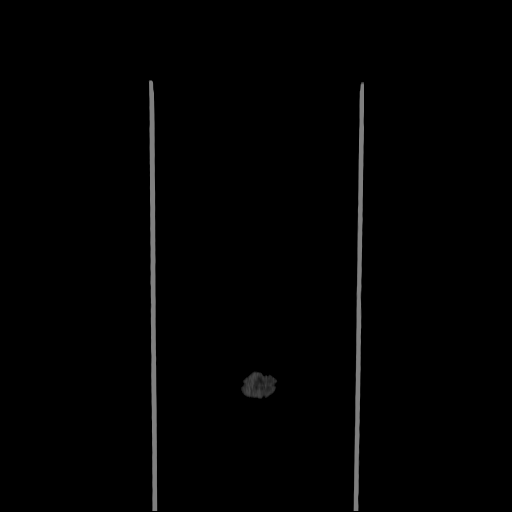

[Series 605: pet sagittal fused · 1 of 134 slices shown]
[im 1/134]
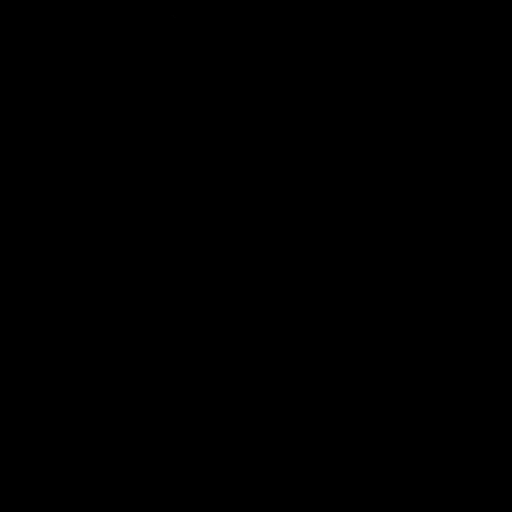

[Series 606: pet axial · 4 of 289 slices shown]
[im 1/289]
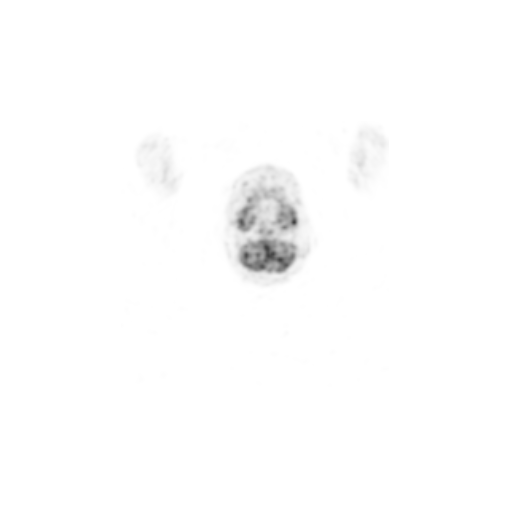
[im 97/289]
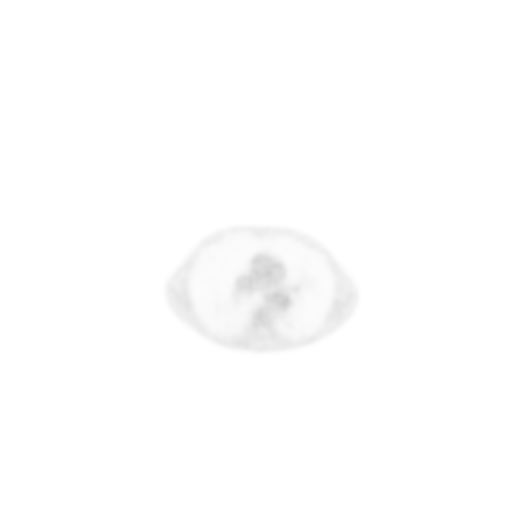
[im 193/289]
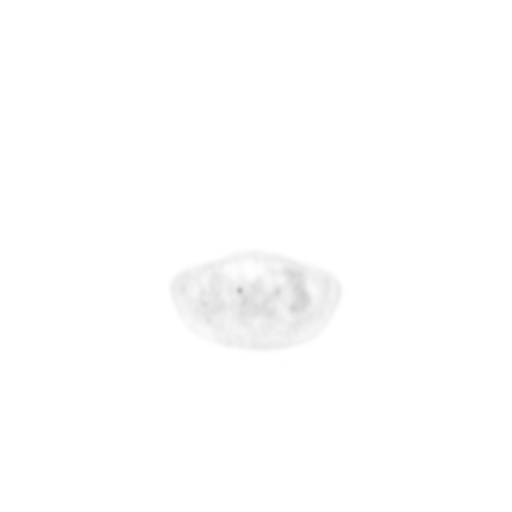
[im 289/289]
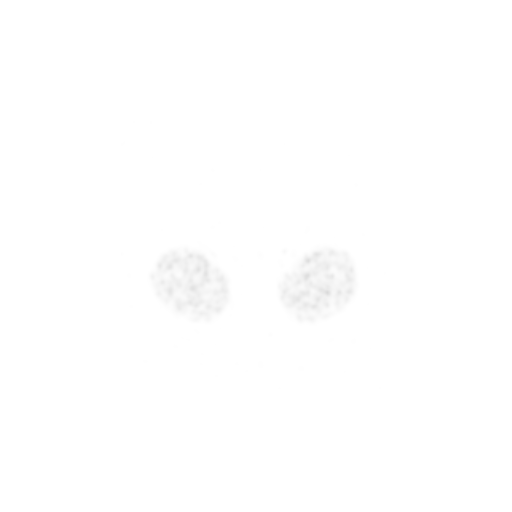

[Series 607: pet coronal · 1 of 135 slices shown]
[im 1/135]
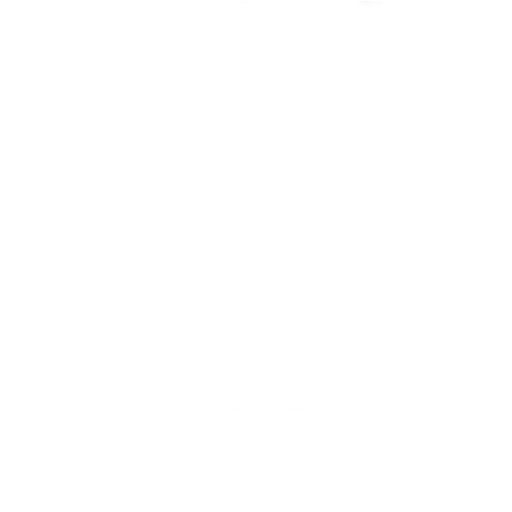

[Series 608: pet sagittal · 2 of 144 slices shown]
[im 1/144]
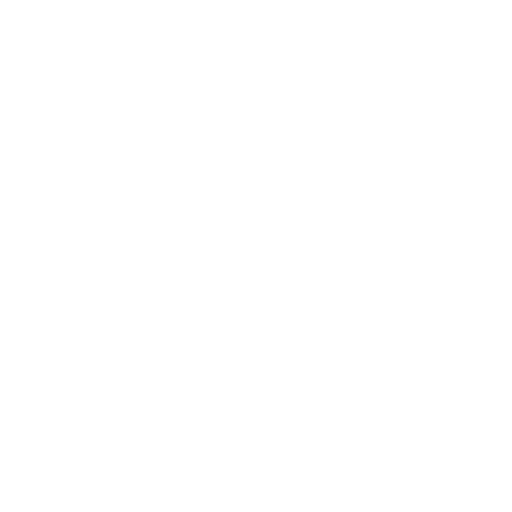
[im 144/144]
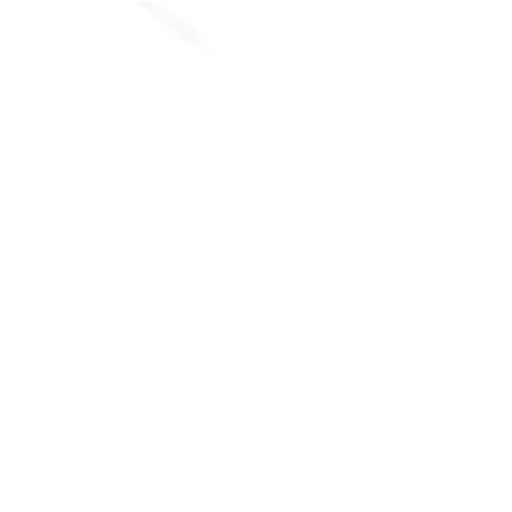

[Series 1133: results mm oncology reading · 1.26mm/px · 1 of 2 slices shown]
[im 1/2]
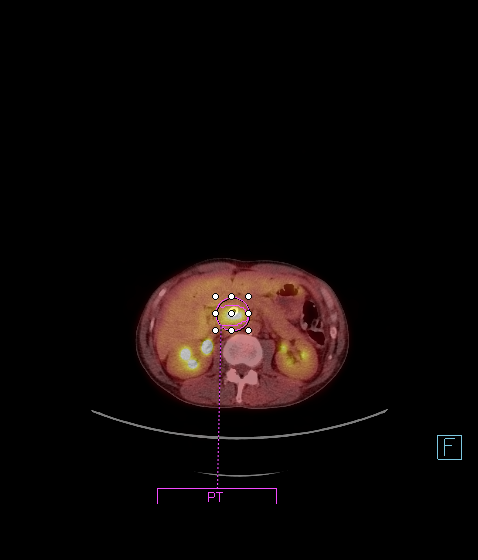

[21 of 25 positions shown; findings below may reference images not displayed]

FINDINGS: NECK

No hypermetabolic lymph nodes in the neck.

CHEST

No hypermetabolic mediastinal or hilar nodes. No suspicious
pulmonary nodules on the CT scan. Paraseptal emphysema at the
apices.

ABDOMEN/PELVIS

There remains intense metabolic activity through the head of the
pancreas along the common bile stent S with UV max 5.0 decreased
from SUV max 9.6. The size of the pancreatic lesions difficult to
ascertain on noncontrast

Clear reduction in metabolic activity hepatic metastasis. No clear
hypermetabolic activity above background. Extensive pneumobilia
noted. No duct dilatation

There is a rim metabolic activity associated gallbladder SUV max
2.9. This much improved from hypermetabolic mass along lateral
margin of gallbladder SUV 16.6.

No clear hypermetabolic adenopathy.  No new metastatic lesion

SKELETON

No focal hypermetabolic activity to suggest skeletal metastasis.
IMPRESSION: 1. Marked positive response to therapy with near complete resolution
metabolic activity within hepatic metastasis.
2. Marked in size and metabolic activity of lesion the lateral
margin the of the gallbladder.
3. Reduction in metabolic activity in the pancreatic head mass
lesion. Significant activity does remain but significantly reduced.
4. No evidence of new or progressive carcinoma.

## 2020-09-13 IMAGING — CT CT ABD-PELV W/ CM
2 of 5 series · 11 of 36 positions shown, 13 images · IV contrast (iopamidol)
Comparison: CT the chest, abdomen and pelvis 12/02/2017.

CLINICAL DATA: 57-year-old male with history of pancreatic cancer
originally diagnosed in September 2016, treated with chemotherapy.
Follow-up study.

EXAM:
CT CHEST, ABDOMEN, AND PELVIS WITH CONTRAST
TECHNIQUE: Multidetector CT imaging of the chest, abdomen and pelvis was
performed following the standard protocol during bolus
administration of intravenous contrast.
CONTRAST:  75mL [id number] IOPAMIDOL ([id number]) INJECTION 61%

[Series 2: axials cap · axial · 0.62mm/px · z∈[-1651,-1101]mm · 8 of 138 slices shown, 10 images]
[im 14/138  mediastinal]
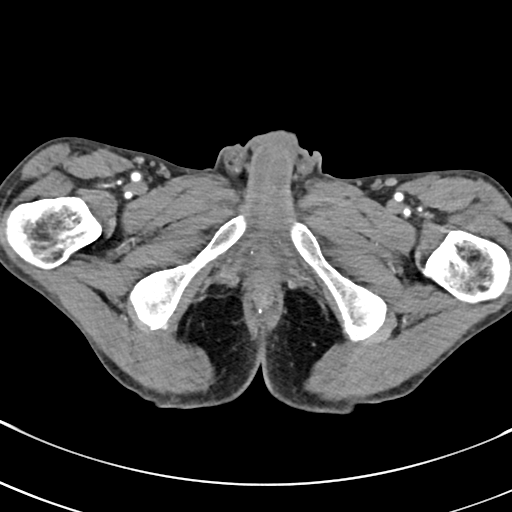
[im 14/138  lung]
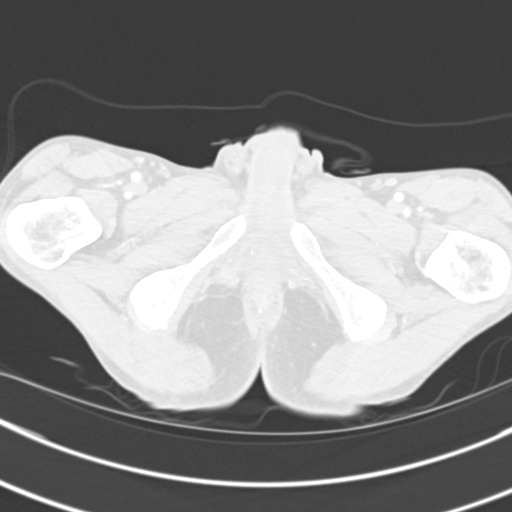
[im 28/138  lung]
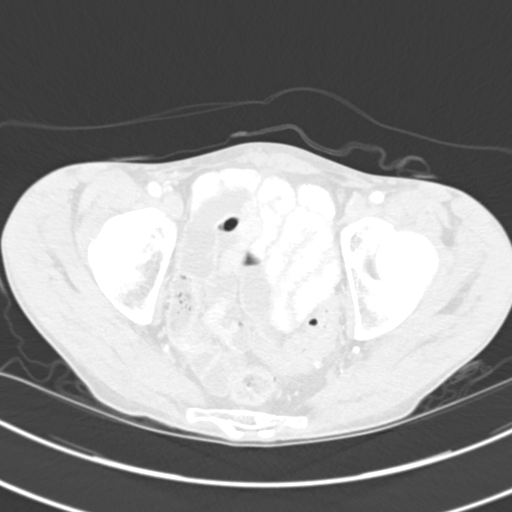
[im 42/138  lung]
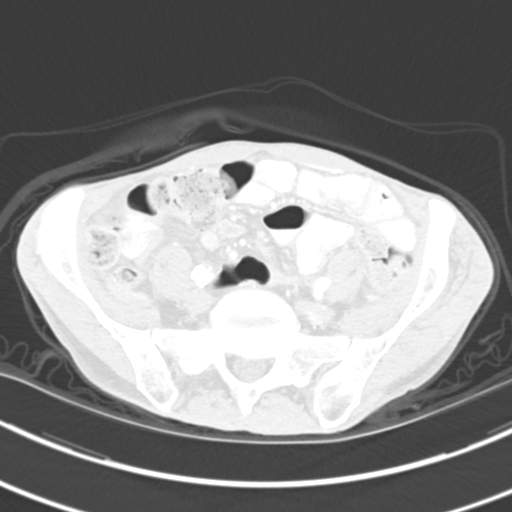
[im 55/138  lung]
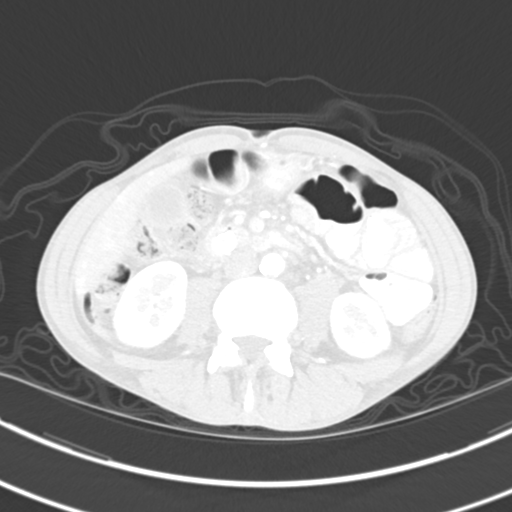
[im 83/138  mediastinal]
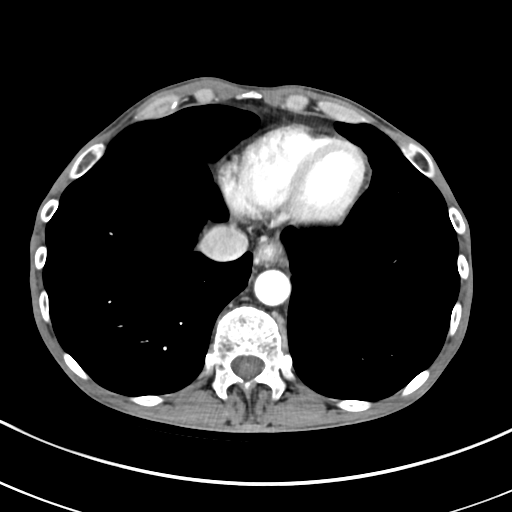
[im 83/138  lung]
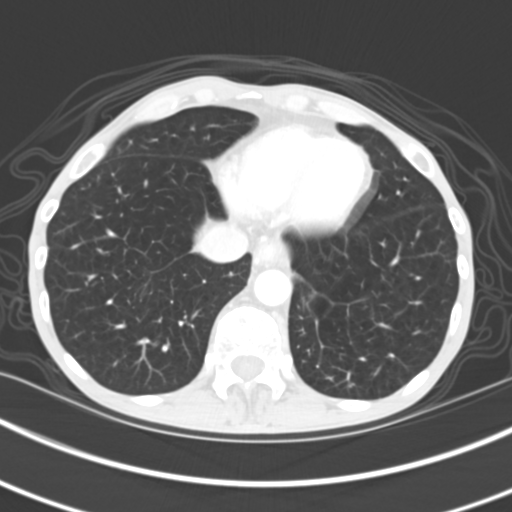
[im 96/138  lung]
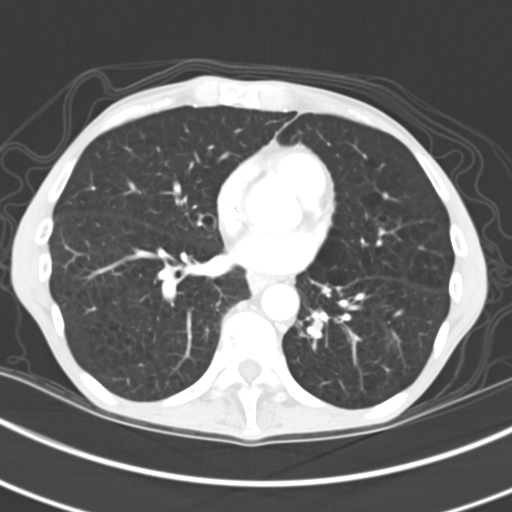
[im 110/138  lung]
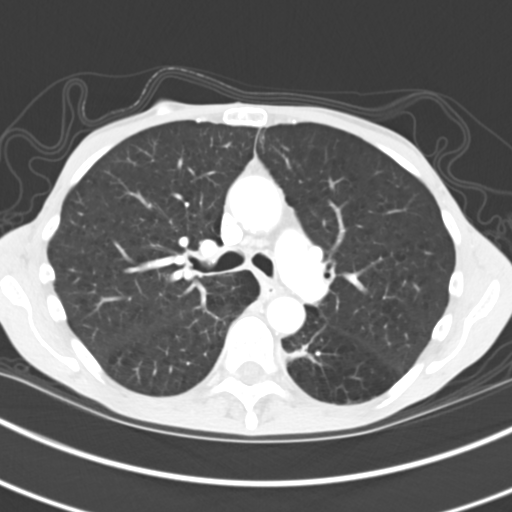
[im 124/138  lung]
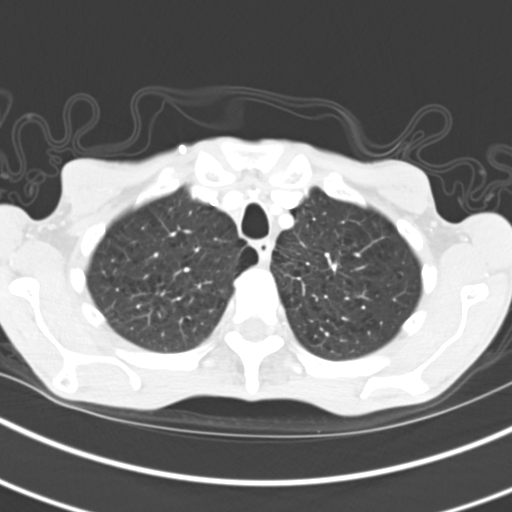

[Series 5: coronals cap · coronal · 0.62mm/px · 3 of 124 slices shown]
[im 25/124  lung]
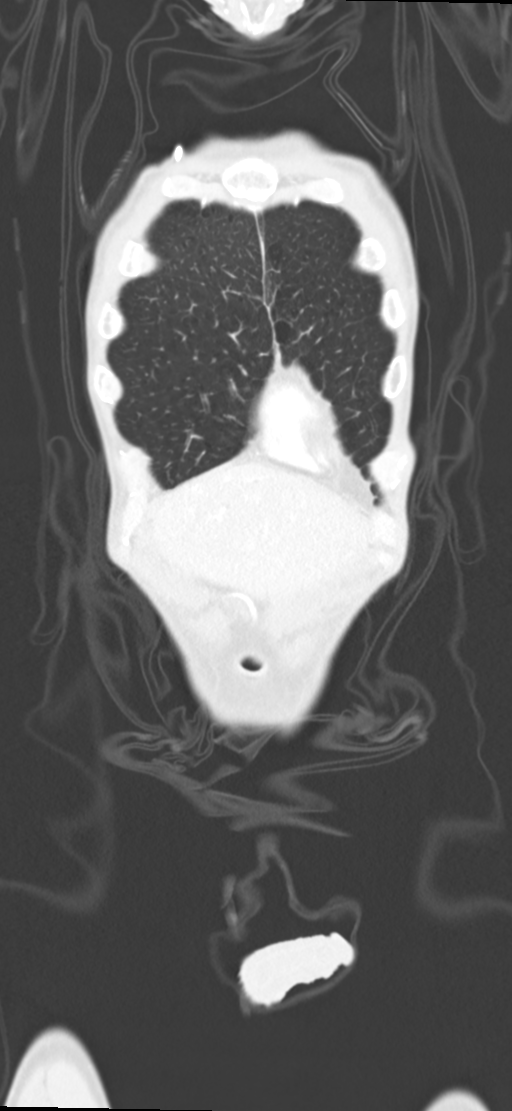
[im 50/124  lung]
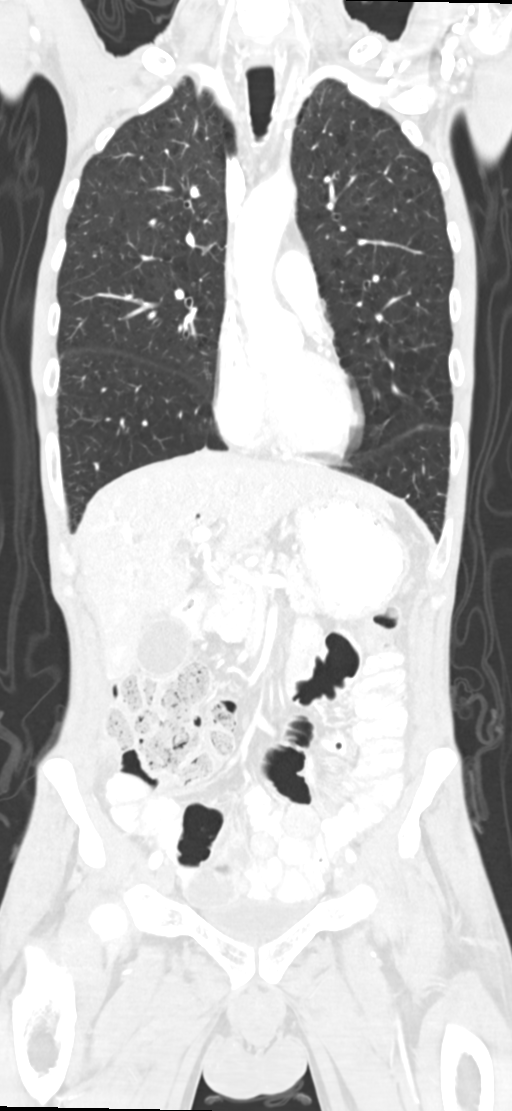
[im 74/124  lung]
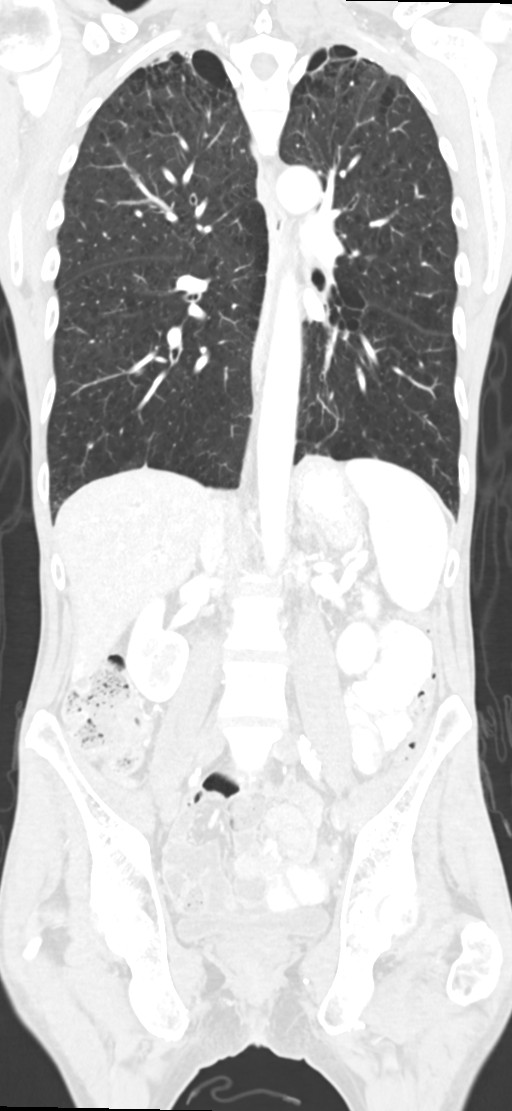

[11 of 36 positions shown; findings below may reference images not displayed]

FINDINGS: CT CHEST FINDINGS

Cardiovascular: Heart size is normal. There is no significant
pericardial fluid, thickening or pericardial calcification. Aortic
atherosclerosis. No definite coronary artery calcifications. Right
internal jugular single-lumen porta cath with tip terminating in the
right atrium.

Mediastinum/Nodes: No pathologically enlarged mediastinal or hilar
lymph nodes. Esophagus is unremarkable in appearance. No axillary
lymphadenopathy.

Lungs/Pleura: 3 mm nodule in the anterior aspect of the right lower
lobe (axial image 110 of series 4), decreased slightly compared to
the prior study. 5 mm nodule in the medial aspect of the left lower
lobe (axial image 130 of series 4), stable compared to the prior
examination. There continues to be areas of chronic mucoid impaction
within left lower lobe bronchi with some associated surrounding
scarring. Scarring is most evident in the superior segment of the
left lower lobe where there is a pleural-based area of architectural
distortion measuring 7 x 14 mm (axial image 71 of series 4), stable
compared to prior examinations (including prior PET-CT 04/12/2017,
at which time this area was not hypermetabolic). No other new
suspicious appearing pulmonary nodules or masses are noted. No acute
consolidative airspace disease. No pleural effusions. Diffuse
bronchial wall thickening with moderate centrilobular and paraseptal
emphysema.

Musculoskeletal: There are no aggressive appearing lytic or blastic
lesions noted in the visualized portions of the skeleton.

CT ABDOMEN PELVIS FINDINGS

Hepatobiliary: No suspicious cystic or solid hepatic lesions. Mild
intrahepatic biliary ductal dilatation, similar to the prior study.
Small amount of pneumobilia. Common bile duct stent in position with
tip extending into the duodenum. Gallbladder is unremarkable in
appearance.

Pancreas: Previously noted hypovascular lesion in the head and
uncinate process of the pancreas appears slightly larger than the
prior study measuring 3.2 x 3.3 x 4.1 cm (axial image 76 of series 2
and sagittal image 78 of series 7). This lesion is intimately
associated with the distal aspect of the common bile duct (stented).
The lesion comes in contact with the superior mesenteric artery
encasing the vessel from approximately [DATE] to [DATE] along its right
margin. Lesion also comes in contact with the distal aspect of the
superior mesenteric vein, partially obliterating the intervening fat
plane. The undersurface of the lesion comes in contact with the
splenic vein and splenoportal confluence. The posterior aspect of
the lesion also comes in broad contact with the IVC and infrarenal
abdominal aorta, obliterating the intervening fat planes. This is
associated with some pancreatic ductal ectasia up to 3 mm in the
body of the pancreas. Body and tail of the pancreas are otherwise
unremarkable in appearance.

Spleen: Normal in size and unremarkable in appearance.

Adrenals/Urinary Tract: Bilateral kidneys and bilateral adrenal
glands are normal in appearance. No hydroureteronephrosis. Urinary
bladder is normal in appearance.

Stomach/Bowel: Normal appearance of the stomach. No pathologic
dilatation of small bowel or colon. Numerous colonic diverticulae
are noted, particularly in the sigmoid colon, without surrounding
inflammatory changes to suggest an acute diverticulitis at this
time. The appendix is not confidently identified and may be
surgically absent. Regardless, there are no inflammatory changes
noted adjacent to the cecum to suggest the presence of an acute
appendicitis at this time.

Vascular/Lymphatic: Aortic atherosclerosis, without evidence of
aneurysm or dissection in the abdominal or pelvic vasculature.
Borderline enlarged portacaval lymph node measuring 1.2 cm in short
axis, stable compared to the prior examination. No other definite
pathologically enlarged lymph nodes are noted elsewhere in the
abdomen or pelvis.

Reproductive: Prostate gland and seminal vesicles are unremarkable
in appearance.

Other: No significant volume of ascites.  No pneumoperitoneum.

Musculoskeletal: There are no aggressive appearing lytic or blastic
lesions noted in the visualized portions of the skeleton.
IMPRESSION: 1. Mild enlargement of the lesion in the pancreatic head, indicative
of slight progression of disease. The lesion currently measures
x 3.3 x 4.1 cm.
2. Stable borderline enlarged portacaval lymph node measuring 1.2 cm
in short axis.
3. Stable small pulmonary nodules and areas of scarring in the
lungs, as detailed above. Continued attention on any future routine
follow-up studies is recommended.
4. Aortic atherosclerosis.
5. Colonic diverticulosis without evidence to suggest an acute
diverticulitis at this time.
6. Additional incidental findings, similar to prior studies, as
above.

## 2021-04-10 NOTE — Telephone Encounter (Signed)
noted
